# Patient Record
Sex: Female | Born: 1945 | Race: White | Hispanic: No | Marital: Married | Smoking: Never smoker
Health system: Southern US, Community
[De-identification: ages and names within clinical notes are randomized; demographics above are authoritative.]

## PROBLEM LIST (undated history)

## (undated) DIAGNOSIS — T8482XA Fibrosis due to internal orthopedic prosthetic devices, implants and grafts, initial encounter: Secondary | ICD-10-CM

## (undated) DIAGNOSIS — G4733 Obstructive sleep apnea (adult) (pediatric): Secondary | ICD-10-CM

## (undated) DIAGNOSIS — C801 Malignant (primary) neoplasm, unspecified: Secondary | ICD-10-CM

## (undated) DIAGNOSIS — Z7901 Long term (current) use of anticoagulants: Secondary | ICD-10-CM

## (undated) DIAGNOSIS — I259 Chronic ischemic heart disease, unspecified: Secondary | ICD-10-CM

## (undated) DIAGNOSIS — N184 Chronic kidney disease, stage 4 (severe): Secondary | ICD-10-CM

## (undated) DIAGNOSIS — R32 Unspecified urinary incontinence: Secondary | ICD-10-CM

## (undated) DIAGNOSIS — M8580 Other specified disorders of bone density and structure, unspecified site: Secondary | ICD-10-CM

## (undated) DIAGNOSIS — B369 Superficial mycosis, unspecified: Secondary | ICD-10-CM

## (undated) DIAGNOSIS — E785 Hyperlipidemia, unspecified: Secondary | ICD-10-CM

## (undated) DIAGNOSIS — R0600 Dyspnea, unspecified: Secondary | ICD-10-CM

## (undated) DIAGNOSIS — M542 Cervicalgia: Secondary | ICD-10-CM

## (undated) DIAGNOSIS — Z923 Personal history of irradiation: Secondary | ICD-10-CM

## (undated) DIAGNOSIS — C4491 Basal cell carcinoma of skin, unspecified: Secondary | ICD-10-CM

## (undated) DIAGNOSIS — R87610 Atypical squamous cells of undetermined significance on cytologic smear of cervix (ASC-US): Secondary | ICD-10-CM

## (undated) DIAGNOSIS — I447 Left bundle-branch block, unspecified: Secondary | ICD-10-CM

## (undated) DIAGNOSIS — A938 Other specified arthropod-borne viral fevers: Secondary | ICD-10-CM

## (undated) DIAGNOSIS — I700 Atherosclerosis of aorta: Secondary | ICD-10-CM

## (undated) DIAGNOSIS — G4730 Sleep apnea, unspecified: Secondary | ICD-10-CM

## (undated) DIAGNOSIS — E119 Type 2 diabetes mellitus without complications: Secondary | ICD-10-CM

## (undated) DIAGNOSIS — R001 Bradycardia, unspecified: Secondary | ICD-10-CM

## (undated) DIAGNOSIS — M1990 Unspecified osteoarthritis, unspecified site: Secondary | ICD-10-CM

## (undated) DIAGNOSIS — I2510 Atherosclerotic heart disease of native coronary artery without angina pectoris: Secondary | ICD-10-CM

## (undated) DIAGNOSIS — Z79811 Long term (current) use of aromatase inhibitors: Secondary | ICD-10-CM

## (undated) DIAGNOSIS — C50919 Malignant neoplasm of unspecified site of unspecified female breast: Secondary | ICD-10-CM

## (undated) DIAGNOSIS — I480 Paroxysmal atrial fibrillation: Secondary | ICD-10-CM

## (undated) DIAGNOSIS — I10 Essential (primary) hypertension: Secondary | ICD-10-CM

## (undated) HISTORY — PX: BREAST SURGERY: SHX581

## (undated) HISTORY — DX: Essential (primary) hypertension: I10

## (undated) HISTORY — DX: Type 2 diabetes mellitus without complications: E11.9

## (undated) HISTORY — DX: Malignant (primary) neoplasm, unspecified: C80.1

---

## 2010-03-16 HISTORY — PX: OTHER SURGICAL HISTORY: SHX169

## 2010-03-16 HISTORY — PX: CORONARY ANGIOPLASTY WITH STENT PLACEMENT: SHX49

## 2010-03-16 HISTORY — PX: CORONARY ANGIOPLASTY: SHX604

## 2015-04-04 DIAGNOSIS — N72 Inflammatory disease of cervix uteri: Secondary | ICD-10-CM | POA: Diagnosis not present

## 2015-04-04 DIAGNOSIS — B977 Papillomavirus as the cause of diseases classified elsewhere: Secondary | ICD-10-CM | POA: Diagnosis not present

## 2015-04-04 DIAGNOSIS — N882 Stricture and stenosis of cervix uteri: Secondary | ICD-10-CM | POA: Diagnosis not present

## 2015-04-04 DIAGNOSIS — R87810 Cervical high risk human papillomavirus (HPV) DNA test positive: Secondary | ICD-10-CM | POA: Diagnosis not present

## 2015-04-04 DIAGNOSIS — R87610 Atypical squamous cells of undetermined significance on cytologic smear of cervix (ASC-US): Secondary | ICD-10-CM | POA: Diagnosis not present

## 2015-04-15 DIAGNOSIS — G4733 Obstructive sleep apnea (adult) (pediatric): Secondary | ICD-10-CM | POA: Insufficient documentation

## 2015-04-15 DIAGNOSIS — E1122 Type 2 diabetes mellitus with diabetic chronic kidney disease: Secondary | ICD-10-CM

## 2015-04-15 DIAGNOSIS — E119 Type 2 diabetes mellitus without complications: Secondary | ICD-10-CM | POA: Insufficient documentation

## 2015-04-15 DIAGNOSIS — Z0000 Encounter for general adult medical examination without abnormal findings: Secondary | ICD-10-CM | POA: Diagnosis not present

## 2015-04-15 DIAGNOSIS — E7800 Pure hypercholesterolemia, unspecified: Secondary | ICD-10-CM | POA: Insufficient documentation

## 2015-04-15 DIAGNOSIS — I10 Essential (primary) hypertension: Secondary | ICD-10-CM | POA: Diagnosis not present

## 2015-04-15 DIAGNOSIS — I25119 Atherosclerotic heart disease of native coronary artery with unspecified angina pectoris: Secondary | ICD-10-CM | POA: Diagnosis not present

## 2015-04-15 DIAGNOSIS — M25562 Pain in left knee: Secondary | ICD-10-CM | POA: Diagnosis not present

## 2015-04-15 DIAGNOSIS — Z6841 Body Mass Index (BMI) 40.0 and over, adult: Secondary | ICD-10-CM | POA: Diagnosis not present

## 2015-04-18 DIAGNOSIS — E119 Type 2 diabetes mellitus without complications: Secondary | ICD-10-CM | POA: Diagnosis not present

## 2015-04-18 DIAGNOSIS — H5213 Myopia, bilateral: Secondary | ICD-10-CM | POA: Diagnosis not present

## 2015-04-18 DIAGNOSIS — H25013 Cortical age-related cataract, bilateral: Secondary | ICD-10-CM | POA: Diagnosis not present

## 2015-04-19 DIAGNOSIS — I10 Essential (primary) hypertension: Secondary | ICD-10-CM | POA: Diagnosis not present

## 2015-04-19 DIAGNOSIS — E7800 Pure hypercholesterolemia, unspecified: Secondary | ICD-10-CM | POA: Diagnosis not present

## 2015-04-19 DIAGNOSIS — E119 Type 2 diabetes mellitus without complications: Secondary | ICD-10-CM | POA: Diagnosis not present

## 2015-05-06 DIAGNOSIS — M1712 Unilateral primary osteoarthritis, left knee: Secondary | ICD-10-CM | POA: Diagnosis not present

## 2015-05-06 DIAGNOSIS — M25562 Pain in left knee: Secondary | ICD-10-CM | POA: Diagnosis not present

## 2015-06-12 DIAGNOSIS — G4733 Obstructive sleep apnea (adult) (pediatric): Secondary | ICD-10-CM | POA: Diagnosis not present

## 2015-08-13 DIAGNOSIS — E7800 Pure hypercholesterolemia, unspecified: Secondary | ICD-10-CM | POA: Diagnosis not present

## 2015-08-13 DIAGNOSIS — Z9989 Dependence on other enabling machines and devices: Secondary | ICD-10-CM | POA: Diagnosis not present

## 2015-08-13 DIAGNOSIS — G4733 Obstructive sleep apnea (adult) (pediatric): Secondary | ICD-10-CM | POA: Diagnosis not present

## 2015-08-13 DIAGNOSIS — I10 Essential (primary) hypertension: Secondary | ICD-10-CM | POA: Diagnosis not present

## 2015-08-13 DIAGNOSIS — E119 Type 2 diabetes mellitus without complications: Secondary | ICD-10-CM | POA: Diagnosis not present

## 2015-08-13 DIAGNOSIS — I25119 Atherosclerotic heart disease of native coronary artery with unspecified angina pectoris: Secondary | ICD-10-CM | POA: Diagnosis not present

## 2015-08-13 DIAGNOSIS — Z6841 Body Mass Index (BMI) 40.0 and over, adult: Secondary | ICD-10-CM | POA: Diagnosis not present

## 2015-08-20 ENCOUNTER — Other Ambulatory Visit: Payer: Self-pay | Admitting: Obstetrics and Gynecology

## 2015-08-20 DIAGNOSIS — Z1231 Encounter for screening mammogram for malignant neoplasm of breast: Secondary | ICD-10-CM

## 2015-08-20 DIAGNOSIS — N9089 Other specified noninflammatory disorders of vulva and perineum: Secondary | ICD-10-CM | POA: Diagnosis not present

## 2015-09-06 ENCOUNTER — Ambulatory Visit: Payer: Self-pay

## 2015-09-23 ENCOUNTER — Ambulatory Visit
Admission: RE | Admit: 2015-09-23 | Discharge: 2015-09-23 | Disposition: A | Discharge status: Home / Self Care | Payer: PPO | Source: Ambulatory Visit | Attending: Obstetrics and Gynecology | Admitting: Obstetrics and Gynecology

## 2015-09-23 ENCOUNTER — Other Ambulatory Visit: Payer: Self-pay | Admitting: Obstetrics and Gynecology

## 2015-09-23 DIAGNOSIS — Z1231 Encounter for screening mammogram for malignant neoplasm of breast: Secondary | ICD-10-CM

## 2015-09-24 DIAGNOSIS — R87810 Cervical high risk human papillomavirus (HPV) DNA test positive: Secondary | ICD-10-CM | POA: Diagnosis not present

## 2015-09-24 DIAGNOSIS — N9089 Other specified noninflammatory disorders of vulva and perineum: Secondary | ICD-10-CM | POA: Diagnosis not present

## 2015-11-26 DIAGNOSIS — L570 Actinic keratosis: Secondary | ICD-10-CM | POA: Diagnosis not present

## 2015-11-26 DIAGNOSIS — L821 Other seborrheic keratosis: Secondary | ICD-10-CM | POA: Diagnosis not present

## 2015-12-04 DIAGNOSIS — E7800 Pure hypercholesterolemia, unspecified: Secondary | ICD-10-CM | POA: Diagnosis not present

## 2015-12-04 DIAGNOSIS — E119 Type 2 diabetes mellitus without complications: Secondary | ICD-10-CM | POA: Diagnosis not present

## 2015-12-11 DIAGNOSIS — G4733 Obstructive sleep apnea (adult) (pediatric): Secondary | ICD-10-CM | POA: Diagnosis not present

## 2015-12-11 DIAGNOSIS — E7800 Pure hypercholesterolemia, unspecified: Secondary | ICD-10-CM | POA: Diagnosis not present

## 2015-12-11 DIAGNOSIS — I25119 Atherosclerotic heart disease of native coronary artery with unspecified angina pectoris: Secondary | ICD-10-CM | POA: Diagnosis not present

## 2015-12-11 DIAGNOSIS — E119 Type 2 diabetes mellitus without complications: Secondary | ICD-10-CM | POA: Diagnosis not present

## 2015-12-11 DIAGNOSIS — Z6841 Body Mass Index (BMI) 40.0 and over, adult: Secondary | ICD-10-CM | POA: Diagnosis not present

## 2015-12-11 DIAGNOSIS — Z9989 Dependence on other enabling machines and devices: Secondary | ICD-10-CM | POA: Diagnosis not present

## 2015-12-11 DIAGNOSIS — Z23 Encounter for immunization: Secondary | ICD-10-CM | POA: Diagnosis not present

## 2015-12-11 DIAGNOSIS — I10 Essential (primary) hypertension: Secondary | ICD-10-CM | POA: Diagnosis not present

## 2015-12-13 DIAGNOSIS — M1712 Unilateral primary osteoarthritis, left knee: Secondary | ICD-10-CM | POA: Diagnosis not present

## 2015-12-18 DIAGNOSIS — L570 Actinic keratosis: Secondary | ICD-10-CM | POA: Diagnosis not present

## 2015-12-18 DIAGNOSIS — X32XXXA Exposure to sunlight, initial encounter: Secondary | ICD-10-CM | POA: Diagnosis not present

## 2015-12-31 DIAGNOSIS — G4733 Obstructive sleep apnea (adult) (pediatric): Secondary | ICD-10-CM | POA: Diagnosis not present

## 2016-01-15 DIAGNOSIS — L570 Actinic keratosis: Secondary | ICD-10-CM | POA: Diagnosis not present

## 2016-01-15 DIAGNOSIS — X32XXXA Exposure to sunlight, initial encounter: Secondary | ICD-10-CM | POA: Diagnosis not present

## 2016-03-05 DIAGNOSIS — I10 Essential (primary) hypertension: Secondary | ICD-10-CM | POA: Diagnosis not present

## 2016-03-05 DIAGNOSIS — G4733 Obstructive sleep apnea (adult) (pediatric): Secondary | ICD-10-CM | POA: Diagnosis not present

## 2016-03-05 DIAGNOSIS — Z9989 Dependence on other enabling machines and devices: Secondary | ICD-10-CM | POA: Diagnosis not present

## 2016-04-20 DIAGNOSIS — G4733 Obstructive sleep apnea (adult) (pediatric): Secondary | ICD-10-CM | POA: Diagnosis not present

## 2016-04-23 DIAGNOSIS — E119 Type 2 diabetes mellitus without complications: Secondary | ICD-10-CM | POA: Diagnosis not present

## 2016-04-23 DIAGNOSIS — H04123 Dry eye syndrome of bilateral lacrimal glands: Secondary | ICD-10-CM | POA: Diagnosis not present

## 2016-04-23 DIAGNOSIS — E089 Diabetes mellitus due to underlying condition without complications: Secondary | ICD-10-CM | POA: Diagnosis not present

## 2016-04-23 DIAGNOSIS — H2513 Age-related nuclear cataract, bilateral: Secondary | ICD-10-CM | POA: Diagnosis not present

## 2016-04-28 DIAGNOSIS — D0461 Carcinoma in situ of skin of right upper limb, including shoulder: Secondary | ICD-10-CM | POA: Diagnosis not present

## 2016-04-28 DIAGNOSIS — L821 Other seborrheic keratosis: Secondary | ICD-10-CM | POA: Diagnosis not present

## 2016-04-28 DIAGNOSIS — I25119 Atherosclerotic heart disease of native coronary artery with unspecified angina pectoris: Secondary | ICD-10-CM | POA: Diagnosis not present

## 2016-04-28 DIAGNOSIS — D485 Neoplasm of uncertain behavior of skin: Secondary | ICD-10-CM | POA: Diagnosis not present

## 2016-04-28 DIAGNOSIS — E7800 Pure hypercholesterolemia, unspecified: Secondary | ICD-10-CM | POA: Diagnosis not present

## 2016-04-28 DIAGNOSIS — L570 Actinic keratosis: Secondary | ICD-10-CM | POA: Diagnosis not present

## 2016-04-28 DIAGNOSIS — E119 Type 2 diabetes mellitus without complications: Secondary | ICD-10-CM | POA: Diagnosis not present

## 2016-04-28 DIAGNOSIS — X32XXXA Exposure to sunlight, initial encounter: Secondary | ICD-10-CM | POA: Diagnosis not present

## 2016-05-05 DIAGNOSIS — E119 Type 2 diabetes mellitus without complications: Secondary | ICD-10-CM | POA: Diagnosis not present

## 2016-05-05 DIAGNOSIS — I10 Essential (primary) hypertension: Secondary | ICD-10-CM | POA: Diagnosis not present

## 2016-05-05 DIAGNOSIS — E7800 Pure hypercholesterolemia, unspecified: Secondary | ICD-10-CM | POA: Diagnosis not present

## 2016-05-05 DIAGNOSIS — G4733 Obstructive sleep apnea (adult) (pediatric): Secondary | ICD-10-CM | POA: Diagnosis not present

## 2016-05-05 DIAGNOSIS — Z9989 Dependence on other enabling machines and devices: Secondary | ICD-10-CM | POA: Diagnosis not present

## 2016-05-05 DIAGNOSIS — I25119 Atherosclerotic heart disease of native coronary artery with unspecified angina pectoris: Secondary | ICD-10-CM | POA: Diagnosis not present

## 2016-05-05 DIAGNOSIS — Z6841 Body Mass Index (BMI) 40.0 and over, adult: Secondary | ICD-10-CM | POA: Diagnosis not present

## 2016-05-09 DIAGNOSIS — G4733 Obstructive sleep apnea (adult) (pediatric): Secondary | ICD-10-CM | POA: Diagnosis not present

## 2016-05-11 DIAGNOSIS — D0461 Carcinoma in situ of skin of right upper limb, including shoulder: Secondary | ICD-10-CM | POA: Diagnosis not present

## 2016-05-11 DIAGNOSIS — C44622 Squamous cell carcinoma of skin of right upper limb, including shoulder: Secondary | ICD-10-CM | POA: Diagnosis not present

## 2016-06-09 DIAGNOSIS — G4733 Obstructive sleep apnea (adult) (pediatric): Secondary | ICD-10-CM | POA: Diagnosis not present

## 2016-06-09 DIAGNOSIS — I10 Essential (primary) hypertension: Secondary | ICD-10-CM | POA: Diagnosis not present

## 2016-07-09 DIAGNOSIS — G4733 Obstructive sleep apnea (adult) (pediatric): Secondary | ICD-10-CM | POA: Diagnosis not present

## 2016-07-09 DIAGNOSIS — I10 Essential (primary) hypertension: Secondary | ICD-10-CM | POA: Diagnosis not present

## 2016-07-10 DIAGNOSIS — Z9989 Dependence on other enabling machines and devices: Secondary | ICD-10-CM | POA: Diagnosis not present

## 2016-07-10 DIAGNOSIS — I10 Essential (primary) hypertension: Secondary | ICD-10-CM | POA: Diagnosis not present

## 2016-07-10 DIAGNOSIS — G4733 Obstructive sleep apnea (adult) (pediatric): Secondary | ICD-10-CM | POA: Diagnosis not present

## 2016-07-31 DIAGNOSIS — E119 Type 2 diabetes mellitus without complications: Secondary | ICD-10-CM | POA: Diagnosis not present

## 2016-07-31 DIAGNOSIS — N9089 Other specified noninflammatory disorders of vulva and perineum: Secondary | ICD-10-CM | POA: Diagnosis not present

## 2016-07-31 DIAGNOSIS — Z8739 Personal history of other diseases of the musculoskeletal system and connective tissue: Secondary | ICD-10-CM | POA: Diagnosis not present

## 2016-07-31 DIAGNOSIS — E7800 Pure hypercholesterolemia, unspecified: Secondary | ICD-10-CM | POA: Diagnosis not present

## 2016-07-31 DIAGNOSIS — I25119 Atherosclerotic heart disease of native coronary artery with unspecified angina pectoris: Secondary | ICD-10-CM | POA: Diagnosis not present

## 2016-07-31 DIAGNOSIS — L298 Other pruritus: Secondary | ICD-10-CM | POA: Diagnosis not present

## 2016-08-07 DIAGNOSIS — G4733 Obstructive sleep apnea (adult) (pediatric): Secondary | ICD-10-CM | POA: Diagnosis not present

## 2016-08-07 DIAGNOSIS — Z6841 Body Mass Index (BMI) 40.0 and over, adult: Secondary | ICD-10-CM | POA: Diagnosis not present

## 2016-08-07 DIAGNOSIS — E7800 Pure hypercholesterolemia, unspecified: Secondary | ICD-10-CM | POA: Diagnosis not present

## 2016-08-07 DIAGNOSIS — Z0000 Encounter for general adult medical examination without abnormal findings: Secondary | ICD-10-CM | POA: Diagnosis not present

## 2016-08-07 DIAGNOSIS — I25119 Atherosclerotic heart disease of native coronary artery with unspecified angina pectoris: Secondary | ICD-10-CM | POA: Diagnosis not present

## 2016-08-07 DIAGNOSIS — I10 Essential (primary) hypertension: Secondary | ICD-10-CM | POA: Diagnosis not present

## 2016-08-07 DIAGNOSIS — Z9989 Dependence on other enabling machines and devices: Secondary | ICD-10-CM | POA: Diagnosis not present

## 2016-08-07 DIAGNOSIS — E119 Type 2 diabetes mellitus without complications: Secondary | ICD-10-CM | POA: Diagnosis not present

## 2016-08-09 DIAGNOSIS — I10 Essential (primary) hypertension: Secondary | ICD-10-CM | POA: Diagnosis not present

## 2016-08-09 DIAGNOSIS — G4733 Obstructive sleep apnea (adult) (pediatric): Secondary | ICD-10-CM | POA: Diagnosis not present

## 2016-08-11 DIAGNOSIS — I10 Essential (primary) hypertension: Secondary | ICD-10-CM | POA: Diagnosis not present

## 2016-08-11 DIAGNOSIS — G4733 Obstructive sleep apnea (adult) (pediatric): Secondary | ICD-10-CM | POA: Diagnosis not present

## 2016-08-25 DIAGNOSIS — Z8739 Personal history of other diseases of the musculoskeletal system and connective tissue: Secondary | ICD-10-CM | POA: Diagnosis not present

## 2016-08-25 DIAGNOSIS — M8588 Other specified disorders of bone density and structure, other site: Secondary | ICD-10-CM | POA: Diagnosis not present

## 2016-09-09 DIAGNOSIS — I10 Essential (primary) hypertension: Secondary | ICD-10-CM | POA: Diagnosis not present

## 2016-09-09 DIAGNOSIS — G4733 Obstructive sleep apnea (adult) (pediatric): Secondary | ICD-10-CM | POA: Diagnosis not present

## 2016-09-10 DIAGNOSIS — G4733 Obstructive sleep apnea (adult) (pediatric): Secondary | ICD-10-CM | POA: Diagnosis not present

## 2016-09-10 DIAGNOSIS — I10 Essential (primary) hypertension: Secondary | ICD-10-CM | POA: Diagnosis not present

## 2016-09-30 ENCOUNTER — Other Ambulatory Visit: Payer: Self-pay | Admitting: Obstetrics and Gynecology

## 2016-09-30 DIAGNOSIS — Z1231 Encounter for screening mammogram for malignant neoplasm of breast: Secondary | ICD-10-CM

## 2016-10-08 DIAGNOSIS — Z85828 Personal history of other malignant neoplasm of skin: Secondary | ICD-10-CM | POA: Diagnosis not present

## 2016-10-08 DIAGNOSIS — D485 Neoplasm of uncertain behavior of skin: Secondary | ICD-10-CM | POA: Diagnosis not present

## 2016-10-08 DIAGNOSIS — D2261 Melanocytic nevi of right upper limb, including shoulder: Secondary | ICD-10-CM | POA: Diagnosis not present

## 2016-10-08 DIAGNOSIS — L570 Actinic keratosis: Secondary | ICD-10-CM | POA: Diagnosis not present

## 2016-10-08 DIAGNOSIS — L820 Inflamed seborrheic keratosis: Secondary | ICD-10-CM | POA: Diagnosis not present

## 2016-10-08 DIAGNOSIS — D2272 Melanocytic nevi of left lower limb, including hip: Secondary | ICD-10-CM | POA: Diagnosis not present

## 2016-10-08 DIAGNOSIS — X32XXXA Exposure to sunlight, initial encounter: Secondary | ICD-10-CM | POA: Diagnosis not present

## 2016-10-08 DIAGNOSIS — L821 Other seborrheic keratosis: Secondary | ICD-10-CM | POA: Diagnosis not present

## 2016-10-09 DIAGNOSIS — I10 Essential (primary) hypertension: Secondary | ICD-10-CM | POA: Diagnosis not present

## 2016-10-09 DIAGNOSIS — G4733 Obstructive sleep apnea (adult) (pediatric): Secondary | ICD-10-CM | POA: Diagnosis not present

## 2016-10-13 ENCOUNTER — Ambulatory Visit
Admission: RE | Admit: 2016-10-13 | Discharge: 2016-10-13 | Disposition: A | Discharge status: Home / Self Care | Payer: PPO | Source: Ambulatory Visit | Attending: Obstetrics and Gynecology | Admitting: Obstetrics and Gynecology

## 2016-10-13 DIAGNOSIS — Z1231 Encounter for screening mammogram for malignant neoplasm of breast: Secondary | ICD-10-CM | POA: Diagnosis not present

## 2016-10-13 DIAGNOSIS — R928 Other abnormal and inconclusive findings on diagnostic imaging of breast: Secondary | ICD-10-CM | POA: Insufficient documentation

## 2016-10-15 ENCOUNTER — Other Ambulatory Visit: Payer: Self-pay | Admitting: Obstetrics and Gynecology

## 2016-10-15 DIAGNOSIS — N6489 Other specified disorders of breast: Secondary | ICD-10-CM

## 2016-10-15 DIAGNOSIS — R928 Other abnormal and inconclusive findings on diagnostic imaging of breast: Secondary | ICD-10-CM

## 2016-10-29 DIAGNOSIS — M1712 Unilateral primary osteoarthritis, left knee: Secondary | ICD-10-CM | POA: Diagnosis not present

## 2016-10-30 ENCOUNTER — Other Ambulatory Visit: Payer: PPO

## 2016-10-30 ENCOUNTER — Other Ambulatory Visit: Payer: Self-pay | Admitting: Obstetrics and Gynecology

## 2016-10-30 ENCOUNTER — Ambulatory Visit: Payer: PPO | Attending: Obstetrics and Gynecology

## 2016-10-30 DIAGNOSIS — R928 Other abnormal and inconclusive findings on diagnostic imaging of breast: Secondary | ICD-10-CM

## 2016-10-30 DIAGNOSIS — N6489 Other specified disorders of breast: Secondary | ICD-10-CM

## 2016-11-05 DIAGNOSIS — I10 Essential (primary) hypertension: Secondary | ICD-10-CM | POA: Diagnosis not present

## 2016-11-05 DIAGNOSIS — G4733 Obstructive sleep apnea (adult) (pediatric): Secondary | ICD-10-CM | POA: Diagnosis not present

## 2016-11-09 DIAGNOSIS — I10 Essential (primary) hypertension: Secondary | ICD-10-CM | POA: Diagnosis not present

## 2016-11-09 DIAGNOSIS — G4733 Obstructive sleep apnea (adult) (pediatric): Secondary | ICD-10-CM | POA: Diagnosis not present

## 2016-11-10 ENCOUNTER — Ambulatory Visit
Admission: RE | Admit: 2016-11-10 | Discharge: 2016-11-10 | Disposition: A | Discharge status: Home / Self Care | Payer: PPO | Source: Ambulatory Visit | Attending: Obstetrics and Gynecology | Admitting: Obstetrics and Gynecology

## 2016-11-10 DIAGNOSIS — N6489 Other specified disorders of breast: Secondary | ICD-10-CM

## 2016-11-10 DIAGNOSIS — R928 Other abnormal and inconclusive findings on diagnostic imaging of breast: Secondary | ICD-10-CM

## 2016-11-10 DIAGNOSIS — R922 Inconclusive mammogram: Secondary | ICD-10-CM | POA: Diagnosis not present

## 2016-11-10 DIAGNOSIS — N6321 Unspecified lump in the left breast, upper outer quadrant: Secondary | ICD-10-CM | POA: Diagnosis not present

## 2016-11-12 ENCOUNTER — Other Ambulatory Visit: Payer: Self-pay | Admitting: Obstetrics and Gynecology

## 2016-11-12 DIAGNOSIS — N6320 Unspecified lump in the left breast, unspecified quadrant: Secondary | ICD-10-CM

## 2016-11-12 DIAGNOSIS — R928 Other abnormal and inconclusive findings on diagnostic imaging of breast: Secondary | ICD-10-CM

## 2016-11-14 DIAGNOSIS — C50919 Malignant neoplasm of unspecified site of unspecified female breast: Secondary | ICD-10-CM

## 2016-11-14 HISTORY — DX: Malignant neoplasm of unspecified site of unspecified female breast: C50.919

## 2016-11-19 ENCOUNTER — Ambulatory Visit
Admission: RE | Admit: 2016-11-19 | Discharge: 2016-11-19 | Disposition: A | Discharge status: Home / Self Care | Payer: PPO | Source: Ambulatory Visit | Attending: Obstetrics and Gynecology | Admitting: Obstetrics and Gynecology

## 2016-11-19 DIAGNOSIS — C50812 Malignant neoplasm of overlapping sites of left female breast: Secondary | ICD-10-CM | POA: Insufficient documentation

## 2016-11-19 DIAGNOSIS — R928 Other abnormal and inconclusive findings on diagnostic imaging of breast: Secondary | ICD-10-CM

## 2016-11-19 DIAGNOSIS — N6320 Unspecified lump in the left breast, unspecified quadrant: Secondary | ICD-10-CM

## 2016-11-19 DIAGNOSIS — N6323 Unspecified lump in the left breast, lower outer quadrant: Secondary | ICD-10-CM | POA: Diagnosis not present

## 2016-11-19 DIAGNOSIS — N6321 Unspecified lump in the left breast, upper outer quadrant: Secondary | ICD-10-CM | POA: Diagnosis not present

## 2016-11-19 DIAGNOSIS — C50912 Malignant neoplasm of unspecified site of left female breast: Secondary | ICD-10-CM

## 2016-11-19 DIAGNOSIS — C50412 Malignant neoplasm of upper-outer quadrant of left female breast: Secondary | ICD-10-CM | POA: Diagnosis not present

## 2016-11-19 HISTORY — PX: BREAST BIOPSY: SHX20

## 2016-11-19 HISTORY — DX: Malignant neoplasm of unspecified site of left female breast: C50.912

## 2016-11-24 LAB — SURGICAL PATHOLOGY

## 2016-11-26 ENCOUNTER — Other Ambulatory Visit: Payer: Self-pay | Admitting: Obstetrics and Gynecology

## 2016-11-26 DIAGNOSIS — R928 Other abnormal and inconclusive findings on diagnostic imaging of breast: Secondary | ICD-10-CM

## 2016-11-26 DIAGNOSIS — R590 Localized enlarged lymph nodes: Secondary | ICD-10-CM | POA: Diagnosis not present

## 2016-11-26 NOTE — Progress Notes (Signed)
  Oncology Nurse Navigator Documentation  Navigator Location: [facility] (11/26/16 1700)   )Navigator Encounter Type: Introductory phone call (11/26/16 1700)   Abnormal Finding Date: 11/10/16 (11/26/16 1700) Confirmed Diagnosis Date: 11/19/16 (11/26/16 1700)               Patient Visit Type: Initial (11/26/16 1700)   Barriers/Navigation Needs: Coordination of Care;Education (11/26/16 1700) Education: Accessing Care/ Finding Providers;Coping with Diagnosis/ Prognosis (11/26/16 1700) Interventions: Education;Coordination of Care (11/26/16 1700)                      Time Spent with Patient: 30 (11/26/16 1700)   Introduced IT trainer.  Patient to be scheduled with [facility] Surgery, and Med/ Onc consult . Scheduling request placed.

## 2016-11-30 NOTE — Progress Notes (Signed)
  Oncology Nurse Navigator Documentation      )Navigator Encounter Type: Telephone (11/30/16 1300) Telephone: Appt Confirmation/Clarification (11/30/16 1300)                     Treatment Phase: Pre-Tx/Tx Discussion (11/30/16 1300) Barriers/Navigation Needs: Coordination of Care (11/30/16 1300) Education: Accessing Care/ Finding Providers (11/30/16 1300) Interventions: Coordination of Care (11/30/16 1300)   Coordination of Care: Appts (11/30/16 1300)                  Time Spent with Patient: 30 (11/30/16 1300)   Confirmed surgical consult, and Med/Onc appointments with patient.

## 2016-12-02 DIAGNOSIS — E7800 Pure hypercholesterolemia, unspecified: Secondary | ICD-10-CM | POA: Diagnosis not present

## 2016-12-02 DIAGNOSIS — I25119 Atherosclerotic heart disease of native coronary artery with unspecified angina pectoris: Secondary | ICD-10-CM | POA: Diagnosis not present

## 2016-12-02 DIAGNOSIS — I10 Essential (primary) hypertension: Secondary | ICD-10-CM | POA: Diagnosis not present

## 2016-12-02 DIAGNOSIS — E119 Type 2 diabetes mellitus without complications: Secondary | ICD-10-CM | POA: Diagnosis not present

## 2016-12-03 ENCOUNTER — Other Ambulatory Visit: Payer: Self-pay

## 2016-12-03 ENCOUNTER — Ambulatory Visit
Admission: RE | Admit: 2016-12-03 | Discharge: 2016-12-03 | Disposition: A | Discharge status: Home / Self Care | Payer: PPO | Source: Ambulatory Visit | Attending: Obstetrics and Gynecology | Admitting: Obstetrics and Gynecology

## 2016-12-03 DIAGNOSIS — R590 Localized enlarged lymph nodes: Secondary | ICD-10-CM | POA: Insufficient documentation

## 2016-12-03 DIAGNOSIS — R928 Other abnormal and inconclusive findings on diagnostic imaging of breast: Secondary | ICD-10-CM

## 2016-12-03 HISTORY — PX: BREAST BIOPSY: SHX20

## 2016-12-04 LAB — SURGICAL PATHOLOGY

## 2016-12-06 DIAGNOSIS — C50412 Malignant neoplasm of upper-outer quadrant of left female breast: Secondary | ICD-10-CM | POA: Insufficient documentation
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
## 2016-12-07 ENCOUNTER — Inpatient Hospital Stay: Payer: PPO | Attending: Oncology | Admitting: Oncology

## 2016-12-07 ENCOUNTER — Encounter: Payer: Self-pay | Admitting: Oncology

## 2016-12-07 ENCOUNTER — Encounter (INDEPENDENT_AMBULATORY_CARE_PROVIDER_SITE_OTHER): Payer: Self-pay

## 2016-12-07 DIAGNOSIS — G4733 Obstructive sleep apnea (adult) (pediatric): Secondary | ICD-10-CM | POA: Insufficient documentation

## 2016-12-07 DIAGNOSIS — Z79899 Other long term (current) drug therapy: Secondary | ICD-10-CM | POA: Insufficient documentation

## 2016-12-07 DIAGNOSIS — I2510 Atherosclerotic heart disease of native coronary artery without angina pectoris: Secondary | ICD-10-CM | POA: Diagnosis not present

## 2016-12-07 DIAGNOSIS — Z7982 Long term (current) use of aspirin: Secondary | ICD-10-CM | POA: Insufficient documentation

## 2016-12-07 DIAGNOSIS — Z170 Estrogen receptor positive status [ER+]: Secondary | ICD-10-CM | POA: Insufficient documentation

## 2016-12-07 DIAGNOSIS — C50412 Malignant neoplasm of upper-outer quadrant of left female breast: Secondary | ICD-10-CM | POA: Diagnosis not present

## 2016-12-07 DIAGNOSIS — E119 Type 2 diabetes mellitus without complications: Secondary | ICD-10-CM | POA: Insufficient documentation

## 2016-12-07 DIAGNOSIS — I10 Essential (primary) hypertension: Secondary | ICD-10-CM | POA: Diagnosis not present

## 2016-12-07 DIAGNOSIS — Z7984 Long term (current) use of oral hypoglycemic drugs: Secondary | ICD-10-CM | POA: Diagnosis not present

## 2016-12-08 ENCOUNTER — Encounter: Payer: Self-pay | Admitting: General Surgery

## 2016-12-08 ENCOUNTER — Ambulatory Visit: Payer: Self-pay | Admitting: General Surgery

## 2016-12-08 DIAGNOSIS — Z170 Estrogen receptor positive status [ER+]: Secondary | ICD-10-CM | POA: Diagnosis not present

## 2016-12-08 DIAGNOSIS — I2510 Atherosclerotic heart disease of native coronary artery without angina pectoris: Secondary | ICD-10-CM | POA: Diagnosis not present

## 2016-12-08 DIAGNOSIS — C50412 Malignant neoplasm of upper-outer quadrant of left female breast: Secondary | ICD-10-CM | POA: Diagnosis not present

## 2016-12-08 NOTE — H&P (Signed)
PATIENT PROFILE: Bonnie Holt is a 71 y.o. female who presents to the Clinic for consultation at the request of Dr. Dalbert Garnet for evaluation of breast carcinoma.  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 The patient denies previous palpable masses, no nipple discharge, no skin changes. Patient denies previous biopsy. Denies any previous pregnancy. Menarche at 71 years old, Menopause at 71 years old. No history of chest radiation, no hormone therapy and no first or second relative breast cancer history. Patient already seen oncologist and was oriented about diagnosis and therapeutic alternatives.    PROBLEM LIST: Problem List  Date Reviewed: 12/08/2016         Noted   Type 2 diabetes mellitus without complication (CMS-HCC) 04/15/2015   HTN, goal below 140/90 04/15/2015   Overview    White coat component noted.       Pure hypercholesterolemia 04/15/2015   OSA on CPAP 04/15/2015   Coronary artery disease involving native coronary artery 04/15/2015   Overview    Post stent      Health care maintenance 04/15/2015   Overview    Colon 8-14, due 8-19, gyn with abnl paps, mmgs yearly, flu yearly, tdap 9-17, pneumovax distant and prevnar11-16 , zostavax obtained.   MEDICARE WELLNESS VISIT   PROVIDERS RENDERING CARE Dr. Dareen Piano, Dr. Dalbert Garnet in [facility]  FUNCTIONAL ASSESSMENT  (1) Hearing: Demonstrates normal hearing in conversation.  (2) Risk of Falls: No reports of falls or abnormal balance. Gait is observed to be good upon observation.  (3) Home Safety; Home is safe and secure (4) Activities of Daily Living; Household chores and grooming are managed without problems. Personal finances are managed without  problems.   DEPRESSION SCREENING There does not seem to be loss of interest in activities nor excess crying or changes in sleep or appetite.   COGNITIVE SCREENING Orientation is appropriate as are responses to questions and general conversation. No reports of forgetfulness or losing things.    PREVENTION PLAN Cardiovascular: Followed closely on cholesterol meds Diabetes: followed closely with disease Mammogram: Yearly mammogram Bone Density: Vitamin d Colon Cancer: Last colonoscopy 8-14  Glaucoma: Yearly eye exam and for DM also Pneumonia: Pneumovax in the past and prevnar 13 in 2016 Shingles; History of Zostavax Influenza: Flu vaccine each fall Smoking Cessation: NA   OTHER PERSONALIZED HEALTH ADVISE Walking as feasible and diabetic diet.   END OF LIFE CARE WANTS Full Code     Einar Crow MD             ASCUS with positive high risk HPV cervical Unknown   Mild dysplasia of cervix Unknown   Vulvitis Unknown   Osteopenia Unknown   Morbid obesity with body mass index of 40.0-44.9 in adult (CMS-HCC) Unknown      GENERAL REVIEW OF SYSTEMS:   General ROS: negative for - chills, fatigue, fever, weight gain or weight loss Allergy and Immunology ROS: negative for - hives  Hematological and Lymphatic ROS: negative for - bleeding problems or bruising, negative for palpable nodes Endocrine ROS: negative for - heat or cold intolerance, hair changes Respiratory ROS: negative for - cough, shortness of breath or wheezing Cardiovascular ROS: no chest pain or palpitations GI ROS: negative for nausea, vomiting, abdominal pain,  diarrhea, constipation Musculoskeletal ROS: positive for back pain Neurological ROS: negative for - confusion, syncope Dermatological ROS: negative for pruritus and rash  MEDICATIONS: Current Outpatient Prescriptions  Medication Sig Dispense Refill  . alendronate (FOSAMAX) 70 MG tablet Take 70 mg by mouth every 7 (seven) days. Take with a full  glass of water. Do not lie down for the next 30 min.    Marland Kitchen aspirin 81 MG EC tablet Take 81 mg by mouth once daily.    Marland Kitchen CALCIUM CITRATE/VITAMIN D3 (CITRACAL + D MAXIMUM ORAL) Take by mouth.    Marland Kitchen CINNAMON BARK (CINNAMON ORAL) Take by mouth.    . clopidogrel (PLAVIX) 75 mg tablet TAKE 1 TABLET BY MOUTH ONCE DAILY 90 tablet 1  . glipiZIDE (GLUCOTROL XL) 5 MG XL tablet Take 1 tablet (5 mg total) by mouth once daily. 90 tablet 1  . lancing device with lancets kit Use 1 each 3 (three) times daily. Use as instructed.    Marland Kitchen losartan (COZAAR) 50 MG tablet Take 1 tablet (50 mg total) by mouth once daily. 90 tablet 1  . metFORMIN (GLUCOPHAGE) 1000 MG tablet Take 1 tablet (1,000 mg total) by mouth 2 (two) times daily with meals. 180 tablet 1  . metoprolol succinate (TOPROL-XL) 50 MG XL tablet Take 1 tablet (50 mg total) by mouth once daily. 90 tablet 1  . MULTIVITAMIN ORAL Take by mouth.    Letta Pate DELICA LANCETS 33 gauge Misc USE TWICE DAILY AS INSTRUCTED 100 each 2  . ONETOUCH ULTRA TEST test strip USE TWICE DAILY AS DIRECTED 200 each 3  . pravastatin (PRAVACHOL) 40 MG tablet Take 1 tablet (40 mg total) by mouth once daily. 90 tablet 1  . SF 1.1 % gel   99  . terbinafine HCl (LAMISIL) 250 mg tablet Take 1 tablet (250 mg total) by mouth once daily. 90 tablet 1   No current facility-administered medications for this visit.     ALLERGIES: Ace inhibitors  PAST MEDICAL HISTORY: Past Medical History:  Diagnosis Date  . Abnormal screening cardiac CT   . Arthritis of knee   . ASCUS with positive high risk HPV cervical   . Cervicalgia   . Chronic ischemic heart disease   . Chronic osteoarthritis   . Cough   . Dermatomycosis   . Diabetes mellitus type 2, uncomplicated (CMS-HCC)   . Dyspnea and respiratory abnormalities, unspecified   . Hyperlipidemia, unspecified, unspecified   . Hypertension   . Malaise and fatigue   . Mild dysplasia of cervix   . Morbid obesity with body mass index of  40.0-44.9 in adult (CMS-HCC)   . Obstructive sleep apnea   . Osteopenia   . Urinary incontinence, mixed   . Vulvitis     PAST SURGICAL HISTORY: Past Surgical History:  Procedure Laterality Date  . CERVICAL BIOPSY  W/ LOOP ELECTRODE EXCISION  12/24/2010  . COLPOSCOPY  07/31/2014   Mild Dysplasia  . CORONARY ANGIOPLASTY WITH STENT PLACEMENT  June 2012  . Cryosurgery of Cervix  09/03/2014     FAMILY HISTORY: Family History  Problem Relation Age of Onset  . Lung cancer Father   . Brain cancer Father   . Dementia Maternal Grandmother   . High blood pressure (Hypertension) Brother   . Obesity Brother      SOCIAL HISTORY: Social History   Social History  . Marital status: Married    Spouse name: N/A  . Number of children: N/A  . Years of education:  N/A   Social History Main Topics  . Smoking status: Never Smoker  . Smokeless tobacco: Never Used     Comment: never used  . Alcohol use No  . Drug use: No  . Sexual activity: Not Currently    Partners: Male    Birth control/ protection: None   Other Topics Concern  . None   Social History Narrative  . None    PHYSICAL EXAM: Vitals:   12/08/16 0754  BP: 164/74  Pulse: 66  Temp: 36.8 C (98.2 F)   Body mass index is 42.98 kg/m. Weight: (!) 106.6 kg (235 lb)   General Appearance:    Alert, cooperative, no distress, appears stated age  Head:     Atraumatic, normocephalic  Eyes:   Anciteric, no erythema, no secretions  Neck:   Supple, symmetrical, no JVD, no palpable lymph nodes  Mouth:   Lips, mucosa, and tongue normal;   Lungs:     Clear to auscultation bilaterally, respirations unlabored   Heart:    Regular rate and rhythm, S1 and S2 normal, no murmur, rub   or gallop  Abdomen:     Soft, non-tender, bowel sounds active all four quadrants,    no masses, no organomegaly  Extremities:   Extremities normal, atraumatic, no cyanosis or edema  Skin:   Skin color, texture, turgor normal, no rashes or lesions    Neurologic: Breast:   Grossly intact. No palpable masses, no nipple retraction, no skin changes bilaterally. No axillary nodes palpable bilaterally. Breast examination was done sitting and flat.    REVIEW OF DATA: I have reviewed the following data today: Appointment on 12/02/2016  Component Date Value  . Glucose 12/02/2016 157*  . Sodium 12/02/2016 141   . Potassium 12/02/2016 4.5   . Chloride 12/02/2016 104   . Carbon Dioxide (CO2) 12/02/2016 26.6   . Calcium 12/02/2016 10.2   . Urea Nitrogen (BUN) 12/02/2016 23   . Creatinine 12/02/2016 1.0   . Glomerular Filtration Ra* 12/02/2016 55*  . BUN/Crea Ratio 12/02/2016 23.0*  . Anion Gap w/K 12/02/2016 14.9   . Hemoglobin A1C 12/02/2016 6.8*  . Average Blood Glucose (C* 12/02/2016 148   . Protein, Total 12/02/2016 6.8   . Albumin 12/02/2016 4.3   . Bilirubin, Total 12/02/2016 0.5   . Bilirubin, Conjugated 12/02/2016 0.10   . Alk Phos (alkaline Phosp* 12/02/2016 67   . AST  12/02/2016 20   . ALT  12/02/2016 22   . Cholesterol, Total 12/02/2016 173   . Triglyceride 12/02/2016 136   . HDL (High Density Lipopr* 48/18/5909 40.7   . LDL (Low Density Lipopro* 31/02/1623 105   . VLDL Cholesterol 12/02/2016 27   . Cholesterol/HDL Ratio 12/02/2016 4.3   . Urine Albumin, Random 12/02/2016 <7      ASSESSMENT: Ms. Escutia is a 71 y.o. female presenting for consultation for breast carcinoma. The patient was found with 3 subcentimeter lesion of the left breast. 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 A subcentimeter lesion identified on sonogram of the left axilla was negative for metastasis.   Patient has history of coronary artery stent currently on aspirin and plavix. Patient was oriented about surgical alternatives (partial mastectomy vs total mastectomy both with sentinel lymph nodes biopsies). Oriented about the procedure, benefits and risks. Patient  refers that agrees to continue with partial mastectomy with SLNBx.   PLAN: 1. Partial mastectomy with sentinel lymph  node biopsy 2. Needs Internal Medicine clearance 3. Pre op work up includes Labs (CBC, CMP, Coagulation profile), Chest xray, EKG  Patient and/or representative verbalized understanding, all questions were answered, and were agreeable with the plan outlined above.    Herbert Pun, MD  Electronically signed by Herbert Pun, MD

## 2016-12-09 ENCOUNTER — Other Ambulatory Visit: Payer: Self-pay | Admitting: General Surgery

## 2016-12-09 DIAGNOSIS — E7800 Pure hypercholesterolemia, unspecified: Secondary | ICD-10-CM | POA: Diagnosis not present

## 2016-12-09 DIAGNOSIS — Z23 Encounter for immunization: Secondary | ICD-10-CM | POA: Diagnosis not present

## 2016-12-09 DIAGNOSIS — Z9989 Dependence on other enabling machines and devices: Secondary | ICD-10-CM | POA: Diagnosis not present

## 2016-12-09 DIAGNOSIS — E119 Type 2 diabetes mellitus without complications: Secondary | ICD-10-CM | POA: Diagnosis not present

## 2016-12-09 DIAGNOSIS — I25119 Atherosclerotic heart disease of native coronary artery with unspecified angina pectoris: Secondary | ICD-10-CM | POA: Diagnosis not present

## 2016-12-09 DIAGNOSIS — C50412 Malignant neoplasm of upper-outer quadrant of left female breast: Secondary | ICD-10-CM

## 2016-12-09 DIAGNOSIS — Z6841 Body Mass Index (BMI) 40.0 and over, adult: Secondary | ICD-10-CM | POA: Diagnosis not present

## 2016-12-09 DIAGNOSIS — Z170 Estrogen receptor positive status [ER+]: Principal | ICD-10-CM

## 2016-12-09 DIAGNOSIS — C50912 Malignant neoplasm of unspecified site of left female breast: Secondary | ICD-10-CM | POA: Diagnosis not present

## 2016-12-09 DIAGNOSIS — I10 Essential (primary) hypertension: Secondary | ICD-10-CM | POA: Diagnosis not present

## 2016-12-09 DIAGNOSIS — G4733 Obstructive sleep apnea (adult) (pediatric): Secondary | ICD-10-CM | POA: Diagnosis not present

## 2016-12-10 ENCOUNTER — Encounter
Admission: RE | Admit: 2016-12-10 | Discharge: 2016-12-10 | Disposition: A | Discharge status: Home / Self Care | Payer: PPO | Source: Ambulatory Visit | Attending: General Surgery | Admitting: General Surgery

## 2016-12-10 ENCOUNTER — Other Ambulatory Visit: Payer: Self-pay | Admitting: General Surgery

## 2016-12-10 DIAGNOSIS — G4733 Obstructive sleep apnea (adult) (pediatric): Secondary | ICD-10-CM | POA: Diagnosis not present

## 2016-12-10 DIAGNOSIS — Z170 Estrogen receptor positive status [ER+]: Principal | ICD-10-CM

## 2016-12-10 DIAGNOSIS — R9431 Abnormal electrocardiogram [ECG] [EKG]: Secondary | ICD-10-CM | POA: Insufficient documentation

## 2016-12-10 DIAGNOSIS — I2510 Atherosclerotic heart disease of native coronary artery without angina pectoris: Secondary | ICD-10-CM | POA: Insufficient documentation

## 2016-12-10 DIAGNOSIS — I10 Essential (primary) hypertension: Secondary | ICD-10-CM | POA: Diagnosis not present

## 2016-12-10 DIAGNOSIS — I447 Left bundle-branch block, unspecified: Secondary | ICD-10-CM | POA: Diagnosis not present

## 2016-12-10 DIAGNOSIS — C50412 Malignant neoplasm of upper-outer quadrant of left female breast: Secondary | ICD-10-CM

## 2016-12-10 HISTORY — DX: Sleep apnea, unspecified: G47.30

## 2016-12-10 HISTORY — DX: Unspecified osteoarthritis, unspecified site: M19.90

## 2016-12-10 HISTORY — DX: Atherosclerotic heart disease of native coronary artery without angina pectoris: I25.10

## 2016-12-10 NOTE — Pre-Procedure Instructions (Signed)
Cardiac clearance faxed and called to  Dr. Windell Moment office  (spoke to [place] ).

## 2016-12-10 NOTE — Pre-Procedure Instructions (Signed)
After speaking to Dr. Marcello Moores regarding anesthesia consult , pre-op EKG performed and showed LBBB. Dr. Marcello Moores requested a cardiac clearance.

## 2016-12-10 NOTE — Pre-Procedure Instructions (Signed)
Dr. Marcello Moores made aware of anesthesia consult and medical history and stated patient should be okay for surgery.

## 2016-12-10 NOTE — Patient Instructions (Signed)
Your procedure is scheduled on: December 16, 2016 [facility])  Report to [place] ARRIVAL TIME 11:15 am  Incompletely may result in serious medical risk, up to and including death, or upon the discretion of your surgeon and anesthesiologist your surgery may need to be rescheduled.    _x___ 1. Do not eat food after midnight the night before your procedure. You may drink clear liquids up to 2 hours before you are scheduled to arrive at the hospital for your procedure.  Do not drink clear liquids within 2 hours of your scheduled arrival to the hospital.  Clear liquids include  --Water or Apple juice without pulp  --Clear carbohydrate beverage such as ClearFast or Gatorade  --Black Coffee or Clear Tea (No milk, no creamers, do not add anything to                  the coffee or Tea Type 1 and type 2 diabetics should only drink water.  No gum chewing or hard candies.     __x__ 2. No Alcohol for 24 hours before or after surgery.   __x__3. No Smoking for 24 prior to surgery.   ____  4. Bring all medications with you on the day of surgery if instructed.    __x__ 5. Notify your doctor if there is any change in your medical condition     (cold, fever, infections).     Do not wear jewelry, make-up, hairpins, clips or nail polish.  Do not wear lotions, powders, or perfumes.   Do not shave 48 hours prior to surgery. Men may shave face and neck.  Do not bring valuables to the hospital.    [facility] is not responsible for any belongings or valuables.               Contacts, dentures or bridgework may not be worn into surgery.  Leave your suitcase in the car. After surgery it may be brought to your room.  For patients admitted to the hospital, discharge time is determined by your treatment team                    Patients discharged the day of surgery will not be allowed to drive home.  You will need someone to drive you home and stay with you the night of your procedure.    Please  read over the following fact sheets that you were given:   [facility] Preparing for Surgery and or MRSA Information   TAKE THE FOLLOWING MEDICATIONS WITH A SIP OF WATER THE MORNING OF SURGERY :  1. METOPROLOL  2. MAGNESIUM    ____Fleets enema or Magnesium Citrate as directed.   _x___ Use CHG Soap or sage wipes as directed on instruction sheet   ____ Use inhalers on the day of surgery and bring to hospital day of surgery  _X___ Stop Metformin and Janumet 2 days prior to surgery. (STOP METFORMIN ON OCTOBER  1  )   ____ Take 1/2 of usual insulin dose the night before surgery and none on the morning     surgery.   _x___ Follow recommendations from Cardiologist, Pulmonologist or PCP regarding          stopping Aspirin, Coumadin, Plavix ,Eliquis, Effient, or Pradaxa, and Pletal. (PATIENT STOPPED ASPIRIN AND PLAVIX ON SEPTEMBER  26 )  X____Stop Anti-inflammatories such as Advil, Aleve, Ibuprofen, Motrin, Naproxen, Naprosyn, Goodies powders or aspirin products. OK to take Tylenol .  _x___ Stop supplements until after surgery.  But may continue Vitamin D, Vitamin B, and multivitamin       (STOP CHROMIUM-CINNAMON, CO Q 10, AND FLAXSEED NOW )   __X__ Bring C-Pap to the hospital. (BRING BI-PAP [place] )

## 2016-12-11 DIAGNOSIS — E782 Mixed hyperlipidemia: Secondary | ICD-10-CM | POA: Diagnosis not present

## 2016-12-11 DIAGNOSIS — M1990 Unspecified osteoarthritis, unspecified site: Secondary | ICD-10-CM | POA: Diagnosis not present

## 2016-12-11 DIAGNOSIS — I2510 Atherosclerotic heart disease of native coronary artery without angina pectoris: Secondary | ICD-10-CM | POA: Diagnosis not present

## 2016-12-11 DIAGNOSIS — Z01818 Encounter for other preprocedural examination: Secondary | ICD-10-CM | POA: Diagnosis not present

## 2016-12-11 DIAGNOSIS — E119 Type 2 diabetes mellitus without complications: Secondary | ICD-10-CM | POA: Diagnosis not present

## 2016-12-11 DIAGNOSIS — Z955 Presence of coronary angioplasty implant and graft: Secondary | ICD-10-CM | POA: Diagnosis not present

## 2016-12-11 DIAGNOSIS — R9431 Abnormal electrocardiogram [ECG] [EKG]: Secondary | ICD-10-CM | POA: Diagnosis not present

## 2016-12-11 DIAGNOSIS — I10 Essential (primary) hypertension: Secondary | ICD-10-CM | POA: Diagnosis not present

## 2016-12-15 MED ORDER — CEFAZOLIN SODIUM-DEXTROSE 2-4 GM/100ML-% IV SOLN
2.0000 g | INTRAVENOUS | Status: AC
  Administered 2016-12-16: 2 g via INTRAVENOUS

## 2016-12-15 NOTE — Pre-Procedure Instructions (Signed)
CLEARED BY DR Clayborn Bigness 12/15/16

## 2016-12-16 ENCOUNTER — Ambulatory Visit: Payer: PPO | Admitting: Registered Nurse

## 2016-12-16 ENCOUNTER — Encounter
Admission: RE | Disposition: A | Discharge status: Home / Self Care | Payer: Self-pay | Source: Ambulatory Visit | Attending: General Surgery

## 2016-12-16 ENCOUNTER — Ambulatory Visit
Admission: RE | Admit: 2016-12-16 | Discharge: 2016-12-16 | Disposition: A | Discharge status: Home / Self Care | Payer: PPO | Source: Ambulatory Visit | Attending: General Surgery | Admitting: General Surgery

## 2016-12-16 ENCOUNTER — Encounter: Payer: Self-pay | Admitting: *Deleted

## 2016-12-16 ENCOUNTER — Encounter
Admission: RE | Admit: 2016-12-16 | Discharge: 2016-12-16 | Disposition: A | Discharge status: Home / Self Care | Payer: PPO | Source: Ambulatory Visit | Attending: General Surgery | Admitting: General Surgery

## 2016-12-16 DIAGNOSIS — C50912 Malignant neoplasm of unspecified site of left female breast: Secondary | ICD-10-CM | POA: Diagnosis not present

## 2016-12-16 DIAGNOSIS — E119 Type 2 diabetes mellitus without complications: Secondary | ICD-10-CM | POA: Insufficient documentation

## 2016-12-16 DIAGNOSIS — M1710 Unilateral primary osteoarthritis, unspecified knee: Secondary | ICD-10-CM | POA: Insufficient documentation

## 2016-12-16 DIAGNOSIS — C50412 Malignant neoplasm of upper-outer quadrant of left female breast: Secondary | ICD-10-CM | POA: Diagnosis not present

## 2016-12-16 DIAGNOSIS — I2510 Atherosclerotic heart disease of native coronary artery without angina pectoris: Secondary | ICD-10-CM | POA: Diagnosis not present

## 2016-12-16 DIAGNOSIS — Z6841 Body Mass Index (BMI) 40.0 and over, adult: Secondary | ICD-10-CM | POA: Insufficient documentation

## 2016-12-16 DIAGNOSIS — Z170 Estrogen receptor positive status [ER+]: Principal | ICD-10-CM

## 2016-12-16 DIAGNOSIS — Z9989 Dependence on other enabling machines and devices: Secondary | ICD-10-CM | POA: Diagnosis not present

## 2016-12-16 DIAGNOSIS — Z7984 Long term (current) use of oral hypoglycemic drugs: Secondary | ICD-10-CM | POA: Insufficient documentation

## 2016-12-16 DIAGNOSIS — G4733 Obstructive sleep apnea (adult) (pediatric): Secondary | ICD-10-CM | POA: Diagnosis not present

## 2016-12-16 DIAGNOSIS — Z955 Presence of coronary angioplasty implant and graft: Secondary | ICD-10-CM | POA: Diagnosis not present

## 2016-12-16 DIAGNOSIS — I10 Essential (primary) hypertension: Secondary | ICD-10-CM | POA: Diagnosis not present

## 2016-12-16 DIAGNOSIS — Z85828 Personal history of other malignant neoplasm of skin: Secondary | ICD-10-CM | POA: Insufficient documentation

## 2016-12-16 DIAGNOSIS — D242 Benign neoplasm of left breast: Secondary | ICD-10-CM | POA: Diagnosis not present

## 2016-12-16 DIAGNOSIS — Z79899 Other long term (current) drug therapy: Secondary | ICD-10-CM | POA: Diagnosis not present

## 2016-12-16 DIAGNOSIS — E7800 Pure hypercholesterolemia, unspecified: Secondary | ICD-10-CM | POA: Insufficient documentation

## 2016-12-16 DIAGNOSIS — M8580 Other specified disorders of bone density and structure, unspecified site: Secondary | ICD-10-CM | POA: Diagnosis not present

## 2016-12-16 DIAGNOSIS — C50812 Malignant neoplasm of overlapping sites of left female breast: Secondary | ICD-10-CM | POA: Diagnosis not present

## 2016-12-16 HISTORY — PX: SENTINEL NODE BIOPSY: SHX6608

## 2016-12-16 HISTORY — PX: BREAST LUMPECTOMY: SHX2

## 2016-12-16 HISTORY — PX: PARTIAL MASTECTOMY WITH NEEDLE LOCALIZATION: SHX6008

## 2016-12-16 LAB — GLUCOSE, CAPILLARY: Glucose-Capillary: 165 mg/dL — ABNORMAL HIGH (ref 65–99)

## 2016-12-16 SURGERY — PARTIAL MASTECTOMY WITH NEEDLE LOCALIZATION
Anesthesia: General | Site: Breast | Laterality: Left | Wound class: Clean

## 2016-12-16 MED ORDER — FENTANYL CITRATE (PF) 100 MCG/2ML IJ SOLN
INTRAMUSCULAR | Status: DC | PRN
  Administered 2016-12-16 (×2): 50 ug via INTRAVENOUS

## 2016-12-16 MED ORDER — DEXAMETHASONE SODIUM PHOSPHATE 10 MG/ML IJ SOLN
INTRAMUSCULAR | Status: DC | PRN
  Administered 2016-12-16: 5 mg via INTRAVENOUS

## 2016-12-16 MED ORDER — KETOROLAC TROMETHAMINE 30 MG/ML IJ SOLN
INTRAMUSCULAR | Status: AC
  Filled 2016-12-16: qty 1

## 2016-12-16 MED ORDER — ONDANSETRON HCL 4 MG/2ML IJ SOLN
INTRAMUSCULAR | Status: AC
  Filled 2016-12-16: qty 2

## 2016-12-16 MED ORDER — DEXAMETHASONE SODIUM PHOSPHATE 10 MG/ML IJ SOLN
INTRAMUSCULAR | Status: AC
  Filled 2016-12-16: qty 1

## 2016-12-16 MED ORDER — BUPIVACAINE-EPINEPHRINE (PF) 0.5% -1:200000 IJ SOLN
INTRAMUSCULAR | Status: DC | PRN
  Administered 2016-12-16: 5 mL

## 2016-12-16 MED ORDER — PROPOFOL 10 MG/ML IV BOLUS
INTRAVENOUS | Status: AC
  Filled 2016-12-16: qty 20

## 2016-12-16 MED ORDER — LACTATED RINGERS IV SOLN
INTRAVENOUS | Status: DC | PRN
  Administered 2016-12-16: 13:00:00 via INTRAVENOUS

## 2016-12-16 MED ORDER — SUGAMMADEX SODIUM 200 MG/2ML IV SOLN
INTRAVENOUS | Status: DC | PRN
  Administered 2016-12-16: 209.6 mg via INTRAVENOUS

## 2016-12-16 MED ORDER — PROPOFOL 10 MG/ML IV BOLUS
INTRAVENOUS | Status: DC | PRN
  Administered 2016-12-16: 200 mg via INTRAVENOUS

## 2016-12-16 MED ORDER — SUGAMMADEX SODIUM 200 MG/2ML IV SOLN
INTRAVENOUS | Status: AC
  Filled 2016-12-16: qty 2

## 2016-12-16 MED ORDER — SUCCINYLCHOLINE CHLORIDE 20 MG/ML IJ SOLN
INTRAMUSCULAR | Status: DC | PRN
  Administered 2016-12-16: 100 mg via INTRAVENOUS

## 2016-12-16 MED ORDER — MIDAZOLAM HCL 2 MG/2ML IJ SOLN
INTRAMUSCULAR | Status: AC
  Filled 2016-12-16: qty 2

## 2016-12-16 MED ORDER — TRAMADOL HCL 50 MG PO TABS: 50.0000 mg | ORAL_TABLET | Freq: Four times a day (QID) | ORAL | 0 refills | Status: AC | PRN

## 2016-12-16 MED ORDER — ACETAMINOPHEN 10 MG/ML IV SOLN
INTRAVENOUS | Status: DC | PRN
  Administered 2016-12-16: 1000 mg via INTRAVENOUS

## 2016-12-16 MED ORDER — ONDANSETRON HCL 4 MG/2ML IJ SOLN: 4.0000 mg | Freq: Once | INTRAMUSCULAR | Status: DC | PRN

## 2016-12-16 MED ORDER — FAMOTIDINE 20 MG PO TABS
ORAL_TABLET | ORAL | Status: AC
  Administered 2016-12-16: 20 mg via ORAL
  Filled 2016-12-16: qty 1

## 2016-12-16 MED ORDER — CEFAZOLIN SODIUM-DEXTROSE 2-4 GM/100ML-% IV SOLN
INTRAVENOUS | Status: AC
Start: 2016-12-16 — End: 2016-12-16
  Filled 2016-12-16: qty 100

## 2016-12-16 MED ORDER — ROCURONIUM BROMIDE 100 MG/10ML IV SOLN
INTRAVENOUS | Status: DC | PRN
  Administered 2016-12-16: 30 mg via INTRAVENOUS
  Administered 2016-12-16: 20 mg via INTRAVENOUS
  Administered 2016-12-16: 10 mg via INTRAVENOUS

## 2016-12-16 MED ORDER — FENTANYL CITRATE (PF) 100 MCG/2ML IJ SOLN
INTRAMUSCULAR | Status: AC
  Filled 2016-12-16: qty 2

## 2016-12-16 MED ORDER — KETOROLAC TROMETHAMINE 30 MG/ML IJ SOLN
INTRAMUSCULAR | Status: DC | PRN
  Administered 2016-12-16: 30 mg via INTRAVENOUS

## 2016-12-16 MED ORDER — BUPIVACAINE-EPINEPHRINE (PF) 0.5% -1:200000 IJ SOLN
INTRAMUSCULAR | Status: AC
  Filled 2016-12-16: qty 30

## 2016-12-16 MED ORDER — DEXAMETHASONE SODIUM PHOSPHATE 10 MG/ML IJ SOLN: INTRAMUSCULAR | Status: DC | PRN

## 2016-12-16 MED ORDER — FENTANYL CITRATE (PF) 100 MCG/2ML IJ SOLN: 25.0000 ug | INTRAMUSCULAR | Status: DC | PRN

## 2016-12-16 MED ORDER — FAMOTIDINE 20 MG PO TABS
20.0000 mg | ORAL_TABLET | Freq: Once | ORAL | Status: AC
  Administered 2016-12-16: 20 mg via ORAL

## 2016-12-16 MED ORDER — MIDAZOLAM HCL 2 MG/2ML IJ SOLN
INTRAMUSCULAR | Status: DC | PRN
  Administered 2016-12-16: 2 mg via INTRAVENOUS

## 2016-12-16 MED ORDER — ACETAMINOPHEN NICU IV SYRINGE 10 MG/ML
INTRAVENOUS | Status: AC
  Filled 2016-12-16: qty 1

## 2016-12-16 MED ORDER — SODIUM CHLORIDE 0.9 % IV SOLN
INTRAVENOUS | Status: DC
  Administered 2016-12-16: 13:00:00 via INTRAVENOUS

## 2016-12-16 MED ORDER — TECHNETIUM TC 99M SULFUR COLLOID FILTERED
0.9020 | Freq: Once | INTRAVENOUS | Status: AC | PRN
  Administered 2016-12-16: 0.902 via INTRADERMAL

## 2016-12-16 SURGICAL SUPPLY — 39 items
BLADE SURG 15 STRL LF DISP TIS (BLADE) ×1 IMPLANT
BLADE SURG 15 STRL SS (BLADE) ×1
CANISTER SUCT 1200ML W/VALVE (MISCELLANEOUS) ×2 IMPLANT
CHLORAPREP W/TINT 26ML (MISCELLANEOUS) ×2 IMPLANT
CNTNR SPEC 2.5X3XGRAD LEK (MISCELLANEOUS) ×1
CONT SPEC 4OZ STER OR WHT (MISCELLANEOUS) ×1
CONTAINER SPEC 2.5X3XGRAD LEK (MISCELLANEOUS) ×1 IMPLANT
DERMABOND ADVANCED (GAUZE/BANDAGES/DRESSINGS) ×1
DERMABOND ADVANCED .7 DNX12 (GAUZE/BANDAGES/DRESSINGS) ×1 IMPLANT
DEVICE DUBIN SPECIMEN MAMMOGRA (MISCELLANEOUS) ×2 IMPLANT
DRAPE LAPAROTOMY 77X122 PED (DRAPES) ×2 IMPLANT
DRAPE SHEET LG 3/4 BI-LAMINATE (DRAPES) ×2 IMPLANT
ELECT CAUTERY BLADE 6.4 (BLADE) ×2 IMPLANT
ELECT REM PT RETURN 9FT ADLT (ELECTROSURGICAL) ×2
ELECTRODE REM PT RTRN 9FT ADLT (ELECTROSURGICAL) ×1 IMPLANT
GLOVE BIO SURGEON STRL SZ 6.5 (GLOVE) ×2 IMPLANT
GLOVE BIO SURGEON STRL SZ7 (GLOVE) ×4 IMPLANT
GLOVE BIOGEL PI IND STRL 7.5 (GLOVE) ×2 IMPLANT
GLOVE BIOGEL PI INDICATOR 7.5 (GLOVE) ×2
GOWN STRL REUS W/ TWL LRG LVL3 (GOWN DISPOSABLE) ×4 IMPLANT
GOWN STRL REUS W/TWL LRG LVL3 (GOWN DISPOSABLE) ×4
KIT RM TURNOVER STRD PROC AR (KITS) ×2 IMPLANT
LABEL OR SOLS (LABEL) ×2 IMPLANT
MARGIN MAP 10MM (MISCELLANEOUS) ×2 IMPLANT
NEEDLE HYPO 25X1 1.5 SAFETY (NEEDLE) ×2 IMPLANT
PACK BASIN MINOR ARMC (MISCELLANEOUS) ×2 IMPLANT
SLEVE PROBE SENORX GAMMA FIND (MISCELLANEOUS) ×4 IMPLANT
SPONGE XRAY 4X4 16PLY STRL (MISCELLANEOUS) ×2 IMPLANT
SUT CHROMIC 3 0 SH 27 (SUTURE) IMPLANT
SUT ETHILON 3-0 FS-10 30 BLK (SUTURE) ×2
SUT MNCRL 4-0 (SUTURE) ×1
SUT MNCRL 4-0 27XMFL (SUTURE) ×1
SUT SILK 2 0 SH (SUTURE) ×2 IMPLANT
SUT VIC AB 3-0 SH 27 (SUTURE) ×2
SUT VIC AB 3-0 SH 27X BRD (SUTURE) ×2 IMPLANT
SUTURE EHLN 3-0 FS-10 30 BLK (SUTURE) ×1 IMPLANT
SUTURE MNCRL 4-0 27XMF (SUTURE) ×1 IMPLANT
SYRINGE 10CC LL (SYRINGE) ×2 IMPLANT
WATER STERILE IRR 1000ML POUR (IV SOLUTION) ×2 IMPLANT

## 2016-12-16 NOTE — Interval H&P Note (Signed)
History and Physical Interval Note:  12/16/2016 12:49 PM  Bonnie Holt  has presented today for surgery, with the diagnosis of CARCINOMA OF UPPER QUADRANT OF LEFT BREAST  The various methods of treatment have been discussed with the patient and family. After consideration of risks, benefits and other options for treatment, the patient has consented to  Procedure(s): PARTIAL MASTECTOMY WITH NEEDLE LOCALIZATION (Left) SENTINEL NODE BIOPSY (Left) as a surgical intervention .  The patient's history has been reviewed, patient examined, no change in status, stable for surgery.  I have reviewed the patient's chart and labs.  Questions were answered to the patient's satisfaction.     Herbert Pun

## 2016-12-16 NOTE — Transfer of Care (Signed)
Immediate Anesthesia Transfer of Care Note  Patient: Bonnie Holt  Procedure(s) Performed: PARTIAL MASTECTOMY WITH NEEDLE LOCALIZATION (Left Breast) SENTINEL NODE BIOPSY (Left Breast)  Patient Location: PACU  Anesthesia Type:General  Level of Consciousness: sedated  Airway & Oxygen Therapy: Patient Spontanous Breathing and Patient connected to face mask oxygen  Post-op Assessment: Report given to RN and Post -op Vital signs reviewed and stable  Post vital signs: Reviewed and stable  Last Vitals:  Vitals:   12/16/16 1251  BP: (!) 164/58  Pulse: (!) 58  Resp: 18  Temp: 36.8 C  SpO2: 98%    Last Pain: There were no vitals filed for this visit.       Complications: No apparent anesthesia complications

## 2016-12-16 NOTE — Op Note (Signed)
Preoperative diagnosis: Malignant neoplasm of the left upper outer quadrant breast.  Postoperative diagnosis: Malignant neoplasm of the left upper outer quadrant breast.   Procedure: Left needle-localized breast partial mastectomy (quadrantectomy).                       Left Axillary Sentinel Lymph node biopsy  Anesthesia: GETA  Surgeon: Dr. Windell Moment  Wound Classification: Clean  Indications: This 71 year old female with a nonpalpable left breast multifocal three lesions noted on mammography with core biopsy demonstrating invasive mammary carcinoma in two of them and DCIS in one of them that requires needle-localized quadrantectomy for treatment with sentinel lymph node biopsy.   Findings: 1. All three clips identified on specimen mammography 2. Preliminary call by pathology referring margins are free of ink 3. Closest margin was the deep to medial border.  4. One sentinel lymph node identified 5. Adequate hemostasis 6. No palpable lesion at the end of procedure.   Description of procedure: Preoperative needle localization was performed by radiology. In the nuclear medicine suite, the subareolar region was injected with Tc-99 sulfur colloid. Localization studies were reviewed. The patient was taken to the operating room and placed supine on the operating table, and after general anesthesia the left chest and axilla were prepped and draped in the usual sterile fashion. A time-out was completed verifying correct patient, procedure, site, positioning, and implant(s) and/or special equipment prior to beginning this procedure.  By comparing the localization studies with the direction and skin entry site of the needle, the probable trajectory and location of the mass was visualized. A circumareolar skin ellipitical incision was planned in such a way as to minimize the amount of dissection to reach the mass, containing both wires.  The skin incision was made. Flaps were raised  circumferentially. A 2-0 silk figure-of-eight stay suture was placed around the wire and used for retraction. Dissection was then taken down circumferentially, taking care to include the entire localizing needle and a wide margin of grossly normal tissue. The specimen and entire localizing wire were removed. The specimen was oriented and sent to radiology with the localization studies. Confirmation was received that the entire target lesion had been resected. The wound was irrigated. Hemostasis was checked. Since the quadrantectomy of the left upper outer quadrant was done, the axilla was accessed through the same incision.  A hand-held gamma probe was used to identify the location of the hottest spot in the axilla.  Dissection was carried down until subdermal facias was advanced. The probe was placed and again, the point of maximal count was found. Dissection continue until nodule was identified. The probe was placed in contact with the node and 256 counts were recorded. The node was excised in its entirety. Ex vivo, the node measured 266 counts when placed on the probe. The bed of the node measured 8 counts.  No additional hot spots were identified. No clinically abnormal nodes were palpated. The procedure was terminated.  The axillary fascia was closed with a 3-0 vicryl. The wound was closed with interrupted sutures of 3-0 Vicryl and a subcuticular suture of Monocryl 4-0. No attempt was made to close the dead space. Dermabon was applied.  The patient tolerated the procedure well and was taken to the postanesthesia care unit in stable condition.   Specimen: Left Breast Mass (Margin maps: cranial, caudal, medial, lateral, deep)                    Sentinel  Lymph nodes   Complications: None  Estimated Blood Loss: 20mL

## 2016-12-16 NOTE — Anesthesia Procedure Notes (Signed)
Procedure Name: Intubation Date/Time: 12/16/2016 1:21 PM Performed by: DGUYQIH, Ameliana Brashear Pre-anesthesia Checklist: Patient identified, Patient being monitored, Timeout performed, Emergency Drugs available and Suction available Patient Re-evaluated:Patient Re-evaluated prior to induction Oxygen Delivery Method: Circle system utilized Preoxygenation: Pre-oxygenation with 100% oxygen Induction Type: IV induction Ventilation: Mask ventilation without difficulty and Oral airway inserted - appropriate to patient size Laryngoscope Size: Mac and 4 Grade View: Grade III Tube type: Oral Tube size: 7.0 mm Number of attempts: 1 Airway Equipment and Method: Stylet Placement Confirmation: ETT inserted through vocal cords under direct vision,  positive ETCO2 and breath sounds checked- equal and bilateral Secured at: 22 cm Tube secured with: Tape Dental Injury: Teeth and Oropharynx as per pre-operative assessment and Injury to lip  Difficulty Due To: Difficult Airway- due to anterior larynx, Difficult Airway- due to reduced neck mobility and Difficulty was anticipated

## 2016-12-16 NOTE — Discharge Instructions (Signed)
AMBULATORY SURGERY  DISCHARGE INSTRUCTIONS   1) The drugs that you were given will stay in your system until tomorrow so for the next 24 hours you should not:  A) Drive an automobile B) Make any legal decisions C) Drink any alcoholic beverage   2) You may resume regular meals tomorrow.  Today it is better to start with liquids and gradually work up to solid foods.  You may eat anything you prefer, but it is better to start with liquids, then soup and crackers, and gradually work up to solid foods.   3) Please notify your doctor immediately if you have any unusual bleeding, trouble breathing, redness and pain at the surgery site, drainage, fever, or pain not relieved by medication.    4) Additional Instructions:        Please contact your physician with any problems or Same Day Surgery at [phone], Monday through Friday 6 am to 4 pm, or [place] [facility] number at [phone]. Diet: Resume home heart healthy regular diet.   Activity: No heavy lifting >20 pounds (children, pets, laundry, garbage) or strenuous activity with left upper extremity until follow-up, but light activity and walking are encouraged. Do not drive or drink alcohol if taking narcotic pain medications.  Wound care: May shower with soapy water and pat dry (do not rub incisions), but no baths or submerging incision underwater until follow-up. (no swimming)   Medications: Resume all home medications. For mild to moderate pain: acetaminophen (Tylenol) or ibuprofen (if no kidney disease). Combining Tylenol with alcohol can substantially increase your risk of causing liver disease. Narcotic pain medications, if prescribed, can be used for severe pain, though may cause nausea, constipation, and drowsiness. Do not combine Tylenol and Percocet within a 6 hour period as Percocet contains Tylenol. If you do not need the narcotic pain medication, you do not need to fill the prescription.  Call office  [phone]) at any time if any questions, worsening pain, fevers/chills, bleeding, drainage from incision site, or other concerns.

## 2016-12-16 NOTE — Anesthesia Postprocedure Evaluation (Signed)
Anesthesia Post Note  Patient: Lavern Crimi  Procedure(s) Performed: PARTIAL MASTECTOMY WITH NEEDLE LOCALIZATION (Left Breast) SENTINEL NODE BIOPSY (Left Breast)  Patient location during evaluation: PACU Anesthesia Type: General Level of consciousness: awake and alert Pain management: pain level controlled Vital Signs Assessment: post-procedure vital signs reviewed and stable Respiratory status: spontaneous breathing, nonlabored ventilation, respiratory function stable and patient connected to nasal cannula oxygen Cardiovascular status: blood pressure returned to baseline and stable Postop Assessment: no apparent nausea or vomiting Anesthetic complications: no     Last Vitals:  Vitals:   12/16/16 1645 12/16/16 1659  BP:  (!) 162/81  Pulse:  (!) 56  Resp:  16  Temp:  (!) 36.3 C  SpO2: 100% 100%    Last Pain:  Vitals:   12/16/16 1659  TempSrc: Temporal  PainSc:                  Molli Barrows
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
## 2016-12-16 NOTE — Anesthesia Post-op Follow-up Note (Signed)
Anesthesia QCDR form completed.        

## 2016-12-16 NOTE — H&P (View-Only) (Signed)
PATIENT PROFILE: Bonnie Holt is a 71 y.o. female who presents to the Clinic for consultation at the request of Dr. Dalbert Garnet for evaluation of breast carcinoma.  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 The patient denies previous palpable masses, no nipple discharge, no skin changes. Patient denies previous biopsy. Denies any previous pregnancy. Menarche at 71 years old, Menopause at 71 years old. No history of chest radiation, no hormone therapy and no first or second relative breast cancer history. Patient already seen oncologist and was oriented about diagnosis and therapeutic alternatives.    PROBLEM LIST: Problem List  Date Reviewed: 12/08/2016         Noted   Type 2 diabetes mellitus without complication (CMS-HCC) 04/15/2015   HTN, goal below 140/90 04/15/2015   Overview    White coat component noted.       Pure hypercholesterolemia 04/15/2015   OSA on CPAP 04/15/2015   Coronary artery disease involving native coronary artery 04/15/2015   Overview    Post stent      Health care maintenance 04/15/2015   Overview    Colon 8-14, due 8-19, gyn with abnl paps, mmgs yearly, flu yearly, tdap 9-17, pneumovax distant and prevnar11-16 , zostavax obtained.   MEDICARE WELLNESS VISIT   PROVIDERS RENDERING CARE Dr. Dareen Piano, Dr. Dalbert Garnet in [facility], [facility] orthopedics  FUNCTIONAL ASSESSMENT  (1) Hearing: Demonstrates normal hearing in conversation.  (2) Risk of Falls: No reports of falls or abnormal balance. Gait is observed to be good upon observation.  (3) Home Safety; Home is safe and secure (4) Activities of Daily Living; Household chores and grooming are managed without problems. Personal finances are managed without  problems.   DEPRESSION SCREENING There does not seem to be loss of interest in activities nor excess crying or changes in sleep or appetite.   COGNITIVE SCREENING Orientation is appropriate as are responses to questions and general conversation. No reports of forgetfulness or losing things.    PREVENTION PLAN Cardiovascular: Followed closely on cholesterol meds Diabetes: followed closely with disease Mammogram: Yearly mammogram Bone Density: Vitamin d Colon Cancer: Last colonoscopy 8-14  Glaucoma: Yearly eye exam and for DM also Pneumonia: Pneumovax in the past and prevnar 13 in 2016 Shingles; History of Zostavax Influenza: Flu vaccine each fall Smoking Cessation: NA   OTHER PERSONALIZED HEALTH ADVISE Walking as feasible and diabetic diet.   END OF LIFE CARE WANTS Full Code     Einar Crow MD             ASCUS with positive high risk HPV cervical Unknown   Mild dysplasia of cervix Unknown   Vulvitis Unknown   Osteopenia Unknown   Morbid obesity with body mass index of 40.0-44.9 in adult (CMS-HCC) Unknown      GENERAL REVIEW OF SYSTEMS:   General ROS: negative for - chills, fatigue, fever, weight gain or weight loss Allergy and Immunology ROS: negative for - hives  Hematological and Lymphatic ROS: negative for - bleeding problems or bruising, negative for palpable nodes Endocrine ROS: negative for - heat or cold intolerance, hair changes Respiratory ROS: negative for - cough, shortness of breath or wheezing Cardiovascular ROS: no chest pain or palpitations GI ROS: negative for nausea, vomiting, abdominal pain,  diarrhea, constipation Musculoskeletal ROS: positive for back pain Neurological ROS: negative for - confusion, syncope Dermatological ROS: negative for pruritus and rash  MEDICATIONS: Current Outpatient Prescriptions  Medication Sig Dispense Refill  . alendronate (FOSAMAX) 70 MG tablet Take 70 mg by mouth every 7 (seven) days. Take with a full  glass of water. Do not lie down for the next 30 min.    Marland Kitchen aspirin 81 MG EC tablet Take 81 mg by mouth once daily.    Marland Kitchen CALCIUM CITRATE/VITAMIN D3 (CITRACAL + D MAXIMUM ORAL) Take by mouth.    Marland Kitchen CINNAMON BARK (CINNAMON ORAL) Take by mouth.    . clopidogrel (PLAVIX) 75 mg tablet TAKE 1 TABLET BY MOUTH ONCE DAILY 90 tablet 1  . glipiZIDE (GLUCOTROL XL) 5 MG XL tablet Take 1 tablet (5 mg total) by mouth once daily. 90 tablet 1  . lancing device with lancets kit Use 1 each 3 (three) times daily. Use as instructed.    Marland Kitchen losartan (COZAAR) 50 MG tablet Take 1 tablet (50 mg total) by mouth once daily. 90 tablet 1  . metFORMIN (GLUCOPHAGE) 1000 MG tablet Take 1 tablet (1,000 mg total) by mouth 2 (two) times daily with meals. 180 tablet 1  . metoprolol succinate (TOPROL-XL) 50 MG XL tablet Take 1 tablet (50 mg total) by mouth once daily. 90 tablet 1  . MULTIVITAMIN ORAL Take by mouth.    Glory Rosebush DELICA LANCETS 33 gauge Misc USE TWICE DAILY AS INSTRUCTED 100 each 2  . ONETOUCH ULTRA TEST test strip USE TWICE DAILY AS DIRECTED 200 each 3  . pravastatin (PRAVACHOL) 40 MG tablet Take 1 tablet (40 mg total) by mouth once daily. 90 tablet 1  . SF 1.1 % gel   99  . terbinafine HCl (LAMISIL) 250 mg tablet Take 1 tablet (250 mg total) by mouth once daily. 90 tablet 1   No current facility-administered medications for this visit.     ALLERGIES: Ace inhibitors  PAST MEDICAL HISTORY: Past Medical History:  Diagnosis Date  . Abnormal screening cardiac CT   . Arthritis of knee   . ASCUS with positive high risk HPV cervical   . Cervicalgia   . Chronic ischemic heart disease   . Chronic osteoarthritis   . Cough   . Dermatomycosis   . Diabetes mellitus type 2, uncomplicated (CMS-HCC)   . Dyspnea and respiratory abnormalities, unspecified   . Hyperlipidemia, unspecified, unspecified   . Hypertension   . Malaise and fatigue   . Mild dysplasia of cervix   . Morbid obesity with body mass index of  40.0-44.9 in adult (CMS-HCC)   . Obstructive sleep apnea   . Osteopenia   . Urinary incontinence, mixed   . Vulvitis     PAST SURGICAL HISTORY: Past Surgical History:  Procedure Laterality Date  . CERVICAL BIOPSY  W/ LOOP ELECTRODE EXCISION  12/24/2010  . COLPOSCOPY  07/31/2014   Mild Dysplasia  . CORONARY ANGIOPLASTY WITH STENT PLACEMENT  June 2012  . Cryosurgery of Cervix  09/03/2014     FAMILY HISTORY: Family History  Problem Relation Age of Onset  . Lung cancer Father   . Brain cancer Father   . Dementia Maternal Grandmother   . High blood pressure (Hypertension) Brother   . Obesity Brother      SOCIAL HISTORY: Social History   Social History  . Marital status: Married    Spouse name: N/A  . Number of children: N/A  . Years of education:  N/A   Social History Main Topics  . Smoking status: Never Smoker  . Smokeless tobacco: Never Used     Comment: never used  . Alcohol use No  . Drug use: No  . Sexual activity: Not Currently    Partners: Male    Birth control/ protection: None   Other Topics Concern  . None   Social History Narrative  . None    PHYSICAL EXAM: Vitals:   12/08/16 0754  BP: 164/74  Pulse: 66  Temp: 36.8 C (98.2 F)   Body mass index is 42.98 kg/m. Weight: (!) 106.6 kg (235 lb)   General Appearance:    Alert, cooperative, no distress, appears stated age  Head:     Atraumatic, normocephalic  Eyes:   Anciteric, no erythema, no secretions  Neck:   Supple, symmetrical, no JVD, no palpable lymph nodes  Mouth:   Lips, mucosa, and tongue normal;   Lungs:     Clear to auscultation bilaterally, respirations unlabored   Heart:    Regular rate and rhythm, S1 and S2 normal, no murmur, rub   or gallop  Abdomen:     Soft, non-tender, bowel sounds active all four quadrants,    no masses, no organomegaly  Extremities:   Extremities normal, atraumatic, no cyanosis or edema  Skin:   Skin color, texture, turgor normal, no rashes or lesions    Neurologic: Breast:   Grossly intact. No palpable masses, no nipple retraction, no skin changes bilaterally. No axillary nodes palpable bilaterally. Breast examination was done sitting and flat.    REVIEW OF DATA: I have reviewed the following data today: Appointment on 12/02/2016  Component Date Value  . Glucose 12/02/2016 157*  . Sodium 12/02/2016 141   . Potassium 12/02/2016 4.5   . Chloride 12/02/2016 104   . Carbon Dioxide (CO2) 12/02/2016 26.6   . Calcium 12/02/2016 10.2   . Urea Nitrogen (BUN) 12/02/2016 23   . Creatinine 12/02/2016 1.0   . Glomerular Filtration Ra* 12/02/2016 55*  . BUN/Crea Ratio 12/02/2016 23.0*  . Anion Gap w/K 12/02/2016 14.9   . Hemoglobin A1C 12/02/2016 6.8*  . Average Blood Glucose (C* 12/02/2016 148   . Protein, Total 12/02/2016 6.8   . Albumin 12/02/2016 4.3   . Bilirubin, Total 12/02/2016 0.5   . Bilirubin, Conjugated 12/02/2016 0.10   . Alk Phos (alkaline Phosp* 12/02/2016 67   . AST  12/02/2016 20   . ALT  12/02/2016 22   . Cholesterol, Total 12/02/2016 173   . Triglyceride 12/02/2016 136   . HDL (High Density Lipopr* 12/02/2016 40.7   . LDL (Low Density Lipopro* 12/02/2016 105   . VLDL Cholesterol 12/02/2016 27   . Cholesterol/HDL Ratio 12/02/2016 4.3   . Urine Albumin, Random 12/02/2016 <7      ASSESSMENT: Ms. Buresh is a 71 y.o. female presenting for consultation for breast carcinoma. The patient was found with 3 subcentimeter lesion of the left breast. 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 A subcentimeter lesion identified on sonogram of the left axilla was negative for metastasis.   Patient has history of coronary artery stent currently on aspirin and plavix. Patient was oriented about surgical alternatives (partial mastectomy vs total mastectomy both with sentinel lymph nodes biopsies). Oriented about the procedure, benefits and risks. Patient  refers that agrees to continue with partial mastectomy with SLNBx.   PLAN: 1. Partial mastectomy with sentinel lymph  node biopsy 2. Needs Internal Medicine clearance 3. Pre op work up includes Labs (CBC, CMP, Coagulation profile), Chest xray, EKG  Patient and/or representative verbalized understanding, all questions were answered, and were agreeable with the plan outlined above.    Herbert Pun, MD  Electronically signed by Herbert Pun, MD

## 2016-12-17 ENCOUNTER — Encounter: Payer: Self-pay | Admitting: General Surgery

## 2016-12-18 LAB — SURGICAL PATHOLOGY

## 2016-12-29 ENCOUNTER — Ambulatory Visit: Payer: PPO | Admitting: Radiation Oncology

## 2016-12-29 ENCOUNTER — Inpatient Hospital Stay: Payer: PPO | Admitting: Oncology

## 2017-01-01 DIAGNOSIS — C50412 Malignant neoplasm of upper-outer quadrant of left female breast: Secondary | ICD-10-CM | POA: Diagnosis not present

## 2017-01-01 DIAGNOSIS — Z170 Estrogen receptor positive status [ER+]: Secondary | ICD-10-CM | POA: Diagnosis not present
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
## 2017-01-06 ENCOUNTER — Inpatient Hospital Stay: Payer: PPO | Admitting: Oncology

## 2017-01-06 ENCOUNTER — Ambulatory Visit
Admission: RE | Admit: 2017-01-06 | Discharge: 2017-01-06 | Disposition: A | Discharge status: Home / Self Care | Payer: PPO | Source: Ambulatory Visit | Attending: Radiation Oncology | Admitting: Radiation Oncology

## 2017-01-06 ENCOUNTER — Encounter: Payer: Self-pay | Admitting: Oncology

## 2017-01-06 ENCOUNTER — Encounter: Payer: Self-pay | Admitting: Radiation Oncology

## 2017-01-06 VITALS — BP 143/70 | HR 76 | Temp 97.5°F | Resp 18 | Wt 232.9 lb

## 2017-01-06 DIAGNOSIS — Z7982 Long term (current) use of aspirin: Secondary | ICD-10-CM | POA: Insufficient documentation

## 2017-01-06 DIAGNOSIS — M129 Arthropathy, unspecified: Secondary | ICD-10-CM | POA: Insufficient documentation

## 2017-01-06 DIAGNOSIS — Z9012 Acquired absence of left breast and nipple: Secondary | ICD-10-CM | POA: Diagnosis not present

## 2017-01-06 DIAGNOSIS — I2510 Atherosclerotic heart disease of native coronary artery without angina pectoris: Secondary | ICD-10-CM | POA: Diagnosis not present

## 2017-01-06 DIAGNOSIS — G4730 Sleep apnea, unspecified: Secondary | ICD-10-CM | POA: Diagnosis not present

## 2017-01-06 DIAGNOSIS — Z510 Encounter for antineoplastic radiation therapy: Secondary | ICD-10-CM | POA: Insufficient documentation

## 2017-01-06 DIAGNOSIS — Z7984 Long term (current) use of oral hypoglycemic drugs: Secondary | ICD-10-CM | POA: Diagnosis not present

## 2017-01-06 DIAGNOSIS — E119 Type 2 diabetes mellitus without complications: Secondary | ICD-10-CM | POA: Insufficient documentation

## 2017-01-06 DIAGNOSIS — C50412 Malignant neoplasm of upper-outer quadrant of left female breast: Secondary | ICD-10-CM | POA: Insufficient documentation

## 2017-01-06 DIAGNOSIS — Z79899 Other long term (current) drug therapy: Secondary | ICD-10-CM | POA: Insufficient documentation

## 2017-01-06 DIAGNOSIS — I10 Essential (primary) hypertension: Secondary | ICD-10-CM | POA: Insufficient documentation

## 2017-01-06 DIAGNOSIS — Z170 Estrogen receptor positive status [ER+]: Secondary | ICD-10-CM | POA: Diagnosis not present

## 2017-01-06 DIAGNOSIS — Z808 Family history of malignant neoplasm of other organs or systems: Secondary | ICD-10-CM | POA: Diagnosis not present
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
## 2017-01-06 NOTE — Progress Notes (Signed)
  Oncology Nurse Navigator Documentation  Navigator Location: [facility] (01/06/17 1300) Referral date to RadOnc/MedOnc: 01/06/17 (01/06/17 1300) )Navigator Encounter Type: Initial RadOnc (01/06/17 1300)                         Barriers/Navigation Needs: Education;Coordination of Care;Financial (01/06/17 1300) Education: Understanding Cancer/ Treatment Options;Preparing for Upcoming Surgery/ Treatment (01/06/17 1300) Interventions: Coordination of Care;Education (01/06/17 1300)                      Time Spent with Patient: 75 (01/06/17 1300)   Accompanied patient, and husband to initial Rad/Onc visit with Dr. Baruch Gouty.   Plans for simulation on Monday 01/11/17.  Low Oncotype results given to patient.  She will return to see Dr. Grayland Ormond after radiation complete to initiate antihormonal treatment.  Dr Windell Moment notified of radiation plan.  Patient had questions regarding prior authorization.  Per Luann Chrismon, prior authorization will be obtained after simulation.

## 2017-01-09 DIAGNOSIS — G4733 Obstructive sleep apnea (adult) (pediatric): Secondary | ICD-10-CM | POA: Diagnosis not present

## 2017-01-09 DIAGNOSIS — I10 Essential (primary) hypertension: Secondary | ICD-10-CM | POA: Diagnosis not present

## 2017-01-11 ENCOUNTER — Ambulatory Visit
Admission: RE | Admit: 2017-01-11 | Discharge: 2017-01-11 | Disposition: A | Discharge status: Home / Self Care | Payer: PPO | Source: Ambulatory Visit | Attending: Radiation Oncology | Admitting: Radiation Oncology

## 2017-01-11 DIAGNOSIS — Z170 Estrogen receptor positive status [ER+]: Secondary | ICD-10-CM | POA: Diagnosis not present

## 2017-01-11 DIAGNOSIS — C50412 Malignant neoplasm of upper-outer quadrant of left female breast: Secondary | ICD-10-CM | POA: Diagnosis not present

## 2017-01-12 DIAGNOSIS — C50412 Malignant neoplasm of upper-outer quadrant of left female breast: Secondary | ICD-10-CM | POA: Diagnosis not present

## 2017-01-12 DIAGNOSIS — G4733 Obstructive sleep apnea (adult) (pediatric): Secondary | ICD-10-CM | POA: Diagnosis not present

## 2017-01-12 DIAGNOSIS — Z170 Estrogen receptor positive status [ER+]: Secondary | ICD-10-CM | POA: Diagnosis not present

## 2017-01-12 DIAGNOSIS — I10 Essential (primary) hypertension: Secondary | ICD-10-CM | POA: Diagnosis not present

## 2017-01-15 ENCOUNTER — Other Ambulatory Visit: Payer: Self-pay | Admitting: *Deleted

## 2017-01-15 DIAGNOSIS — C50412 Malignant neoplasm of upper-outer quadrant of left female breast: Secondary | ICD-10-CM

## 2017-01-19 ENCOUNTER — Ambulatory Visit
Admission: RE | Admit: 2017-01-19 | Discharge: 2017-01-19 | Disposition: A | Discharge status: Home / Self Care | Payer: PPO | Source: Ambulatory Visit | Attending: Radiation Oncology | Admitting: Radiation Oncology

## 2017-01-19 DIAGNOSIS — Z170 Estrogen receptor positive status [ER+]: Secondary | ICD-10-CM | POA: Diagnosis not present

## 2017-01-19 DIAGNOSIS — C50412 Malignant neoplasm of upper-outer quadrant of left female breast: Secondary | ICD-10-CM | POA: Diagnosis not present

## 2017-01-20 ENCOUNTER — Ambulatory Visit
Admission: RE | Admit: 2017-01-20 | Discharge: 2017-01-20 | Disposition: A | Discharge status: Home / Self Care | Payer: PPO | Source: Ambulatory Visit | Attending: Radiation Oncology | Admitting: Radiation Oncology

## 2017-01-20 DIAGNOSIS — Z170 Estrogen receptor positive status [ER+]: Secondary | ICD-10-CM | POA: Diagnosis not present

## 2017-01-20 DIAGNOSIS — C50412 Malignant neoplasm of upper-outer quadrant of left female breast: Secondary | ICD-10-CM | POA: Diagnosis not present

## 2017-01-21 ENCOUNTER — Ambulatory Visit
Admission: RE | Admit: 2017-01-21 | Discharge: 2017-01-21 | Disposition: A | Discharge status: Home / Self Care | Payer: PPO | Source: Ambulatory Visit | Attending: Radiation Oncology | Admitting: Radiation Oncology

## 2017-01-21 DIAGNOSIS — C50412 Malignant neoplasm of upper-outer quadrant of left female breast: Secondary | ICD-10-CM | POA: Diagnosis not present

## 2017-01-21 DIAGNOSIS — Z170 Estrogen receptor positive status [ER+]: Secondary | ICD-10-CM | POA: Diagnosis not present

## 2017-01-22 ENCOUNTER — Ambulatory Visit
Admission: RE | Admit: 2017-01-22 | Discharge: 2017-01-22 | Disposition: A | Discharge status: Home / Self Care | Payer: PPO | Source: Ambulatory Visit | Attending: Radiation Oncology | Admitting: Radiation Oncology

## 2017-01-22 DIAGNOSIS — C50412 Malignant neoplasm of upper-outer quadrant of left female breast: Secondary | ICD-10-CM | POA: Diagnosis not present

## 2017-01-22 DIAGNOSIS — Z170 Estrogen receptor positive status [ER+]: Secondary | ICD-10-CM | POA: Diagnosis not present

## 2017-01-25 ENCOUNTER — Ambulatory Visit
Admission: RE | Admit: 2017-01-25 | Discharge: 2017-01-25 | Disposition: A | Discharge status: Home / Self Care | Payer: PPO | Source: Ambulatory Visit | Attending: Radiation Oncology | Admitting: Radiation Oncology

## 2017-01-25 DIAGNOSIS — C50412 Malignant neoplasm of upper-outer quadrant of left female breast: Secondary | ICD-10-CM | POA: Diagnosis not present

## 2017-01-25 DIAGNOSIS — Z170 Estrogen receptor positive status [ER+]: Secondary | ICD-10-CM | POA: Diagnosis not present

## 2017-01-26 ENCOUNTER — Ambulatory Visit
Admission: RE | Admit: 2017-01-26 | Discharge: 2017-01-26 | Disposition: A | Discharge status: Home / Self Care | Payer: PPO | Source: Ambulatory Visit | Attending: Radiation Oncology | Admitting: Radiation Oncology

## 2017-01-26 DIAGNOSIS — C50412 Malignant neoplasm of upper-outer quadrant of left female breast: Secondary | ICD-10-CM | POA: Diagnosis not present

## 2017-01-26 DIAGNOSIS — Z170 Estrogen receptor positive status [ER+]: Secondary | ICD-10-CM | POA: Diagnosis not present

## 2017-01-27 ENCOUNTER — Ambulatory Visit
Admission: RE | Admit: 2017-01-27 | Discharge: 2017-01-27 | Disposition: A | Discharge status: Home / Self Care | Payer: PPO | Source: Ambulatory Visit | Attending: Radiation Oncology | Admitting: Radiation Oncology

## 2017-01-27 DIAGNOSIS — C50412 Malignant neoplasm of upper-outer quadrant of left female breast: Secondary | ICD-10-CM | POA: Diagnosis not present

## 2017-01-27 DIAGNOSIS — Z170 Estrogen receptor positive status [ER+]: Secondary | ICD-10-CM | POA: Diagnosis not present

## 2017-01-28 ENCOUNTER — Ambulatory Visit
Admission: RE | Admit: 2017-01-28 | Discharge: 2017-01-28 | Disposition: A | Discharge status: Home / Self Care | Payer: PPO | Source: Ambulatory Visit | Attending: Radiation Oncology | Admitting: Radiation Oncology

## 2017-01-28 DIAGNOSIS — Z170 Estrogen receptor positive status [ER+]: Secondary | ICD-10-CM | POA: Diagnosis not present

## 2017-01-28 DIAGNOSIS — C50412 Malignant neoplasm of upper-outer quadrant of left female breast: Secondary | ICD-10-CM | POA: Diagnosis not present

## 2017-01-29 ENCOUNTER — Ambulatory Visit
Admission: RE | Admit: 2017-01-29 | Discharge: 2017-01-29 | Disposition: A | Discharge status: Home / Self Care | Payer: PPO | Source: Ambulatory Visit | Attending: Radiation Oncology | Admitting: Radiation Oncology

## 2017-01-29 DIAGNOSIS — C50412 Malignant neoplasm of upper-outer quadrant of left female breast: Secondary | ICD-10-CM | POA: Diagnosis not present

## 2017-01-29 DIAGNOSIS — Z170 Estrogen receptor positive status [ER+]: Secondary | ICD-10-CM | POA: Diagnosis not present

## 2017-02-01 ENCOUNTER — Ambulatory Visit
Admission: RE | Admit: 2017-02-01 | Discharge: 2017-02-01 | Disposition: A | Discharge status: Home / Self Care | Payer: PPO | Source: Ambulatory Visit | Attending: Radiation Oncology | Admitting: Radiation Oncology

## 2017-02-01 DIAGNOSIS — Z170 Estrogen receptor positive status [ER+]: Secondary | ICD-10-CM | POA: Diagnosis not present

## 2017-02-01 DIAGNOSIS — C50412 Malignant neoplasm of upper-outer quadrant of left female breast: Secondary | ICD-10-CM | POA: Diagnosis not present

## 2017-02-02 ENCOUNTER — Ambulatory Visit
Admission: RE | Admit: 2017-02-02 | Discharge: 2017-02-02 | Disposition: A | Discharge status: Home / Self Care | Payer: PPO | Source: Ambulatory Visit | Attending: Radiation Oncology | Admitting: Radiation Oncology

## 2017-02-02 DIAGNOSIS — C50412 Malignant neoplasm of upper-outer quadrant of left female breast: Secondary | ICD-10-CM | POA: Diagnosis not present

## 2017-02-02 DIAGNOSIS — Z170 Estrogen receptor positive status [ER+]: Secondary | ICD-10-CM | POA: Diagnosis not present

## 2017-02-03 ENCOUNTER — Inpatient Hospital Stay: Payer: PPO | Attending: Radiation Oncology

## 2017-02-03 ENCOUNTER — Ambulatory Visit
Admission: RE | Admit: 2017-02-03 | Discharge: 2017-02-03 | Disposition: A | Discharge status: Home / Self Care | Payer: PPO | Source: Ambulatory Visit | Attending: Radiation Oncology | Admitting: Radiation Oncology

## 2017-02-03 DIAGNOSIS — Z79899 Other long term (current) drug therapy: Secondary | ICD-10-CM | POA: Insufficient documentation

## 2017-02-03 DIAGNOSIS — I10 Essential (primary) hypertension: Secondary | ICD-10-CM | POA: Diagnosis not present

## 2017-02-03 DIAGNOSIS — I2510 Atherosclerotic heart disease of native coronary artery without angina pectoris: Secondary | ICD-10-CM | POA: Diagnosis not present

## 2017-02-03 DIAGNOSIS — Z7984 Long term (current) use of oral hypoglycemic drugs: Secondary | ICD-10-CM | POA: Diagnosis not present

## 2017-02-03 DIAGNOSIS — Z170 Estrogen receptor positive status [ER+]: Secondary | ICD-10-CM | POA: Diagnosis not present

## 2017-02-03 DIAGNOSIS — Z7982 Long term (current) use of aspirin: Secondary | ICD-10-CM | POA: Diagnosis not present

## 2017-02-03 DIAGNOSIS — G4733 Obstructive sleep apnea (adult) (pediatric): Secondary | ICD-10-CM | POA: Diagnosis not present

## 2017-02-03 DIAGNOSIS — E119 Type 2 diabetes mellitus without complications: Secondary | ICD-10-CM | POA: Diagnosis not present

## 2017-02-03 DIAGNOSIS — C50412 Malignant neoplasm of upper-outer quadrant of left female breast: Secondary | ICD-10-CM | POA: Diagnosis not present

## 2017-02-03 LAB — CBC
HCT: 38.4 % (ref 35.0–47.0)
HEMOGLOBIN: 13 g/dL (ref 12.0–16.0)
MCH: 31.4 pg (ref 26.0–34.0)
MCHC: 33.7 g/dL (ref 32.0–36.0)
MCV: 93.1 fL (ref 80.0–100.0)
Platelets: 243 10*3/uL (ref 150–440)
RBC: 4.13 MIL/uL (ref 3.80–5.20)
RDW: 14 % (ref 11.5–14.5)
WBC: 7.3 10*3/uL (ref 3.6–11.0)

## 2017-02-08 ENCOUNTER — Ambulatory Visit
Admission: RE | Admit: 2017-02-08 | Discharge: 2017-02-08 | Disposition: A | Discharge status: Home / Self Care | Payer: PPO | Source: Ambulatory Visit | Attending: Radiation Oncology | Admitting: Radiation Oncology

## 2017-02-08 DIAGNOSIS — Z170 Estrogen receptor positive status [ER+]: Secondary | ICD-10-CM | POA: Diagnosis not present

## 2017-02-08 DIAGNOSIS — C50412 Malignant neoplasm of upper-outer quadrant of left female breast: Secondary | ICD-10-CM | POA: Diagnosis not present

## 2017-02-09 ENCOUNTER — Other Ambulatory Visit: Payer: Self-pay | Admitting: *Deleted

## 2017-02-09 ENCOUNTER — Ambulatory Visit
Admission: RE | Admit: 2017-02-09 | Discharge: 2017-02-09 | Disposition: A | Discharge status: Home / Self Care | Payer: PPO | Source: Ambulatory Visit | Attending: Radiation Oncology | Admitting: Radiation Oncology

## 2017-02-09 DIAGNOSIS — C50412 Malignant neoplasm of upper-outer quadrant of left female breast: Secondary | ICD-10-CM | POA: Diagnosis not present

## 2017-02-09 DIAGNOSIS — I10 Essential (primary) hypertension: Secondary | ICD-10-CM | POA: Diagnosis not present

## 2017-02-09 DIAGNOSIS — Z170 Estrogen receptor positive status [ER+]: Secondary | ICD-10-CM | POA: Diagnosis not present

## 2017-02-09 DIAGNOSIS — G4733 Obstructive sleep apnea (adult) (pediatric): Secondary | ICD-10-CM | POA: Diagnosis not present

## 2017-02-09 MED ORDER — AZITHROMYCIN 250 MG PO TABS: ORAL_TABLET | ORAL | 0 refills | Status: DC

## 2017-02-10 ENCOUNTER — Ambulatory Visit
Admission: RE | Admit: 2017-02-10 | Discharge: 2017-02-10 | Disposition: A | Discharge status: Home / Self Care | Payer: PPO | Source: Ambulatory Visit | Attending: Radiation Oncology | Admitting: Radiation Oncology

## 2017-02-10 DIAGNOSIS — Z170 Estrogen receptor positive status [ER+]: Secondary | ICD-10-CM | POA: Diagnosis not present

## 2017-02-10 DIAGNOSIS — C50412 Malignant neoplasm of upper-outer quadrant of left female breast: Secondary | ICD-10-CM | POA: Diagnosis not present

## 2017-02-11 ENCOUNTER — Ambulatory Visit
Admission: RE | Admit: 2017-02-11 | Discharge: 2017-02-11 | Disposition: A | Discharge status: Home / Self Care | Payer: PPO | Source: Ambulatory Visit | Attending: Radiation Oncology | Admitting: Radiation Oncology

## 2017-02-11 DIAGNOSIS — Z170 Estrogen receptor positive status [ER+]: Secondary | ICD-10-CM | POA: Diagnosis not present

## 2017-02-11 DIAGNOSIS — C50412 Malignant neoplasm of upper-outer quadrant of left female breast: Secondary | ICD-10-CM | POA: Diagnosis not present

## 2017-02-12 ENCOUNTER — Ambulatory Visit
Admission: RE | Admit: 2017-02-12 | Discharge: 2017-02-12 | Disposition: A | Discharge status: Home / Self Care | Payer: PPO | Source: Ambulatory Visit | Attending: Radiation Oncology | Admitting: Radiation Oncology

## 2017-02-12 DIAGNOSIS — Z170 Estrogen receptor positive status [ER+]: Secondary | ICD-10-CM | POA: Diagnosis not present

## 2017-02-12 DIAGNOSIS — C50412 Malignant neoplasm of upper-outer quadrant of left female breast: Secondary | ICD-10-CM | POA: Diagnosis not present
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
## 2017-02-15 ENCOUNTER — Ambulatory Visit
Admission: RE | Admit: 2017-02-15 | Discharge: 2017-02-15 | Disposition: A | Discharge status: Home / Self Care | Payer: PPO | Source: Ambulatory Visit | Attending: Radiation Oncology | Admitting: Radiation Oncology

## 2017-02-15 DIAGNOSIS — C50412 Malignant neoplasm of upper-outer quadrant of left female breast: Secondary | ICD-10-CM | POA: Diagnosis not present

## 2017-02-15 DIAGNOSIS — Z170 Estrogen receptor positive status [ER+]: Secondary | ICD-10-CM | POA: Diagnosis not present

## 2017-02-16 ENCOUNTER — Ambulatory Visit
Admission: RE | Admit: 2017-02-16 | Discharge: 2017-02-16 | Disposition: A | Discharge status: Home / Self Care | Payer: PPO | Source: Ambulatory Visit | Attending: Radiation Oncology | Admitting: Radiation Oncology

## 2017-02-16 DIAGNOSIS — C50412 Malignant neoplasm of upper-outer quadrant of left female breast: Secondary | ICD-10-CM | POA: Diagnosis not present

## 2017-02-16 DIAGNOSIS — Z170 Estrogen receptor positive status [ER+]: Secondary | ICD-10-CM | POA: Diagnosis not present

## 2017-02-17 ENCOUNTER — Inpatient Hospital Stay: Payer: PPO | Attending: Oncology | Admitting: Oncology

## 2017-02-17 ENCOUNTER — Inpatient Hospital Stay: Payer: PPO

## 2017-02-17 ENCOUNTER — Ambulatory Visit
Admission: RE | Admit: 2017-02-17 | Discharge: 2017-02-17 | Disposition: A | Discharge status: Home / Self Care | Payer: PPO | Source: Ambulatory Visit | Attending: Radiation Oncology | Admitting: Radiation Oncology

## 2017-02-17 VITALS — BP 154/80 | HR 76 | Temp 97.7°F | Resp 18 | Wt 227.5 lb

## 2017-02-17 DIAGNOSIS — C50412 Malignant neoplasm of upper-outer quadrant of left female breast: Secondary | ICD-10-CM

## 2017-02-17 DIAGNOSIS — Z170 Estrogen receptor positive status [ER+]: Secondary | ICD-10-CM

## 2017-02-17 DIAGNOSIS — M8580 Other specified disorders of bone density and structure, unspecified site: Secondary | ICD-10-CM | POA: Diagnosis not present

## 2017-02-17 DIAGNOSIS — Z7984 Long term (current) use of oral hypoglycemic drugs: Secondary | ICD-10-CM | POA: Diagnosis not present

## 2017-02-17 DIAGNOSIS — Z7982 Long term (current) use of aspirin: Secondary | ICD-10-CM | POA: Insufficient documentation

## 2017-02-17 DIAGNOSIS — E119 Type 2 diabetes mellitus without complications: Secondary | ICD-10-CM | POA: Diagnosis not present

## 2017-02-17 DIAGNOSIS — I10 Essential (primary) hypertension: Secondary | ICD-10-CM | POA: Insufficient documentation

## 2017-02-17 DIAGNOSIS — Z79899 Other long term (current) drug therapy: Secondary | ICD-10-CM | POA: Insufficient documentation

## 2017-02-17 DIAGNOSIS — I2510 Atherosclerotic heart disease of native coronary artery without angina pectoris: Secondary | ICD-10-CM | POA: Insufficient documentation

## 2017-02-17 DIAGNOSIS — G4733 Obstructive sleep apnea (adult) (pediatric): Secondary | ICD-10-CM | POA: Diagnosis not present

## 2017-02-17 LAB — CBC
HEMATOCRIT: 39.4 % (ref 35.0–47.0)
Hemoglobin: 13.3 g/dL (ref 12.0–16.0)
MCH: 31.5 pg (ref 26.0–34.0)
MCHC: 33.8 g/dL (ref 32.0–36.0)
MCV: 93.1 fL (ref 80.0–100.0)
Platelets: 269 10*3/uL (ref 150–440)
RBC: 4.23 MIL/uL (ref 3.80–5.20)
RDW: 14.2 % (ref 11.5–14.5)
WBC: 7.3 10*3/uL (ref 3.6–11.0)

## 2017-02-17 MED ORDER — LETROZOLE 2.5 MG PO TABS: 2.5000 mg | ORAL_TABLET | Freq: Every day | ORAL | 11 refills | Status: DC

## 2017-02-18 ENCOUNTER — Ambulatory Visit
Admission: RE | Admit: 2017-02-18 | Discharge: 2017-02-18 | Disposition: A | Discharge status: Home / Self Care | Payer: PPO | Source: Ambulatory Visit | Attending: Radiation Oncology | Admitting: Radiation Oncology

## 2017-02-18 DIAGNOSIS — C50412 Malignant neoplasm of upper-outer quadrant of left female breast: Secondary | ICD-10-CM | POA: Diagnosis not present

## 2017-02-18 DIAGNOSIS — Z170 Estrogen receptor positive status [ER+]: Secondary | ICD-10-CM | POA: Diagnosis not present

## 2017-02-19 ENCOUNTER — Ambulatory Visit
Admission: RE | Admit: 2017-02-19 | Discharge: 2017-02-19 | Disposition: A | Discharge status: Home / Self Care | Payer: PPO | Source: Ambulatory Visit | Attending: Radiation Oncology | Admitting: Radiation Oncology

## 2017-02-22 ENCOUNTER — Ambulatory Visit: Payer: PPO

## 2017-02-22 DIAGNOSIS — I10 Essential (primary) hypertension: Secondary | ICD-10-CM | POA: Diagnosis not present

## 2017-02-22 DIAGNOSIS — G4733 Obstructive sleep apnea (adult) (pediatric): Secondary | ICD-10-CM | POA: Diagnosis not present

## 2017-02-23 ENCOUNTER — Ambulatory Visit
Admission: RE | Admit: 2017-02-23 | Discharge: 2017-02-23 | Disposition: A | Discharge status: Home / Self Care | Payer: PPO | Source: Ambulatory Visit | Attending: Radiation Oncology | Admitting: Radiation Oncology

## 2017-02-23 ENCOUNTER — Other Ambulatory Visit: Payer: Self-pay | Admitting: *Deleted

## 2017-02-23 DIAGNOSIS — C50412 Malignant neoplasm of upper-outer quadrant of left female breast: Secondary | ICD-10-CM | POA: Diagnosis not present

## 2017-02-23 DIAGNOSIS — Z170 Estrogen receptor positive status [ER+]: Secondary | ICD-10-CM | POA: Diagnosis not present

## 2017-02-23 MED ORDER — SILVER SULFADIAZINE 1 % EX CREA: 1.0000 "application " | TOPICAL_CREAM | Freq: Two times a day (BID) | CUTANEOUS | 2 refills | Status: DC

## 2017-02-24 ENCOUNTER — Ambulatory Visit: Payer: PPO

## 2017-02-25 ENCOUNTER — Ambulatory Visit
Admission: RE | Admit: 2017-02-25 | Discharge: 2017-02-25 | Disposition: A | Discharge status: Home / Self Care | Payer: PPO | Source: Ambulatory Visit | Attending: Radiation Oncology | Admitting: Radiation Oncology

## 2017-02-25 DIAGNOSIS — Z170 Estrogen receptor positive status [ER+]: Secondary | ICD-10-CM | POA: Diagnosis not present

## 2017-02-25 DIAGNOSIS — C50412 Malignant neoplasm of upper-outer quadrant of left female breast: Secondary | ICD-10-CM | POA: Diagnosis not present

## 2017-02-26 ENCOUNTER — Ambulatory Visit
Admission: RE | Admit: 2017-02-26 | Discharge: 2017-02-26 | Disposition: A | Discharge status: Home / Self Care | Payer: PPO | Source: Ambulatory Visit | Attending: Radiation Oncology | Admitting: Radiation Oncology

## 2017-02-26 DIAGNOSIS — C50412 Malignant neoplasm of upper-outer quadrant of left female breast: Secondary | ICD-10-CM | POA: Diagnosis not present

## 2017-02-26 DIAGNOSIS — Z170 Estrogen receptor positive status [ER+]: Secondary | ICD-10-CM | POA: Diagnosis not present

## 2017-03-01 ENCOUNTER — Ambulatory Visit
Admission: RE | Admit: 2017-03-01 | Discharge: 2017-03-01 | Disposition: A | Discharge status: Home / Self Care | Payer: PPO | Source: Ambulatory Visit | Attending: Radiation Oncology | Admitting: Radiation Oncology

## 2017-03-01 DIAGNOSIS — Z170 Estrogen receptor positive status [ER+]: Secondary | ICD-10-CM | POA: Diagnosis not present

## 2017-03-01 DIAGNOSIS — C50412 Malignant neoplasm of upper-outer quadrant of left female breast: Secondary | ICD-10-CM | POA: Diagnosis not present

## 2017-03-02 ENCOUNTER — Ambulatory Visit
Admission: RE | Admit: 2017-03-02 | Discharge: 2017-03-02 | Disposition: A | Discharge status: Home / Self Care | Payer: PPO | Source: Ambulatory Visit | Attending: Radiation Oncology | Admitting: Radiation Oncology

## 2017-03-02 DIAGNOSIS — Z170 Estrogen receptor positive status [ER+]: Secondary | ICD-10-CM | POA: Diagnosis not present

## 2017-03-02 DIAGNOSIS — C50412 Malignant neoplasm of upper-outer quadrant of left female breast: Secondary | ICD-10-CM | POA: Diagnosis not present

## 2017-03-03 ENCOUNTER — Inpatient Hospital Stay: Payer: PPO

## 2017-03-03 ENCOUNTER — Ambulatory Visit
Admission: RE | Admit: 2017-03-03 | Discharge: 2017-03-03 | Disposition: A | Discharge status: Home / Self Care | Payer: PPO | Source: Ambulatory Visit | Attending: Radiation Oncology | Admitting: Radiation Oncology

## 2017-03-03 DIAGNOSIS — Z170 Estrogen receptor positive status [ER+]: Secondary | ICD-10-CM | POA: Diagnosis not present

## 2017-03-03 DIAGNOSIS — C50412 Malignant neoplasm of upper-outer quadrant of left female breast: Secondary | ICD-10-CM

## 2017-03-03 LAB — CBC
HCT: 36.9 % (ref 35.0–47.0)
Hemoglobin: 12.5 g/dL (ref 12.0–16.0)
MCH: 31.4 pg (ref 26.0–34.0)
MCHC: 33.8 g/dL (ref 32.0–36.0)
MCV: 92.9 fL (ref 80.0–100.0)
PLATELETS: 228 10*3/uL (ref 150–440)
RBC: 3.97 MIL/uL (ref 3.80–5.20)
RDW: 14.2 % (ref 11.5–14.5)
WBC: 6.3 10*3/uL (ref 3.6–11.0)

## 2017-03-04 ENCOUNTER — Ambulatory Visit
Admission: RE | Admit: 2017-03-04 | Discharge: 2017-03-04 | Disposition: A | Discharge status: Home / Self Care | Payer: PPO | Source: Ambulatory Visit | Attending: Radiation Oncology | Admitting: Radiation Oncology

## 2017-03-04 DIAGNOSIS — Z170 Estrogen receptor positive status [ER+]: Secondary | ICD-10-CM | POA: Diagnosis not present

## 2017-03-04 DIAGNOSIS — C50412 Malignant neoplasm of upper-outer quadrant of left female breast: Secondary | ICD-10-CM | POA: Diagnosis not present

## 2017-03-05 ENCOUNTER — Ambulatory Visit
Admission: RE | Admit: 2017-03-05 | Discharge: 2017-03-05 | Disposition: A | Discharge status: Home / Self Care | Payer: PPO | Source: Ambulatory Visit | Attending: Radiation Oncology | Admitting: Radiation Oncology

## 2017-03-05 DIAGNOSIS — Z170 Estrogen receptor positive status [ER+]: Secondary | ICD-10-CM | POA: Diagnosis not present

## 2017-03-05 DIAGNOSIS — C50412 Malignant neoplasm of upper-outer quadrant of left female breast: Secondary | ICD-10-CM | POA: Diagnosis not present

## 2017-03-08 ENCOUNTER — Ambulatory Visit
Admission: RE | Admit: 2017-03-08 | Discharge: 2017-03-08 | Disposition: A | Discharge status: Home / Self Care | Payer: PPO | Source: Ambulatory Visit | Attending: Radiation Oncology | Admitting: Radiation Oncology

## 2017-03-08 DIAGNOSIS — Z170 Estrogen receptor positive status [ER+]: Secondary | ICD-10-CM | POA: Diagnosis not present

## 2017-03-08 DIAGNOSIS — C50412 Malignant neoplasm of upper-outer quadrant of left female breast: Secondary | ICD-10-CM | POA: Diagnosis not present

## 2017-03-10 ENCOUNTER — Ambulatory Visit
Admission: RE | Admit: 2017-03-10 | Discharge: 2017-03-10 | Disposition: A | Discharge status: Home / Self Care | Payer: PPO | Source: Ambulatory Visit | Attending: Radiation Oncology | Admitting: Radiation Oncology

## 2017-03-10 DIAGNOSIS — Z170 Estrogen receptor positive status [ER+]: Secondary | ICD-10-CM | POA: Diagnosis not present

## 2017-03-10 DIAGNOSIS — C50412 Malignant neoplasm of upper-outer quadrant of left female breast: Secondary | ICD-10-CM | POA: Diagnosis not present

## 2017-03-11 ENCOUNTER — Ambulatory Visit
Admission: RE | Admit: 2017-03-11 | Discharge: 2017-03-11 | Disposition: A | Discharge status: Home / Self Care | Payer: PPO | Source: Ambulatory Visit | Attending: Radiation Oncology | Admitting: Radiation Oncology

## 2017-03-11 DIAGNOSIS — I10 Essential (primary) hypertension: Secondary | ICD-10-CM | POA: Diagnosis not present

## 2017-03-11 DIAGNOSIS — Z170 Estrogen receptor positive status [ER+]: Secondary | ICD-10-CM | POA: Diagnosis not present

## 2017-03-11 DIAGNOSIS — C50412 Malignant neoplasm of upper-outer quadrant of left female breast: Secondary | ICD-10-CM | POA: Diagnosis not present

## 2017-03-11 DIAGNOSIS — G4733 Obstructive sleep apnea (adult) (pediatric): Secondary | ICD-10-CM | POA: Diagnosis not present

## 2017-03-12 ENCOUNTER — Ambulatory Visit
Admission: RE | Admit: 2017-03-12 | Discharge: 2017-03-12 | Disposition: A | Discharge status: Home / Self Care | Payer: PPO | Source: Ambulatory Visit | Attending: Radiation Oncology | Admitting: Radiation Oncology

## 2017-03-12 DIAGNOSIS — Z170 Estrogen receptor positive status [ER+]: Secondary | ICD-10-CM | POA: Diagnosis not present

## 2017-03-12 DIAGNOSIS — C50412 Malignant neoplasm of upper-outer quadrant of left female breast: Secondary | ICD-10-CM | POA: Diagnosis not present

## 2017-03-15 ENCOUNTER — Ambulatory Visit
Admission: RE | Admit: 2017-03-15 | Discharge: 2017-03-15 | Disposition: A | Discharge status: Home / Self Care | Payer: PPO | Source: Ambulatory Visit | Attending: Radiation Oncology | Admitting: Radiation Oncology

## 2017-03-17 ENCOUNTER — Ambulatory Visit
Admission: RE | Admit: 2017-03-17 | Discharge: 2017-03-17 | Disposition: A | Discharge status: Home / Self Care | Payer: PPO | Source: Ambulatory Visit | Attending: Radiation Oncology | Admitting: Radiation Oncology

## 2017-03-17 DIAGNOSIS — C50412 Malignant neoplasm of upper-outer quadrant of left female breast: Secondary | ICD-10-CM | POA: Diagnosis not present

## 2017-03-17 DIAGNOSIS — Z170 Estrogen receptor positive status [ER+]: Secondary | ICD-10-CM | POA: Diagnosis not present

## 2017-03-18 ENCOUNTER — Ambulatory Visit: Payer: PPO

## 2017-03-18 ENCOUNTER — Ambulatory Visit
Admission: RE | Admit: 2017-03-18 | Discharge: 2017-03-18 | Disposition: A | Discharge status: Home / Self Care | Payer: PPO | Source: Ambulatory Visit | Attending: Radiation Oncology | Admitting: Radiation Oncology

## 2017-03-18 DIAGNOSIS — Z170 Estrogen receptor positive status [ER+]: Secondary | ICD-10-CM | POA: Diagnosis not present

## 2017-03-18 DIAGNOSIS — C50412 Malignant neoplasm of upper-outer quadrant of left female breast: Secondary | ICD-10-CM | POA: Diagnosis not present

## 2017-03-19 ENCOUNTER — Ambulatory Visit: Payer: PPO

## 2017-03-19 ENCOUNTER — Ambulatory Visit
Admission: RE | Admit: 2017-03-19 | Discharge: 2017-03-19 | Disposition: A | Discharge status: Home / Self Care | Payer: PPO | Source: Ambulatory Visit | Attending: Radiation Oncology | Admitting: Radiation Oncology

## 2017-03-19 DIAGNOSIS — C50412 Malignant neoplasm of upper-outer quadrant of left female breast: Secondary | ICD-10-CM | POA: Diagnosis not present

## 2017-03-19 DIAGNOSIS — Z170 Estrogen receptor positive status [ER+]: Secondary | ICD-10-CM | POA: Diagnosis not present

## 2017-03-22 ENCOUNTER — Ambulatory Visit
Admission: RE | Admit: 2017-03-22 | Discharge: 2017-03-22 | Disposition: A | Discharge status: Home / Self Care | Payer: PPO | Source: Ambulatory Visit | Attending: Radiation Oncology | Admitting: Radiation Oncology

## 2017-03-22 DIAGNOSIS — Z170 Estrogen receptor positive status [ER+]: Secondary | ICD-10-CM | POA: Diagnosis not present

## 2017-03-22 DIAGNOSIS — C50412 Malignant neoplasm of upper-outer quadrant of left female breast: Secondary | ICD-10-CM | POA: Diagnosis not present

## 2017-04-11 DIAGNOSIS — G4733 Obstructive sleep apnea (adult) (pediatric): Secondary | ICD-10-CM | POA: Diagnosis not present

## 2017-04-11 DIAGNOSIS — I10 Essential (primary) hypertension: Secondary | ICD-10-CM | POA: Diagnosis not present

## 2017-04-22 ENCOUNTER — Other Ambulatory Visit: Payer: Self-pay

## 2017-04-22 ENCOUNTER — Encounter: Payer: Self-pay | Admitting: Radiation Oncology

## 2017-04-22 ENCOUNTER — Ambulatory Visit
Admission: RE | Admit: 2017-04-22 | Discharge: 2017-04-22 | Disposition: A | Discharge status: Home / Self Care | Payer: PPO | Source: Ambulatory Visit | Attending: Radiation Oncology | Admitting: Radiation Oncology

## 2017-04-22 VITALS — BP 151/67 | HR 85 | Temp 97.5°F | Resp 20 | Wt 228.7 lb

## 2017-04-22 DIAGNOSIS — C50412 Malignant neoplasm of upper-outer quadrant of left female breast: Secondary | ICD-10-CM

## 2017-04-22 DIAGNOSIS — Z170 Estrogen receptor positive status [ER+]: Secondary | ICD-10-CM | POA: Insufficient documentation

## 2017-04-22 DIAGNOSIS — Z923 Personal history of irradiation: Secondary | ICD-10-CM | POA: Insufficient documentation

## 2017-04-22 NOTE — Progress Notes (Signed)
Radiation Oncology Follow up Note  Name: Bonnie Holt   Date:   04/22/2017 MRN:  [id number] DOB: December 11, 1945    This 72 y.o. female presents to the clinic today for one-month follow-up status post whole breast radiation for stage I ER/PR positive invasive mammary carcinoma the left breast.  REFERRING PROVIDER: Kirk Ruths, MD  HPI: Patient is a 72 year old female now out 1 month having treated her whole breast radiation to her left breast for a stage I ER/PR positive HER-2/neu negative invasive mammary carcinoma. She is seen today in routine follow-up is doing well. She specifically denies breast tenderness cough or bone pain.. She is currently on Femara tolerating that well without side effect.  COMPLICATIONS OF TREATMENT: none  FOLLOW UP COMPLIANCE: keeps appointments   PHYSICAL EXAM:  BP (!) 151/67   Pulse 85   Temp (!) 97.5 F (36.4 C)   Resp 20   Wt 228 lb 11.6 oz (103.8 kg)   BMI 41.83 kg/m  Lungs are clear to A&P cardiac examination essentially unremarkable with regular rate and rhythm. No dominant mass or nodularity is noted in either breast in 2 positions examined. Incision is well-healed. No axillary or supraclavicular adenopathy is appreciated. Cosmetic result is excellent. Well-developed well-nourished patient in NAD. HEENT reveals PERLA, EOMI, discs not visualized.  Oral cavity is clear. No oral mucosal lesions are identified. Neck is clear without evidence of cervical or supraclavicular adenopathy. Lungs are clear to A&P. Cardiac examination is essentially unremarkable with regular rate and rhythm without murmur rub or thrill. Abdomen is benign with no organomegaly or masses noted. Motor sensory and DTR levels are equal and symmetric in the upper and lower extremities. Cranial nerves II through XII are grossly intact. Proprioception is intact. No peripheral adenopathy or edema is identified. No motor or sensory levels are noted. Crude visual fields are within normal  range.  RADIOLOGY RESULTS: No current films for review  PLAN: Present time patient is doing well with no evidence of disease recovering nicely 1 month out. I'm please were overall progress. She continues on Femara without side effect. I've asked to see her back in 4-5 months for follow-up. She knows to call sooner with any concerns.  I would like to take this opportunity to thank you for allowing me to participate in the care of your patient.Noreene Filbert, MD

## 2017-05-12 DIAGNOSIS — G4733 Obstructive sleep apnea (adult) (pediatric): Secondary | ICD-10-CM | POA: Diagnosis not present

## 2017-05-12 DIAGNOSIS — I10 Essential (primary) hypertension: Secondary | ICD-10-CM | POA: Diagnosis not present
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
## 2017-05-18 ENCOUNTER — Encounter: Payer: Self-pay | Admitting: Oncology

## 2017-05-18 ENCOUNTER — Inpatient Hospital Stay: Payer: PPO | Attending: Oncology | Admitting: Oncology

## 2017-05-18 VITALS — BP 128/77 | HR 77 | Temp 97.8°F | Resp 18 | Ht 62.0 in | Wt 227.8 lb

## 2017-05-18 DIAGNOSIS — C50412 Malignant neoplasm of upper-outer quadrant of left female breast: Secondary | ICD-10-CM | POA: Diagnosis not present

## 2017-05-18 DIAGNOSIS — M8580 Other specified disorders of bone density and structure, unspecified site: Secondary | ICD-10-CM | POA: Insufficient documentation

## 2017-05-18 DIAGNOSIS — Z170 Estrogen receptor positive status [ER+]: Secondary | ICD-10-CM | POA: Diagnosis not present

## 2017-05-18 DIAGNOSIS — Z79811 Long term (current) use of aromatase inhibitors: Secondary | ICD-10-CM | POA: Insufficient documentation

## 2017-05-18 NOTE — Progress Notes (Signed)
No new changes noted today 

## 2017-05-19 ENCOUNTER — Other Ambulatory Visit: Payer: Self-pay

## 2017-05-24 DIAGNOSIS — I10 Essential (primary) hypertension: Secondary | ICD-10-CM | POA: Diagnosis not present

## 2017-05-24 DIAGNOSIS — G4733 Obstructive sleep apnea (adult) (pediatric): Secondary | ICD-10-CM | POA: Diagnosis not present

## 2017-05-27 DIAGNOSIS — D2272 Melanocytic nevi of left lower limb, including hip: Secondary | ICD-10-CM | POA: Diagnosis not present

## 2017-05-27 DIAGNOSIS — X32XXXA Exposure to sunlight, initial encounter: Secondary | ICD-10-CM | POA: Diagnosis not present

## 2017-05-27 DIAGNOSIS — L820 Inflamed seborrheic keratosis: Secondary | ICD-10-CM | POA: Diagnosis not present

## 2017-05-27 DIAGNOSIS — L918 Other hypertrophic disorders of the skin: Secondary | ICD-10-CM | POA: Diagnosis not present

## 2017-05-27 DIAGNOSIS — L570 Actinic keratosis: Secondary | ICD-10-CM | POA: Diagnosis not present

## 2017-05-27 DIAGNOSIS — L821 Other seborrheic keratosis: Secondary | ICD-10-CM | POA: Diagnosis not present

## 2017-05-27 DIAGNOSIS — D2261 Melanocytic nevi of right upper limb, including shoulder: Secondary | ICD-10-CM | POA: Diagnosis not present

## 2017-06-01 DIAGNOSIS — E119 Type 2 diabetes mellitus without complications: Secondary | ICD-10-CM | POA: Diagnosis not present

## 2017-06-01 DIAGNOSIS — I10 Essential (primary) hypertension: Secondary | ICD-10-CM | POA: Diagnosis not present

## 2017-06-01 DIAGNOSIS — E7800 Pure hypercholesterolemia, unspecified: Secondary | ICD-10-CM | POA: Diagnosis not present

## 2017-06-01 DIAGNOSIS — I25119 Atherosclerotic heart disease of native coronary artery with unspecified angina pectoris: Secondary | ICD-10-CM | POA: Diagnosis not present

## 2017-06-03 DIAGNOSIS — M1712 Unilateral primary osteoarthritis, left knee: Secondary | ICD-10-CM | POA: Diagnosis not present

## 2017-06-08 DIAGNOSIS — E119 Type 2 diabetes mellitus without complications: Secondary | ICD-10-CM | POA: Diagnosis not present

## 2017-06-08 DIAGNOSIS — I10 Essential (primary) hypertension: Secondary | ICD-10-CM | POA: Diagnosis not present

## 2017-06-08 DIAGNOSIS — G4733 Obstructive sleep apnea (adult) (pediatric): Secondary | ICD-10-CM | POA: Diagnosis not present

## 2017-06-08 DIAGNOSIS — I25119 Atherosclerotic heart disease of native coronary artery with unspecified angina pectoris: Secondary | ICD-10-CM | POA: Diagnosis not present

## 2017-06-08 DIAGNOSIS — E7800 Pure hypercholesterolemia, unspecified: Secondary | ICD-10-CM | POA: Diagnosis not present

## 2017-06-08 DIAGNOSIS — Z9989 Dependence on other enabling machines and devices: Secondary | ICD-10-CM | POA: Diagnosis not present

## 2017-06-09 DIAGNOSIS — G4733 Obstructive sleep apnea (adult) (pediatric): Secondary | ICD-10-CM | POA: Diagnosis not present

## 2017-06-09 DIAGNOSIS — I10 Essential (primary) hypertension: Secondary | ICD-10-CM | POA: Diagnosis not present

## 2017-06-17 DIAGNOSIS — Z955 Presence of coronary angioplasty implant and graft: Secondary | ICD-10-CM | POA: Diagnosis not present

## 2017-06-17 DIAGNOSIS — E119 Type 2 diabetes mellitus without complications: Secondary | ICD-10-CM | POA: Diagnosis not present

## 2017-06-17 DIAGNOSIS — M1990 Unspecified osteoarthritis, unspecified site: Secondary | ICD-10-CM | POA: Diagnosis not present

## 2017-06-17 DIAGNOSIS — E782 Mixed hyperlipidemia: Secondary | ICD-10-CM | POA: Diagnosis not present

## 2017-06-17 DIAGNOSIS — R0602 Shortness of breath: Secondary | ICD-10-CM | POA: Diagnosis not present

## 2017-06-17 DIAGNOSIS — I2510 Atherosclerotic heart disease of native coronary artery without angina pectoris: Secondary | ICD-10-CM | POA: Diagnosis not present

## 2017-06-17 DIAGNOSIS — I208 Other forms of angina pectoris: Secondary | ICD-10-CM | POA: Diagnosis not present

## 2017-06-17 DIAGNOSIS — R9431 Abnormal electrocardiogram [ECG] [EKG]: Secondary | ICD-10-CM | POA: Diagnosis not present

## 2017-06-17 DIAGNOSIS — I10 Essential (primary) hypertension: Secondary | ICD-10-CM | POA: Diagnosis not present

## 2017-06-23 DIAGNOSIS — G4733 Obstructive sleep apnea (adult) (pediatric): Secondary | ICD-10-CM | POA: Diagnosis not present

## 2017-06-23 DIAGNOSIS — I10 Essential (primary) hypertension: Secondary | ICD-10-CM | POA: Diagnosis not present

## 2017-06-25 DIAGNOSIS — R0602 Shortness of breath: Secondary | ICD-10-CM | POA: Diagnosis not present

## 2017-06-25 DIAGNOSIS — I208 Other forms of angina pectoris: Secondary | ICD-10-CM | POA: Diagnosis not present

## 2017-06-28 DIAGNOSIS — I5189 Other ill-defined heart diseases: Secondary | ICD-10-CM

## 2017-06-28 DIAGNOSIS — I5030 Unspecified diastolic (congestive) heart failure: Secondary | ICD-10-CM

## 2017-06-28 HISTORY — DX: Other ill-defined heart diseases: I51.89

## 2017-06-28 HISTORY — DX: Unspecified diastolic (congestive) heart failure: I50.30

## 2017-07-12 ENCOUNTER — Encounter: Payer: Self-pay | Admitting: Oncology

## 2017-07-23 DIAGNOSIS — G4733 Obstructive sleep apnea (adult) (pediatric): Secondary | ICD-10-CM | POA: Diagnosis not present

## 2017-07-23 DIAGNOSIS — I10 Essential (primary) hypertension: Secondary | ICD-10-CM | POA: Diagnosis not present

## 2017-08-22 ENCOUNTER — Emergency Department: Payer: PPO

## 2017-08-22 ENCOUNTER — Encounter: Payer: Self-pay | Admitting: Emergency Medicine

## 2017-08-22 ENCOUNTER — Other Ambulatory Visit: Payer: Self-pay

## 2017-08-22 ENCOUNTER — Inpatient Hospital Stay: Payer: PPO

## 2017-08-22 ENCOUNTER — Inpatient Hospital Stay
Admission: EM | Admit: 2017-08-22 | Discharge: 2017-09-15 | DRG: 871 | Disposition: A | Discharge status: Skilled Nursing Facility | Payer: PPO | Attending: Internal Medicine | Admitting: Internal Medicine

## 2017-08-22 DIAGNOSIS — R197 Diarrhea, unspecified: Secondary | ICD-10-CM | POA: Diagnosis not present

## 2017-08-22 DIAGNOSIS — E876 Hypokalemia: Secondary | ICD-10-CM | POA: Diagnosis present

## 2017-08-22 DIAGNOSIS — J9690 Respiratory failure, unspecified, unspecified whether with hypoxia or hypercapnia: Secondary | ICD-10-CM

## 2017-08-22 DIAGNOSIS — N10 Acute tubulo-interstitial nephritis: Secondary | ICD-10-CM | POA: Diagnosis not present

## 2017-08-22 DIAGNOSIS — Z515 Encounter for palliative care: Secondary | ICD-10-CM

## 2017-08-22 DIAGNOSIS — K567 Ileus, unspecified: Secondary | ICD-10-CM

## 2017-08-22 DIAGNOSIS — R509 Fever, unspecified: Secondary | ICD-10-CM | POA: Diagnosis not present

## 2017-08-22 DIAGNOSIS — K922 Gastrointestinal hemorrhage, unspecified: Secondary | ICD-10-CM | POA: Diagnosis not present

## 2017-08-22 DIAGNOSIS — N186 End stage renal disease: Secondary | ICD-10-CM | POA: Diagnosis not present

## 2017-08-22 DIAGNOSIS — I132 Hypertensive heart and chronic kidney disease with heart failure and with stage 5 chronic kidney disease, or end stage renal disease: Secondary | ICD-10-CM | POA: Diagnosis not present

## 2017-08-22 DIAGNOSIS — R34 Anuria and oliguria: Secondary | ICD-10-CM | POA: Diagnosis present

## 2017-08-22 DIAGNOSIS — N189 Chronic kidney disease, unspecified: Secondary | ICD-10-CM | POA: Diagnosis not present

## 2017-08-22 DIAGNOSIS — Z833 Family history of diabetes mellitus: Secondary | ICD-10-CM

## 2017-08-22 DIAGNOSIS — D6959 Other secondary thrombocytopenia: Secondary | ICD-10-CM | POA: Diagnosis not present

## 2017-08-22 DIAGNOSIS — K760 Fatty (change of) liver, not elsewhere classified: Secondary | ICD-10-CM | POA: Diagnosis present

## 2017-08-22 DIAGNOSIS — J9601 Acute respiratory failure with hypoxia: Secondary | ICD-10-CM | POA: Diagnosis not present

## 2017-08-22 DIAGNOSIS — I248 Other forms of acute ischemic heart disease: Secondary | ICD-10-CM | POA: Diagnosis present

## 2017-08-22 DIAGNOSIS — Z7984 Long term (current) use of oral hypoglycemic drugs: Secondary | ICD-10-CM

## 2017-08-22 DIAGNOSIS — D691 Qualitative platelet defects: Secondary | ICD-10-CM | POA: Diagnosis present

## 2017-08-22 DIAGNOSIS — J811 Chronic pulmonary edema: Secondary | ICD-10-CM | POA: Diagnosis present

## 2017-08-22 DIAGNOSIS — I25118 Atherosclerotic heart disease of native coronary artery with other forms of angina pectoris: Secondary | ICD-10-CM | POA: Diagnosis not present

## 2017-08-22 DIAGNOSIS — R41841 Cognitive communication deficit: Secondary | ICD-10-CM | POA: Diagnosis not present

## 2017-08-22 DIAGNOSIS — K219 Gastro-esophageal reflux disease without esophagitis: Secondary | ICD-10-CM | POA: Diagnosis not present

## 2017-08-22 DIAGNOSIS — N179 Acute kidney failure, unspecified: Secondary | ICD-10-CM

## 2017-08-22 DIAGNOSIS — R600 Localized edema: Secondary | ICD-10-CM | POA: Diagnosis not present

## 2017-08-22 DIAGNOSIS — K250 Acute gastric ulcer with hemorrhage: Secondary | ICD-10-CM | POA: Diagnosis not present

## 2017-08-22 DIAGNOSIS — R0602 Shortness of breath: Secondary | ICD-10-CM | POA: Diagnosis not present

## 2017-08-22 DIAGNOSIS — R7989 Other specified abnormal findings of blood chemistry: Secondary | ICD-10-CM

## 2017-08-22 DIAGNOSIS — I10 Essential (primary) hypertension: Secondary | ICD-10-CM | POA: Diagnosis not present

## 2017-08-22 DIAGNOSIS — R0600 Dyspnea, unspecified: Secondary | ICD-10-CM

## 2017-08-22 DIAGNOSIS — W57XXXD Bitten or stung by nonvenomous insect and other nonvenomous arthropods, subsequent encounter: Secondary | ICD-10-CM | POA: Diagnosis not present

## 2017-08-22 DIAGNOSIS — R1312 Dysphagia, oropharyngeal phase: Secondary | ICD-10-CM | POA: Diagnosis not present

## 2017-08-22 DIAGNOSIS — G4709 Other insomnia: Secondary | ICD-10-CM | POA: Diagnosis not present

## 2017-08-22 DIAGNOSIS — I4891 Unspecified atrial fibrillation: Secondary | ICD-10-CM | POA: Diagnosis not present

## 2017-08-22 DIAGNOSIS — N170 Acute kidney failure with tubular necrosis: Secondary | ICD-10-CM | POA: Diagnosis not present

## 2017-08-22 DIAGNOSIS — R531 Weakness: Secondary | ICD-10-CM | POA: Diagnosis not present

## 2017-08-22 DIAGNOSIS — A7740 Ehrlichiosis, unspecified: Secondary | ICD-10-CM | POA: Diagnosis not present

## 2017-08-22 DIAGNOSIS — N390 Urinary tract infection, site not specified: Secondary | ICD-10-CM | POA: Diagnosis not present

## 2017-08-22 DIAGNOSIS — Z8249 Family history of ischemic heart disease and other diseases of the circulatory system: Secondary | ICD-10-CM

## 2017-08-22 DIAGNOSIS — Z4682 Encounter for fitting and adjustment of non-vascular catheter: Secondary | ICD-10-CM | POA: Diagnosis not present

## 2017-08-22 DIAGNOSIS — D696 Thrombocytopenia, unspecified: Secondary | ICD-10-CM | POA: Diagnosis not present

## 2017-08-22 DIAGNOSIS — Z853 Personal history of malignant neoplasm of breast: Secondary | ICD-10-CM

## 2017-08-22 DIAGNOSIS — E872 Acidosis: Secondary | ICD-10-CM | POA: Diagnosis present

## 2017-08-22 DIAGNOSIS — I959 Hypotension, unspecified: Secondary | ICD-10-CM | POA: Diagnosis not present

## 2017-08-22 DIAGNOSIS — R601 Generalized edema: Secondary | ICD-10-CM | POA: Diagnosis not present

## 2017-08-22 DIAGNOSIS — Z808 Family history of malignant neoplasm of other organs or systems: Secondary | ICD-10-CM

## 2017-08-22 DIAGNOSIS — G9340 Encephalopathy, unspecified: Secondary | ICD-10-CM | POA: Diagnosis present

## 2017-08-22 DIAGNOSIS — Z6841 Body Mass Index (BMI) 40.0 and over, adult: Secondary | ICD-10-CM | POA: Diagnosis not present

## 2017-08-22 DIAGNOSIS — D62 Acute posthemorrhagic anemia: Secondary | ICD-10-CM | POA: Diagnosis not present

## 2017-08-22 DIAGNOSIS — K254 Chronic or unspecified gastric ulcer with hemorrhage: Secondary | ICD-10-CM | POA: Diagnosis not present

## 2017-08-22 DIAGNOSIS — D6489 Other specified anemias: Secondary | ICD-10-CM | POA: Diagnosis not present

## 2017-08-22 DIAGNOSIS — I447 Left bundle-branch block, unspecified: Secondary | ICD-10-CM | POA: Diagnosis present

## 2017-08-22 DIAGNOSIS — E119 Type 2 diabetes mellitus without complications: Secondary | ICD-10-CM

## 2017-08-22 DIAGNOSIS — E8770 Fluid overload, unspecified: Secondary | ICD-10-CM | POA: Diagnosis not present

## 2017-08-22 DIAGNOSIS — B961 Klebsiella pneumoniae [K. pneumoniae] as the cause of diseases classified elsewhere: Secondary | ICD-10-CM | POA: Diagnosis not present

## 2017-08-22 DIAGNOSIS — Z79899 Other long term (current) drug therapy: Secondary | ICD-10-CM

## 2017-08-22 DIAGNOSIS — K2900 Acute gastritis without bleeding: Secondary | ICD-10-CM | POA: Diagnosis not present

## 2017-08-22 DIAGNOSIS — Z7982 Long term (current) use of aspirin: Secondary | ICD-10-CM

## 2017-08-22 DIAGNOSIS — R109 Unspecified abdominal pain: Secondary | ICD-10-CM | POA: Diagnosis not present

## 2017-08-22 DIAGNOSIS — R945 Abnormal results of liver function studies: Secondary | ICD-10-CM

## 2017-08-22 DIAGNOSIS — E785 Hyperlipidemia, unspecified: Secondary | ICD-10-CM | POA: Diagnosis not present

## 2017-08-22 DIAGNOSIS — Z923 Personal history of irradiation: Secondary | ICD-10-CM

## 2017-08-22 DIAGNOSIS — R262 Difficulty in walking, not elsewhere classified: Secondary | ICD-10-CM | POA: Diagnosis not present

## 2017-08-22 DIAGNOSIS — J9600 Acute respiratory failure, unspecified whether with hypoxia or hypercapnia: Secondary | ICD-10-CM | POA: Diagnosis not present

## 2017-08-22 DIAGNOSIS — R0603 Acute respiratory distress: Secondary | ICD-10-CM | POA: Diagnosis not present

## 2017-08-22 DIAGNOSIS — I5031 Acute diastolic (congestive) heart failure: Secondary | ICD-10-CM | POA: Diagnosis not present

## 2017-08-22 DIAGNOSIS — Z7189 Other specified counseling: Secondary | ICD-10-CM | POA: Diagnosis not present

## 2017-08-22 DIAGNOSIS — E1122 Type 2 diabetes mellitus with diabetic chronic kidney disease: Secondary | ICD-10-CM

## 2017-08-22 DIAGNOSIS — I2510 Atherosclerotic heart disease of native coronary artery without angina pectoris: Secondary | ICD-10-CM | POA: Diagnosis not present

## 2017-08-22 DIAGNOSIS — E440 Moderate protein-calorie malnutrition: Secondary | ICD-10-CM | POA: Diagnosis not present

## 2017-08-22 DIAGNOSIS — Z955 Presence of coronary angioplasty implant and graft: Secondary | ICD-10-CM

## 2017-08-22 DIAGNOSIS — Z9012 Acquired absence of left breast and nipple: Secondary | ICD-10-CM

## 2017-08-22 DIAGNOSIS — J810 Acute pulmonary edema: Secondary | ICD-10-CM | POA: Diagnosis not present

## 2017-08-22 DIAGNOSIS — E1165 Type 2 diabetes mellitus with hyperglycemia: Secondary | ICD-10-CM | POA: Diagnosis not present

## 2017-08-22 DIAGNOSIS — E11649 Type 2 diabetes mellitus with hypoglycemia without coma: Secondary | ICD-10-CM | POA: Diagnosis not present

## 2017-08-22 DIAGNOSIS — R748 Abnormal levels of other serum enzymes: Secondary | ICD-10-CM | POA: Diagnosis not present

## 2017-08-22 DIAGNOSIS — R4020 Unspecified coma: Secondary | ICD-10-CM | POA: Diagnosis not present

## 2017-08-22 DIAGNOSIS — J90 Pleural effusion, not elsewhere classified: Secondary | ICD-10-CM | POA: Diagnosis not present

## 2017-08-22 DIAGNOSIS — G4733 Obstructive sleep apnea (adult) (pediatric): Secondary | ICD-10-CM | POA: Diagnosis present

## 2017-08-22 DIAGNOSIS — Z7401 Bed confinement status: Secondary | ICD-10-CM | POA: Diagnosis not present

## 2017-08-22 DIAGNOSIS — R4182 Altered mental status, unspecified: Secondary | ICD-10-CM | POA: Diagnosis not present

## 2017-08-22 DIAGNOSIS — Z452 Encounter for adjustment and management of vascular access device: Secondary | ICD-10-CM | POA: Diagnosis not present

## 2017-08-22 DIAGNOSIS — K7290 Hepatic failure, unspecified without coma: Secondary | ICD-10-CM | POA: Diagnosis present

## 2017-08-22 DIAGNOSIS — M6281 Muscle weakness (generalized): Secondary | ICD-10-CM | POA: Diagnosis not present

## 2017-08-22 DIAGNOSIS — D631 Anemia in chronic kidney disease: Secondary | ICD-10-CM | POA: Diagnosis not present

## 2017-08-22 DIAGNOSIS — Z85828 Personal history of other malignant neoplasm of skin: Secondary | ICD-10-CM

## 2017-08-22 DIAGNOSIS — Z0189 Encounter for other specified special examinations: Secondary | ICD-10-CM

## 2017-08-22 DIAGNOSIS — Z888 Allergy status to other drugs, medicaments and biological substances status: Secondary | ICD-10-CM

## 2017-08-22 DIAGNOSIS — E0821 Diabetes mellitus due to underlying condition with diabetic nephropathy: Secondary | ICD-10-CM

## 2017-08-22 DIAGNOSIS — C7981 Secondary malignant neoplasm of breast: Secondary | ICD-10-CM | POA: Diagnosis not present

## 2017-08-22 DIAGNOSIS — Z7902 Long term (current) use of antithrombotics/antiplatelets: Secondary | ICD-10-CM

## 2017-08-22 DIAGNOSIS — N183 Chronic kidney disease, stage 3 (moderate): Secondary | ICD-10-CM | POA: Diagnosis not present

## 2017-08-22 DIAGNOSIS — R21 Rash and other nonspecific skin eruption: Secondary | ICD-10-CM | POA: Diagnosis not present

## 2017-08-22 DIAGNOSIS — R6520 Severe sepsis without septic shock: Secondary | ICD-10-CM | POA: Diagnosis present

## 2017-08-22 DIAGNOSIS — A419 Sepsis, unspecified organism: Principal | ICD-10-CM

## 2017-08-22 DIAGNOSIS — I081 Rheumatic disorders of both mitral and tricuspid valves: Secondary | ICD-10-CM | POA: Diagnosis present

## 2017-08-22 DIAGNOSIS — Z01818 Encounter for other preprocedural examination: Secondary | ICD-10-CM

## 2017-08-22 DIAGNOSIS — Z992 Dependence on renal dialysis: Secondary | ICD-10-CM

## 2017-08-22 DIAGNOSIS — E871 Hypo-osmolality and hyponatremia: Secondary | ICD-10-CM | POA: Diagnosis not present

## 2017-08-22 DIAGNOSIS — R000 Tachycardia, unspecified: Secondary | ICD-10-CM | POA: Diagnosis not present

## 2017-08-22 HISTORY — DX: Malignant neoplasm of unspecified site of unspecified female breast: C50.919

## 2017-08-22 LAB — BLOOD GAS, ARTERIAL
ACID-BASE DEFICIT: 5.1 mmol/L — AB (ref 0.0–2.0)
Bicarbonate: 17.4 mmol/L — ABNORMAL LOW (ref 20.0–28.0)
Delivery systems: POSITIVE
Expiratory PAP: 5
FIO2: 0.4
Inspiratory PAP: 14
O2 SAT: 98.4 %
PATIENT TEMPERATURE: 37
PH ART: 7.45 (ref 7.350–7.450)
pCO2 arterial: 25 mmHg — ABNORMAL LOW (ref 32.0–48.0)
pO2, Arterial: 107 mmHg (ref 83.0–108.0)

## 2017-08-22 LAB — URINALYSIS, COMPLETE (UACMP) WITH MICROSCOPIC
BILIRUBIN URINE: NEGATIVE
Glucose, UA: NEGATIVE mg/dL
KETONES UR: NEGATIVE mg/dL
LEUKOCYTES UA: NEGATIVE
NITRITE: NEGATIVE
PROTEIN: NEGATIVE mg/dL
Specific Gravity, Urine: 1.006 (ref 1.005–1.030)
pH: 5 (ref 5.0–8.0)

## 2017-08-22 LAB — COMPREHENSIVE METABOLIC PANEL
ALBUMIN: 3.6 g/dL (ref 3.5–5.0)
ALT: 77 U/L — ABNORMAL HIGH (ref 14–54)
ANION GAP: 13 (ref 5–15)
AST: 108 U/L — ABNORMAL HIGH (ref 15–41)
Alkaline Phosphatase: 78 U/L (ref 38–126)
BUN: 24 mg/dL — ABNORMAL HIGH (ref 6–20)
CO2: 20 mmol/L — ABNORMAL LOW (ref 22–32)
Calcium: 8.5 mg/dL — ABNORMAL LOW (ref 8.9–10.3)
Chloride: 98 mmol/L — ABNORMAL LOW (ref 101–111)
Creatinine, Ser: 1.64 mg/dL — ABNORMAL HIGH (ref 0.44–1.00)
GFR calc non Af Amer: 30 mL/min — ABNORMAL LOW (ref >60)
GFR, EST AFRICAN AMERICAN: 35 mL/min — AB (ref >60)
GLUCOSE: 298 mg/dL — AB (ref 65–99)
POTASSIUM: 3.6 mmol/L (ref 3.5–5.1)
SODIUM: 131 mmol/L — AB (ref 135–145)
TOTAL PROTEIN: 7 g/dL (ref 6.5–8.1)
Total Bilirubin: 1.2 mg/dL (ref 0.3–1.2)

## 2017-08-22 LAB — CBC WITH DIFFERENTIAL/PLATELET
BASOS ABS: 0 10*3/uL (ref 0–0.1)
Basophils Relative: 0 %
Eosinophils Absolute: 0 10*3/uL (ref 0–0.7)
Eosinophils Relative: 0 %
HEMATOCRIT: 36.8 % (ref 35.0–47.0)
HEMOGLOBIN: 12.7 g/dL (ref 12.0–16.0)
LYMPHS PCT: 5 %
Lymphs Abs: 0.2 10*3/uL — ABNORMAL LOW (ref 1.0–3.6)
MCH: 31.7 pg (ref 26.0–34.0)
MCHC: 34.6 g/dL (ref 32.0–36.0)
MCV: 91.7 fL (ref 80.0–100.0)
MONO ABS: 0.1 10*3/uL — AB (ref 0.2–0.9)
Monocytes Relative: 3 %
NEUTROS ABS: 3.7 10*3/uL (ref 1.4–6.5)
NEUTROS PCT: 92 %
Platelets: 92 10*3/uL — ABNORMAL LOW (ref 150–440)
RBC: 4.01 MIL/uL (ref 3.80–5.20)
RDW: 14.8 % — AB (ref 11.5–14.5)
WBC: 4.1 10*3/uL (ref 3.6–11.0)

## 2017-08-22 LAB — BASIC METABOLIC PANEL
Anion gap: 13 (ref 5–15)
BUN: 23 mg/dL — ABNORMAL HIGH (ref 6–20)
CHLORIDE: 104 mmol/L (ref 101–111)
CO2: 14 mmol/L — ABNORMAL LOW (ref 22–32)
CREATININE: 1.47 mg/dL — AB (ref 0.44–1.00)
Calcium: 7.7 mg/dL — ABNORMAL LOW (ref 8.9–10.3)
GFR calc Af Amer: 40 mL/min — ABNORMAL LOW (ref >60)
GFR calc non Af Amer: 34 mL/min — ABNORMAL LOW (ref >60)
Glucose, Bld: 231 mg/dL — ABNORMAL HIGH (ref 65–99)
POTASSIUM: 3.5 mmol/L (ref 3.5–5.1)
SODIUM: 131 mmol/L — AB (ref 135–145)

## 2017-08-22 LAB — TROPONIN I: Troponin I: 0.25 ng/mL (ref <0.03)

## 2017-08-22 LAB — GLUCOSE, CAPILLARY
GLUCOSE-CAPILLARY: 226 mg/dL — AB (ref 65–99)
Glucose-Capillary: 311 mg/dL — ABNORMAL HIGH (ref 65–99)
Glucose-Capillary: 150 mg/dL — ABNORMAL HIGH (ref 65–99)
Glucose-Capillary: 225 mg/dL — ABNORMAL HIGH (ref 65–99)

## 2017-08-22 LAB — LACTIC ACID, PLASMA
LACTIC ACID, VENOUS: 4.9 mmol/L — AB (ref 0.5–1.9)
Lactic Acid, Venous: 3.3 mmol/L (ref 0.5–1.9)

## 2017-08-22 LAB — MAGNESIUM: Magnesium: 1.3 mg/dL — ABNORMAL LOW (ref 1.7–2.4)

## 2017-08-22 LAB — MRSA PCR SCREENING: MRSA by PCR: NEGATIVE

## 2017-08-22 LAB — PHOSPHORUS: Phosphorus: 2.7 mg/dL (ref 2.5–4.6)

## 2017-08-22 MED ORDER — ATORVASTATIN CALCIUM 20 MG PO TABS: 80.0000 mg | ORAL_TABLET | Freq: Every day | ORAL | Status: DC

## 2017-08-22 MED ORDER — ONDANSETRON HCL 4 MG PO TABS: 4.0000 mg | ORAL_TABLET | Freq: Four times a day (QID) | ORAL | Status: DC | PRN

## 2017-08-22 MED ORDER — PIPERACILLIN-TAZOBACTAM 3.375 G IVPB 30 MIN
3.3750 g | Freq: Once | INTRAVENOUS | Status: AC
  Administered 2017-08-22: 3.375 g via INTRAVENOUS
  Filled 2017-08-22: qty 50

## 2017-08-22 MED ORDER — FUROSEMIDE 10 MG/ML IJ SOLN
20.0000 mg | Freq: Once | INTRAMUSCULAR | Status: AC
  Administered 2017-08-22: 20 mg via INTRAVENOUS
  Filled 2017-08-22: qty 2

## 2017-08-22 MED ORDER — LETROZOLE 2.5 MG PO TABS
2.5000 mg | ORAL_TABLET | Freq: Every day | ORAL | Status: DC
  Administered 2017-08-23 – 2017-09-01 (×7): 2.5 mg via ORAL
  Filled 2017-08-22 (×11): qty 1

## 2017-08-22 MED ORDER — PRAVASTATIN SODIUM 20 MG PO TABS
80.0000 mg | ORAL_TABLET | Freq: Every day | ORAL | Status: DC
  Administered 2017-08-23: 80 mg via ORAL
  Filled 2017-08-22: qty 4

## 2017-08-22 MED ORDER — SODIUM CHLORIDE 0.9 % IV SOLN
INTRAVENOUS | Status: DC
  Administered 2017-08-22: 19:00:00 via INTRAVENOUS

## 2017-08-22 MED ORDER — SODIUM CHLORIDE 0.9 % IV BOLUS (SEPSIS)
500.0000 mL | Freq: Once | INTRAVENOUS | Status: AC
  Administered 2017-08-22: 500 mL via INTRAVENOUS

## 2017-08-22 MED ORDER — ASPIRIN EC 81 MG PO TBEC
81.0000 mg | DELAYED_RELEASE_TABLET | Freq: Every day | ORAL | Status: DC
  Administered 2017-08-23 – 2017-08-24 (×2): 81 mg via ORAL
  Filled 2017-08-22 (×2): qty 1

## 2017-08-22 MED ORDER — ACETAMINOPHEN 325 MG PO TABS
650.0000 mg | ORAL_TABLET | Freq: Four times a day (QID) | ORAL | Status: DC | PRN
  Administered 2017-08-23 – 2017-08-31 (×6): 650 mg via ORAL
  Filled 2017-08-22 (×6): qty 2

## 2017-08-22 MED ORDER — PIPERACILLIN-TAZOBACTAM 3.375 G IVPB
3.3750 g | Freq: Three times a day (TID) | INTRAVENOUS | Status: DC
  Administered 2017-08-22 – 2017-08-24 (×5): 3.375 g via INTRAVENOUS
  Filled 2017-08-22 (×5): qty 50

## 2017-08-22 MED ORDER — SODIUM CHLORIDE 0.9 % IV BOLUS (SEPSIS)
1000.0000 mL | Freq: Once | INTRAVENOUS | Status: AC
  Administered 2017-08-22: 1000 mL via INTRAVENOUS

## 2017-08-22 MED ORDER — INSULIN ASPART 100 UNIT/ML ~~LOC~~ SOLN
0.0000 [IU] | Freq: Three times a day (TID) | SUBCUTANEOUS | Status: DC
  Administered 2017-08-22: 18:00:00 1 [IU] via SUBCUTANEOUS
  Administered 2017-08-23: 2 [IU] via SUBCUTANEOUS
  Administered 2017-08-23 (×2): 1 [IU] via SUBCUTANEOUS
  Administered 2017-08-24: 3 [IU] via SUBCUTANEOUS
  Filled 2017-08-22 (×5): qty 1

## 2017-08-22 MED ORDER — HEPARIN SODIUM (PORCINE) 5000 UNIT/ML IJ SOLN
5000.0000 [IU] | Freq: Three times a day (TID) | INTRAMUSCULAR | Status: DC
  Administered 2017-08-22: 17:00:00 5000 [IU] via SUBCUTANEOUS
  Filled 2017-08-22 (×2): qty 1

## 2017-08-22 MED ORDER — CLOPIDOGREL BISULFATE 75 MG PO TABS
75.0000 mg | ORAL_TABLET | Freq: Every day | ORAL | Status: DC
  Administered 2017-08-23 – 2017-08-24 (×2): 75 mg via ORAL
  Filled 2017-08-22 (×2): qty 1

## 2017-08-22 MED ORDER — ONDANSETRON HCL 4 MG/2ML IJ SOLN
4.0000 mg | Freq: Four times a day (QID) | INTRAMUSCULAR | Status: DC | PRN
  Administered 2017-09-05 – 2017-09-07 (×2): 4 mg via INTRAVENOUS
  Filled 2017-08-22: qty 2

## 2017-08-22 MED ORDER — GLIPIZIDE ER 5 MG PO TB24
5.0000 mg | ORAL_TABLET | Freq: Every day | ORAL | Status: DC
  Administered 2017-08-23 – 2017-08-24 (×2): 5 mg via ORAL
  Filled 2017-08-22 (×4): qty 1

## 2017-08-22 MED ORDER — METFORMIN HCL 500 MG PO TABS
1000.0000 mg | ORAL_TABLET | Freq: Two times a day (BID) | ORAL | Status: DC
  Administered 2017-08-22 – 2017-08-23 (×2): 1000 mg via ORAL
  Filled 2017-08-22 (×4): qty 2

## 2017-08-22 MED ORDER — HYDRALAZINE HCL 20 MG/ML IJ SOLN: 10.0000 mg | INTRAMUSCULAR | Status: DC | PRN

## 2017-08-22 MED ORDER — BISACODYL 10 MG RE SUPP
10.0000 mg | Freq: Every day | RECTAL | Status: DC | PRN
  Filled 2017-08-22: qty 1

## 2017-08-22 MED ORDER — MORPHINE SULFATE (PF) 2 MG/ML IV SOLN
2.0000 mg | INTRAVENOUS | Status: DC | PRN
  Administered 2017-08-22 – 2017-08-23 (×5): 2 mg via INTRAVENOUS
  Filled 2017-08-22 (×5): qty 1

## 2017-08-22 MED ORDER — METOPROLOL SUCCINATE ER 50 MG PO TB24
50.0000 mg | ORAL_TABLET | Freq: Every day | ORAL | Status: DC
  Administered 2017-08-24 – 2017-08-28 (×4): 50 mg via ORAL
  Filled 2017-08-22 (×4): qty 1

## 2017-08-22 MED ORDER — IPRATROPIUM-ALBUTEROL 0.5-2.5 (3) MG/3ML IN SOLN
3.0000 mL | Freq: Four times a day (QID) | RESPIRATORY_TRACT | Status: DC
  Administered 2017-08-22 – 2017-08-24 (×5): 3 mL via RESPIRATORY_TRACT
  Filled 2017-08-22: qty 3
  Filled 2017-08-22: qty 42
  Filled 2017-08-22 (×5): qty 3

## 2017-08-22 MED ORDER — ACETAMINOPHEN 325 MG PO TABS
650.0000 mg | ORAL_TABLET | Freq: Once | ORAL | Status: AC
  Administered 2017-08-22: 650 mg via ORAL
  Filled 2017-08-22: qty 2

## 2017-08-22 MED ORDER — DOCUSATE SODIUM 100 MG PO CAPS
100.0000 mg | ORAL_CAPSULE | Freq: Two times a day (BID) | ORAL | Status: DC
  Administered 2017-08-23 – 2017-08-24 (×2): 100 mg via ORAL
  Filled 2017-08-22 (×2): qty 1

## 2017-08-22 MED ORDER — VANCOMYCIN HCL IN DEXTROSE 1-5 GM/200ML-% IV SOLN
1000.0000 mg | INTRAVENOUS | Status: DC
  Administered 2017-08-22: 1000 mg via INTRAVENOUS
  Filled 2017-08-22 (×2): qty 200

## 2017-08-22 MED ORDER — FAMOTIDINE IN NACL 20-0.9 MG/50ML-% IV SOLN
20.0000 mg | Freq: Two times a day (BID) | INTRAVENOUS | Status: DC
Start: 2017-08-22 — End: 2017-08-25
  Administered 2017-08-22 – 2017-08-24 (×5): 20 mg via INTRAVENOUS
  Filled 2017-08-22 (×5): qty 50

## 2017-08-22 MED ORDER — VANCOMYCIN HCL IN DEXTROSE 1-5 GM/200ML-% IV SOLN
1000.0000 mg | Freq: Once | INTRAVENOUS | Status: AC
  Administered 2017-08-22: 1000 mg via INTRAVENOUS
  Filled 2017-08-22: qty 200

## 2017-08-22 MED ORDER — FUROSEMIDE 10 MG/ML IJ SOLN
40.0000 mg | Freq: Once | INTRAMUSCULAR | Status: AC
  Administered 2017-08-22: 40 mg via INTRAVENOUS
  Filled 2017-08-22: qty 4

## 2017-08-22 MED ORDER — VITAMIN D3 25 MCG (1000 UNIT) PO TABS
5000.0000 [IU] | ORAL_TABLET | Freq: Every day | ORAL | Status: DC
  Administered 2017-08-23 – 2017-08-24 (×2): 5000 [IU] via ORAL
  Filled 2017-08-22 (×4): qty 5

## 2017-08-22 MED ORDER — SODIUM CHLORIDE 0.9 % IV SOLN
1000.0000 mL | Freq: Once | INTRAVENOUS | Status: AC
  Administered 2017-08-22: 1000 mL via INTRAVENOUS

## 2017-08-22 MED ORDER — ACETAMINOPHEN 650 MG RE SUPP
650.0000 mg | Freq: Four times a day (QID) | RECTAL | Status: DC | PRN
  Administered 2017-08-25: 650 mg via RECTAL
  Filled 2017-08-22: qty 1

## 2017-08-22 MED ORDER — LOSARTAN POTASSIUM 50 MG PO TABS
50.0000 mg | ORAL_TABLET | Freq: Every day | ORAL | Status: DC
  Administered 2017-08-24: 50 mg via ORAL
  Filled 2017-08-22: qty 1

## 2017-08-22 NOTE — Progress Notes (Signed)
Patient ID: Bonnie Holt, female   DOB: 1945/03/19, 72 y.o.   MRN: [id number]  EKG LBBB which was seen on previous ekg CXR looks more like fluid overload Bipap on and patient urinating for second time.  Case discussed with critical care team.  Case discussed with family in the hallway.  Dr Leslye Peer
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
## 2017-08-22 NOTE — Progress Notes (Signed)
Patient ID: Bonnie Holt, female   DOB: [birth year], 72 y.o.   MRN: [id number]  [employer] PROGRESS NOTE  MEIKO IVES WUJ:[id number] DOB: Sep 27, 1945 DOA: 08/22/2017 PCP: Kirk Ruths, MD  HPI/Subjective: Called for shortness of breath.  Patient received fluid boluses for clinical sepsis admission.  Nursing staff had to put on oxygen.  Patient could not even talk secondary to shortness of breath.  Objective: Vitals:   08/22/17 1920 08/22/17 1954  BP:  (!) 151/105  Pulse:  (!) 129  Resp:  (!) 36  Temp:    SpO2: (!) 86% (!) 81%    Intake/Output Summary (Last 24 hours) at 08/22/2017 2005 Last data filed at 08/22/2017 1923 Gross per 24 hour  Intake 1770 ml  Output -  Net 1770 ml   Filed Weights   08/22/17 1229 08/22/17 1703  Weight: 100.2 kg (221 lb) 105.5 kg (232 lb 9.6 oz)    ROS: Review of Systems  Unable to perform ROS: Acuity of condition   Exam: Physical Exam  Constitutional: She is oriented to person, place, and time.  HENT:  Nose: No mucosal edema.  Mouth/Throat: Oropharyngeal exudate present.  Eyes: Pupils are equal, round, and reactive to light. Lids are normal.  Neck: Carotid bruit is present.  Cardiovascular: S1 normal and S2 normal. Tachycardia present.  Respiratory: Accessory muscle usage present. She is in respiratory distress. She has decreased breath sounds in the right middle field, the right lower field, the left middle field and the left lower field. She has wheezes in the right middle field and the left middle field. She has rales in the left lower field.  Musculoskeletal:       Right ankle: She exhibits swelling.       Left ankle: She exhibits swelling.  Neurological: She is alert and oriented to person, place, and time.  Skin: Skin is dry. No rash noted.  Psychiatric: She has a normal mood and affect.      Data Reviewed: Basic Metabolic Panel: Recent Labs  Lab 08/22/17 1252  NA 131*  K 3.6  CL 98*  CO2 20*  GLUCOSE 298*  BUN  24*  CREATININE 1.64*  CALCIUM 8.5*   Liver Function Tests: Recent Labs  Lab 08/22/17 1252  AST 108*  ALT 77*  ALKPHOS 78  BILITOT 1.2  PROT 7.0  ALBUMIN 3.6   CBC: Recent Labs  Lab 08/22/17 1252  WBC 4.1  NEUTROABS 3.7  HGB 12.7  HCT 36.8  MCV 91.7  PLT 92*    CBG: Recent Labs  Lab 08/22/17 1237 08/22/17 1734  GLUCAP 311* 150*     Studies: Dg Chest Port 1 View  Result Date: 08/22/2017 CLINICAL DATA:  Chills with unsteady gait.  Recent tick bite EXAM: PORTABLE CHEST 1 VIEW COMPARISON:  None. FINDINGS: There is no edema or consolidation. Lungs are mildly hyperexpanded. There is cardiomegaly. Pulmonary vascularity is normal. No adenopathy evident. There is an old healed fracture of the left clavicle. IMPRESSION: Lungs mildly hyperexpanded without edema or consolidation. There is cardiomegaly. No adenopathy evident. Electronically Signed   By: Lowella Grip III M.D.   On: 08/22/2017 13:21    Scheduled Meds: . aspirin EC  81 mg Oral Daily  . cholecalciferol  5,000 Units Oral Daily  . [START ON 08/23/2017] clopidogrel  75 mg Oral Daily  . docusate sodium  100 mg Oral BID  . [START ON 08/23/2017] glipiZIDE  5 mg Oral Q breakfast  . heparin  5,000  Units Subcutaneous Q8H  . insulin aspart  0-9 Units Subcutaneous TID WC  . ipratropium-albuterol  3 mL Nebulization QID  . letrozole  2.5 mg Oral Daily  . [START ON 08/23/2017] losartan  50 mg Oral Daily  . metFORMIN  1,000 mg Oral BID WC  . metoprolol succinate  50 mg Oral Daily  . [START ON 08/23/2017] pravastatin  80 mg Oral Daily   Continuous Infusions: . famotidine (PEPCID) IV    . piperacillin-tazobactam (ZOSYN)  IV    . vancomycin      Assessment/Plan:  1. Acute hypoxic respiratory failure.  Start Bipap and transfer to stepdown unit for further monitoring.   2. Fluid overload.  Stat CXR. Lasix x 1.  Stopped ivf 3. EKG and cardiac enzymes and tele monitoring.  Patient already on metoprolol and aspirin,  plavix. 4. Admitted with sepsis, lactic acidosis. First cxr negative and urine also looked negative.  Repeat cxr  Code Status:     Code Status Orders  (From admission, onward)        Start     Ordered   08/22/17 1612  Full code  Continuous     08/22/17 1611    Code Status History    This patient has a current code status but no historical code status.     Family Communication: husband at the bedside Disposition Plan: transfer to step down unit  Time spent: 32 minutes  [place]

## 2017-08-22 NOTE — Progress Notes (Signed)
Assumed care of patient at 13. Pt was in the bathroom. Off going RN and this Probation officer assisted pt back to bed. Pt got extremely SOB. Her oxygen saturations on RA dropped into the mid 80's. Pt became very dyspneic at rest and exertion. Placed pt on oxygen 2 liters.  Pt was very restless and said she felt like she was smothering.  Notified Dr Leslye Peer. He ordered to stop the IVF, one time dose of 40 IV Lasix , stat EKG. IV Lasix administered.  Shortly thereafter, Dr Leslye Peer came into patient room. Spouse and other family members at bedside. Dr Leslye Peer spoke to the family and remained with the family during the transition to CCU. Respiratory therapist came in to give patient a nebulizer treatment. EKG was performed. This Probation officer assessed pt neuro status and respiratory status. Lungs sounds were diminished. Pt was AAOx4 throughout the transition. Dr Leslye Peer ordered stat portable CXR and transfer pt to stepdown as well as continuous bipap. Pt got up to [facility] x 2. The last time patient was placed on bedpan because she was not able to tolerate getting OOB due to her SOB. Pt given reassurance, family consoled. Report given to CCU RN Melissa Cobb. This Probation officer, respiratory therapist and medical orderly transported pt to CCU 20.

## 2017-08-22 NOTE — ED Notes (Signed)
Patient was assisted to room commode by tech and had a large BM. Urine specimen was not obtained due to contamination per tech's report.

## 2017-08-22 NOTE — Progress Notes (Signed)
Pt OOB to BR on the way back from bathroom pt with labored breathing, o2 sat at 86%-o2 at 2L placed, pt received boluses in the ED, no wheezing or crackles noted, Dr Leslye Peer made aware of the above new order for lasix once and continuous IV fluids discontinued

## 2017-08-22 NOTE — ED Notes (Signed)
Lab called and were unable to run urine specimen due to insufficient amount. Dr. Corky Downs aware.

## 2017-08-22 NOTE — ED Triage Notes (Signed)
C/O feeling ill.  Chills, unsteady gait.  X 3 days.  States last week was bitten by a tick.

## 2017-08-22 NOTE — ED Notes (Signed)
Dr. Corky Downs aware of Lactic acid of 4.9.

## 2017-08-22 NOTE — Progress Notes (Signed)
Pharmacy Antibiotic Note  Bonnie Holt is a 72 y.o. female admitted on 08/22/2017 with sepsis.  Pharmacy has been consulted for Zosyn and vanco dosing.  Plan: Zosyn 3.375 g IV q8h   Vancomycin 1g IV x once followed by vancomycin 1g IV q24h with a 7 hour stack dose.   Estimated vanc trough Cmin=17.7 with a goal vanc trough of 15-17. Will check first vanc trough before 4th dose.   ke 0.033, t1/2 21, Vd 49.1, AdjBW 70.14kg   Height: 5\' 2"  (157.5 cm) Weight: 221 lb (100.2 kg) IBW/kg (Calculated) : 50.1  Temp (24hrs), Avg:103.1 F (39.5 C), Min:103.1 F (39.5 C), Max:103.1 F (39.5 C)  Recent Labs  Lab 08/22/17 1252  WBC 4.1  CREATININE 1.64*  LATICACIDVEN 4.9*    Estimated Creatinine Clearance: 34.3 mL/min (A) (by C-G formula based on SCr of 1.64 mg/dL (H)).    Allergies  Allergen Reactions  . Ace Inhibitors Cough    Antimicrobials this admission: 6/9 Vanco >>  6/9 Zosyn >>   Dose adjustments this admission:   Microbiology results: 6/9 BCx: sent 6/9 UCx: sent   Thank you for allowing pharmacy to be a part of this patient's care.  Candelaria Stagers, PharmD Pharmacy Resident  08/22/2017 3:04 PM

## 2017-08-22 NOTE — Progress Notes (Signed)
Pt noted to be in resp distress. Administered SVN with no improvement. Placed pt on Bipap and transported to CCU without incident.
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
## 2017-08-22 NOTE — Progress Notes (Signed)
Pt unable to perform IS.

## 2017-08-22 NOTE — Progress Notes (Signed)
CODE SEPSIS - PHARMACY COMMUNICATION  **Broad Spectrum Antibiotics should be administered within 1 hour of Sepsis diagnosis**  Time Code Sepsis Called/Page Received: 1241  Antibiotics Ordered: Zosyn and Vanco   Time of 1st antibiotic administration: 1401  Additional action taken by pharmacy: No abx ordered and called nurse ~1330, unsure of what the plan was for antibiotics. Attempted to call MD twice with no answer.    Candelaria Stagers ,PharmD Pharmacy Resident  08/22/2017  12:44 PM

## 2017-08-23 ENCOUNTER — Inpatient Hospital Stay
Admit: 2017-08-23 | Discharge: 2017-08-23 | Disposition: A | Discharge status: Home / Self Care | Payer: PPO | Attending: Adult Health | Admitting: Adult Health

## 2017-08-23 DIAGNOSIS — R7989 Other specified abnormal findings of blood chemistry: Secondary | ICD-10-CM

## 2017-08-23 LAB — GLUCOSE, CAPILLARY
GLUCOSE-CAPILLARY: 163 mg/dL — AB (ref 65–99)
GLUCOSE-CAPILLARY: 132 mg/dL — AB (ref 65–99)
Glucose-Capillary: 122 mg/dL — ABNORMAL HIGH (ref 65–99)
Glucose-Capillary: 89 mg/dL (ref 65–99)

## 2017-08-23 LAB — PROTIME-INR
INR: 1.08
Prothrombin Time: 13.9 seconds (ref 11.4–15.2)

## 2017-08-23 LAB — COMPREHENSIVE METABOLIC PANEL
ALBUMIN: 3 g/dL — AB (ref 3.5–5.0)
ALK PHOS: 63 U/L (ref 38–126)
ALT: 87 U/L — ABNORMAL HIGH (ref 14–54)
ANION GAP: 10 (ref 5–15)
AST: 142 U/L — ABNORMAL HIGH (ref 15–41)
BILIRUBIN TOTAL: 1 mg/dL (ref 0.3–1.2)
BUN: 25 mg/dL — AB (ref 6–20)
CALCIUM: 7.5 mg/dL — AB (ref 8.9–10.3)
CO2: 19 mmol/L — ABNORMAL LOW (ref 22–32)
Chloride: 103 mmol/L (ref 101–111)
Creatinine, Ser: 1.49 mg/dL — ABNORMAL HIGH (ref 0.44–1.00)
GFR calc Af Amer: 39 mL/min — ABNORMAL LOW (ref >60)
GFR, EST NON AFRICAN AMERICAN: 34 mL/min — AB (ref >60)
GLUCOSE: 220 mg/dL — AB (ref 65–99)
Potassium: 3.2 mmol/L — ABNORMAL LOW (ref 3.5–5.1)
Sodium: 132 mmol/L — ABNORMAL LOW (ref 135–145)
TOTAL PROTEIN: 5.8 g/dL — AB (ref 6.5–8.1)

## 2017-08-23 LAB — CBC
HCT: 32.1 % — ABNORMAL LOW (ref 35.0–47.0)
Hemoglobin: 11.4 g/dL — ABNORMAL LOW (ref 12.0–16.0)
MCH: 32.2 pg (ref 26.0–34.0)
MCHC: 35.6 g/dL (ref 32.0–36.0)
MCV: 90.4 fL (ref 80.0–100.0)
Platelets: 55 10*3/uL — ABNORMAL LOW (ref 150–440)
RBC: 3.55 MIL/uL — ABNORMAL LOW (ref 3.80–5.20)
RDW: 14.7 % — AB (ref 11.5–14.5)
WBC: 3.5 10*3/uL — AB (ref 3.6–11.0)

## 2017-08-23 LAB — TROPONIN I
TROPONIN I: 1.73 ng/mL — AB (ref <0.03)
TROPONIN I: 2.33 ng/mL — AB (ref <0.03)
TROPONIN I: 1.14 ng/mL — AB (ref <0.03)
Troponin I: 2.02 ng/mL (ref <0.03)

## 2017-08-23 LAB — LACTIC ACID, PLASMA: LACTIC ACID, VENOUS: 1.8 mmol/L (ref 0.5–1.9)

## 2017-08-23 LAB — HEPARIN LEVEL (UNFRACTIONATED): Heparin Unfractionated: 0.82 IU/mL — ABNORMAL HIGH (ref 0.30–0.70)

## 2017-08-23 LAB — APTT: aPTT: 35 seconds (ref 24–36)

## 2017-08-23 MED ORDER — DEXTROSE 5 % IV SOLN
0.0000 ug/min | INTRAVENOUS | Status: DC
  Filled 2017-08-23: qty 1

## 2017-08-23 MED ORDER — HEPARIN BOLUS VIA INFUSION
2000.0000 [IU] | Freq: Once | INTRAVENOUS | Status: AC
  Administered 2017-08-23: 2000 [IU] via INTRAVENOUS
  Filled 2017-08-23: qty 2000

## 2017-08-23 MED ORDER — POTASSIUM CHLORIDE CRYS ER 20 MEQ PO TBCR
40.0000 meq | EXTENDED_RELEASE_TABLET | Freq: Once | ORAL | Status: AC
  Administered 2017-08-23: 40 meq via ORAL
  Filled 2017-08-23: qty 2

## 2017-08-23 MED ORDER — HALOPERIDOL LACTATE 5 MG/ML IJ SOLN
1.0000 mg | INTRAMUSCULAR | Status: DC | PRN
  Administered 2017-08-23: 1 mg via INTRAVENOUS
  Filled 2017-08-23: qty 1

## 2017-08-23 MED ORDER — MAGNESIUM SULFATE 2 GM/50ML IV SOLN
2.0000 g | Freq: Once | INTRAVENOUS | Status: AC
  Administered 2017-08-23: 2 g via INTRAVENOUS
  Filled 2017-08-23: qty 50

## 2017-08-23 MED ORDER — PHENYLEPHRINE HCL-NACL 10-0.9 MG/250ML-% IV SOLN
0.0000 ug/min | INTRAVENOUS | Status: DC
  Administered 2017-08-23: 10 ug/min via INTRAVENOUS
  Filled 2017-08-23: qty 250

## 2017-08-23 MED ORDER — MAGNESIUM OXIDE 400 (241.3 MG) MG PO TABS
400.0000 mg | ORAL_TABLET | Freq: Every day | ORAL | Status: DC
  Administered 2017-08-23 – 2017-08-24 (×2): 400 mg via ORAL
  Filled 2017-08-23 (×2): qty 1

## 2017-08-23 MED ORDER — HEPARIN (PORCINE) IN NACL 100-0.45 UNIT/ML-% IJ SOLN
900.0000 [IU]/h | INTRAMUSCULAR | Status: DC
  Administered 2017-08-23: 1050 [IU]/h via INTRAVENOUS
  Filled 2017-08-23: qty 250

## 2017-08-23 MED ORDER — ATORVASTATIN CALCIUM 20 MG PO TABS
80.0000 mg | ORAL_TABLET | Freq: Every day | ORAL | Status: DC
  Administered 2017-08-23 – 2017-08-24 (×2): 80 mg via ORAL
  Filled 2017-08-23 (×2): qty 4

## 2017-08-23 NOTE — Progress Notes (Signed)
Patient received from 1C. A&O x'3. Shortness of breath, tachypnea, elevated bp Pt currently on bipap. RT at bedside.
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
## 2017-08-23 NOTE — Progress Notes (Signed)
Heparin gtt and Neo infusing per order. Heparin bolus given per order from bag. Continue to monitor.
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
## 2017-08-23 NOTE — Progress Notes (Signed)
Maggie, NP notified of elevated troponin 1.74. Acknowledged, no new orders at present.

## 2017-08-23 NOTE — Progress Notes (Signed)
[facility] Progress Note Patient Name: Bonnie Holt DOB: 10/06/1945 MRN: [id number]   Date of Service  08/23/2017  HPI/Events of Note  Delirium - Request for anxiety medication. Patient is on BiPAP. QTc interval = 0.46 seconds.   eICU Interventions  Will order: 1. Haldol 1 mg IV Q 4 hours PRN agitation.  2. Monitor QTc interval Q 6 hours. Notify MD if QTc interval > 500 milliseconds.      Intervention Category Major Interventions: Delirium, psychosis, severe agitation - evaluation and management  Sommer,Steven Eugene 08/23/2017, 9:12 PM

## 2017-08-23 NOTE — Progress Notes (Signed)
Maggie, NP notified of platelets down to 55. Also magnesium at 1.3 and potassium at 3.2, new orders received for magnesium IV 2gm once and 29meq PO potassium once. Place patient on oxygen at 2LPM to see if she can tolerate being off bipap and taking po medications, if so leave her on [place]. Order obtained for repeat ECG this AM. Also, verified heparin gtt to continue per order with platelets at 55. Cardiology consult order obtained.

## 2017-08-23 NOTE — Progress Notes (Signed)
Maggie, NP notified of elevated troponin 1.14, acknowledged.

## 2017-08-23 NOTE — Progress Notes (Signed)
Maggie, NP notified of patients arrival to bICU bed 20 and patient elevated troponin at 0.25. Acknowledged. Maggie, NP to enter new orders.

## 2017-08-23 NOTE — Progress Notes (Signed)
[facility] [place] NAME: Clarita Mcelvain    MR#:  [id number]  DATE OF BIRTH:  10-17-1945  SUBJECTIVE:  patient feels a lot better today more awake. Family in the room. Breathing improved she is off BiPAP.  REVIEW OF SYSTEMS:   Review of Systems  Constitutional: Negative for chills, fever and weight loss.  HENT: Negative for ear discharge, ear pain and nosebleeds.   Eyes: Negative for blurred vision, pain and discharge.  Respiratory: Positive for shortness of breath. Negative for sputum production, wheezing and stridor.   Cardiovascular: Positive for leg swelling and PND. Negative for chest pain, palpitations and orthopnea.  Gastrointestinal: Negative for abdominal pain, diarrhea, nausea and vomiting.  Genitourinary: Negative for frequency and urgency.  Musculoskeletal: Negative for back pain and joint pain.  Neurological: Negative for sensory change, speech change, focal weakness and weakness.  Psychiatric/Behavioral: Negative for depression and hallucinations. The patient is not nervous/anxious.    Tolerating Diet:yesTolerating PT: pending  DRUG ALLERGIES:   Allergies  Allergen Reactions  . Ace Inhibitors Cough    VITALS:  Blood pressure (!) 139/114, pulse (!) 118, temperature 99.5 F (37.5 C), temperature source Oral, resp. rate 19, height 5\' 2"  (1.575 m), weight 103.3 kg (227 lb 11.8 oz), SpO2 98 %.  PHYSICAL EXAMINATION:   Physical Exam  GENERAL:  72 y.o.-year-old patient lying in the bed with no acute distress. Obese EYES: Pupils equal, round, reactive to light and accommodation. No scleral icterus. Extraocular muscles intact.  HEENT: Head atraumatic, normocephalic. Oropharynx and nasopharynx clear.  NECK:  Supple, no jugular venous distention. No thyroid enlargement, no tenderness.  LUNGS: Normal breath sounds bilaterally, no wheezing, rales, rhonchi. No use of accessory muscles of respiration.  CARDIOVASCULAR: S1, S2  normal. No murmurs, rubs, or gallops.  ABDOMEN: Soft, nontender, nondistended. Bowel sounds present. No organomegaly or mass.  EXTREMITIES: No cyanosis, clubbing or edema b/l.    NEUROLOGIC: Cranial nerves II through XII are intact. No focal Motor or sensory deficits b/l.   PSYCHIATRIC:  patient is alert and oriented x 3.  SKIN: No obvious rash, lesion, or ulcer.   LABORATORY PANEL:  CBC Recent Labs  Lab 08/23/17 0304  WBC 3.5*  HGB 11.4*  HCT 32.1*  PLT 55*    Chemistries  Recent Labs  Lab 08/22/17 2050 08/23/17 0304  NA 131* 132*  K 3.5 3.2*  CL 104 103  CO2 14* 19*  GLUCOSE 231* 220*  BUN 23* 25*  CREATININE 1.47* 1.49*  CALCIUM 7.7* 7.5*  MG 1.3*  --   AST  --  142*  ALT  --  87*  ALKPHOS  --  63  BILITOT  --  1.0   Cardiac Enzymes Recent Labs  Lab 08/23/17 1315  TROPONINI 2.02*   RADIOLOGY:  Dg Chest Port 1 View  Result Date: 08/22/2017 CLINICAL DATA:  Acute onset of shortness of breath. EXAM: PORTABLE CHEST 1 VIEW COMPARISON:  Radiograph earlier this day. FINDINGS: Unchanged cardiomegaly and mediastinal contours. Development of pulmonary edema and small pleural effusions from prior exam. No confluent airspace disease. No pneumothorax. No acute osseous abnormalities. IMPRESSION: Development of pulmonary edema and small pleural effusions from exam earlier this day consistent with CHF. Electronically Signed   By: Jeb Levering M.D.   On: 08/22/2017 21:00   Dg Chest Port 1 View  Result Date: 08/22/2017 CLINICAL DATA:  Chills with unsteady gait.  Recent tick bite EXAM: PORTABLE CHEST 1 VIEW COMPARISON:  None. FINDINGS: There is no edema or consolidation. Lungs are mildly hyperexpanded. There is cardiomegaly. Pulmonary vascularity is normal. No adenopathy evident. There is an old healed fracture of the left clavicle. IMPRESSION: Lungs mildly hyperexpanded without edema or consolidation. There is cardiomegaly. No adenopathy evident. Electronically Signed   By:  Lowella Grip III M.D.   On: 08/22/2017 13:21   ASSESSMENT AND PLAN:   SHANAE LUO is a 72 y.o. female has a past medical history significant for DM, HTN, and CAD now with 3 day hx of fever, chills, and back apin. Now confused and lethargic. In ER, pt febrile and tachycardic. Lactic acid elevated c/w with sepsis. UA abnormal and CXR negative  1. clinical sepsis secondary to likely UTI -patient currently on IV Zosyn -received vancomycin-- now discontinued -IV fluids -urine culture positive for Klebsiella pneumonia and Proteus -blood culture is negative  2. acute hypoxic respiratory failure secondary to pulmonary edema in the setting of IV fluid volume overload -patient was transferred to ICU step down for BiPAP. Feels a lot better. Diabetes did well. -She was placed on IV for now weaning it to off -wean oxygen as needed  3. Hypokalemia replete  4. Elevated troponin could be demand ischemia with acute pulmonary edema in the setting of sepsis -patient denies any chest pain -cardiology consultation placed -continue aspirin, losartan, metoprolol  5. Type II diabetes continue home meds along with sliding scale  6. DVT prophylaxis heparin  Case discussed with Care Management/Social Worker. Management plans discussed with the patient, family and they are in agreement.  CODE STATUS: full  DVT Prophylaxis: hepairn  TOTAL TIME TAKING CARE OF THIS PATIENT: 30 mins  >50% time spent on counselling and coordination of care  POSSIBLE D/C IN *1-2* DAYS, DEPENDING ON CLINICAL CONDITION.  Note: This dictation was prepared with Dragon dictation along with smaller phrase technology. Any transcriptional errors that result from this process are unintentional.  Fritzi Mandes M.D on 08/23/2017 at 8:05 PM  Between 7am to 6pm - Pager - [phone]  After 6pm go to [email or web address] - password [email or web address] [facility]  Office  [phone]  CC: Primary care physician; Kirk Ruths, MDPatient ID: [name], female   DOB: [birth year], 72 y.o.   MRN: [id number]

## 2017-08-24 ENCOUNTER — Inpatient Hospital Stay: Payer: PPO

## 2017-08-24 DIAGNOSIS — A419 Sepsis, unspecified organism: Principal | ICD-10-CM

## 2017-08-24 DIAGNOSIS — B961 Klebsiella pneumoniae [K. pneumoniae] as the cause of diseases classified elsewhere: Secondary | ICD-10-CM

## 2017-08-24 DIAGNOSIS — N390 Urinary tract infection, site not specified: Secondary | ICD-10-CM

## 2017-08-24 DIAGNOSIS — J810 Acute pulmonary edema: Secondary | ICD-10-CM

## 2017-08-24 LAB — CBC
HCT: 30.4 % — ABNORMAL LOW (ref 35.0–47.0)
Hemoglobin: 10.6 g/dL — ABNORMAL LOW (ref 12.0–16.0)
MCH: 31.8 pg (ref 26.0–34.0)
MCHC: 35 g/dL (ref 32.0–36.0)
MCV: 90.9 fL (ref 80.0–100.0)
Platelets: 43 K/uL — ABNORMAL LOW (ref 150–440)
RBC: 3.34 MIL/uL — ABNORMAL LOW (ref 3.80–5.20)
RDW: 14.7 % — ABNORMAL HIGH (ref 11.5–14.5)
WBC: 2.7 K/uL — ABNORMAL LOW (ref 3.6–11.0)

## 2017-08-24 LAB — GLUCOSE, CAPILLARY
GLUCOSE-CAPILLARY: 94 mg/dL (ref 65–99)
GLUCOSE-CAPILLARY: 207 mg/dL — AB (ref 65–99)
GLUCOSE-CAPILLARY: 87 mg/dL (ref 65–99)
Glucose-Capillary: 163 mg/dL — ABNORMAL HIGH (ref 65–99)

## 2017-08-24 LAB — URINE CULTURE: Culture: >=100000 — AB

## 2017-08-24 MED ORDER — MORPHINE SULFATE (PF) 2 MG/ML IV SOLN
2.0000 mg | Freq: Four times a day (QID) | INTRAVENOUS | Status: DC | PRN
Start: 2017-08-24 — End: 2017-08-25
  Administered 2017-08-24 (×2): 2 mg via INTRAVENOUS
  Filled 2017-08-24 (×2): qty 1

## 2017-08-24 MED ORDER — TRAMADOL HCL 50 MG PO TABS: 50.0000 mg | ORAL_TABLET | Freq: Four times a day (QID) | ORAL | Status: DC | PRN

## 2017-08-24 MED ORDER — VITAMIN D 1000 UNITS PO TABS: 5000.0000 [IU] | ORAL_TABLET | Freq: Every day | ORAL | Status: DC

## 2017-08-24 MED ORDER — HALOPERIDOL LACTATE 5 MG/ML IJ SOLN
2.5000 mg | Freq: Once | INTRAMUSCULAR | Status: AC
  Administered 2017-08-24: 2.5 mg via INTRAVENOUS
  Filled 2017-08-24: qty 1

## 2017-08-24 MED ORDER — IPRATROPIUM-ALBUTEROL 0.5-2.5 (3) MG/3ML IN SOLN
3.0000 mL | Freq: Four times a day (QID) | RESPIRATORY_TRACT | Status: DC | PRN
  Filled 2017-08-24: qty 3

## 2017-08-24 MED ORDER — CEFAZOLIN SODIUM-DEXTROSE 1-4 GM/50ML-% IV SOLN
1.0000 g | Freq: Two times a day (BID) | INTRAVENOUS | Status: DC
  Administered 2017-08-24 (×2): 1 g via INTRAVENOUS
  Filled 2017-08-24 (×5): qty 50

## 2017-08-24 MED ORDER — INSULIN ASPART 100 UNIT/ML ~~LOC~~ SOLN
0.0000 [IU] | Freq: Three times a day (TID) | SUBCUTANEOUS | Status: DC
  Administered 2017-08-24: 3 [IU] via SUBCUTANEOUS

## 2017-08-24 MED ORDER — INSULIN ASPART 100 UNIT/ML ~~LOC~~ SOLN: 0.0000 [IU] | Freq: Every day | SUBCUTANEOUS | Status: DC

## 2017-08-24 NOTE — Progress Notes (Signed)
Pharmacy Antibiotic Note  Bonnie Holt is a 72 y.o. female admitted on 08/22/2017 with sepsis.  Pharmacy has been consulted for Zosyn and vanco dosing.  6/11 Zosyn discontinued. Pharmacy consulted for Cefazolin dosing for UTI.   Plan: Start Cefazolin 1g IV every 12 hours.   Height: 5\' 2"  (157.5 cm) Weight: 225 lb 12 oz (102.4 kg) IBW/kg (Calculated) : 50.1  Temp (24hrs), Avg:100.8 F (38.2 C), Min:99.5 F (37.5 C), Max:103.1 F (39.5 C)  Recent Labs  Lab 08/22/17 1252 08/22/17 1555 08/22/17 2050 08/23/17 0304 08/24/17 0318  WBC 4.1  --   --  3.5* 2.7*  CREATININE 1.64*  --  1.47* 1.49*  --   LATICACIDVEN 4.9* 3.3*  --  1.8  --     Estimated Creatinine Clearance: 38.3 mL/min (A) (by C-G formula based on SCr of 1.49 mg/dL (H)).    Allergies  Allergen Reactions  . Ace Inhibitors Cough    Antimicrobials this admission: 6/9 Vanco >> 6/10 6/9 Zosyn >> 6/11 6/11 Cefazolin >>  Dose adjustments this admission:  Microbiology results: 6/9 BCx: sent 6/9 UCx: >100,000 Klebsiella pneumoniae                   60,000 Proteus mirabilis    Thank you for allowing pharmacy to be a part of this patient's care.  Pernell Dupre, PharmD, BCPS Clinical Pharmacist 08/24/2017 12:28 PM

## 2017-08-24 NOTE — Progress Notes (Signed)
Pt had an unwitnessed fall no obvious signs of head trauma and pt denied hitting her head, however ordered CT Head.  She is currently oriented to self only, follows commands, PERRLA, moderate bilateral upper extremity strength, lungs diminished throughout, and vss. Telesitter order placed will continue to monitor and assess pt.  Marda Stalker, [place] Pager [phone] (please enter 7 digits) PCCM Consult Pager [phone] (please enter 7 digits)

## 2017-08-24 NOTE — Progress Notes (Signed)
[facility] NAME: Bonnie Holt    MR#:  [id number]  DATE OF BIRTH:  1945-09-17  SUBJECTIVE:  patient feels a lot better today more awake. Family in the room. Complains of back pain REVIEW OF SYSTEMS:   Review of Systems  Constitutional: Negative for chills, fever and weight loss.  HENT: Negative for ear discharge, ear pain and nosebleeds.   Eyes: Negative for blurred vision, pain and discharge.  Respiratory: Positive for shortness of breath. Negative for sputum production, wheezing and stridor.   Cardiovascular: Positive for leg swelling and PND. Negative for chest pain, palpitations and orthopnea.  Gastrointestinal: Negative for abdominal pain, diarrhea, nausea and vomiting.  Genitourinary: Negative for frequency and urgency.  Musculoskeletal: Negative for back pain and joint pain.  Neurological: Negative for sensory change, speech change, focal weakness and weakness.  Psychiatric/Behavioral: Negative for depression and hallucinations. The patient is not nervous/anxious.    Tolerating Diet:yesTolerating PT: pending  DRUG ALLERGIES:   Allergies  Allergen Reactions  . Ace Inhibitors Cough    VITALS:  Blood pressure 118/74, pulse (!) 128, temperature 99.6 F (37.6 C), temperature source Oral, resp. rate (!) 26, height 5\' 2"  (1.575 m), weight 102.4 kg (225 lb 12 oz), SpO2 97 %.  PHYSICAL EXAMINATION:   Physical Exam  GENERAL:  72 y.o.-year-old patient lying in the bed with no acute distress. Obese EYES: Pupils equal, round, reactive to light and accommodation. No scleral icterus. Extraocular muscles intact.  HEENT: Head atraumatic, normocephalic. Oropharynx and nasopharynx clear.  NECK:  Supple, no jugular venous distention. No thyroid enlargement, no tenderness.  LUNGS: Normal breath sounds bilaterally, no wheezing, rales, rhonchi. No use of accessory muscles of respiration.  CARDIOVASCULAR: S1, S2 normal. No  murmurs, rubs, or gallops.  ABDOMEN: Soft, nontender, nondistended. Bowel sounds present. No organomegaly or mass.  EXTREMITIES: No cyanosis, clubbing or edema b/l.    NEUROLOGIC: Cranial nerves II through XII are intact. No focal Motor or sensory deficits b/l.   PSYCHIATRIC:  patient is alert and oriented x 3.  SKIN: No obvious rash, lesion, or ulcer.   LABORATORY PANEL:  CBC Recent Labs  Lab 08/24/17 0318  WBC 2.7*  HGB 10.6*  HCT 30.4*  PLT 43*    Chemistries  Recent Labs  Lab 08/22/17 2050 08/23/17 0304  NA 131* 132*  K 3.5 3.2*  CL 104 103  CO2 14* 19*  GLUCOSE 231* 220*  BUN 23* 25*  CREATININE 1.47* 1.49*  CALCIUM 7.7* 7.5*  MG 1.3*  --   AST  --  142*  ALT  --  87*  ALKPHOS  --  63  BILITOT  --  1.0   Cardiac Enzymes Recent Labs  Lab 08/23/17 1315  TROPONINI 2.02*   RADIOLOGY:  Dg Chest Port 1 View  Result Date: 08/22/2017 CLINICAL DATA:  Acute onset of shortness of breath. EXAM: PORTABLE CHEST 1 VIEW COMPARISON:  Radiograph earlier this day. FINDINGS: Unchanged cardiomegaly and mediastinal contours. Development of pulmonary edema and small pleural effusions from prior exam. No confluent airspace disease. No pneumothorax. No acute osseous abnormalities. IMPRESSION: Development of pulmonary edema and small pleural effusions from exam earlier this day consistent with CHF. Electronically Signed   By: Jeb Levering M.D.   On: 08/22/2017 21:00   ASSESSMENT AND PLAN:   Bonnie Holt is a 72 y.o. female has a past medical history significant for DM, HTN, and CAD now with 3 day hx  of fever, chills, and back apin. Now confused and lethargic. In ER, pt febrile and tachycardic. Lactic acid elevated c/w with sepsis. UA abnormal and CXR negative  1. clinical sepsis secondary to likely UTI -patient currently on IV Zosyn-- change to IV cefazolin -IV fluids -urine culture positive for Klebsiella pneumonia and Proteus -blood culture is negative  2. acute hypoxic  respiratory failure secondary to pulmonary edema in the setting of IV fluid volume overload -patient was transferred to ICU step down for BiPAP. Feels a lot better -wean oxygen as needed  3. Hypokalemia repleted  4. Elevated troponin could be demand ischemia with acute pulmonary edema in the setting of sepsis -patient denies any chest pain -cardiology consultation placed -continue aspirin, losartan, metoprolol  5. Type II diabetes continue home meds along with sliding scale  6. DVT prophylaxis heparin  Case discussed with Care Management/Social Worker. Management plans discussed with the patient, family and they are in agreement.  CODE STATUS: full  DVT Prophylaxis: hepairn  TOTAL TIME TAKING CARE OF THIS PATIENT: 30 mins  >50% time spent on counselling and coordination of care  POSSIBLE D/C IN *1-2* DAYS, DEPENDING ON CLINICAL CONDITION.  Note: This dictation was prepared with Dragon dictation along with smaller phrase technology. Any transcriptional errors that result from this process are unintentional.  Fritzi Mandes M.D on 08/24/2017 at 2:32 PM  Between 7am to 6pm - Pager - [phone]  After 6pm go to [email or web address] - password [email or web address] [facility]  Office  [phone]  CC: Primary care physician; Kirk Ruths, MDPatient ID: Bonnie Holt, female   DOB: [birth year], 72 y.o.   MRN: [id number]
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
## 2017-08-24 NOTE — Progress Notes (Signed)
Cognition intact Wants to get out of bed Off vasopressors No new complaints  Vitals:   08/24/17 0400 08/24/17 0500 08/24/17 0600 08/24/17 0921  BP: (!) 110/46  (!) 128/46 118/74  Pulse: (!) 122 (!) 133 (!) 128 (!) 128  Resp: (!) 23 16 (!) 26   Temp: 99.6 F (37.6 C)     TempSrc: Oral     SpO2: 95% 96% 97%   Weight:  225 lb 12 oz (102.4 kg)    Height:       Gen: Appears fatigued, NAD HEENT: NCAT, sclera white Neck: No JVD noted Lungs: breath sounds diminished without wheezes or other adventitious sounds noted Cardiovascular: RRR, no murmurs Abdomen: Obese, soft, nontender, normal BS Ext: without clubbing, cyanosis.  Symmetric LE edema Neuro: No focal deficits Skin: Limited exam, no lesions noted  BMP Latest Ref Rng & Units 08/23/2017 08/22/2017 08/22/2017  Glucose 65 - 99 mg/dL 220(H) 231(H) 298(H)  BUN 6 - 20 mg/dL 25(H) 23(H) 24(H)  Creatinine 0.44 - 1.00 mg/dL 1.49(H) 1.47(H) 1.64(H)  Sodium 135 - 145 mmol/L 132(L) 131(L) 131(L)  Potassium 3.5 - 5.1 mmol/L 3.2(L) 3.5 3.6  Chloride 101 - 111 mmol/L 103 104 98(L)  CO2 22 - 32 mmol/L 19(L) 14(L) 20(L)  Calcium 8.9 - 10.3 mg/dL 7.5(L) 7.7(L) 8.5(L)   CBC Latest Ref Rng & Units 08/24/2017 08/23/2017 08/22/2017  WBC 3.6 - 11.0 K/uL 2.7(L) 3.5(L) 4.1  Hemoglobin 12.0 - 16.0 g/dL 10.6(L) 11.4(L) 12.7  Hematocrit 35.0 - 47.0 % 30.4(L) 32.1(L) 36.8  Platelets 150 - 440 K/uL 43(L) 55(L) 92(L)   CXR: No new film  IMP: Acute hypoxemic respiratory failure Resolving pulmonary edema OSA History of CAD Mildly elevated troponin I Chronic LBBB AKI, nonoliguric Resolving severe sepsis Klebsiella UTI Type 2 diabetes Thrombocytopenia History of breast cancer Acute encephalopathy, resolved  PLAN/REC: Continue supplemental oxygen Change bronchodilators to as needed only Continue nocturnal CPAP Continue hemodynamic monitoring Cardiology consultation requested Discontinue losartan Antibiotic coverage narrowed to cefazolin  6/11 Continue glipizide and SSI DVT px: SCDs Mobilize out of bed Monitor CBC intermittently Transfuse per usual guidelines  Monitor in SDU through today  Merton Border, MD PCCM service Mobile [phone] Pager [phone] 08/24/2017 4:24 PM

## 2017-08-24 NOTE — Progress Notes (Signed)
Patient found on floor by staff at [facility].  Placed back in bed and assessed for injury.  Denies hitting head.   Hinton Dyer, NP on scene with other staff.  CT of head ordered and completed.  Administrative Coordinator informed.  Attempted to contact spouse about fall but there was no answer.  Currently resting comfortably in bed.  Telesitter will be set up directly.  Will continue to monitor.

## 2017-08-25 ENCOUNTER — Inpatient Hospital Stay: Payer: Self-pay

## 2017-08-25 ENCOUNTER — Inpatient Hospital Stay
Admit: 2017-08-25 | Discharge: 2017-08-25 | Disposition: A | Discharge status: Home / Self Care | Payer: PPO | Attending: Cardiology | Admitting: Cardiology

## 2017-08-25 ENCOUNTER — Inpatient Hospital Stay: Payer: PPO

## 2017-08-25 DIAGNOSIS — R6520 Severe sepsis without septic shock: Secondary | ICD-10-CM

## 2017-08-25 DIAGNOSIS — J9601 Acute respiratory failure with hypoxia: Secondary | ICD-10-CM

## 2017-08-25 LAB — CBC
HEMATOCRIT: 29 % — AB (ref 35.0–47.0)
Hemoglobin: 10.2 g/dL — ABNORMAL LOW (ref 12.0–16.0)
MCH: 32.4 pg (ref 26.0–34.0)
MCHC: 35.3 g/dL (ref 32.0–36.0)
MCV: 91.8 fL (ref 80.0–100.0)
Platelets: 40 10*3/uL — ABNORMAL LOW (ref 150–440)
RBC: 3.16 MIL/uL — ABNORMAL LOW (ref 3.80–5.20)
RDW: 15.4 % — AB (ref 11.5–14.5)
WBC: 3.5 10*3/uL — AB (ref 3.6–11.0)

## 2017-08-25 LAB — COMPREHENSIVE METABOLIC PANEL
ALBUMIN: 2.9 g/dL — AB (ref 3.5–5.0)
ALT: 104 U/L — ABNORMAL HIGH (ref 14–54)
ANION GAP: 13 (ref 5–15)
AST: 240 U/L — AB (ref 15–41)
Alkaline Phosphatase: 62 U/L (ref 38–126)
BILIRUBIN TOTAL: 0.8 mg/dL (ref 0.3–1.2)
BUN: 38 mg/dL — AB (ref 6–20)
CHLORIDE: 99 mmol/L — AB (ref 101–111)
CO2: 17 mmol/L — ABNORMAL LOW (ref 22–32)
Calcium: 7.5 mg/dL — ABNORMAL LOW (ref 8.9–10.3)
Creatinine, Ser: 3.15 mg/dL — ABNORMAL HIGH (ref 0.44–1.00)
GFR calc Af Amer: 16 mL/min — ABNORMAL LOW (ref >60)
GFR calc non Af Amer: 14 mL/min — ABNORMAL LOW (ref >60)
GLUCOSE: 116 mg/dL — AB (ref 65–99)
Potassium: 3.8 mmol/L (ref 3.5–5.1)
Sodium: 129 mmol/L — ABNORMAL LOW (ref 135–145)
TOTAL PROTEIN: 5.9 g/dL — AB (ref 6.5–8.1)

## 2017-08-25 LAB — GLUCOSE, CAPILLARY
GLUCOSE-CAPILLARY: 61 mg/dL — AB (ref 65–99)
GLUCOSE-CAPILLARY: 64 mg/dL — AB (ref 65–99)
GLUCOSE-CAPILLARY: 65 mg/dL (ref 65–99)
GLUCOSE-CAPILLARY: 68 mg/dL (ref 65–99)
GLUCOSE-CAPILLARY: 98 mg/dL (ref 65–99)
Glucose-Capillary: 97 mg/dL (ref 65–99)
Glucose-Capillary: 83 mg/dL (ref 65–99)
Glucose-Capillary: 136 mg/dL — ABNORMAL HIGH (ref 65–99)

## 2017-08-25 LAB — BASIC METABOLIC PANEL
Anion gap: 10 (ref 5–15)
BUN: 47 mg/dL — AB (ref 6–20)
CALCIUM: 7.1 mg/dL — AB (ref 8.9–10.3)
CO2: 16 mmol/L — ABNORMAL LOW (ref 22–32)
CREATININE: 4.03 mg/dL — AB (ref 0.44–1.00)
Chloride: 102 mmol/L (ref 101–111)
GFR calc Af Amer: 12 mL/min — ABNORMAL LOW (ref >60)
GFR, EST NON AFRICAN AMERICAN: 10 mL/min — AB (ref >60)
GLUCOSE: 71 mg/dL (ref 65–99)
POTASSIUM: 3.9 mmol/L (ref 3.5–5.1)
SODIUM: 128 mmol/L — AB (ref 135–145)

## 2017-08-25 LAB — ECHOCARDIOGRAM COMPLETE
HEIGHTINCHES: 62 in
WEIGHTICAEL: 3612.02 [oz_av]

## 2017-08-25 MED ORDER — SODIUM CHLORIDE 0.9 % IV SOLN
100.0000 mg | Freq: Two times a day (BID) | INTRAVENOUS | Status: AC
  Administered 2017-08-25 – 2017-09-04 (×20): 100 mg via INTRAVENOUS
  Filled 2017-08-25 (×20): qty 100

## 2017-08-25 MED ORDER — DEXTROSE 50 % IV SOLN
1.0000 | Freq: Once | INTRAVENOUS | Status: AC
  Administered 2017-08-25: 50 mL via INTRAVENOUS

## 2017-08-25 MED ORDER — INSULIN ASPART 100 UNIT/ML ~~LOC~~ SOLN: 0.0000 [IU] | SUBCUTANEOUS | Status: DC

## 2017-08-25 MED ORDER — NOREPINEPHRINE 16 MG/250ML-% IV SOLN
0.0000 ug/min | INTRAVENOUS | Status: DC
  Administered 2017-08-26: 2 ug/min via INTRAVENOUS
  Filled 2017-08-25: qty 250

## 2017-08-25 MED ORDER — SODIUM CHLORIDE 0.9% FLUSH
10.0000 mL | Freq: Two times a day (BID) | INTRAVENOUS | Status: DC
  Administered 2017-08-25 – 2017-08-27 (×5): 10 mL
  Administered 2017-08-27: 40 mL
  Administered 2017-08-28: 30 mL
  Administered 2017-08-28 – 2017-08-29 (×2): 40 mL
  Administered 2017-08-29 – 2017-08-30 (×2): 10 mL
  Administered 2017-08-30: 30 mL
  Administered 2017-08-31: 20 mL
  Administered 2017-08-31: 10 mL
  Administered 2017-09-01: 30 mL
  Administered 2017-09-01 – 2017-09-04 (×7): 10 mL
  Administered 2017-09-05: 30 mL
  Administered 2017-09-05: 10 mL
  Administered 2017-09-06: 30 mL
  Administered 2017-09-06: 10 mL
  Administered 2017-09-07: 30 mL
  Administered 2017-09-07 – 2017-09-08 (×2): 10 mL
  Administered 2017-09-08: 30 mL
  Administered 2017-09-09 – 2017-09-10 (×3): 10 mL
  Administered 2017-09-10: 20 mL
  Administered 2017-09-11: 10 mL
  Administered 2017-09-11: 30 mL
  Administered 2017-09-12 – 2017-09-13 (×3): 10 mL
  Administered 2017-09-13: 30 mL
  Administered 2017-09-14: 10:00:00 10 mL
  Administered 2017-09-14: 22:00:00 30 mL
  Administered 2017-09-15: 10:00:00 10 mL

## 2017-08-25 MED ORDER — HALOPERIDOL LACTATE 5 MG/ML IJ SOLN: 2.0000 mg | Freq: Four times a day (QID) | INTRAMUSCULAR | Status: DC | PRN

## 2017-08-25 MED ORDER — FAMOTIDINE IN NACL 20-0.9 MG/50ML-% IV SOLN: 20.0000 mg | INTRAVENOUS | Status: DC

## 2017-08-25 MED ORDER — DEXTROSE 50 % IV SOLN
25.0000 mL | Freq: Once | INTRAVENOUS | Status: AC
  Administered 2017-08-25: 25 mL via INTRAVENOUS

## 2017-08-25 MED ORDER — SODIUM CHLORIDE 0.9 % IV SOLN
1.0000 g | INTRAVENOUS | Status: DC
  Administered 2017-08-25 – 2017-08-31 (×7): 1 g via INTRAVENOUS
  Filled 2017-08-25: qty 1
  Filled 2017-08-25: qty 10
  Filled 2017-08-25: qty 1
  Filled 2017-08-25: qty 10
  Filled 2017-08-25 (×4): qty 1

## 2017-08-25 MED ORDER — SODIUM CHLORIDE 0.9% FLUSH: 10.0000 mL | INTRAVENOUS | Status: DC | PRN

## 2017-08-25 MED ORDER — DEXTROSE 50 % IV SOLN
INTRAVENOUS | Status: AC
  Administered 2017-08-25: 50 mL via INTRAVENOUS
  Filled 2017-08-25: qty 50

## 2017-08-25 MED ORDER — LACTATED RINGERS IV SOLN
INTRAVENOUS | Status: DC
  Administered 2017-08-25: 17:00:00 via INTRAVENOUS

## 2017-08-25 MED ORDER — DEXTROSE 50 % IV SOLN
INTRAVENOUS | Status: AC
  Administered 2017-08-25: 10:00:00
  Filled 2017-08-25: qty 50

## 2017-08-25 NOTE — Progress Notes (Signed)
Blood glucose 97

## 2017-08-25 NOTE — Progress Notes (Signed)
Blood glucose 64. Pt lethargic and unable to safely take po at this time. 30ml D50 given IV per protocol.

## 2017-08-25 NOTE — Progress Notes (Signed)
[facility] [place] NAME: Bonnie Holt    MR#:  [id number]  DATE OF BIRTH:  Jul 07, 1945  SUBJECTIVE:   More sleepy and lethargic today. Received some Haldol last REVIEW OF SYSTEMS:   Review of Systems  Constitutional: Negative for chills, fever and weight loss.  HENT: Negative for ear discharge, ear pain and nosebleeds.   Eyes: Negative for blurred vision, pain and discharge.  Respiratory: Positive for shortness of breath. Negative for sputum production, wheezing and stridor.   Cardiovascular: Positive for leg swelling and PND. Negative for chest pain, palpitations and orthopnea.  Gastrointestinal: Negative for abdominal pain, diarrhea, nausea and vomiting.  Genitourinary: Negative for frequency and urgency.  Musculoskeletal: Negative for back pain and joint pain.  Neurological: Negative for sensory change, speech change, focal weakness and weakness.  Psychiatric/Behavioral: Negative for depression and hallucinations. The patient is not nervous/anxious.    Tolerating Diet:yesTolerating PT: pending  DRUG ALLERGIES:   Allergies  Allergen Reactions  . Ace Inhibitors Cough    VITALS:  Blood pressure (!) 109/58, pulse (!) 110, temperature (!) 102 F (38.9 C), temperature source Axillary, resp. rate (!) 24, height 5\' 2"  (1.575 m), weight 102.4 kg (225 lb 12 oz), SpO2 97 %.  PHYSICAL EXAMINATION:   Physical Exam  GENERAL:  72 y.o.-year-old patient lying in the bed with no acute distress. Obese EYES: Pupils equal, round, reactive to light and accommodation. No scleral icterus. Extraocular muscles intact.  HEENT: Head atraumatic, normocephalic. Oropharynx and nasopharynx clear.  NECK:  Supple, no jugular venous distention. No thyroid enlargement, no tenderness.  LUNGS: Normal breath sounds bilaterally, no wheezing, rales, rhonchi. No use of accessory muscles of respiration.  CARDIOVASCULAR: S1, S2 normal. No murmurs, rubs, or  gallops.  ABDOMEN: Soft, nontender, nondistended. Bowel sounds present. No organomegaly or mass.  EXTREMITIES: No cyanosis, clubbing or edema b/l.    NEUROLOGIC: grossly nonfocal moves all extremities spontaneously. She is more lethargic today PSYCHIATRIC: more sleepy and lethargic today.Marland Kitchen  SKIN: No obvious rash, lesion, or ulcer.   LABORATORY PANEL:  CBC Recent Labs  Lab 08/25/17 0648  WBC 3.5*  HGB 10.2*  HCT 29.0*  PLT 40*    Chemistries  Recent Labs  Lab 08/22/17 2050  08/24/17 2032 08/25/17 0648  NA 131*   < > 129* 128*  K 3.5   < > 3.8 3.9  CL 104   < > 99* 102  CO2 14*   < > 17* 16*  GLUCOSE 231*   < > 116* 71  BUN 23*   < > 38* 47*  CREATININE 1.47*   < > 3.15* 4.03*  CALCIUM 7.7*   < > 7.5* 7.1*  MG 1.3*  --   --   --   AST  --    < > 240*  --   ALT  --    < > 104*  --   ALKPHOS  --    < > 62  --   BILITOT  --    < > 0.8  --    < > = values in this interval not displayed.   Cardiac Enzymes Recent Labs  Lab 08/23/17 1315  TROPONINI 2.02*   RADIOLOGY:  Ct Head Wo Contrast  Result Date: 08/24/2017 CLINICAL DATA:  Altered level of consciousness, type II diabetes mellitus, hypertension, coronary artery disease, LEFT breast cancer EXAM: CT HEAD WITHOUT CONTRAST TECHNIQUE: Contiguous axial images were obtained from the base of the  skull through the vertex without intravenous contrast. Sagittal and coronal MPR images reconstructed from axial data set. COMPARISON:  None FINDINGS: Brain: Normal ventricular morphology. No midline shift or mass effect. Normal appearance of brain parenchyma for age. No intracranial hemorrhage, mass lesion, or evidence of acute infarction. No extra-axial fluid collections. Vascular: Atherosclerotic calcification of internal carotid arteries at skull base. No hyperdense vessels. Skull: Intact Sinuses/Orbits: Clear Other: N/A IMPRESSION: No acute intracranial abnormalities. Electronically Signed   By: Lavonia Dana M.D.   On: 08/24/2017 20:35    Dg Chest Port 1 View  Result Date: 08/25/2017 CLINICAL DATA:  Patient admitted for sepsis of unknown cause 08/22/2017. Respiratory failure. EXAM: PORTABLE CHEST 1 VIEW COMPARISON:  Single view of the chest 08/22/2017. FINDINGS: Small pleural effusions, left greater than right, persist. There is left basilar airspace disease. Mild interstitial edema is seen. No pneumothorax. Heart size is upper normal. Aortic atherosclerosis. No acute bony abnormality. IMPRESSION: No notable change in small bilateral effusions, larger on the left. Left basilar airspace disease could be due to compressive atelectasis or pneumonia. Mild interstitial edema. Electronically Signed   By: Inge Rise M.D.   On: 08/25/2017 08:52   [place] Ekg Site Rite  Result Date: 08/25/2017 If Site Rite image not attached, placement could not be confirmed due to current cardiac rhythm.  ASSESSMENT AND PLAN:   Bonnie Holt is a 72 y.o. female has a past medical history significant for DM, HTN, and CAD now with 3 day hx of fever, chills, and back apin. Now confused and lethargic. In ER, pt febrile and tachycardic. Lactic acid elevated c/w with sepsis. UA abnormal and CXR negative  1. clinical sepsis secondary to likely UTI -patient currently on IV Zosyn-- change to IV cefazolin -IV fluids -urine culture positive for Klebsiella pneumonia and Proteus -blood culture is negative  2. acute hypoxic respiratory failure secondary to pulmonary edema in the setting of IV fluid volume overload -patient was transferred to ICU step down for BiPAP. Feels a lot better -wean oxygen as needed  3. Hypokalemia repleted  4. Elevated troponin could be demand ischemia with acute pulmonary edema in the setting of sepsis -patient denies any chest pain -cardiology consultation placed -continue aspirin, losartan, metoprolol  5. Type II diabetes continue home meds along with sliding scale  6. DVT prophylaxis heparin  Case discussed with Care  Management/Social Worker. Management plans discussed with the patient, family and they are in agreement.  CODE STATUS: full  DVT Prophylaxis: hepairn  TOTAL TIME TAKING CARE OF THIS PATIENT: 30 mins  >50% time spent on counselling and coordination of care  POSSIBLE D/C IN *1-2* DAYS, DEPENDING ON CLINICAL CONDITION.  Note: This dictation was prepared with Dragon dictation along with smaller phrase technology. Any transcriptional errors that result from this process are unintentional.  Fritzi Mandes M.D on 08/25/2017 at 3:23 PM  Between 7am to 6pm - Pager - [phone]  After 6pm go to [email or web address] - password [email or web address] [facility]  Office  [phone]  CC: Primary care physician; Kirk Ruths, MDPatient ID: Bonnie Holt, female   DOB: 07/15/1945, 72 y.o.   MRN: [id number]

## 2017-08-25 NOTE — Progress Notes (Signed)
Rough night last night as documented Earlier this morning, somnolent with facial flushing and mild respiratory distress Family informed me of recent tick bite This afternoon, patient appears less flushed and is more awake Cognition appears intact His pain and dyspnea  Vitals:   08/25/17 1200 08/25/17 1300 08/25/17 1400 08/25/17 1500  BP: (!) 115/44  (!) 109/58   Pulse: (!) 103 (!) 111 (!) 110 (!) 108  Resp: (!) 23 (!) 22 (!) 24 (!) 23  Temp: 99.4 F (37.4 C)  (!) 102 F (38.9 C) 99.4 F (37.4 C)  TempSrc: Oral  Axillary Axillary  SpO2: 96% 97% 97% 100%  Weight:      Height:       Gen: Minimally dyspneic at rest HEENT: NCAT, sclera white Neck: BP not visualized Lungs: Minimally labored, clear anteriorly Cardiovascular: Regular, no M Abdomen: Obese, soft, NT, NABS Ext: Trace symmetric pretibial edema Neuro: Cranial nerves intact, moves all extremities Skin: No lesions noted  BMP Latest Ref Rng & Units 08/25/2017 08/24/2017 08/23/2017  Glucose 65 - 99 mg/dL 71 116(H) 220(H)  BUN 6 - 20 mg/dL 47(H) 38(H) 25(H)  Creatinine 0.44 - 1.00 mg/dL 4.03(H) 3.15(H) 1.49(H)  Sodium 135 - 145 mmol/L 128(L) 129(L) 132(L)  Potassium 3.5 - 5.1 mmol/L 3.9 3.8 3.2(L)  Chloride 101 - 111 mmol/L 102 99(L) 103  CO2 22 - 32 mmol/L 16(L) 17(L) 19(L)  Calcium 8.9 - 10.3 mg/dL 7.1(L) 7.5(L) 7.5(L)   CBC Latest Ref Rng & Units 08/25/2017 08/24/2017 08/23/2017  WBC 3.6 - 11.0 K/uL 3.5(L) 2.7(L) 3.5(L)  Hemoglobin 12.0 - 16.0 g/dL 10.2(L) 10.6(L) 11.4(L)  Hematocrit 35.0 - 47.0 % 29.0(L) 30.4(L) 32.1(L)  Platelets 150 - 440 K/uL 40(L) 43(L) 55(L)   CXR: LLL airspace disease  IMP: Acute hypoxemic respiratory failure Resolving pulmonary edema Possible LLL pneumonia History of OSA History of CAD Mildly elevated troponin I -cardiology considers likely demand ischemia Chronic LBBB AKI, nonoliguric Klebsiella UTI Recent tick bite.  Concern for tickborne infections Type 2 diabetes Transient  hypoglycemia Thrombocytopenia History of breast cancer Acute encephalopathy  PLAN/REC: Continue supplemental oxygen Change bronchodilators to as needed only Continue nocturnal CPAP Continue hemodynamic monitoring Cardiology input appreciated Change cefazolin to ceftriaxone 6/12 Add doxycycline 6/12 RMSF and Lyme studies ordered Discontinue glipizide SSI scale adjusted DVT px: SCDs Mobilize out of bed as able Monitor CBC intermittently Transfuse per usual guidelines  Monitor in SDU through today  Merton Border, MD PCCM service Mobile [phone] Pager [phone] 08/25/2017 4:30 PM

## 2017-08-25 NOTE — Progress Notes (Signed)
Pharmacy Antibiotic Note  Bonnie Holt is a 72 y.o. female admitted on 08/22/2017 with sepsis.  Pharmacy has been consulted for Zosyn and vanco dosing.  6/11 Zosyn discontinued. Pharmacy consulted for Cefazolin dosing for UTI.   Plan: Continue Cefazolin 1g IV every 12 hours.   Height: 5\' 2"  (157.5 cm) Weight: 225 lb 12 oz (102.4 kg) IBW/kg (Calculated) : 50.1  Temp (24hrs), Avg:99.1 F (37.3 C), Min:97.8 F (36.6 C), Max:101 F (38.3 C)  Recent Labs  Lab 08/22/17 1252 08/22/17 1555 08/22/17 2050 08/23/17 0304 08/24/17 0318 08/24/17 2032 08/25/17 0648  WBC 4.1  --   --  3.5* 2.7*  --  3.5*  CREATININE 1.64*  --  1.47* 1.49*  --  3.15* 4.03*  LATICACIDVEN 4.9* 3.3*  --  1.8  --   --   --     Estimated Creatinine Clearance: 14.1 mL/min (A) (by C-G formula based on SCr of 4.03 mg/dL (H)).    Allergies  Allergen Reactions  . Ace Inhibitors Cough    Antimicrobials this admission: 6/9 Vanco >> 6/10 6/9 Zosyn >> 6/11 6/11 Cefazolin >>  Dose adjustments this admission:  Microbiology results: 6/9 BCx: sent 6/9 UCx: >100,000 Klebsiella pneumoniae                   60,000 Proteus mirabilis    Thank you for allowing pharmacy to be a part of this patient's care.  Pernell Dupre, PharmD, BCPS Clinical Pharmacist 08/25/2017 10:43 AM

## 2017-08-25 NOTE — Progress Notes (Signed)
*  PRELIMINARY RESULTS* Echocardiogram 2D Echocardiogram has been performed.  Sherrie Sport 08/25/2017, 1:55 PM

## 2017-08-25 NOTE — Progress Notes (Signed)
PHARMACY NOTE -  RENAL DOSE ADJUSTMENT   Pharmacy to assist with renal dose adjustment.   Patient has been initiated on famotidine 20mg  twice daily.  SCr 4.03, estimated CrCl 14.1 ml/min  Based on current CrCl <41ml/min current dose is not correct.  Will adjust dose to Famotidine 20mg  IV every 24 hours per protocol  Pernell Dupre, PharmD, BCPS Clinical Pharmacist 08/25/2017 10:46 AM

## 2017-08-26 ENCOUNTER — Inpatient Hospital Stay: Payer: PPO

## 2017-08-26 DIAGNOSIS — D696 Thrombocytopenia, unspecified: Secondary | ICD-10-CM

## 2017-08-26 DIAGNOSIS — W57XXXD Bitten or stung by nonvenomous insect and other nonvenomous arthropods, subsequent encounter: Secondary | ICD-10-CM

## 2017-08-26 DIAGNOSIS — G9340 Encephalopathy, unspecified: Secondary | ICD-10-CM

## 2017-08-26 LAB — COMPREHENSIVE METABOLIC PANEL
ALBUMIN: 2.3 g/dL — AB (ref 3.5–5.0)
ALK PHOS: 81 U/L (ref 38–126)
ALT: 64 U/L — AB (ref 14–54)
AST: 398 U/L — ABNORMAL HIGH (ref 15–41)
Anion gap: 10 (ref 5–15)
BILIRUBIN TOTAL: 0.9 mg/dL (ref 0.3–1.2)
BUN: 63 mg/dL — ABNORMAL HIGH (ref 6–20)
CALCIUM: 6.9 mg/dL — AB (ref 8.9–10.3)
CO2: 18 mmol/L — AB (ref 22–32)
CREATININE: 6.59 mg/dL — AB (ref 0.44–1.00)
Chloride: 101 mmol/L (ref 101–111)
GFR calc Af Amer: 7 mL/min — ABNORMAL LOW (ref >60)
GFR calc non Af Amer: 6 mL/min — ABNORMAL LOW (ref >60)
GLUCOSE: 140 mg/dL — AB (ref 65–99)
Potassium: 3.7 mmol/L (ref 3.5–5.1)
SODIUM: 129 mmol/L — AB (ref 135–145)
TOTAL PROTEIN: 5.3 g/dL — AB (ref 6.5–8.1)

## 2017-08-26 LAB — GLUCOSE, CAPILLARY
GLUCOSE-CAPILLARY: 149 mg/dL — AB (ref 65–99)
GLUCOSE-CAPILLARY: 152 mg/dL — AB (ref 65–99)
GLUCOSE-CAPILLARY: 170 mg/dL — AB (ref 65–99)
Glucose-Capillary: 141 mg/dL — ABNORMAL HIGH (ref 65–99)
Glucose-Capillary: 66 mg/dL (ref 65–99)
Glucose-Capillary: 122 mg/dL — ABNORMAL HIGH (ref 65–99)
Glucose-Capillary: 119 mg/dL — ABNORMAL HIGH (ref 65–99)

## 2017-08-26 LAB — CBC
HEMATOCRIT: 27.1 % — AB (ref 35.0–47.0)
HEMOGLOBIN: 9.4 g/dL — AB (ref 12.0–16.0)
MCH: 31.8 pg (ref 26.0–34.0)
MCHC: 34.7 g/dL (ref 32.0–36.0)
MCV: 91.6 fL (ref 80.0–100.0)
Platelets: 44 10*3/uL — ABNORMAL LOW (ref 150–440)
RBC: 2.96 MIL/uL — AB (ref 3.80–5.20)
RDW: 15.3 % — ABNORMAL HIGH (ref 11.5–14.5)
WBC: 3.9 10*3/uL (ref 3.6–11.0)

## 2017-08-26 LAB — BASIC METABOLIC PANEL
ANION GAP: 10 (ref 5–15)
ANION GAP: 16 — AB (ref 5–15)
BUN: 62 mg/dL — ABNORMAL HIGH (ref 6–20)
BUN: 67 mg/dL — ABNORMAL HIGH (ref 6–20)
CHLORIDE: 101 mmol/L (ref 101–111)
CHLORIDE: 98 mmol/L — AB (ref 101–111)
CO2: 16 mmol/L — ABNORMAL LOW (ref 22–32)
CO2: 15 mmol/L — ABNORMAL LOW (ref 22–32)
CREATININE: 7.59 mg/dL — AB (ref 0.44–1.00)
Calcium: 6.6 mg/dL — ABNORMAL LOW (ref 8.9–10.3)
Calcium: 6.9 mg/dL — ABNORMAL LOW (ref 8.9–10.3)
Creatinine, Ser: 7.26 mg/dL — ABNORMAL HIGH (ref 0.44–1.00)
GFR calc non Af Amer: 5 mL/min — ABNORMAL LOW (ref >60)
GFR calc non Af Amer: 5 mL/min — ABNORMAL LOW (ref >60)
GFR, EST AFRICAN AMERICAN: 6 mL/min — AB (ref >60)
GFR, EST AFRICAN AMERICAN: 5 mL/min — AB (ref >60)
Glucose, Bld: 125 mg/dL — ABNORMAL HIGH (ref 65–99)
Glucose, Bld: 153 mg/dL — ABNORMAL HIGH (ref 65–99)
POTASSIUM: 3.6 mmol/L (ref 3.5–5.1)
POTASSIUM: 3.7 mmol/L (ref 3.5–5.1)
SODIUM: 127 mmol/L — AB (ref 135–145)
Sodium: 129 mmol/L — ABNORMAL LOW (ref 135–145)

## 2017-08-26 LAB — B. BURGDORFI ANTIBODIES: B burgdorferi Ab IgG+IgM: <0.91 {ISR} (ref 0.00–0.90)

## 2017-08-26 MED ORDER — DEXTROSE 50 % IV SOLN
1.0000 | Freq: Once | INTRAVENOUS | Status: AC
  Administered 2017-08-26: 50 mL via INTRAVENOUS

## 2017-08-26 MED ORDER — HYDROCODONE-ACETAMINOPHEN 5-325 MG PO TABS
1.0000 | ORAL_TABLET | Freq: Four times a day (QID) | ORAL | Status: DC | PRN
  Administered 2017-08-26 – 2017-08-27 (×2): 1 via ORAL
  Filled 2017-08-26 (×2): qty 1

## 2017-08-26 MED ORDER — DEXTROSE IN LACTATED RINGERS 5 % IV SOLN
INTRAVENOUS | Status: DC
  Administered 2017-08-26 – 2017-08-27 (×3): via INTRAVENOUS

## 2017-08-26 MED ORDER — DEXTROSE 50 % IV SOLN
INTRAVENOUS | Status: AC
  Administered 2017-08-26: 04:00:00
  Filled 2017-08-26: qty 50

## 2017-08-26 MED ORDER — ALBUMIN HUMAN 25 % IV SOLN
12.5000 g | Freq: Three times a day (TID) | INTRAVENOUS | Status: AC
  Administered 2017-08-26 – 2017-08-28 (×6): 12.5 g via INTRAVENOUS
  Filled 2017-08-26 (×10): qty 50

## 2017-08-26 NOTE — Progress Notes (Signed)
FSBS - 66. 1 amp D50 administered per protocol.  2nd amp D50 during this shift.  Hinton Dyer, NP informed.  IVF changed per order.  Will continue to monitor.

## 2017-08-26 NOTE — Progress Notes (Signed)
[facility] [place] NAME: Bonnie Holt    MR#:  [id number]  DATE OF BIRTH:  1945-09-29  SUBJECTIVE:   More awake  Husband in the room REVIEW OF SYSTEMS:   Review of Systems  Constitutional: Negative for chills, fever and weight loss.  HENT: Negative for ear discharge, ear pain and nosebleeds.   Eyes: Negative for blurred vision, pain and discharge.  Respiratory: Positive for shortness of breath. Negative for sputum production, wheezing and stridor.   Cardiovascular: Positive for leg swelling and PND. Negative for chest pain, palpitations and orthopnea.  Gastrointestinal: Negative for abdominal pain, diarrhea, nausea and vomiting.  Genitourinary: Negative for frequency and urgency.  Musculoskeletal: Negative for back pain and joint pain.  Neurological: Negative for sensory change, speech change, focal weakness and weakness.  Psychiatric/Behavioral: Negative for depression and hallucinations. The patient is not nervous/anxious.    Tolerating Diet:yesTolerating PT: pending  DRUG ALLERGIES:   Allergies  Allergen Reactions  . Ace Inhibitors Cough    VITALS:  Blood pressure (!) 111/97, pulse (!) 241, temperature 98.7 F (37.1 C), temperature source Axillary, resp. rate 20, height 5\' 2"  (1.575 m), weight 107.2 kg (236 lb 5.3 oz), SpO2 100 %.  PHYSICAL EXAMINATION:   Physical Exam  GENERAL:  72 y.o.-year-old patient lying in the bed with no acute distress. Obese EYES: Pupils equal, round, reactive to light and accommodation. No scleral icterus. Extraocular muscles intact.  HEENT: Head atraumatic, normocephalic. Oropharynx and nasopharynx clear.  NECK:  Supple, no jugular venous distention. No thyroid enlargement, no tenderness.  LUNGS: Normal breath sounds bilaterally, no wheezing, rales, rhonchi. No use of accessory muscles of respiration.  CARDIOVASCULAR: S1, S2 normal. No murmurs, rubs, or gallops.  ABDOMEN: Soft, nontender,  nondistended. Bowel sounds present. No organomegaly or mass.  EXTREMITIES: No cyanosis, clubbing or edema b/l.    NEUROLOGIC: grossly nonfocal moves all extremities spontaneously. She is more lethargic today PSYCHIATRIC: more sleepy and lethargic today.Marland Kitchen  SKIN: No obvious rash, lesion, or ulcer.   LABORATORY PANEL:  CBC Recent Labs  Lab 08/26/17 0501  WBC 3.9  HGB 9.4*  HCT 27.1*  PLT 44*    Chemistries  Recent Labs  Lab 08/22/17 2050  08/26/17 0501 08/26/17 1145  NA 131*   < > 129* 127*  K 3.5   < > 3.7 3.6  CL 104   < > 101 101  CO2 14*   < > 18* 16*  GLUCOSE 231*   < > 140* 125*  BUN 23*   < > 63* 62*  CREATININE 1.47*   < > 6.59* 7.26*  CALCIUM 7.7*   < > 6.9* 6.6*  MG 1.3*  --   --   --   AST  --    < > 398*  --   ALT  --    < > 64*  --   ALKPHOS  --    < > 81  --   BILITOT  --    < > 0.9  --    < > = values in this interval not displayed.   Cardiac Enzymes Recent Labs  Lab 08/23/17 1315  TROPONINI 2.02*   RADIOLOGY:  Ct Head Wo Contrast  Result Date: 08/24/2017 CLINICAL DATA:  Altered level of consciousness, type II diabetes mellitus, hypertension, coronary artery disease, LEFT breast cancer EXAM: CT HEAD WITHOUT CONTRAST TECHNIQUE: Contiguous axial images were obtained from the base of the skull through the vertex  without intravenous contrast. Sagittal and coronal MPR images reconstructed from axial data set. COMPARISON:  None FINDINGS: Brain: Normal ventricular morphology. No midline shift or mass effect. Normal appearance of brain parenchyma for age. No intracranial hemorrhage, mass lesion, or evidence of acute infarction. No extra-axial fluid collections. Vascular: Atherosclerotic calcification of internal carotid arteries at skull base. No hyperdense vessels. Skull: Intact Sinuses/Orbits: Clear Other: N/A IMPRESSION: No acute intracranial abnormalities. Electronically Signed   By: Lavonia Dana M.D.   On: 08/24/2017 20:35   Dg Chest Port 1 View  Result  Date: 08/26/2017 CLINICAL DATA:  Respiratory failure. EXAM: PORTABLE CHEST 1 VIEW COMPARISON:  08/25/2017.  08/22/2017. FINDINGS: Right PICC line noted with tip noted over the superior vena cava. Stable cardiomegaly. Persistent left base infiltrate and left-sided pleural effusion noted. Questionable nodular density noted over the left apex. A PA and lateral chest x-ray suggested for further evaluation when the patient is clinically capable. No pneumothorax. Degenerative changes thoracic spine. IMPRESSION: 1.  Right PICC line noted with tip over superior vena cava. 2. Persistent left base infiltrate and left-sided pleural effusion. No interim change. 3. Questionable nodular density over the left pulmonary apex. A PA and lateral chest x-ray is suggested for further evaluation when the patient is clinically capable. 4.  Stable cardiomegaly. Electronically Signed   By: Marcello Moores  Register   On: 08/26/2017 05:57   Dg Chest Port 1 View  Result Date: 08/25/2017 CLINICAL DATA:  Patient admitted for sepsis of unknown cause 08/22/2017. Respiratory failure. EXAM: PORTABLE CHEST 1 VIEW COMPARISON:  Single view of the chest 08/22/2017. FINDINGS: Small pleural effusions, left greater than right, persist. There is left basilar airspace disease. Mild interstitial edema is seen. No pneumothorax. Heart size is upper normal. Aortic atherosclerosis. No acute bony abnormality. IMPRESSION: No notable change in small bilateral effusions, larger on the left. Left basilar airspace disease could be due to compressive atelectasis or pneumonia. Mild interstitial edema. Electronically Signed   By: Inge Rise M.D.   On: 08/25/2017 08:52   [place] Ekg Site Rite  Result Date: 08/25/2017 If Site Rite image not attached, placement could not be confirmed due to current cardiac rhythm.  ASSESSMENT AND PLAN:   Bonnie Holt is a 72 y.o. female has a past medical history significant for DM, HTN, and CAD now with 3 day hx of fever, chills, and  back apin. Now confused and lethargic. In ER, pt febrile and tachycardic. Lactic acid elevated c/w with sepsis. UA abnormal and CXR negative  1. clinical sepsis secondary to likely UTI -patient currently on IV Zosyn-- change to IV cefazolin -IV fluids -urine culture positive for Klebsiella pneumonia and Proteus -blood culture is negative  2. acute hypoxic respiratory failure secondary to pulmonary edema in the setting of IV fluid volume overload -patient was transferred to ICU step down for BiPAP. Feels a lot better -wean oxygen as needed  3 Acute nonoliguric renal failure in the setting of ATN with sepsis -creat 1.49--3.15--4.03--6.5--72.6 -uop decreasing -Nephrology consultation placed.  4. Elevated troponin could be demand ischemia with acute pulmonary edema in the setting of sepsis -patient denies any chest pain -cardiology consultation placed with dr [name] -continue aspirin, losartan, metoprolol -echo shows EF 50-55%  5. Type II diabetes continue home meds along with sliding scale  6. DVT prophylaxis heparin  Case discussed with Care Management/Social Worker. Management plans discussed with the patient, family and they are in agreement.  CODE STATUS: full  DVT Prophylaxis: hepairn  TOTAL TIME TAKING  CARE OF THIS PATIENT: 30 mins  >50% time spent on counselling and coordination of care  POSSIBLE D/C IN *1-2* DAYS, DEPENDING ON CLINICAL CONDITION.  Note: This dictation was prepared with Dragon dictation along with smaller phrase technology. Any transcriptional errors that result from this process are unintentional.  Fritzi Mandes M.D on 08/26/2017 at 3:42 PM  Between 7am to 6pm - Pager - [phone]  After 6pm go to [email or web address] - password [email or web address] [facility]  Office  [phone]  CC: Primary care physician; Kirk Ruths, MDPatient ID: Bonnie Holt, female   DOB: 1945/12/16, 72 y.o.   MRN: [id number]
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
## 2017-08-26 NOTE — Progress Notes (Signed)
No problems overnight.  Cognition much improved.  Appears more comfortable.  More awake and alert.  Conversant and well oriented.  No distress  Vitals:   08/26/17 1100 08/26/17 1130 08/26/17 1131 08/26/17 1200  BP: (!) 100/54 (!) 85/50 (!) 82/55 (!) 102/56  Pulse: (!) 102 (!) 102 (!) 106 (!) 103  Resp: (!) 22 20  19   Temp:      TempSrc:      SpO2: 99% 100%  100%  Weight:      Height:       Gen: NAD HEENT: NCAT, sclerae white Neck: JVP not visualized Lungs: Effort normal, no wheezes, left basilar crackles and bronchial breath sounds Cardiovascular: Regular, no M Abdomen: Soft, NT, NABS Ext: Symmetric pretibial and ankle edema Neuro: No focal deficits Skin: Facial flushing has resolved  BMP Latest Ref Rng & Units 08/26/2017 08/26/2017 08/25/2017  Glucose 65 - 99 mg/dL 125(H) 140(H) 71  BUN 6 - 20 mg/dL 62(H) 63(H) 47(H)  Creatinine 0.44 - 1.00 mg/dL 7.26(H) 6.59(H) 4.03(H)  Sodium 135 - 145 mmol/L 127(L) 129(L) 128(L)  Potassium 3.5 - 5.1 mmol/L 3.6 3.7 3.9  Chloride 101 - 111 mmol/L 101 101 102  CO2 22 - 32 mmol/L 16(L) 18(L) 16(L)  Calcium 8.9 - 10.3 mg/dL 6.6(L) 6.9(L) 7.1(L)   CBC Latest Ref Rng & Units 08/26/2017 08/25/2017 08/24/2017  WBC 3.6 - 11.0 K/uL 3.9 3.5(L) 2.7(L)  Hemoglobin 12.0 - 16.0 g/dL 9.4(L) 10.2(L) 10.6(L)  Hematocrit 35.0 - 47.0 % 27.1(L) 29.0(L) 30.4(L)  Platelets 150 - 440 K/uL 44(L) 40(L) 43(L)   CXR: Increased LLL airspace disease with probable small left effusion  IMP: Acute hypoxemic respiratory failure Resolving pulmonary edema Probable LLL pneumonia History of OSA History of CAD Mildly elevated troponin I - likely demand ischemia Chronic LBBB AKI, nonoliguric with worsening creatinine Klebsiella UTI, likely fully treated Recent tick bite. Concern for tickborne infections Type 2 diabetes Transient hypoglycemia Thrombocytopenia History of breast cancer Acute encephalopathy, improving to resolved  PLAN/REC: Continue supplemental oxygen  to maintain SPO2 >90% Continue nebulized bronchodilators as needed Continue nocturnal CPAP for OSA Cardiology following.  Further evaluation for CAD on hold until current illness resolved Monitor BMET intermittently Monitor I/Os Correct electrolytes as indicated  Nephrology consultation 6/13 Continue to monitor and SDU Continue ceftriaxone and doxycycline (initiated 6/12)  RMSF and Lyme serology results pending SSI discontinued 6/12 Continue monitoring CBGs Advance diet as able DVT px: SCDs Monitor CBC intermittently Transfuse per usual guidelines  Advance activity as able    Merton Border, MD PCCM service Mobile [phone] Pager [phone] 08/26/2017 12:23 PM

## 2017-08-26 NOTE — Progress Notes (Signed)
FSBS 61.  1 amp D50 administered per protocol.  Hinton Dyer, NP informed.

## 2017-08-27 LAB — BASIC METABOLIC PANEL
Anion gap: 13 (ref 5–15)
Anion gap: 14 (ref 5–15)
BUN: 74 mg/dL — AB (ref 6–20)
BUN: 77 mg/dL — AB (ref 6–20)
CHLORIDE: 99 mmol/L — AB (ref 101–111)
CO2: 17 mmol/L — ABNORMAL LOW (ref 22–32)
CO2: 15 mmol/L — ABNORMAL LOW (ref 22–32)
CREATININE: 8.93 mg/dL — AB (ref 0.44–1.00)
Calcium: 7.1 mg/dL — ABNORMAL LOW (ref 8.9–10.3)
Calcium: 7 mg/dL — ABNORMAL LOW (ref 8.9–10.3)
Chloride: 99 mmol/L — ABNORMAL LOW (ref 101–111)
Creatinine, Ser: 8.42 mg/dL — ABNORMAL HIGH (ref 0.44–1.00)
GFR calc Af Amer: 5 mL/min — ABNORMAL LOW (ref >60)
GFR calc non Af Amer: 4 mL/min — ABNORMAL LOW (ref >60)
GFR, EST AFRICAN AMERICAN: 5 mL/min — AB (ref >60)
GFR, EST NON AFRICAN AMERICAN: 4 mL/min — AB (ref >60)
GLUCOSE: 173 mg/dL — AB (ref 65–99)
Glucose, Bld: 139 mg/dL — ABNORMAL HIGH (ref 65–99)
POTASSIUM: 3.4 mmol/L — AB (ref 3.5–5.1)
POTASSIUM: 3.7 mmol/L (ref 3.5–5.1)
SODIUM: 129 mmol/L — AB (ref 135–145)
SODIUM: 128 mmol/L — AB (ref 135–145)

## 2017-08-27 LAB — CBC
HEMATOCRIT: 28.7 % — AB (ref 35.0–47.0)
Hemoglobin: 9.9 g/dL — ABNORMAL LOW (ref 12.0–16.0)
MCH: 31.7 pg (ref 26.0–34.0)
MCHC: 34.5 g/dL (ref 32.0–36.0)
MCV: 91.9 fL (ref 80.0–100.0)
Platelets: 51 10*3/uL — ABNORMAL LOW (ref 150–440)
RBC: 3.12 MIL/uL — AB (ref 3.80–5.20)
RDW: 15.8 % — ABNORMAL HIGH (ref 11.5–14.5)
WBC: 4.5 10*3/uL (ref 3.6–11.0)

## 2017-08-27 LAB — CULTURE, BLOOD (ROUTINE X 2)
CULTURE: NO GROWTH
Culture: NO GROWTH
SPECIAL REQUESTS: ADEQUATE

## 2017-08-27 LAB — GLUCOSE, CAPILLARY
GLUCOSE-CAPILLARY: 126 mg/dL — AB (ref 65–99)
GLUCOSE-CAPILLARY: 145 mg/dL — AB (ref 65–99)
GLUCOSE-CAPILLARY: 224 mg/dL — AB (ref 65–99)
Glucose-Capillary: 137 mg/dL — ABNORMAL HIGH (ref 65–99)
Glucose-Capillary: 127 mg/dL — ABNORMAL HIGH (ref 65–99)
Glucose-Capillary: 131 mg/dL — ABNORMAL HIGH (ref 65–99)

## 2017-08-27 LAB — ROCKY MTN SPOTTED FVR ABS PNL(IGG+IGM)
RMSF IGG: NEGATIVE
RMSF IgM: 0.28 index (ref 0.00–0.89)

## 2017-08-27 MED ORDER — NEPRO/CARBSTEADY PO LIQD
237.0000 mL | Freq: Two times a day (BID) | ORAL | Status: DC
  Administered 2017-08-27 – 2017-08-28 (×2): 237 mL via ORAL
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
## 2017-08-27 NOTE — Progress Notes (Addendum)
No problems overnight.  Cognition much improved.  Appears more comfortable.  More awake and alert.  Conversant and well oriented.  No distress  Vitals:   08/26/17 2328 08/27/17 0000 08/27/17 0100 08/27/17 0200  BP:  (!) 98/55 (!) 87/72 (!) 106/53  Pulse: 84 78 81 80  Resp: (!) 21 14 18 16   Temp:      TempSrc:      SpO2: 100% 100% 100% 94%  Weight:      Height:       Gen: NAD HEENT: NCAT, sclerae white Neck: JVP not visualized Lungs: Effort normal, no wheezes, left basilar crackles and bronchial breath sounds Cardiovascular: Regular, no M Abdomen: Soft, NT, NABS Ext: Symmetric pretibial and ankle edema Neuro: No focal deficits Skin: Facial flushing has resolved  BMP Latest Ref Rng & Units 08/27/2017 08/26/2017 08/26/2017  Glucose 65 - 99 mg/dL 139(H) 153(H) 125(H)  BUN 6 - 20 mg/dL 74(H) 67(H) 62(H)  Creatinine 0.44 - 1.00 mg/dL 8.42(H) 7.59(H) 7.26(H)  Sodium 135 - 145 mmol/L 129(L) 129(L) 127(L)  Potassium 3.5 - 5.1 mmol/L 3.4(L) 3.7 3.6  Chloride 101 - 111 mmol/L 99(L) 98(L) 101  CO2 22 - 32 mmol/L 17(L) 15(L) 16(L)  Calcium 8.9 - 10.3 mg/dL 7.1(L) 6.9(L) 6.6(L)   CBC Latest Ref Rng & Units 08/27/2017 08/26/2017 08/25/2017  WBC 3.6 - 11.0 K/uL 4.5 3.9 3.5(L)  Hemoglobin 12.0 - 16.0 g/dL 9.9(L) 9.4(L) 10.2(L)  Hematocrit 35.0 - 47.0 % 28.7(L) 27.1(L) 29.0(L)  Platelets 150 - 440 K/uL 51(L) 44(L) 40(L)   CXR: Increased LLL airspace disease with probable small left effusion  IMP: Acute hypoxemic respiratory failure. Resolving pulmonary edema with possible LLL pneumonia  Mildly elevated troponin I - likely demand ischemia  AKI, nonoliguric with worsening creatinine. K is 3.4, CO2 is 17. Cr 8.4. Pending nephrology.   Recent tick bite. Concern for tickborne infections. Started empirically on doxycycline and Rocephin, will consult infectious disease  Type 2 diabetes. On scale coverage  Thrombocytopenia  History of breast cancer  Acute encephalopathy, improving    Critical Care Time [place], DO  08/27/2017 8:27 AM  Patient ID: Bonnie Holt, female   DOB: [birth year], 72 y.o.   MRN: [id number]

## 2017-08-27 NOTE — Progress Notes (Signed)
[facility] [place] NAME: Bonnie Holt    MR#:  [id number]  DATE OF BIRTH:  [birth year]  SUBJECTIVE:   Patient's mental status has been waxing and waning as per the husband at bedside.  Patient received some Vicodin yesterday.  Remains anuric, creatinine has not improved.  Off vasopressors since yesterday.  REVIEW OF SYSTEMS:   Review of Systems  Constitutional: Negative for chills, fever and weight loss.  HENT: Negative for ear discharge, ear pain and nosebleeds.   Eyes: Negative for blurred vision, pain and discharge.  Respiratory: Positive for shortness of breath. Negative for sputum production, wheezing and stridor.   Cardiovascular: Positive for leg swelling and PND. Negative for chest pain, palpitations and orthopnea.  Gastrointestinal: Negative for abdominal pain, diarrhea, nausea and vomiting.  Genitourinary: Negative for frequency and urgency.  Musculoskeletal: Negative for back pain and joint pain.  Neurological: Negative for sensory change, speech change, focal weakness and weakness ().  Psychiatric/Behavioral: Negative for depression and hallucinations. The patient is not nervous/anxious.    Tolerating Diet:yes Tolerating PT: pending  DRUG ALLERGIES:   Allergies  Allergen Reactions  . Ace Inhibitors Cough    VITALS:  Blood pressure (!) 130/55, pulse 79, temperature 98.1 F (36.7 C), temperature source Axillary, resp. rate 13, height 5\' 2"  (1.575 m), weight 107.2 kg (236 lb 5.3 oz), SpO2 93 %.  PHYSICAL EXAMINATION:   Physical Exam  GENERAL:  72 y.o.-year-old obese patient lying in the bed in no acute distress.  EYES: Pupils equal, round, reactive to light and accommodation. No scleral icterus. Extraocular muscles intact.  HEENT: Head atraumatic, normocephalic. Oropharynx and nasopharynx clear.  NECK:  Supple, no jugular venous distention. No thyroid enlargement, no tenderness.  LUNGS: Poor Resp. effort, no  wheezing, rales, rhonchi. No use of accessory muscles of respiration.  CARDIOVASCULAR: S1, S2 normal. No murmurs, rubs, or gallops.  ABDOMEN: Soft, nontender, nondistended. Bowel sounds present. No organomegaly or mass.  EXTREMITIES: No cyanosis, clubbing or edema b/l.    NEUROLOGIC: grossly nonfocal moves all extremities spontaneously. Globally weak.  PSYCHIATRIC: AAO X 2.   SKIN: No obvious rash, lesion, or ulcer.   LABORATORY PANEL:  CBC Recent Labs  Lab 08/27/17 0441  WBC 4.5  HGB 9.9*  HCT 28.7*  PLT 51*    Chemistries  Recent Labs  Lab 08/22/17 2050  08/26/17 0501  08/27/17 0441  NA 131*   < > 129*   < > 129*  K 3.5   < > 3.7   < > 3.4*  CL 104   < > 101   < > 99*  CO2 14*   < > 18*   < > 17*  GLUCOSE 231*   < > 140*   < > 139*  BUN 23*   < > 63*   < > 74*  CREATININE 1.47*   < > 6.59*   < > 8.42*  CALCIUM 7.7*   < > 6.9*   < > 7.1*  MG 1.3*  --   --   --   --   AST  --    < > 398*  --   --   ALT  --    < > 64*  --   --   ALKPHOS  --    < > 81  --   --   BILITOT  --    < > 0.9  --   --    < > =  values in this interval not displayed.   Cardiac Enzymes Recent Labs  Lab 08/23/17 1315  TROPONINI 2.02*   RADIOLOGY:  Dg Chest Port 1 View  Result Date: 08/26/2017 CLINICAL DATA:  Respiratory failure. EXAM: PORTABLE CHEST 1 VIEW COMPARISON:  08/25/2017.  08/22/2017. FINDINGS: Right PICC line noted with tip noted over the superior vena cava. Stable cardiomegaly. Persistent left base infiltrate and left-sided pleural effusion noted. Questionable nodular density noted over the left apex. A PA and lateral chest x-ray suggested for further evaluation when the patient is clinically capable. No pneumothorax. Degenerative changes thoracic spine. IMPRESSION: 1.  Right PICC line noted with tip over superior vena cava. 2. Persistent left base infiltrate and left-sided pleural effusion. No interim change. 3. Questionable nodular density over the left pulmonary apex. A PA and lateral  chest x-ray is suggested for further evaluation when the patient is clinically capable. 4.  Stable cardiomegaly. Electronically Signed   By: Marcello Moores  Register   On: 08/26/2017 05:57   ASSESSMENT AND PLAN:   Bonnie Holt is a 72 y.o. female has a past medical history significant for DM, HTN, and CAD now with 3 day hx of fever, chills, and back apin. Now confused and lethargic. In ER, pt febrile and tachycardic. Lactic acid elevated c/w with sepsis. UA abnormal and CXR negative  1. clinical sepsis secondary to likely UTI - Urine cultures are + for  Klebsiella pneumonia and Proteus -blood culture is negative Continue IV ceftriaxone, doxycycline.  Patient is empirically being covered for [facility] spotted fever as a tic was noted on her.  Patient has no rash or LFT abnormalities. -Patient is off IV vasopressors now.  2. acute hypoxic respiratory failure secondary to pulmonary edema in the setting of IV fluid volume overload -patient was transferred to ICU step down for BiPAP. Feels a lot better -Off BiPAP and currently on nasal cannula doing well.  3 Acute Renal failure - likely due to ATN from sepsis.  - pt. Is anuric.  Cr. Up to 8.5 today.  -By nephrology and they would like to wait another 24 hours to see if the patient's urine output improves.  If not they may consider starting the patient on hemodialysis.  Her electrolytes are stable.  4. Elevated troponin - demand ischemia in the setting of sepsis -patient denies any chest pain -cardiology consultation placed with dr [name] and no plan for any acute intervention.  - Meds on hold due to sepsis. Hypotension.  -echo shows EF 50-55% with no wall motional abnormalities.   5. Hx of Breast Cancer - cont. Femara   Case discussed with Care Management/Social Worker. Management plans discussed with the patient, family and they are in agreement.  CODE STATUS: full  DVT Prophylaxis: hepairn  TOTAL TIME TAKING CARE OF THIS PATIENT: 30 mins    POSSIBLE D/C IN 1-2 DAYS, DEPENDING ON CLINICAL CONDITION.  Note: This dictation was prepared with Dragon dictation along with smaller phrase technology. Any transcriptional errors that result from this process are unintentional.  [name] M.D on 08/27/2017 at 3:36 PM  Between 7am to 6pm - Pager - [phone]  After 6pm go to [email or web address] - password [email or web address] [facility]  Office  [phone]  CC: Primary care physician; Kirk Ruths, MDPatient ID: [name], female   DOB: Mar 22, 1945, 72 y.o.   MRN: [id number]

## 2017-08-27 NOTE — Progress Notes (Signed)
[facility], [place] 08/27/17  Subjective:   Mental status US waxing and waning. She received one dose of vicodin last evening. Has been sleepy since. Uses CPAP when asleep. Appetite is poor. + edema Potassium is low  Objective:  Vital signs in last 24 hours:  Temp:  [97.7 F (36.5 C)-98.7 F (37.1 C)] 97.7 F (36.5 C) (06/14 0800) Pulse Rate:  [34-241] 85 (06/14 0930) Resp:  [13-23] 20 (06/14 0930) BP: (82-128)/(42-97) 123/62 (06/14 0930) SpO2:  [94 %-100 %] 95 % (06/14 0930)  Weight change:  Filed Weights   08/23/17 0516 08/24/17 0500 08/26/17 0443  Weight: 103.3 kg (227 lb 11.8 oz) 102.4 kg (225 lb 12 oz) 107.2 kg (236 lb 5.3 oz)    Intake/Output:    Intake/Output Summary (Last 24 hours) at 08/27/2017 0958 Last data filed at 08/27/2017 0900 Gross per 24 hour  Intake 2733.86 ml  Output 25 ml  Net 2708.86 ml   Physical Exam: General:  Critically ill-appearing, laying in the bed  HEENT  anicteric, dry mouth  Neck:  Supple  Lungs:  Normal breathing effort, clear anteriorly and laterally,   Heart::  Tachycardic, irregular A Fib  Abdomen:  Soft, nontender  Extremities:  ++ edema  Neurologic:  Sleepy but arousable, answers few questions  Skin:  No acute rashes    Basic Metabolic Panel:  Recent Labs  Lab 08/22/17 2050  08/25/17 0648 08/26/17 0501 08/26/17 1145 08/26/17 1709 08/27/17 0441  NA 131*   < > 128* 129* 127* 129* 129*  K 3.5   < > 3.9 3.7 3.6 3.7 3.4*  CL 104   < > 102 101 101 98* 99*  CO2 14*   < > 16* 18* 16* 15* 17*  GLUCOSE 231*   < > 71 140* 125* 153* 139*  BUN 23*   < > 47* 63* 62* 67* 74*  CREATININE 1.47*   < > 4.03* 6.59* 7.26* 7.59* 8.42*  CALCIUM 7.7*   < > 7.1* 6.9* 6.6* 6.9* 7.1*  MG 1.3*  --   --   --   --   --   --   PHOS 2.7  --   --   --   --   --   --    < > = values in this interval not displayed.     CBC: Recent Labs  Lab 08/22/17 1252 08/23/17 0304 08/24/17 0318 08/25/17 0648 08/26/17 0501  08/27/17 0441  WBC 4.1 3.5* 2.7* 3.5* 3.9 4.5  NEUTROABS 3.7  --   --   --   --   --   HGB 12.7 11.4* 10.6* 10.2* 9.4* 9.9*  HCT 36.8 32.1* 30.4* 29.0* 27.1* 28.7*  MCV 91.7 90.4 90.9 91.8 91.6 91.9  PLT 92* 55* 43* 40* 44* 51*     No results found for: HEPBSAG, HEPBSAB, HEPBIGM    Microbiology:  Recent Results (from the past 240 hour(s))  Blood Culture (routine x 2)     Status: None   Collection Time: 08/22/17 12:52 PM  Result Value Ref Range Status   Specimen Description BLOOD RIGHT HAND  Final   Special Requests   Final    BOTTLES DRAWN AEROBIC AND ANAEROBIC Blood Culture adequate volume   Culture   Final    NO GROWTH 5 DAYS Performed at [facility], [address] [place], [place] [address]    Report Status 08/27/2017 FINAL  Final  Urine culture     Status:  Abnormal   Collection Time: 08/22/17 12:52 PM  Result Value Ref Range Status   Specimen Description   Final    URINE, RANDOM Performed at [facility], [address] [place], [place] [address]    Special Requests   Final    NONE Performed at [facility], [address] [place], [place] [address]    Culture (A)  Final    >=100,000 COLONIES/mL KLEBSIELLA PNEUMONIAE 60,000 COLONIES/mL PROTEUS MIRABILIS    Report Status 08/24/2017 FINAL  Final   Organism ID, Bacteria KLEBSIELLA PNEUMONIAE (A)  Final   Organism ID, Bacteria PROTEUS MIRABILIS (A)  Final      Susceptibility   Klebsiella pneumoniae - MIC*    AMPICILLIN RESISTANT Resistant     CEFAZOLIN <=4 SENSITIVE Sensitive     CEFTRIAXONE <=1 SENSITIVE Sensitive     CIPROFLOXACIN <=0.25 SENSITIVE Sensitive     GENTAMICIN <=1 SENSITIVE Sensitive     IMIPENEM <=0.25 SENSITIVE Sensitive     NITROFURANTOIN 64 INTERMEDIATE Intermediate     TRIMETH/SULFA <=20 SENSITIVE Sensitive     AMPICILLIN/SULBACTAM 4 SENSITIVE Sensitive     PIP/TAZO <=4 SENSITIVE Sensitive     Extended ESBL NEGATIVE Sensitive     * >=100,000 COLONIES/mL  KLEBSIELLA PNEUMONIAE   Proteus mirabilis - MIC*    AMPICILLIN <=2 SENSITIVE Sensitive     CEFAZOLIN <=4 SENSITIVE Sensitive     CEFTRIAXONE <=1 SENSITIVE Sensitive     CIPROFLOXACIN <=0.25 SENSITIVE Sensitive     GENTAMICIN <=1 SENSITIVE Sensitive     IMIPENEM 1 SENSITIVE Sensitive     NITROFURANTOIN 128 RESISTANT Resistant     TRIMETH/SULFA <=20 SENSITIVE Sensitive     AMPICILLIN/SULBACTAM <=2 SENSITIVE Sensitive     PIP/TAZO <=4 SENSITIVE Sensitive     * 60,000 COLONIES/mL PROTEUS MIRABILIS  Blood Culture (routine x 2)     Status: None   Collection Time: 08/22/17 12:53 PM  Result Value Ref Range Status   Specimen Description BLOOD RIGHT ARM  Final   Special Requests   Final    BOTTLES DRAWN AEROBIC AND ANAEROBIC Blood Culture results may not be optimal due to an excessive volume of blood received in culture bottles   Culture   Final    NO GROWTH 5 DAYS Performed at [facility], [address] [place], [place] [address]    Report Status 08/27/2017 FINAL  Final  MRSA PCR Screening     Status: None   Collection Time: 08/22/17  9:18 PM  Result Value Ref Range Status   MRSA by PCR NEGATIVE NEGATIVE Final    Comment:        The GeneXpert MRSA Assay (FDA approved for NASAL specimens only), is one component of a comprehensive MRSA colonization surveillance program. It is not intended to diagnose MRSA infection nor to guide or monitor treatment for MRSA infections. Performed at [facility], [address] [place], [place] [address]     Coagulation Studies: No results for input(s): LABPROT, INR in the last 72 hours.  Urinalysis: No results for input(s): COLORURINE, LABSPEC, PHURINE, GLUCOSEU, HGBUR, BILIRUBINUR, KETONESUR, PROTEINUR, UROBILINOGEN, NITRITE, LEUKOCYTESUR in the last 72 hours.  Invalid input(s): APPERANCEUR    Imaging: Dg Chest Port 1 View  Result Date: 08/26/2017 CLINICAL DATA:  Respiratory failure. EXAM: PORTABLE CHEST 1 VIEW  COMPARISON:  08/25/2017.  08/22/2017. FINDINGS: Right PICC line noted with tip noted over the superior vena cava. Stable cardiomegaly. Persistent left base infiltrate and left-sided pleural effusion noted.  Questionable nodular density noted over the left apex. A PA and lateral chest x-ray suggested for further evaluation when the patient is clinically capable. No pneumothorax. Degenerative changes thoracic spine. IMPRESSION: 1.  Right PICC line noted with tip over superior vena cava. 2. Persistent left base infiltrate and left-sided pleural effusion. No interim change. 3. Questionable nodular density over the left pulmonary apex. A PA and lateral chest x-ray is suggested for further evaluation when the patient is clinically capable. 4.  Stable cardiomegaly. Electronically Signed   By: Marcello Moores  Register   On: 08/26/2017 05:57   [place] Ekg Site Rite  Result Date: 08/25/2017 If Site Rite image not attached, placement could not be confirmed due to current cardiac rhythm.    Medications:   . albumin human 12.5 g (08/27/17 0523)  . cefTRIAXone (ROCEPHIN)  IV Stopped (08/26/17 1207)  . dextrose 5% lactated ringers 50 mL/hr at 08/27/17 0900  . doxycycline (VIBRAMYCIN) IV Stopped (08/27/17 0110)  . norepinephrine (LEVOPHED) Adult infusion Stopped (08/27/17 0135)   . letrozole  2.5 mg Oral Daily  . metoprolol succinate  50 mg Oral Daily  . sodium chloride flush  10-40 mL Intracatheter Q12H   acetaminophen **OR** acetaminophen, bisacodyl, HYDROcodone-acetaminophen, ipratropium-albuterol, [DISCONTINUED] ondansetron **OR** ondansetron (ZOFRAN) IV, sodium chloride flush  Assessment/ Plan:  72 y.o. female with diabetes, coronary disease, sleep apnea, history of breast cancer, was admitted on 08/22/2017 with fever, chills, tick bite.   1.  Acute renal failure 2.  Sepsis 3.  Urinary tract infection-Klebsiella, Proteus 4.  Elevated troponin/demand ischemia 5.  Hypotension 6.  Thrombocytopenia 7.   Hyponatremia, Hypokalemia 8.  Edema  Patient likely has acute renal failure from severe ATN.  She has developed anuria with worsening serum creatinine and BUN. She is appropriately being treated for sepsis and urinary tract infection. Pressors are off at present.  Electrolytes and volume status are acceptable.  No acute indication for dialysis at present. She is at high risk of bleeding from procedures due to low platelets We will continue to monitor closely. Discussed with family.  nepro supplements     LOS: 5 Alleya Demeter Candiss Norse 6/14/20199:58 AM  [place], [place]  Note: This note was prepared with Dragon dictation. Any transcription errors are unintentional

## 2017-08-28 ENCOUNTER — Inpatient Hospital Stay: Payer: PPO

## 2017-08-28 LAB — CBC
HEMATOCRIT: 26.3 % — AB (ref 35.0–47.0)
HEMOGLOBIN: 8.9 g/dL — AB (ref 12.0–16.0)
MCH: 31.5 pg (ref 26.0–34.0)
MCHC: 33.8 g/dL (ref 32.0–36.0)
MCV: 93.2 fL (ref 80.0–100.0)
Platelets: 54 10*3/uL — ABNORMAL LOW (ref 150–440)
RBC: 2.82 MIL/uL — AB (ref 3.80–5.20)
RDW: 16.5 % — ABNORMAL HIGH (ref 11.5–14.5)
WBC: 5.5 10*3/uL (ref 3.6–11.0)

## 2017-08-28 LAB — GLUCOSE, CAPILLARY
GLUCOSE-CAPILLARY: 207 mg/dL — AB (ref 65–99)
Glucose-Capillary: 269 mg/dL — ABNORMAL HIGH (ref 65–99)
Glucose-Capillary: 219 mg/dL — ABNORMAL HIGH (ref 65–99)
Glucose-Capillary: 224 mg/dL — ABNORMAL HIGH (ref 65–99)

## 2017-08-28 LAB — COMPREHENSIVE METABOLIC PANEL
ALK PHOS: 127 U/L — AB (ref 38–126)
ALT: 15 U/L (ref 14–54)
ANION GAP: 14 (ref 5–15)
AST: 134 U/L — ABNORMAL HIGH (ref 15–41)
Albumin: 2.8 g/dL — ABNORMAL LOW (ref 3.5–5.0)
BILIRUBIN TOTAL: 0.7 mg/dL (ref 0.3–1.2)
BUN: 81 mg/dL — ABNORMAL HIGH (ref 6–20)
CALCIUM: 7.1 mg/dL — AB (ref 8.9–10.3)
CO2: 14 mmol/L — AB (ref 22–32)
CREATININE: 9.31 mg/dL — AB (ref 0.44–1.00)
Chloride: 100 mmol/L — ABNORMAL LOW (ref 101–111)
GFR calc non Af Amer: 4 mL/min — ABNORMAL LOW (ref >60)
GFR, EST AFRICAN AMERICAN: 4 mL/min — AB (ref >60)
GLUCOSE: 253 mg/dL — AB (ref 65–99)
Potassium: 3.7 mmol/L (ref 3.5–5.1)
SODIUM: 128 mmol/L — AB (ref 135–145)
Total Protein: 5.5 g/dL — ABNORMAL LOW (ref 6.5–8.1)

## 2017-08-28 LAB — PHOSPHORUS: PHOSPHORUS: 6.2 mg/dL — AB (ref 2.5–4.6)

## 2017-08-28 LAB — MAGNESIUM: Magnesium: 2.5 mg/dL — ABNORMAL HIGH (ref 1.7–2.4)

## 2017-08-28 LAB — PROCALCITONIN: PROCALCITONIN: 13.17 ng/mL

## 2017-08-28 MED ORDER — HYDRALAZINE HCL 20 MG/ML IJ SOLN
10.0000 mg | INTRAMUSCULAR | Status: DC | PRN
  Administered 2017-08-29 – 2017-09-07 (×24): 10 mg via INTRAVENOUS
  Filled 2017-08-28 (×24): qty 1

## 2017-08-28 MED ORDER — FUROSEMIDE 10 MG/ML IJ SOLN
80.0000 mg | Freq: Once | INTRAMUSCULAR | Status: AC
  Administered 2017-08-28: 80 mg via INTRAVENOUS
  Filled 2017-08-28: qty 8

## 2017-08-28 NOTE — Progress Notes (Signed)
[place] [facility] [place] NAME: Bonnie Holt    MR#:  [id number]  DATE OF BIRTH:  [birth year]  SUBJECTIVE:   Mental status continues to wax and wane.  Patient's family is at bedside. Renal function not much improved since yesterday and patient remains anuric.  REVIEW OF SYSTEMS:   Review of Systems  Constitutional: Negative for chills, fever and weight loss.  HENT: Negative for ear discharge, ear pain and nosebleeds.   Eyes: Negative for blurred vision, pain and discharge.  Respiratory: Positive for shortness of breath. Negative for sputum production, wheezing and stridor.   Cardiovascular: Positive for leg swelling and PND. Negative for chest pain, palpitations and orthopnea.  Gastrointestinal: Negative for abdominal pain, diarrhea, nausea and vomiting.  Genitourinary: Negative for frequency and urgency.  Musculoskeletal: Negative for back pain and joint pain.  Neurological: Negative for sensory change, speech change, focal weakness and weakness ().  Psychiatric/Behavioral: Negative for depression and hallucinations. The patient is not nervous/anxious.    Tolerating Diet: yes some. Tolerating PT: pending  DRUG ALLERGIES:   Allergies  Allergen Reactions  . Ace Inhibitors Cough    VITALS:  Blood pressure (!) 175/74, pulse 77, temperature 97.7 F (36.5 C), temperature source Axillary, resp. rate (!) 21, height 5\' 2"  (1.575 m), weight 107.1 kg (236 lb 1.8 oz), SpO2 97 %.  PHYSICAL EXAMINATION:   Physical Exam  GENERAL:  72 y.o.-year-old obese patient lying in the bed in no acute distress.  EYES: Pupils equal, round, reactive to light and accommodation. No scleral icterus. Extraocular muscles intact.  HEENT: Head atraumatic, normocephalic. Oropharynx and nasopharynx clear.  NECK:  Supple, no jugular venous distention. No thyroid enlargement, no tenderness.  LUNGS: Poor Resp. effort, no wheezing, rales, rhonchi. No use of accessory  muscles of respiration.  CARDIOVASCULAR: S1, S2 normal. No murmurs, rubs, or gallops.  ABDOMEN: Soft, nontender, nondistended. Bowel sounds present. No organomegaly or mass.  EXTREMITIES: No cyanosis, clubbing, +2 edema from ankles to knees b/l.    NEUROLOGIC: grossly nonfocal moves all extremities spontaneously. Globally weak.  PSYCHIATRIC: AAO X 2.   SKIN: No obvious rash, lesion, or ulcer.   LABORATORY PANEL:  CBC Recent Labs  Lab 08/28/17 0734  WBC 5.5  HGB 8.9*  HCT 26.3*  PLT 54*    Chemistries  Recent Labs  Lab 08/28/17 0831  NA 128*  K 3.7  CL 100*  CO2 14*  GLUCOSE 253*  BUN 81*  CREATININE 9.31*  CALCIUM 7.1*  MG 2.5*  AST 134*  ALT 15  ALKPHOS 127*  BILITOT 0.7   Cardiac Enzymes Recent Labs  Lab 08/23/17 1315  TROPONINI 2.02*   RADIOLOGY:  US Abdomen Complete  Result Date: 08/28/2017 CLINICAL DATA:  Elevated liver function tests.  Acute renal failure. EXAM: ABDOMEN ULTRASOUND COMPLETE COMPARISON:  None. FINDINGS: Gallbladder: 1.6 cm gallstone within the neck. Nonmobile. No wall thickening or pericholecystic fluid. Sonographic Murphy's sign was not elicited. Common bile duct: Diameter: Normal, 4 mm. Liver: Moderately increased hepatic echogenicity. Presumed sparing adjacent to the gallbladder and possibly within the caudate lobe. Portal vein is patent on color Doppler imaging with normal direction of blood flow towards the liver. IVC: No abnormality visualized. Pancreas: Pancreatic tail partially obscured by bowel gas. Spleen: Size and appearance within normal limits. Right Kidney: Length: 12.9 cm. Echogenicity within normal limits. No mass or hydronephrosis visualized. Left Kidney: Length: 12.3 cm. Echogenicity within normal limits. No mass or hydronephrosis visualized. Abdominal aorta:  Atherosclerotic irregularity.  No aneurysm. Other findings: No ascites. Possible gastric distension, including on image 104. Suboptimally evaluated. IMPRESSION: 1. Increased  echogenicity throughout the liver, suggesting steatosis. Areas of presumed pericholecystic fat sparing and possible caudate lobe fat sparing. Consider ultrasound follow-up at 6 months or further characterization with nonemergent outpatient pre and post contrast abdominal MRI. 2. Cholelithiasis with a stone in the gallbladder neck, immobile. No evidence of acute cholecystitis or biliary duct dilatation. 3.  Aortic Atherosclerosis (ICD10-I70.0). 4. Questionable gastric distension, suboptimally evaluated. Electronically Signed   By: Abigail Miyamoto M.D.   On: 08/28/2017 10:57   ASSESSMENT AND PLAN:   Bonnie Holt is a 72 y.o. female has a past medical history significant for DM, HTN, and CAD now with 3 day hx of fever, chills, and back apin. Now confused and lethargic. In ER, pt febrile and tachycardic. Lactic acid elevated c/w with sepsis. UA abnormal and CXR negative  1. clinical sepsis secondary to likely UTI/suspected RMSF or ehrlichiosis - Urine cultures are + for  Klebsiella pneumonia and Proteus -blood culture is negative - Continue IV ceftriaxone, doxycycline.   -Appreciate infectious disease input.  Patient's [facility] spotted fever serologies negative.  Ehrlichiosis serologies have been sent but currently pending.  2. acute hypoxic respiratory failure secondary to pulmonary edema in the setting of IV fluid volume overload -patient was transferred to ICU step down for BiPAP. Feels a lot better and off Bipap now. - currently on nasal cannula doing well.  3 Acute Renal failure - likely due to ATN from sepsis.  - pt. Is anuric.  Cr. Up to 9 today.  - seen by nephrology and they would like to wait another 24 hours to see if the patient's urine output improves.  If not they may consider starting the patient on hemodialysis temporarily if needed.  Her electrolytes are currently stable.  4. Elevated troponin - demand ischemia in the setting of sepsis -patient denies any chest pain -cardiology  consultation placed with dr [name] and no plan for any acute intervention.  - Meds on hold due to sepsis. Hypotension.  -echo shows EF 50-55% with no wall motional abnormalities.   5. Hx of Breast Cancer - cont. Femara  Discussed plan of care with pt's family at bedside.   Case discussed with Care Management/Social Worker. Management plans discussed with the patient, family and they are in agreement.  CODE STATUS: full  DVT Prophylaxis: hepairn  TOTAL TIME TAKING CARE OF THIS PATIENT: 30 mins   POSSIBLE D/C unclear, DEPENDING ON CLINICAL CONDITION and course.  Note: This dictation was prepared with Dragon dictation along with smaller phrase technology. Any transcriptional errors that result from this process are unintentional.  [name] M.D on 08/28/2017 at 1:11 PM  Between 7am to 6pm - Pager - [phone]  After 6pm go to [email or web address] - password [email or web address] [facility]  Office  [phone]  CC: Primary care physician; Kirk Ruths, MDPatient ID: Bonnie Holt, female   DOB: 13-Mar-1946, 72 y.o.   MRN: [id number]

## 2017-08-28 NOTE — Plan of Care (Addendum)
Patient was very confused this shift. Telesitter at bedside.  Asking for husband and sister.  Redirection needed.  PRN pain med given x 1.  Patient voiced no complaints of pain.  Tolerated medications.  Will continue to monitor.   --Patient has been NPO since midnight as ordered in preparation for abdominal ultrasound this am.

## 2017-08-28 NOTE — Progress Notes (Signed)
[facility], [place] 08/28/17  Subjective:   Mental status is waxing and waning.  Appetite is poor.  However, she did tolerate some Nepro Continues to have large amount of edema Breathing status is stable compared to yesterday.  Still requiring oxygen by nasal cannula Potassium is  now normal  Objective:  Vital signs in last 24 hours:  Temp:  [97.6 F (36.4 C)-98.7 F (37.1 C)] 97.7 F (36.5 C) (06/15 1200) Pulse Rate:  [77-92] 77 (06/15 1036) Resp:  [13-22] 21 (06/15 1000) BP: (105-175)/(52-74) 175/74 (06/15 1036) SpO2:  [91 %-99 %] 97 % (06/15 1000) Weight:  [107.1 kg (236 lb 1.8 oz)] 107.1 kg (236 lb 1.8 oz) (06/15 0500)  Weight change:  Filed Weights   08/24/17 0500 08/26/17 0443 08/28/17 0500  Weight: 102.4 kg (225 lb 12 oz) 107.2 kg (236 lb 5.3 oz) 107.1 kg (236 lb 1.8 oz)    Intake/Output:    Intake/Output Summary (Last 24 hours) at 08/28/2017 1321 Last data filed at 08/28/2017 1059 Gross per 24 hour  Intake 1142.92 ml  Output 172 ml  Net 970.92 ml   Physical Exam: General:  Critically ill-appearing, laying in the bed  HEENT  anicteric, dry mouth  Neck:  Supple  Lungs:  Normal breathing effort, clear anteriorly and laterally,   Heart::  Tachycardic, irregular A Fib  Abdomen:  Soft, nontender  Extremities:  ++ edema  Neurologic:   Alert and is able to follow commands, answers few questions  Skin:  No acute rashes    Basic Metabolic Panel:  Recent Labs  Lab 08/22/17 2050  08/26/17 1145 08/26/17 1709 08/27/17 0441 08/27/17 1700 08/28/17 0831  NA 131*   < > 127* 129* 129* 128* 128*  K 3.5   < > 3.6 3.7 3.4* 3.7 3.7  CL 104   < > 101 98* 99* 99* 100*  CO2 14*   < > 16* 15* 17* 15* 14*  GLUCOSE 231*   < > 125* 153* 139* 173* 253*  BUN 23*   < > 62* 67* 74* 77* 81*  CREATININE 1.47*   < > 7.26* 7.59* 8.42* 8.93* 9.31*  CALCIUM 7.7*   < > 6.6* 6.9* 7.1* 7.0* 7.1*  MG 1.3*  --   --   --   --   --  2.5*  PHOS 2.7  --   --    --   --   --  6.2*   < > = values in this interval not displayed.     CBC: Recent Labs  Lab 08/22/17 1252  08/24/17 0318 08/25/17 0648 08/26/17 0501 08/27/17 0441 08/28/17 0734  WBC 4.1   < > 2.7* 3.5* 3.9 4.5 5.5  NEUTROABS 3.7  --   --   --   --   --   --   HGB 12.7   < > 10.6* 10.2* 9.4* 9.9* 8.9*  HCT 36.8   < > 30.4* 29.0* 27.1* 28.7* 26.3*  MCV 91.7   < > 90.9 91.8 91.6 91.9 93.2  PLT 92*   < > 43* 40* 44* 51* 54*   < > = values in this interval not displayed.     No results found for: HEPBSAG, HEPBSAB, HEPBIGM    Microbiology:  Recent Results (from the past 240 hour(s))  Blood Culture (routine x 2)     Status: None   Collection Time: 08/22/17 12:52 PM  Result Value Ref Range Status   Specimen Description BLOOD  RIGHT HAND  Final   Special Requests   Final    BOTTLES DRAWN AEROBIC AND ANAEROBIC Blood Culture adequate volume   Culture   Final    NO GROWTH 5 DAYS Performed at [facility], [address] [place], [place] [address]    Report Status 08/27/2017 FINAL  Final  Urine culture     Status: Abnormal   Collection Time: 08/22/17 12:52 PM  Result Value Ref Range Status   Specimen Description   Final    URINE, RANDOM Performed at [facility], [address], [place], [place] [address]    Special Requests   Final    NONE Performed at [facility], [address] [place], [place] [address]    Culture (A)  Final    >=100,000 COLONIES/mL KLEBSIELLA PNEUMONIAE 60,000 COLONIES/mL PROTEUS MIRABILIS    Report Status 08/24/2017 FINAL  Final   Organism ID, Bacteria KLEBSIELLA PNEUMONIAE (A)  Final   Organism ID, Bacteria PROTEUS MIRABILIS (A)  Final      Susceptibility   Klebsiella pneumoniae - MIC*    AMPICILLIN RESISTANT Resistant     CEFAZOLIN <=4 SENSITIVE Sensitive     CEFTRIAXONE <=1 SENSITIVE Sensitive     CIPROFLOXACIN <=0.25 SENSITIVE Sensitive     GENTAMICIN <=1 SENSITIVE Sensitive     IMIPENEM <=0.25 SENSITIVE  Sensitive     NITROFURANTOIN 64 INTERMEDIATE Intermediate     TRIMETH/SULFA <=20 SENSITIVE Sensitive     AMPICILLIN/SULBACTAM 4 SENSITIVE Sensitive     PIP/TAZO <=4 SENSITIVE Sensitive     Extended ESBL NEGATIVE Sensitive     * >=100,000 COLONIES/mL KLEBSIELLA PNEUMONIAE   Proteus mirabilis - MIC*    AMPICILLIN <=2 SENSITIVE Sensitive     CEFAZOLIN <=4 SENSITIVE Sensitive     CEFTRIAXONE <=1 SENSITIVE Sensitive     CIPROFLOXACIN <=0.25 SENSITIVE Sensitive     GENTAMICIN <=1 SENSITIVE Sensitive     IMIPENEM 1 SENSITIVE Sensitive     NITROFURANTOIN 128 RESISTANT Resistant     TRIMETH/SULFA <=20 SENSITIVE Sensitive     AMPICILLIN/SULBACTAM <=2 SENSITIVE Sensitive     PIP/TAZO <=4 SENSITIVE Sensitive     * 60,000 COLONIES/mL PROTEUS MIRABILIS  Blood Culture (routine x 2)     Status: None   Collection Time: 08/22/17 12:53 PM  Result Value Ref Range Status   Specimen Description BLOOD RIGHT ARM  Final   Special Requests   Final    BOTTLES DRAWN AEROBIC AND ANAEROBIC Blood Culture results may not be optimal due to an excessive volume of blood received in culture bottles   Culture   Final    NO GROWTH 5 DAYS Performed at [facility] [place], [address] [place], [place] [address]    Report Status 08/27/2017 FINAL  Final  MRSA PCR Screening     Status: None   Collection Time: 08/22/17  9:18 PM  Result Value Ref Range Status   MRSA by PCR NEGATIVE NEGATIVE Final    Comment:        The GeneXpert MRSA Assay (FDA approved for NASAL specimens only), is one component of a comprehensive MRSA colonization surveillance program. It is not intended to diagnose MRSA infection nor to guide or monitor treatment for MRSA infections. Performed at [facility], [address] [place], [place] [address]     Coagulation Studies: No results for input(s): LABPROT, INR in the last 72 hours.  Urinalysis: No results for input(s): COLORURINE, LABSPEC, [place], [place], [place],  BILIRUBINUR, KETONESUR,  PROTEINUR, UROBILINOGEN, NITRITE, LEUKOCYTESUR in the last 72 hours.  Invalid input(s): APPERANCEUR    Imaging: US Abdomen Complete  Result Date: 08/28/2017 CLINICAL DATA:  Elevated liver function tests.  Acute renal failure. EXAM: ABDOMEN ULTRASOUND COMPLETE COMPARISON:  None. FINDINGS: Gallbladder: 1.6 cm gallstone within the neck. Nonmobile. No wall thickening or pericholecystic fluid. Sonographic Murphy's sign was not elicited. Common bile duct: Diameter: Normal, 4 mm. Liver: Moderately increased hepatic echogenicity. Presumed sparing adjacent to the gallbladder and possibly within the caudate lobe. Portal vein is patent on color Doppler imaging with normal direction of blood flow towards the liver. IVC: No abnormality visualized. Pancreas: Pancreatic tail partially obscured by bowel gas. Spleen: Size and appearance within normal limits. Right Kidney: Length: 12.9 cm. Echogenicity within normal limits. No mass or hydronephrosis visualized. Left Kidney: Length: 12.3 cm. Echogenicity within normal limits. No mass or hydronephrosis visualized. Abdominal aorta: Atherosclerotic irregularity.  No aneurysm. Other findings: No ascites. Possible gastric distension, including on image 104. Suboptimally evaluated. IMPRESSION: 1. Increased echogenicity throughout the liver, suggesting steatosis. Areas of presumed pericholecystic fat sparing and possible caudate lobe fat sparing. Consider ultrasound follow-up at 6 months or further characterization with nonemergent outpatient pre and post contrast abdominal MRI. 2. Cholelithiasis with a stone in the gallbladder neck, immobile. No evidence of acute cholecystitis or biliary duct dilatation. 3.  Aortic Atherosclerosis (ICD10-I70.0). 4. Questionable gastric distension, suboptimally evaluated. Electronically Signed   By: Abigail Miyamoto M.D.   On: 08/28/2017 10:57     Medications:   . cefTRIAXone (ROCEPHIN)  IV Stopped (08/28/17 1113)  .  dextrose 5% lactated ringers Stopped (08/28/17 0748)  . doxycycline (VIBRAMYCIN) IV 100 mg (08/28/17 1043)  . norepinephrine (LEVOPHED) Adult infusion Stopped (08/27/17 0135)   . feeding supplement (NEPRO CARB STEADY)  237 mL Oral BID BM  . letrozole  2.5 mg Oral Daily  . metoprolol succinate  50 mg Oral Daily  . sodium chloride flush  10-40 mL Intracatheter Q12H   acetaminophen **OR** acetaminophen, bisacodyl, hydrALAZINE, HYDROcodone-acetaminophen, ipratropium-albuterol, [DISCONTINUED] ondansetron **OR** ondansetron (ZOFRAN) IV, sodium chloride flush  Assessment/ Plan:  72 y.o. female with diabetes, coronary disease, sleep apnea, history of breast cancer, was admitted on 08/22/2017 with fever, chills, tick bite.   1.  Acute renal failure 2.  Sepsis 3.  Urinary tract infection-Klebsiella, Proteus 4.  Elevated troponin/demand ischemia 5.  Hypotension 6.  Thrombocytopenia 7.  Hyponatremia, Hypokalemia 8.  Edema  Patient likely has acute renal failure from severe ATN.  She has developed anuria with worsening serum creatinine and BUN. She is appropriately being treated for sepsis and urinary tract infection. Off pressors. UOP may be increasing slightly Electrolytes and volume status are acceptable.  No acute indication for dialysis at present. She is at high risk of bleeding from procedures due to low platelets We will continue to monitor closely. Discussed with family. Continue  nepro supplements Trial of one dose of iv Lasix     LOS: 6 Rajat Staver 6/15/20191:21 PM  [place], [place]  Note: This note was prepared with Dragon dictation. Any transcription errors are unintentional

## 2017-08-28 NOTE — Progress Notes (Addendum)
No problems overnight.  Cognition improved.  Appears more comfortable.  More awake and alert.  Conversant and well oriented.  No distress  Vitals:   08/28/17 0500 08/28/17 0600 08/28/17 0700 08/28/17 0747  BP: (!) 147/60 (!) 141/58 (!) 141/62   Pulse: 82 81 81   Resp: 19  16   Temp:    98.6 F (37 C)  TempSrc:    Oral  SpO2: 94% 95% 95%   Weight: 236 lb 1.8 oz (107.1 kg)     Height:       Gen: NAD HEENT: NCAT, sclerae white Neck: JVP not visualized Lungs: Effort normal, no wheezes, left basilar crackles and bronchial breath sounds Cardiovascular: Regular, no M Abdomen: Soft, NT, NABS Ext: Symmetric pretibial and ankle edema Neuro: No focal deficits Skin: Facial flushing has resolved  BMP Latest Ref Rng & Units 08/27/2017 08/27/2017 08/26/2017  Glucose 65 - 99 mg/dL 173(H) 139(H) 153(H)  BUN 6 - 20 mg/dL 77(H) 74(H) 67(H)  Creatinine 0.44 - 1.00 mg/dL 8.93(H) 8.42(H) 7.59(H)  Sodium 135 - 145 mmol/L 128(L) 129(L) 129(L)  Potassium 3.5 - 5.1 mmol/L 3.7 3.4(L) 3.7  Chloride 101 - 111 mmol/L 99(L) 99(L) 98(L)  CO2 22 - 32 mmol/L 15(L) 17(L) 15(L)  Calcium 8.9 - 10.3 mg/dL 7.0(L) 7.1(L) 6.9(L)   CBC Latest Ref Rng & Units 08/28/2017 08/27/2017 08/26/2017  WBC 3.6 - 11.0 K/uL 5.5 4.5 3.9  Hemoglobin 12.0 - 16.0 g/dL 8.9(L) 9.9(L) 9.4(L)  Hematocrit 35.0 - 47.0 % 26.3(L) 28.7(L) 27.1(L)  Platelets 150 - 440 K/uL 54(L) 51(L) 44(L)   CXR: Increased LLL airspace disease with probable small left effusion  IMP: Sepsis syndrome. Greatly appreciate Dr. Ola Spurr consult, clinical scenario consistent with Erlichia versus [facility] spotted fever. Presently on doxycycline and Rocephin.  Acute hypoxemic respiratory failure. Resolving pulmonary edema with possible LLL pneumonia  Mildly elevated troponin I - likely demand ischemia  AKI, nonoliguric Pending BMP from this morning hopefully can avoid hemodialysis.  Type 2 diabetes. On scale coverage  Thrombocytopenia. Slowly  improving  Acute encephalopathy, improving   Critical Care Time [place], DO  08/28/2017 9:11 AM  Patient ID: Bonnie Holt, female   DOB: 07/04/1945, 72 y.o.   MRN: [id number] Patient ID: Bonnie Holt, female   DOB: 1946-03-14, 72 y.o.   MRN: [id number]

## 2017-08-29 ENCOUNTER — Inpatient Hospital Stay: Payer: PPO

## 2017-08-29 LAB — COMPREHENSIVE METABOLIC PANEL
ALT: 12 U/L — ABNORMAL LOW (ref 14–54)
ANION GAP: 17 — AB (ref 5–15)
AST: 69 U/L — ABNORMAL HIGH (ref 15–41)
Albumin: 2.7 g/dL — ABNORMAL LOW (ref 3.5–5.0)
Alkaline Phosphatase: 134 U/L — ABNORMAL HIGH (ref 38–126)
BUN: 91 mg/dL — ABNORMAL HIGH (ref 6–20)
CALCIUM: 7.7 mg/dL — AB (ref 8.9–10.3)
CO2: 15 mmol/L — AB (ref 22–32)
Chloride: 101 mmol/L (ref 101–111)
Creatinine, Ser: 10.07 mg/dL — ABNORMAL HIGH (ref 0.44–1.00)
GFR, EST AFRICAN AMERICAN: 4 mL/min — AB (ref >60)
GFR, EST NON AFRICAN AMERICAN: 3 mL/min — AB (ref >60)
Glucose, Bld: 213 mg/dL — ABNORMAL HIGH (ref 65–99)
Potassium: 4.3 mmol/L (ref 3.5–5.1)
SODIUM: 133 mmol/L — AB (ref 135–145)
TOTAL PROTEIN: 6 g/dL — AB (ref 6.5–8.1)
Total Bilirubin: 0.6 mg/dL (ref 0.3–1.2)

## 2017-08-29 LAB — BASIC METABOLIC PANEL
Anion gap: 14 (ref 5–15)
BUN: 96 mg/dL — AB (ref 6–20)
CHLORIDE: 100 mmol/L — AB (ref 101–111)
CO2: 15 mmol/L — ABNORMAL LOW (ref 22–32)
Calcium: 7.6 mg/dL — ABNORMAL LOW (ref 8.9–10.3)
Creatinine, Ser: 10.86 mg/dL — ABNORMAL HIGH (ref 0.44–1.00)
GFR calc Af Amer: 4 mL/min — ABNORMAL LOW (ref >60)
GFR calc non Af Amer: 3 mL/min — ABNORMAL LOW (ref >60)
GLUCOSE: 192 mg/dL — AB (ref 65–99)
POTASSIUM: 4.2 mmol/L (ref 3.5–5.1)
Sodium: 129 mmol/L — ABNORMAL LOW (ref 135–145)

## 2017-08-29 LAB — PROCALCITONIN: PROCALCITONIN: 6.81 ng/mL

## 2017-08-29 LAB — CBC
HCT: 30.3 % — ABNORMAL LOW (ref 35.0–47.0)
HEMATOCRIT: 31.5 % — AB (ref 35.0–47.0)
HEMOGLOBIN: 10.4 g/dL — AB (ref 12.0–16.0)
Hemoglobin: 10.7 g/dL — ABNORMAL LOW (ref 12.0–16.0)
MCH: 31.6 pg (ref 26.0–34.0)
MCH: 31.8 pg (ref 26.0–34.0)
MCHC: 33.9 g/dL (ref 32.0–36.0)
MCHC: 34.4 g/dL (ref 32.0–36.0)
MCV: 93 fL (ref 80.0–100.0)
MCV: 92.2 fL (ref 80.0–100.0)
Platelets: 63 10*3/uL — ABNORMAL LOW (ref 150–440)
Platelets: 78 10*3/uL — ABNORMAL LOW (ref 150–440)
RBC: 3.38 MIL/uL — ABNORMAL LOW (ref 3.80–5.20)
RBC: 3.29 MIL/uL — ABNORMAL LOW (ref 3.80–5.20)
RDW: 16 % — AB (ref 11.5–14.5)
RDW: 16.1 % — ABNORMAL HIGH (ref 11.5–14.5)
WBC: 8.3 10*3/uL (ref 3.6–11.0)
WBC: 8.2 10*3/uL (ref 3.6–11.0)

## 2017-08-29 LAB — BLOOD GAS, ARTERIAL
Acid-base deficit: 11.2 mmol/L — ABNORMAL HIGH (ref 0.0–2.0)
BICARBONATE: 16.4 mmol/L — AB (ref 20.0–28.0)
FIO2: 28
O2 SAT: 86.4 %
PATIENT TEMPERATURE: 34.4
PCO2 ART: 43 mmHg (ref 32.0–48.0)
PO2 ART: 54 mmHg — AB (ref 83.0–108.0)
pH, Arterial: 7.22 — ABNORMAL LOW (ref 7.350–7.450)

## 2017-08-29 LAB — FIBRIN DERIVATIVES D-DIMER (ARMC ONLY): FIBRIN DERIVATIVES D-DIMER (ARMC): 5998.42 ng{FEU}/mL — AB (ref 0.00–499.00)

## 2017-08-29 LAB — GLUCOSE, CAPILLARY
GLUCOSE-CAPILLARY: 212 mg/dL — AB (ref 65–99)
GLUCOSE-CAPILLARY: 215 mg/dL — AB (ref 65–99)
Glucose-Capillary: 185 mg/dL — ABNORMAL HIGH (ref 65–99)
Glucose-Capillary: 216 mg/dL — ABNORMAL HIGH (ref 65–99)
Glucose-Capillary: 190 mg/dL — ABNORMAL HIGH (ref 65–99)
Glucose-Capillary: 206 mg/dL — ABNORMAL HIGH (ref 65–99)

## 2017-08-29 LAB — TROPONIN I: TROPONIN I: 0.11 ng/mL — AB (ref <0.03)

## 2017-08-29 LAB — PHOSPHORUS: PHOSPHORUS: 7.7 mg/dL — AB (ref 2.5–4.6)

## 2017-08-29 LAB — MAGNESIUM: MAGNESIUM: 2.7 mg/dL — AB (ref 1.7–2.4)

## 2017-08-29 LAB — CK: Total CK: 98 U/L (ref 38–234)

## 2017-08-29 LAB — FIBRINOGEN: Fibrinogen: 421 mg/dL (ref 210–475)

## 2017-08-29 LAB — LACTIC ACID, PLASMA: Lactic Acid, Venous: 0.7 mmol/L (ref 0.5–1.9)

## 2017-08-29 LAB — PROTIME-INR
INR: 1.08
Prothrombin Time: 13.9 seconds (ref 11.4–15.2)

## 2017-08-29 LAB — ABO/RH: ABO/RH(D): O POS

## 2017-08-29 MED ORDER — ALTEPLASE 2 MG IJ SOLR
INTRAMUSCULAR | Status: AC
  Administered 2017-08-29: 2 mg
  Filled 2017-08-29: qty 2

## 2017-08-29 MED ORDER — SODIUM BICARBONATE 8.4 % IV SOLN
150.0000 meq | Freq: Once | INTRAVENOUS | Status: AC
  Administered 2017-08-29: 150 meq via INTRAVENOUS
  Filled 2017-08-29: qty 150

## 2017-08-29 MED ORDER — FUROSEMIDE 10 MG/ML IJ SOLN
80.0000 mg | Freq: Once | INTRAMUSCULAR | Status: AC
  Administered 2017-08-29: 80 mg via INTRAVENOUS
  Filled 2017-08-29: qty 8

## 2017-08-29 MED ORDER — FENTANYL CITRATE (PF) 100 MCG/2ML IJ SOLN: 100.0000 ug | Freq: Once | INTRAMUSCULAR | Status: DC

## 2017-08-29 MED ORDER — ORAL CARE MOUTH RINSE
15.0000 mL | Freq: Two times a day (BID) | OROMUCOSAL | Status: DC
  Administered 2017-08-29 – 2017-08-31 (×6): 15 mL via OROMUCOSAL

## 2017-08-29 MED ORDER — SODIUM CHLORIDE 0.9 % IV SOLN
Freq: Once | INTRAVENOUS | Status: AC
  Administered 2017-08-29: 13:00:00 via INTRAVENOUS

## 2017-08-29 MED ORDER — INSULIN ASPART 100 UNIT/ML ~~LOC~~ SOLN
0.0000 [IU] | Freq: Three times a day (TID) | SUBCUTANEOUS | Status: DC
  Administered 2017-08-29: 2 [IU] via SUBCUTANEOUS
  Filled 2017-08-29: qty 1

## 2017-08-29 MED ORDER — STERILE WATER FOR INJECTION IJ SOLN
INTRAMUSCULAR | Status: AC
  Administered 2017-08-29: 10 mL
  Filled 2017-08-29: qty 10

## 2017-08-29 MED ORDER — FENTANYL CITRATE (PF) 100 MCG/2ML IJ SOLN
INTRAMUSCULAR | Status: AC
  Filled 2017-08-29: qty 2

## 2017-08-29 MED ORDER — LORAZEPAM 2 MG/ML IJ SOLN
INTRAMUSCULAR | Status: AC
  Administered 2017-08-29: 0.25 mg via INTRAVENOUS
  Filled 2017-08-29: qty 1

## 2017-08-29 MED ORDER — INSULIN ASPART 100 UNIT/ML ~~LOC~~ SOLN
0.0000 [IU] | SUBCUTANEOUS | Status: DC
  Administered 2017-08-29 – 2017-08-30 (×5): 5 [IU] via SUBCUTANEOUS
  Administered 2017-08-30 (×2): 3 [IU] via SUBCUTANEOUS
  Administered 2017-08-31: 5 [IU] via SUBCUTANEOUS
  Administered 2017-08-31 (×2): 3 [IU] via SUBCUTANEOUS
  Administered 2017-08-31: 5 [IU] via SUBCUTANEOUS
  Administered 2017-08-31: 3 [IU] via SUBCUTANEOUS
  Administered 2017-09-01 (×2): 2 [IU] via SUBCUTANEOUS
  Administered 2017-09-01 (×4): 3 [IU] via SUBCUTANEOUS
  Administered 2017-09-02 (×3): 2 [IU] via SUBCUTANEOUS
  Administered 2017-09-03: 3 [IU] via SUBCUTANEOUS
  Administered 2017-09-03 – 2017-09-04 (×3): 2 [IU] via SUBCUTANEOUS
  Administered 2017-09-04 (×2): 3 [IU] via SUBCUTANEOUS
  Administered 2017-09-04 – 2017-09-08 (×8): 2 [IU] via SUBCUTANEOUS
  Administered 2017-09-08: 3 [IU] via SUBCUTANEOUS
  Administered 2017-09-09 (×2): 2 [IU] via SUBCUTANEOUS
  Filled 2017-08-29 (×36): qty 1

## 2017-08-29 MED ORDER — ALTEPLASE 2 MG IJ SOLR
2.0000 mg | Freq: Once | INTRAMUSCULAR | Status: AC
  Administered 2017-08-29: 2 mg

## 2017-08-29 MED ORDER — CHLORHEXIDINE GLUCONATE 0.12 % MT SOLN
15.0000 mL | Freq: Two times a day (BID) | OROMUCOSAL | Status: DC
  Administered 2017-08-29 – 2017-09-15 (×31): 15 mL via OROMUCOSAL
  Filled 2017-08-29 (×21): qty 15

## 2017-08-29 MED ORDER — DEXTROSE 5 % IV SOLN
INTRAVENOUS | Status: DC
  Administered 2017-08-29: 23:00:00 via INTRAVENOUS
  Filled 2017-08-29 (×2): qty 150

## 2017-08-29 MED ORDER — LORAZEPAM 2 MG/ML IJ SOLN
2.0000 mg | Freq: Once | INTRAMUSCULAR | Status: AC
  Administered 2017-08-29 (×2): 0.25 mg via INTRAVENOUS

## 2017-08-29 MED ORDER — CHLORHEXIDINE GLUCONATE CLOTH 2 % EX PADS
6.0000 | MEDICATED_PAD | Freq: Every day | CUTANEOUS | Status: DC
  Administered 2017-08-30 – 2017-09-12 (×13): 6 via TOPICAL

## 2017-08-29 MED ORDER — HEPARIN SOD (PORK) LOCK FLUSH 100 UNIT/ML IV SOLN
500.0000 [IU] | INTRAVENOUS | Status: DC | PRN
  Filled 2017-08-29: qty 5

## 2017-08-29 NOTE — Progress Notes (Signed)
This note also relates to the following rows which could not be included: Pulse Rate - Cannot attach notes to unvalidated device data Resp - Cannot attach notes to unvalidated device data BP - Cannot attach notes to unvalidated device data  Hd completed  

## 2017-08-29 NOTE — Plan of Care (Signed)
Patient on CPAP at bedtime.  Tolerated well.  Slept most of shift. No acute distress noted.  50 ccs urine output this shift.  Will continue to monitor.

## 2017-08-29 NOTE — Progress Notes (Signed)
[facility], [place] 08/29/17  Subjective:   Mental status is waxing and waning.  Appetite is poor. Lethargic today Continues to have large amount of edema Breathing status is stable .  Still requiring oxygen by nasal cannula Potassium is normal  Objective:  Vital signs in last 24 hours:  Temp:  [96.7 F (35.9 C)-97.8 F (36.6 C)] 96.7 F (35.9 C) (06/16 0800) Pulse Rate:  [64-83] 77 (06/16 1100) Resp:  [14-20] 18 (06/16 1100) BP: (94-182)/(53-78) 168/59 (06/16 1100) SpO2:  [96 %-100 %] 96 % (06/16 1100) Weight:  [108.3 kg (238 lb 12.1 oz)] 108.3 kg (238 lb 12.1 oz) (06/16 0223)  Weight change: 1.2 kg (2 lb 10.3 oz) Filed Weights   08/26/17 0443 08/28/17 0500 08/29/17 0223  Weight: 107.2 kg (236 lb 5.3 oz) 107.1 kg (236 lb 1.8 oz) 108.3 kg (238 lb 12.1 oz)    Intake/Output:    Intake/Output Summary (Last 24 hours) at 08/29/2017 1213 Last data filed at 08/29/2017 1140 Gross per 24 hour  Intake 1038.33 ml  Output 110 ml  Net 928.33 ml   Physical Exam: General:  Critically ill-appearing, laying in the bed  HEENT  anicteric, dry mouth  Neck:  Supple  Lungs:  Normal breathing effort, clear anteriorly and laterally,   Heart::  Tachycardic, irregular A Fib  Abdomen:  Soft, nontender  Extremities:  ++ edema  Neurologic:   Able to follow few simple commands but lethargic  Skin:  No acute rashes    Basic Metabolic Panel:  Recent Labs  Lab 08/22/17 2050  08/26/17 1709 08/27/17 0441 08/27/17 1700 08/28/17 0831 08/29/17 0455  NA 131*   < > 129* 129* 128* 128* 129*  K 3.5   < > 3.7 3.4* 3.7 3.7 4.2  CL 104   < > 98* 99* 99* 100* 100*  CO2 14*   < > 15* 17* 15* 14* 15*  GLUCOSE 231*   < > 153* 139* 173* 253* 192*  BUN 23*   < > 67* 74* 77* 81* 96*  CREATININE 1.47*   < > 7.59* 8.42* 8.93* 9.31* 10.86*  CALCIUM 7.7*   < > 6.9* 7.1* 7.0* 7.1* 7.6*  MG 1.3*  --   --   --   --  2.5*  --   PHOS 2.7  --   --   --   --  6.2*  --    < > =  values in this interval not displayed.     CBC: Recent Labs  Lab 08/22/17 1252  08/25/17 0648 08/26/17 0501 08/27/17 0441 08/28/17 0734 08/29/17 0455  WBC 4.1   < > 3.5* 3.9 4.5 5.5 8.3  NEUTROABS 3.7  --   --   --   --   --   --   HGB 12.7   < > 10.2* 9.4* 9.9* 8.9* 10.7*  HCT 36.8   < > 29.0* 27.1* 28.7* 26.3* 31.5*  MCV 91.7   < > 91.8 91.6 91.9 93.2 93.0  PLT 92*   < > 40* 44* 51* 54* 63*   < > = values in this interval not displayed.     No results found for: HEPBSAG, HEPBSAB, HEPBIGM    Microbiology:  Recent Results (from the past 240 hour(s))  Blood Culture (routine x 2)     Status: None   Collection Time: 08/22/17 12:52 PM  Result Value Ref Range Status   Specimen Description BLOOD RIGHT HAND  Final  Special Requests   Final    BOTTLES DRAWN AEROBIC AND ANAEROBIC Blood Culture adequate volume   Culture   Final    NO GROWTH 5 DAYS Performed at [facility], [address] [place], [place] [address]    Report Status 08/27/2017 FINAL  Final  Urine culture     Status: Abnormal   Collection Time: 08/22/17 12:52 PM  Result Value Ref Range Status   Specimen Description   Final    URINE, RANDOM Performed at [facility], [address], [place], [place] [address]    Special Requests   Final    NONE Performed at [facility], [address] [place], [place] [address]    Culture (A)  Final    >=100,000 COLONIES/mL KLEBSIELLA PNEUMONIAE 60,000 COLONIES/mL PROTEUS MIRABILIS    Report Status 08/24/2017 FINAL  Final   Organism ID, Bacteria KLEBSIELLA PNEUMONIAE (A)  Final   Organism ID, Bacteria PROTEUS MIRABILIS (A)  Final      Susceptibility   Klebsiella pneumoniae - MIC*    AMPICILLIN RESISTANT Resistant     CEFAZOLIN <=4 SENSITIVE Sensitive     CEFTRIAXONE <=1 SENSITIVE Sensitive     CIPROFLOXACIN <=0.25 SENSITIVE Sensitive     GENTAMICIN <=1 SENSITIVE Sensitive     IMIPENEM <=0.25 SENSITIVE Sensitive     NITROFURANTOIN  64 INTERMEDIATE Intermediate     TRIMETH/SULFA <=20 SENSITIVE Sensitive     AMPICILLIN/SULBACTAM 4 SENSITIVE Sensitive     PIP/TAZO <=4 SENSITIVE Sensitive     Extended ESBL NEGATIVE Sensitive     * >=100,000 COLONIES/mL KLEBSIELLA PNEUMONIAE   Proteus mirabilis - MIC*    AMPICILLIN <=2 SENSITIVE Sensitive     CEFAZOLIN <=4 SENSITIVE Sensitive     CEFTRIAXONE <=1 SENSITIVE Sensitive     CIPROFLOXACIN <=0.25 SENSITIVE Sensitive     GENTAMICIN <=1 SENSITIVE Sensitive     IMIPENEM 1 SENSITIVE Sensitive     NITROFURANTOIN 128 RESISTANT Resistant     TRIMETH/SULFA <=20 SENSITIVE Sensitive     AMPICILLIN/SULBACTAM <=2 SENSITIVE Sensitive     PIP/TAZO <=4 SENSITIVE Sensitive     * 60,000 COLONIES/mL PROTEUS MIRABILIS  Blood Culture (routine x 2)     Status: None   Collection Time: 08/22/17 12:53 PM  Result Value Ref Range Status   Specimen Description BLOOD RIGHT ARM  Final   Special Requests   Final    BOTTLES DRAWN AEROBIC AND ANAEROBIC Blood Culture results may not be optimal due to an excessive volume of blood received in culture bottles   Culture   Final    NO GROWTH 5 DAYS Performed at [facility], [address] [place], [place] [address]    Report Status 08/27/2017 FINAL  Final  MRSA PCR Screening     Status: None   Collection Time: 08/22/17  9:18 PM  Result Value Ref Range Status   MRSA by PCR NEGATIVE NEGATIVE Final    Comment:        The GeneXpert MRSA Assay (FDA approved for NASAL specimens only), is one component of a comprehensive MRSA colonization surveillance program. It is not intended to diagnose MRSA infection nor to guide or monitor treatment for MRSA infections. Performed at [facility], [address] [place],  [address]     Coagulation Studies: No results for input(s): LABPROT, INR in the last 72 hours.  Urinalysis: No results for input(s): COLORURINE, LABSPEC, PHURINE, GLUCOSEU, HGBUR, BILIRUBINUR, KETONESUR,  PROTEINUR, UROBILINOGEN, NITRITE, LEUKOCYTESUR in the  last 72 hours.  Invalid input(s): APPERANCEUR    Imaging: US Abdomen Complete  Result Date: 08/28/2017 CLINICAL DATA:  Elevated liver function tests.  Acute renal failure. EXAM: ABDOMEN ULTRASOUND COMPLETE COMPARISON:  None. FINDINGS: Gallbladder: 1.6 cm gallstone within the neck. Nonmobile. No wall thickening or pericholecystic fluid. Sonographic Murphy's sign was not elicited. Common bile duct: Diameter: Normal, 4 mm. Liver: Moderately increased hepatic echogenicity. Presumed sparing adjacent to the gallbladder and possibly within the caudate lobe. Portal vein is patent on color Doppler imaging with normal direction of blood flow towards the liver. IVC: No abnormality visualized. Pancreas: Pancreatic tail partially obscured by bowel gas. Spleen: Size and appearance within normal limits. Right Kidney: Length: 12.9 cm. Echogenicity within normal limits. No mass or hydronephrosis visualized. Left Kidney: Length: 12.3 cm. Echogenicity within normal limits. No mass or hydronephrosis visualized. Abdominal aorta: Atherosclerotic irregularity.  No aneurysm. Other findings: No ascites. Possible gastric distension, including on image 104. Suboptimally evaluated. IMPRESSION: 1. Increased echogenicity throughout the liver, suggesting steatosis. Areas of presumed pericholecystic fat sparing and possible caudate lobe fat sparing. Consider ultrasound follow-up at 6 months or further characterization with nonemergent outpatient pre and post contrast abdominal MRI. 2. Cholelithiasis with a stone in the gallbladder neck, immobile. No evidence of acute cholecystitis or biliary duct dilatation. 3.  Aortic Atherosclerosis (ICD10-I70.0). 4. Questionable gastric distension, suboptimally evaluated. Electronically Signed   By: Abigail Miyamoto M.D.   On: 08/28/2017 10:57     Medications:   . sodium chloride    . cefTRIAXone (ROCEPHIN)  IV 1 g (08/29/17 1129)  . dextrose 5%  lactated ringers Stopped (08/28/17 0748)  . doxycycline (VIBRAMYCIN) IV 100 mg (08/29/17 1132)  . norepinephrine (LEVOPHED) Adult infusion Stopped (08/27/17 0135)   . chlorhexidine  15 mL Mouth Rinse BID  . feeding supplement (NEPRO CARB STEADY)  237 mL Oral BID BM  . letrozole  2.5 mg Oral Daily  . mouth rinse  15 mL Mouth Rinse q12n4p  . metoprolol succinate  50 mg Oral Daily  . sodium chloride flush  10-40 mL Intracatheter Q12H   acetaminophen **OR** acetaminophen, bisacodyl, hydrALAZINE, HYDROcodone-acetaminophen, ipratropium-albuterol, [DISCONTINUED] ondansetron **OR** ondansetron (ZOFRAN) IV, sodium chloride flush  Assessment/ Plan:  72 y.o. female with diabetes, coronary disease, sleep apnea, history of breast cancer, was admitted on 08/22/2017 with fever, chills, tick bite.   1.  Acute renal failure 2.  Sepsis 3.  Urinary tract infection-Klebsiella, Proteus 4.  Elevated troponin/demand ischemia 5.  Hypotension 6.  Thrombocytopenia 7.  Hyponatremia, Hypokalemia 8.  Edema  Patient likely has acute renal failure from severe ATN.  She has developed anuria with worsening serum creatinine and BUN. She is appropriately being treated for sepsis and urinary tract infection. Off pressors  Electrolytes and volume status are acceptable but mental status may be affected by uremis. Discussed with Dr Jefferson Fuel and patient's husband. Will plan to start dialysis today. Short treatment, minimal UF as tolerated.  Trial of lasix again today Further need for HD will be assessed daily       LOS: [place] 6/16/201912:13 PM  [place], [place]  Note: This note was prepared with Dragon dictation. Any transcription errors are unintentional

## 2017-08-29 NOTE — Progress Notes (Signed)
This note also relates to the following rows which could not be included: Pulse Rate - Cannot attach notes to unvalidated device data Resp - Cannot attach notes to unvalidated device data BP - Cannot attach notes to unvalidated device data  Hd started  

## 2017-08-29 NOTE — Progress Notes (Signed)
Pt mental status has deteriorated over the coarse of the day-this am she was alert and oriented to self, +FC progressing to alert to voice only, does not FC.  Pt has remained in NSR-SB with bedside hemo in the lower 50s-BP labile sys 90s. BP/HR bounced back after hemo.  Pt has remained somnolent on CPAP majority of day-[facility]-off to [facility]. Lung sounds clear to auscultation, SpO2 > 90%.

## 2017-08-29 NOTE — Progress Notes (Signed)
Patient ID: Bonnie Holt, female   DOB: January 03, 1946, 72 y.o.   MRN: [id number] Pulmonary/critical care attending  Procedure note Dialysis catheter placement Consent is obtained Left internal jugular approach Tri Cath Used Complete contact barrior precautions utilized 1 mg of Ativan given total to assist with comfort during procedure Under ultrasound guidance the left internal jugular vein was identified Subdermal 1 percent lidocaine injected First pass resulted in successful cannulation of left internal jugular vein Using the Seldinger technique a guidewire was placed and the needle was removed Sequentially dilated to successfully pass the dialysis catheter. Guidewire removed intact, all 3 ports flushed easily dark nonpulsatile venous return stat portable chest x-ray ordered  While waiting for x-ray patient had episode of desaturation sit up place on nonrebreather now saturations 100%  Hermelinda Dellen, D.O.

## 2017-08-29 NOTE — Progress Notes (Signed)
Bonnie Holt NAME: Bonnie Holt    MR#:  [id number]  DATE OF BIRTH:  [birth year]  SUBJECTIVE:   Patient somewhat lethargic and encephalopathic today.  Seen by nephrology and remains anuric with creatinine trending up and therefore plan for starting hemodialysis today.  His husband is at bedside.  REVIEW OF SYSTEMS:   Review of Systems  Unable to perform ROS: Mental acuity   Tolerating Diet: No Tolerating PT: pending  DRUG ALLERGIES:   Allergies  Allergen Reactions  . Ace Inhibitors Cough    VITALS:  Blood pressure (!) 168/59, pulse 77, temperature (!) 96.7 F (35.9 C), temperature source Axillary, resp. rate 18, height 5\' 2"  (1.575 m), weight 108.3 kg (238 lb 12.1 oz), SpO2 96 %.  PHYSICAL EXAMINATION:   Physical Exam  GENERAL:  72 y.o.-year-old obese patient lying in the bed in no acute distress.  EYES: Pupils equal, round, reactive to light and accommodation. No scleral icterus. Extraocular muscles intact.  HEENT: Head atraumatic, normocephalic. Oropharynx and nasopharynx clear.  NECK:  Supple, no jugular venous distention. No thyroid enlargement, no tenderness.  LUNGS: Poor Resp. effort, no wheezing, rales, rhonchi. No use of accessory muscles of respiration.  CARDIOVASCULAR: S1, S2 normal. No murmurs, rubs, or gallops.  ABDOMEN: Soft, nontender, nondistended. Bowel sounds present. No organomegaly or mass.  EXTREMITIES: No cyanosis, clubbing, +2 edema from ankles to knees b/l.    NEUROLOGIC: grossly nonfocal moves all extremities spontaneously. Globally weak.  PSYCHIATRIC: AAO X 1.   SKIN: No obvious rash, lesion, or ulcer.   LABORATORY PANEL:  CBC Recent Labs  Lab 08/29/17 0455  WBC 8.3  HGB 10.7*  HCT 31.5*  PLT 63*    Chemistries  Recent Labs  Lab 08/28/17 0831 08/29/17 0455  NA 128* 129*  K 3.7 4.2  CL 100* 100*  CO2 14* 15*  GLUCOSE 253* 192*  BUN 81* 96*  CREATININE 9.31* 10.86*   CALCIUM 7.1* 7.6*  MG 2.5*  --   AST 134*  --   ALT 15  --   ALKPHOS 127*  --   BILITOT 0.7  --    Cardiac Enzymes Recent Labs  Lab 08/23/17 1315  TROPONINI 2.02*   RADIOLOGY:  US Abdomen Complete  Result Date: 08/28/2017 CLINICAL DATA:  Elevated liver function tests.  Acute renal failure. EXAM: ABDOMEN ULTRASOUND COMPLETE COMPARISON:  None. FINDINGS: Gallbladder: 1.6 cm gallstone within the neck. Nonmobile. No wall thickening or pericholecystic fluid. Sonographic Murphy's sign was not elicited. Common bile duct: Diameter: Normal, 4 mm. Liver: Moderately increased hepatic echogenicity. Presumed sparing adjacent to the gallbladder and possibly within the caudate lobe. Portal vein is patent on color Doppler imaging with normal direction of blood flow towards the liver. IVC: No abnormality visualized. Pancreas: Pancreatic tail partially obscured by bowel gas. Spleen: Size and appearance within normal limits. Right Kidney: Length: 12.9 cm. Echogenicity within normal limits. No mass or hydronephrosis visualized. Left Kidney: Length: 12.3 cm. Echogenicity within normal limits. No mass or hydronephrosis visualized. Abdominal aorta: Atherosclerotic irregularity.  No aneurysm. Other findings: No ascites. Possible gastric distension, including on image 104. Suboptimally evaluated. IMPRESSION: 1. Increased echogenicity throughout the liver, suggesting steatosis. Areas of presumed pericholecystic fat sparing and possible caudate lobe fat sparing. Consider ultrasound follow-up at 6 months or further characterization with nonemergent outpatient pre and post contrast abdominal MRI. 2. Cholelithiasis with a stone in the gallbladder neck, immobile. No evidence of acute cholecystitis or biliary  duct dilatation. 3.  Aortic Atherosclerosis (ICD10-I70.0). 4. Questionable gastric distension, suboptimally evaluated. Electronically Signed   By: Bonnie Holt M.D.   On: 08/28/2017 10:57   ASSESSMENT AND PLAN:   MARENDA Holt is a 72 y.o. female has a past medical history significant for DM, HTN, and CAD now with 3 day hx of fever, chills, and back apin. Now confused and lethargic. In ER, pt febrile and tachycardic. Lactic acid elevated c/w with sepsis. UA abnormal and CXR negative  1. clinical sepsis secondary to likely UTI/suspected RMSF or ehrlichiosis - Urine cultures are + for  Klebsiella pneumonia and Proteus -blood culture remain negative - Continue IV ceftriaxone, doxycycline.   -Appreciate infectious disease input.  Patient's [facility] spotted fever serologies negative.  Ehrlichiosis serologies have been sent but currently pending.  2. acute hypoxic respiratory failure secondary to pulmonary edema in the setting of IV fluid volume overload -patient was transferred to ICU step down for BiPAP. Feels a lot better and off Bipap now. - currently on nasal cannula doing well.  3 Acute Renal failure - likely due to ATN from sepsis.  - pt. Is anuric.  Cr. Up to 10 today. Pt. Showing signs of Uremia.  -Seen by nephrology in patient to initiate hemodialysis today as not improving.  4. Elevated troponin - demand ischemia in the setting of sepsis -patient denies any chest pain -cardiology consultation placed with dr [name] and no plan for any acute intervention.  - Meds on hold due to sepsis. Hypotension.  -echo shows EF 50-55% with no wall motional abnormalities.   5. Hx of Breast Cancer - cont. Femara  6. Thrombocytopenia - due to underlying sepsis.  - no acute bleeding and will transfuse platelets before getting dialysis cath today.   Discussed plan of care with pt's husband at bedside.   Case discussed with Care Management/Social Worker. Management plans discussed with the patient, family and they are in agreement.  CODE STATUS: full  DVT Prophylaxis: hepairn  TOTAL TIME TAKING CARE OF THIS PATIENT: 30 mins   POSSIBLE D/C unclear, DEPENDING ON CLINICAL CONDITION and course.  Note: This  dictation was prepared with Dragon dictation along with smaller phrase technology. Any transcriptional errors that result from this process are unintentional.  [name] M.D on 08/29/2017 at 11:58 AM  Between 7am to 6pm - Pager - [phone]  After 6pm go to [email or web address] - password [email or web address] [facility]  Office  [phone]  CC: Primary care physician; Kirk Ruths, MDPatient ID: [name], female   DOB: [birth year], 72 y.o.   MRN: [id number]

## 2017-08-29 NOTE — Progress Notes (Signed)
Abg results notified to [place] pho

## 2017-08-29 NOTE — Progress Notes (Addendum)
No problems overnight. Patient was on BiPAP this morning when I arrived, awake, alert, following commands, moving all extremities, taken off of BiPAP without any respiratory difficulties.  Vitals:   08/29/17 0223 08/29/17 0300 08/29/17 0400 08/29/17 0500  BP:  (!) 122/55 (!) 146/54 (!) 182/69  Pulse:  66 67 83  Resp:  15 14 20   Temp:      TempSrc:      SpO2:  98% 97% 100%  Weight: 238 lb 12.1 oz (108.3 kg)     Height:       Gen: NAD HEENT: NCAT, sclerae white Neck: JVP not visualized Lungs: Effort normal, no wheezes, left basilar crackles and bronchial breath sounds Cardiovascular: Regular, no M Abdomen: Soft, NT, NABS Ext: Symmetric pretibial and ankle edema Neuro: No focal deficits Skin: Facial flushing has resolved  BMP Latest Ref Rng & Units 08/29/2017 08/28/2017 08/27/2017  Glucose 65 - 99 mg/dL 192(H) 253(H) 173(H)  BUN 6 - 20 mg/dL 96(H) 81(H) 77(H)  Creatinine 0.44 - 1.00 mg/dL 10.86(H) 9.31(H) 8.93(H)  Sodium 135 - 145 mmol/L 129(L) 128(L) 128(L)  Potassium 3.5 - 5.1 mmol/L 4.2 3.7 3.7  Chloride 101 - 111 mmol/L 100(L) 100(L) 99(L)  CO2 22 - 32 mmol/L 15(L) 14(L) 15(L)  Calcium 8.9 - 10.3 mg/dL 7.6(L) 7.1(L) 7.0(L)   CBC Latest Ref Rng & Units 08/29/2017 08/28/2017 08/27/2017  WBC 3.6 - 11.0 K/uL 8.3 5.5 4.5  Hemoglobin 12.0 - 16.0 g/dL 10.7(L) 8.9(L) 9.9(L)  Hematocrit 35.0 - 47.0 % 31.5(L) 26.3(L) 28.7(L)  Platelets 150 - 440 K/uL 63(L) 54(L) 51(L)   CXR: Increased LLL airspace disease with probable small left effusion  IMP: Sepsis syndrome. Greatly appreciate Dr. Ola Spurr consult, clinical scenario consistent with Ehrlichosis versus [facility] spotted fever. Presently on doxycycline and Rocephin.  Acute renal failure.Decreased urinary output,BUN is 96, creatinine 10.86,discuss with nephrology, will institute hemodialysis. Secondary to thrombocytopenia and platelet dysfunction will transfuse platelets prior to placing dialysis catheter  Acute hypoxemic  respiratory failure. Resolving pulmonary edema with possible LLL pneumonia  Mildly elevated troponin I - likely demand ischemia  Type 2 diabetes. On scale coverage  Thrombocytopenia. Slowly improving  Acute encephalopathy, improving   Critical Care Time Kirkland, DO  08/29/2017 8:46 AM  Patient ID: Bonnie Holt, female   DOB: [birth year], 72 y.o.   MRN: [id number] Patient ID: [name], female   DOB: 1946/01/12, 72 y.o.   MRN: [id number] Patient ID: [name], female   DOB: March 05, 1946, 72 y.o.   MRN: [id number]

## 2017-08-30 DIAGNOSIS — Z515 Encounter for palliative care: Secondary | ICD-10-CM

## 2017-08-30 DIAGNOSIS — E1165 Type 2 diabetes mellitus with hyperglycemia: Secondary | ICD-10-CM

## 2017-08-30 DIAGNOSIS — N10 Acute tubulo-interstitial nephritis: Secondary | ICD-10-CM

## 2017-08-30 DIAGNOSIS — N179 Acute kidney failure, unspecified: Secondary | ICD-10-CM

## 2017-08-30 DIAGNOSIS — Z01818 Encounter for other preprocedural examination: Secondary | ICD-10-CM

## 2017-08-30 DIAGNOSIS — R7989 Other specified abnormal findings of blood chemistry: Secondary | ICD-10-CM

## 2017-08-30 DIAGNOSIS — R945 Abnormal results of liver function studies: Secondary | ICD-10-CM

## 2017-08-30 LAB — BASIC METABOLIC PANEL
ANION GAP: 16 — AB (ref 5–15)
BUN: 93 mg/dL — ABNORMAL HIGH (ref 6–20)
CO2: 20 mmol/L — AB (ref 22–32)
Calcium: 7.4 mg/dL — ABNORMAL LOW (ref 8.9–10.3)
Chloride: 103 mmol/L (ref 101–111)
Creatinine, Ser: 10.2 mg/dL — ABNORMAL HIGH (ref 0.44–1.00)
GFR calc non Af Amer: 3 mL/min — ABNORMAL LOW (ref >60)
GFR, EST AFRICAN AMERICAN: 4 mL/min — AB (ref >60)
Glucose, Bld: 215 mg/dL — ABNORMAL HIGH (ref 65–99)
Potassium: 3.8 mmol/L (ref 3.5–5.1)
Sodium: 139 mmol/L (ref 135–145)

## 2017-08-30 LAB — PREPARE PLATELET PHERESIS: Unit division: 0

## 2017-08-30 LAB — BPAM PLATELET PHERESIS
Blood Product Expiration Date: 201906172359
ISSUE DATE / TIME: 201906161217
Unit Type and Rh: 8400

## 2017-08-30 LAB — GLUCOSE, CAPILLARY
GLUCOSE-CAPILLARY: 220 mg/dL — AB (ref 65–99)
GLUCOSE-CAPILLARY: 217 mg/dL — AB (ref 65–99)
GLUCOSE-CAPILLARY: 197 mg/dL — AB (ref 65–99)
Glucose-Capillary: 202 mg/dL — ABNORMAL HIGH (ref 65–99)
Glucose-Capillary: 161 mg/dL — ABNORMAL HIGH (ref 65–99)
Glucose-Capillary: 216 mg/dL — ABNORMAL HIGH (ref 65–99)

## 2017-08-30 LAB — TROPONIN I: Troponin I: 0.12 ng/mL (ref <0.03)

## 2017-08-30 LAB — MISC LABCORP TEST (SEND OUT): LabCorp test name: 164772

## 2017-08-30 LAB — PROCALCITONIN: Procalcitonin: 3.68 ng/mL

## 2017-08-30 LAB — PHOSPHORUS: PHOSPHORUS: 8.2 mg/dL — AB (ref 2.5–4.6)

## 2017-08-30 MED ORDER — METOPROLOL TARTRATE 5 MG/5ML IV SOLN: 2.5000 mg | INTRAVENOUS | Status: DC | PRN

## 2017-08-30 MED ORDER — FUROSEMIDE 10 MG/ML IJ SOLN
100.0000 mg | Freq: Once | INTRAVENOUS | Status: AC
  Administered 2017-08-30: 100 mg via INTRAVENOUS
  Filled 2017-08-30: qty 10
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
## 2017-08-30 NOTE — Progress Notes (Signed)
[facility] [facility] [place] NAME: Bonnie Holt    MR#:  [id number]  DATE OF BIRTH:  1945/10/14  SUBJECTIVE:   Status significantly worse since yesterday and patient is quite encephalopathic and currently on BiPAP.  Currently on mixed metabolic/respiratory acidosis.  Hemodialysis has been initiated and patient currently is getting dialysis.  REVIEW OF SYSTEMS:   Review of Systems  Unable to perform ROS: Mental acuity   Tolerating Diet: No Tolerating PT: pending  DRUG ALLERGIES:   Allergies  Allergen Reactions  . Ace Inhibitors Cough    VITALS:  Blood pressure (!) 166/64, pulse 95, temperature (!) 96.4 F (35.8 C), resp. rate (!) 22, height 5\' 2"  (1.575 m), weight 110 kg (242 lb 8.1 oz), SpO2 93 %.  PHYSICAL EXAMINATION:   Physical Exam  GENERAL:  72 y.o.-year-old obese patient lying in the bed encephalopathic and currently on BiPAP EYES: Pupils equal, round, reactive to light. No scleral icterus. Extraocular muscles intact.  HEENT: Head atraumatic, normocephalic. Oropharynx and nasopharynx clear.  NECK:  Supple, no jugular venous distention. No thyroid enlargement, no tenderness.  LUNGS: Poor Resp. effort, no wheezing, rales, rhonchi. No use of accessory muscles of respiration.  CARDIOVASCULAR: S1, S2 normal. No murmurs, rubs, or gallops.  ABDOMEN: Soft, nontender, nondistended. Bowel sounds present. No organomegaly or mass.  EXTREMITIES: No cyanosis, clubbing, +2 edema from ankles to knees b/l.    NEUROLOGIC: Lethargic/encephalopathic but moves all extremities spontaneously. PSYCHIATRIC: Lethargic/Enceophalpathic.    SKIN: No obvious rash, lesion, or ulcer.   LABORATORY PANEL:  CBC Recent Labs  Lab 08/29/17 2104  WBC 8.2  HGB 10.4*  HCT 30.3*  PLT 78*    Chemistries  Recent Labs  Lab 08/29/17 2104 08/30/17 0652  NA 133* 139  K 4.3 3.8  CL 101 103  CO2 15* 20*  GLUCOSE 213* 215*  BUN 91* 93*  CREATININE  10.07* 10.20*  CALCIUM 7.7* 7.4*  MG 2.7*  --   AST 69*  --   ALT 12*  --   ALKPHOS 134*  --   BILITOT 0.6  --    Cardiac Enzymes Recent Labs  Lab 08/30/17 0652  TROPONINI 0.12*   RADIOLOGY:  Dg Chest Port 1 View  Result Date: 08/29/2017 CLINICAL DATA:  Central line placement EXAM: PORTABLE CHEST 1 VIEW COMPARISON:  08/26/2017 FINDINGS: Right-sided PICC line is difficult to visualize centrally but likely similar. A left-sided internal jugular catheter terminates at the mid to low SVC. Midline trachea. Mild cardiomegaly. Atherosclerosis in the transverse aorta. Small bilateral pleural effusions, similar on the left and new or increased on the right. No pneumothorax. Moderate interstitial edema, increased. Left greater than right base airspace disease, similar. IMPRESSION: Left-sided central line with tip at mid to low SVC; no pneumothorax. Worsened aeration, with progressive interstitial edema and new or enlarging right pleural effusion. A small left pleural effusion is similar. Bibasilar airspace disease which is most likely atelectasis. Concurrent infection or aspiration cannot be excluded. Electronically Signed   By: Abigail Miyamoto M.D.   On: 08/29/2017 14:28   ASSESSMENT AND PLAN:   Bonnie Holt is a 72 y.o. female has a past medical history significant for DM, HTN, and CAD now with 3 day hx of fever, chills, and back apin. Now confused and lethargic. In ER, pt febrile and tachycardic. Lactic acid elevated c/w with sepsis. UA abnormal and CXR negative  1.  Sepsis secondary to likely UTI/suspected RMSF/ehrlichiosis - -Appreciate infectious  disease input.  Patient's [facility] spotted fever serologies negative.  Ehrlichiosis serologies have been sent but currently pending. - Urine cultures are + for  Klebsiella pneumonia and Proteus - blood culture is negative - Continue IV ceftriaxone, doxycycline.    2. acute hypoxic respiratory failure secondary to pulmonary edema in the setting of  IV fluid volume overload And what volume overloaded not encephalopathic and therefore placed back on BiPAP. -Continue weaning as per intensivist/pulmonary.  3 Acute Renal failure - likely due to ATN from sepsis.  - pt. Is anuric.  Cr. Up to 10 -Status post try dialysis catheter placement yesterday and patient has been initiated on hemodialysis.  She could not tolerate dialysis yesterday due to bradycardia and some relative hypotension. -Continue further care as per nephrology.  4. Elevated troponin - demand ischemia in the setting of sepsis -patient denies any chest pain -cardiology consultation placed with dr [name] and no plan for any acute intervention.  - Meds on hold due to sepsis. Hypotension.  -echo shows EF 50-55% with no wall motional abnormalities.   5. Hx of Breast Cancer - cont. Femara  6.  Thrombocytopenia-secondary to underlying sepsis.  Patient was transfused yesterday prior to getting the dialysis catheter.  Lately count is improved posttransfusion.  7.  Mixed metabolic/respiratory acidosis-secondary to underlying renal failure combined with pulmonary edema. - Continue supportive care with BiPAP, patient getting dialysis and also started on a bicarbonate drip.  She is prognosis is poor given her multiorgan issues.  Consider palliative care consult to discuss goals of care.  Case discussed with Care Management/Social Worker. Management plans discussed with the patient, family and they are in agreement.  CODE STATUS: full  DVT Prophylaxis: hepairn  TOTAL TIME TAKING CARE OF THIS PATIENT: 30 mins   POSSIBLE D/C unclear, DEPENDING ON CLINICAL CONDITION and course.  Note: This dictation was prepared with Dragon dictation along with smaller phrase technology. Any transcriptional errors that result from this process are unintentional.  Bonnie Holt M.D on 08/30/2017 at 3:13 PM  Between 7am to 6pm - Pager - [phone]  After 6pm go to [email or web address] - password [email or web address]  [facility]  Office  [phone]  CC: Primary care physician; Kirk Ruths, MDPatient ID: Bonnie Holt, female   DOB: 1946-02-16, 73 y.o.   MRN: [id number]

## 2017-08-30 NOTE — Progress Notes (Signed)
HD tx end    08/30/17 1240  Vital Signs  Temp (!) 95.5 F (35.3 C)  Temp Source Core  Pulse Rate 92  Pulse Rate Source Monitor  Resp (!) 21  BP (!) 158/62  BP Location Right Arm  BP Method Automatic  Patient Position (if appropriate) Lying  Oxygen Therapy  SpO2 97 %  O2 Device Bi-PAP  FiO2 (%) 45 %  During Hemodialysis Assessment  Dialysis Fluid Bolus Normal Saline  Bolus Amount (mL) 250 mL  Intra-Hemodialysis Comments Tx completed

## 2017-08-30 NOTE — Progress Notes (Signed)
Post HD assessment    08/30/17 1244  Neurological  Level of Consciousness Responds to Voice  Orientation Level Disoriented X4  Respiratory  Respiratory Pattern Regular;Unlabored  Chest Assessment Chest expansion symmetrical  Cardiac  ECG Monitor Yes  Vascular  R Radial Pulse +2  L Radial Pulse +2  Edema Generalized;Right upper extremity;Left upper extremity;Right lower extremity;Left lower extremity  Integumentary  Integumentary (WDL) X  Skin Color Appropriate for ethnicity;Red  Musculoskeletal  Musculoskeletal (WDL) X  Generalized Weakness Yes  Assistive Device BSC  GU Assessment  Genitourinary (WDL) X  Genitourinary Symptoms  (HD)  Psychosocial  Psychosocial (WDL) X  Patient Behaviors Not interactive

## 2017-08-30 NOTE — Progress Notes (Signed)
SLP Cancellation Note  Patient Details Name: CHELSIE BUREL MRN: [id number] DOB: May 18, 1945   Cancelled treatment:       Reason Eval/Treat Not Completed: Medical issues which prohibited therapy;Fatigue/lethargy limiting ability to participate(chart reviewed; consulted NSG re: pt's status today). NSG reported pt was more lethargic requiring increased O2 support (CPAP) and cues since Sunday. MD's note indicated pt was encephalopathic w/ mental status waxing and waning. NSG did not recommend any po trials at this time d/t status and risk for aspiration - she was holding po meds. HD tx to begin soon. Pt is NPO. ST services will f/u tomorrow w/ BSE if pt's medical status is appropriate for po trials. Recommend frequent oral care as tolerates; aspiration precautions. NSG agreed.     Orinda Kenner, MS, CCC-SLP Bijal Siglin 08/30/2017, 11:06 AM

## 2017-08-30 NOTE — Progress Notes (Signed)
[facility] INFECTIOUS DISEASE PROGRESS NOTE Date of Admission:  08/22/2017     ID: DESSIRE GRIMES is a 72 y.o. female with Fevers, AMS  Principal Problem:   Sepsis ([place]) Active Problems:   UTI (urinary tract infection)   CAD (coronary artery disease)   Diabetes ([place])   ARF (acute renal failure) ([facility])   Elevated liver function tests   Palliative care encounter   Subjective: Progressive decline in MS over weekend, Started HD yesterday. No fevers, now hypothermic. Was on bipap, not on pressors- BP high - getting hydralazine for this.  Husband and sister in law at bedside  ROS  Unable to obtain  Medications:  Antibiotics Given (last 72 hours)    Date/Time Action Medication Dose Rate   08/27/17 2340 New Bag/Given   doxycycline (VIBRAMYCIN) 100 mg in sodium chloride 0.9 % 250 mL IVPB 100 mg 125 mL/hr   08/28/17 1043 New Bag/Given   cefTRIAXone (ROCEPHIN) 1 g in sodium chloride 0.9 % 100 mL IVPB 1 g 200 mL/hr   08/28/17 1043 New Bag/Given   doxycycline (VIBRAMYCIN) 100 mg in sodium chloride 0.9 % 250 mL IVPB 100 mg 125 mL/hr   08/28/17 2241 New Bag/Given   doxycycline (VIBRAMYCIN) 100 mg in sodium chloride 0.9 % 250 mL IVPB 100 mg 125 mL/hr   08/29/17 1129 New Bag/Given   cefTRIAXone (ROCEPHIN) 1 g in sodium chloride 0.9 % 100 mL IVPB 1 g 200 mL/hr   08/29/17 1132 New Bag/Given   doxycycline (VIBRAMYCIN) 100 mg in sodium chloride 0.9 % 250 mL IVPB 100 mg 125 mL/hr   08/29/17 2232 New Bag/Given   doxycycline (VIBRAMYCIN) 100 mg in sodium chloride 0.9 % 250 mL IVPB 100 mg 125 mL/hr   08/30/17 1151 New Bag/Given   cefTRIAXone (ROCEPHIN) 1 g in sodium chloride 0.9 % 100 mL IVPB 1 g 200 mL/hr   08/30/17 1239 New Bag/Given   doxycycline (VIBRAMYCIN) 100 mg in sodium chloride 0.9 % 250 mL IVPB 100 mg 125 mL/hr     . chlorhexidine  15 mL Mouth Rinse BID  . Chlorhexidine Gluconate Cloth  6 each Topical Q0600  . feeding supplement (NEPRO CARB STEADY)  237 mL Oral BID BM  . insulin  aspart  0-15 Units Subcutaneous Q4H  . letrozole  2.5 mg Oral Daily  . mouth rinse  15 mL Mouth Rinse q12n4p  . sodium chloride flush  10-40 mL Intracatheter Q12H    Objective: Vital signs in last 24 hours: Temp:  [94 F (34.4 C)-97 F (36.1 C)] 96.8 F (36 C) (06/17 1500) Pulse Rate:  [51-102] 102 (06/17 1500) Resp:  [14-27] 18 (06/17 1500) BP: (88-194)/(46-69) 176/61 (06/17 1528) SpO2:  [93 %-100 %] 93 % (06/17 1500) FiO2 (%):  [45 %] 45 % (06/17 1247) Weight:  [106.8 kg (235 lb 7.2 oz)-110.4 kg (243 lb 6.2 oz)] 110 kg (242 lb 8.1 oz) (06/17 1247) Constitutional: confused, able to arouse HENT: [place]/AT, PERRLA, no scleral icterus Mouth/Throat: Oropharynx is clear and dry. No oropharyngeal exudate.  Cardiovascular: Normal rate, regular rhythm and normal heart sounds.  Pulmonary/Chest: Effort normal and breath sounds normal. No respiratory distress.  has no wheezes.  Neck = supple, no nuchal rigidity Abdominal: Soft. Bowel sounds are normal.  exhibits no distension. There is no tenderness.  Lymphadenopathy: no cervical adenopathy. No axillary adenopathy Neurological: confused, asteresix Skin: Skin is warm and dry. No rash noted. No erythema.  Psychiatric: confused  Lab Results Recent Labs    08/29/17  4098 08/29/17 2104 08/30/17 0652  WBC 8.3 8.2  --   HGB 10.7* 10.4*  --   HCT 31.5* 30.3*  --   NA 129* 133* 139  K 4.2 4.3 3.8  CL 100* 101 103  CO2 15* 15* 20*  BUN 96* 91* 93*  CREATININE 10.86* 10.07* 10.20*    Microbiology: Results for orders placed or performed during the hospital encounter of 08/22/17  Blood Culture (routine x 2)     Status: None   Collection Time: 08/22/17 12:52 PM  Result Value Ref Range Status   Specimen Description BLOOD RIGHT HAND  Final   Special Requests   Final    BOTTLES DRAWN AEROBIC AND ANAEROBIC Blood Culture adequate volume   Culture   Final    NO GROWTH 5 DAYS Performed at [facility], [address] [place],  [place] [address]    Report Status 08/27/2017 FINAL  Final  Urine culture     Status: Abnormal   Collection Time: 08/22/17 12:52 PM  Result Value Ref Range Status   Specimen Description   Final    URINE, RANDOM Performed at [facility], [address], [place], [place] [address]    Special Requests   Final    NONE Performed at [facility], [address], [place], [place] [address]    Culture (A)  Final    >=100,000 COLONIES/mL KLEBSIELLA PNEUMONIAE 60,000 COLONIES/mL PROTEUS MIRABILIS    Report Status 08/24/2017 FINAL  Final   Organism ID, Bacteria KLEBSIELLA PNEUMONIAE (A)  Final   Organism ID, Bacteria PROTEUS MIRABILIS (A)  Final      Susceptibility   Klebsiella pneumoniae - MIC*    AMPICILLIN RESISTANT Resistant     CEFAZOLIN <=4 SENSITIVE Sensitive     CEFTRIAXONE <=1 SENSITIVE Sensitive     CIPROFLOXACIN <=0.25 SENSITIVE Sensitive     GENTAMICIN <=1 SENSITIVE Sensitive     IMIPENEM <=0.25 SENSITIVE Sensitive     NITROFURANTOIN 64 INTERMEDIATE Intermediate     TRIMETH/SULFA <=20 SENSITIVE Sensitive     AMPICILLIN/SULBACTAM 4 SENSITIVE Sensitive     PIP/TAZO <=4 SENSITIVE Sensitive     Extended ESBL NEGATIVE Sensitive     * >=100,000 COLONIES/mL KLEBSIELLA PNEUMONIAE   Proteus mirabilis - MIC*    AMPICILLIN <=2 SENSITIVE Sensitive     CEFAZOLIN <=4 SENSITIVE Sensitive     CEFTRIAXONE <=1 SENSITIVE Sensitive     CIPROFLOXACIN <=0.25 SENSITIVE Sensitive     GENTAMICIN <=1 SENSITIVE Sensitive     IMIPENEM 1 SENSITIVE Sensitive     NITROFURANTOIN 128 RESISTANT Resistant     TRIMETH/SULFA <=20 SENSITIVE Sensitive     AMPICILLIN/SULBACTAM <=2 SENSITIVE Sensitive     PIP/TAZO <=4 SENSITIVE Sensitive     * 60,000 COLONIES/mL PROTEUS MIRABILIS  Blood Culture (routine x 2)     Status: None   Collection Time: 08/22/17 12:53 PM  Result Value Ref Range Status   Specimen Description BLOOD RIGHT ARM  Final   Special Requests   Final    BOTTLES DRAWN AEROBIC AND  ANAEROBIC Blood Culture results may not be optimal due to an excessive volume of blood received in culture bottles   Culture   Final    NO GROWTH 5 DAYS Performed at [facility], [address], [place], [place] [address]    Report Status 08/27/2017 FINAL  Final  MRSA PCR Screening     Status: None   Collection Time: 08/22/17  9:18 PM  Result  Value Ref Range Status   MRSA by PCR NEGATIVE NEGATIVE Final    Comment:        The GeneXpert MRSA Assay (FDA approved for NASAL specimens only), is one component of a comprehensive MRSA colonization surveillance program. It is not intended to diagnose MRSA infection nor to guide or monitor treatment for MRSA infections. Performed at [facility], [address] [place], [place] [address]   Culture, blood (Routine X 2) w Reflex to ID Panel     Status: None (Preliminary result)   Collection Time: 08/29/17  9:31 PM  Result Value Ref Range Status   Specimen Description BLOOD LEFT FOOT  Final   Special Requests   Final    BOTTLES DRAWN AEROBIC AND ANAEROBIC Blood Culture adequate volume   Culture   Final    NO GROWTH < 12 HOURS Performed at [facility], [address], [place], [place] [address]    Report Status PENDING  Incomplete  Culture, blood (Routine X 2) w Reflex to ID Panel     Status: None (Preliminary result)   Collection Time: 08/29/17  9:50 PM  Result Value Ref Range Status   Specimen Description BLOOD RIGHT ANKLE  Final   Special Requests   Final    BOTTLES DRAWN AEROBIC AND ANAEROBIC Blood Culture adequate volume   Culture   Final    NO GROWTH < 12 HOURS Performed at [facility], [address], [place], [place] [address]    Report Status PENDING  Incomplete     Studies/Results: Dg Chest Port 1 View  Result Date: 08/29/2017 CLINICAL DATA:  Central line placement EXAM: PORTABLE CHEST 1 VIEW COMPARISON:  08/26/2017 FINDINGS: Right-sided PICC line is difficult to visualize  centrally but likely similar. A left-sided internal jugular catheter terminates at the mid to low SVC. Midline trachea. Mild cardiomegaly. Atherosclerosis in the transverse aorta. Small bilateral pleural effusions, similar on the left and new or increased on the right. No pneumothorax. Moderate interstitial edema, increased. Left greater than right base airspace disease, similar. IMPRESSION: Left-sided central line with tip at mid to low SVC; no pneumothorax. Worsened aeration, with progressive interstitial edema and new or enlarging right pleural effusion. A small left pleural effusion is similar. Bibasilar airspace disease which is most likely atelectasis. Concurrent infection or aspiration cannot be excluded. Electronically Signed   By: Abigail Miyamoto M.D.   On: 08/29/2017 14:28    Assessment/Plan: ZAIRE VANBUSKIRK is a 72 y.o. female admitted with  fevers, chills, unsteady gait x 3 days.  Had recent tick bite. Admitted with sepsis and started IV abx. Developed progressive SOB and transferred to ICU.  She has had recurrent fevers 6/9-6/12 but now deferevesicing. On admit wbc was 3.5, plts 55, UA 0-5 wbc. cr elevated and had continued to increase.  CXR was negative on admit but then developed pulm edema, CT head neg. Echo with ef 50-55 but poor study.  I suspect she has Ehrlichia or RMSF.   Renal failure is out of proportion but likely multifactorial.  6/17- Much more altered. RUQ US showed fatty liver and stone in gb neck but no cholecystitis.  AST ALT improving. Alk phos went up a little. Started HD.  Doxy day 6, day 6 of ctx as well. I think her confusion is likely from uremia.   Recommendation Cont doxy and ceftriaxone as other cxs negative.  I have sent serology for ehrlichia but likely will be negative this early in infection. Cont  supportive care Consider neurology consult.  Thank you very much for the consult. Will follow with you.  Leonel Ramsay   08/30/2017, 3:56 PM

## 2017-08-30 NOTE — Progress Notes (Signed)
   08/30/17 1015  Clinical Encounter Type  Visited With Family  Visit Type Initial  Referral From Nurse;Palliative care team  Consult/Referral To Chaplain  Spiritual Encounters  Spiritual Needs Emotional;Grief support;Prayer  Stress Factors  Family Stress Factors Health changes   [place] checked in with nursing staff, patient was receiving dialysis.  [place] found pt's husband, [name], and other family outside the ICU waiting. [name] asked to speak to me privately. [name] shared receiving hard news about pt's heart damage. Expressed concern of not knowing there was heart damage until now. He expressed taking the news hard and not sure what to say. [name] was teary. Mentioned being married for 53 years and having no children, only having one another. We talked about how turning to God and prayer are the only things we can do in these situations. [name] thanked me for coming to talk with him. I told him to ask the nurse to page the on-call chaplain if he needed anything else.  I will try to follow up later today.

## 2017-08-30 NOTE — Progress Notes (Signed)
Pre HD assessment    08/30/17 1001  Neurological  Level of Consciousness Responds to Pain  Orientation Level Disoriented X4  Respiratory  Respiratory Pattern Regular;Unlabored  Chest Assessment Chest expansion symmetrical  Cardiac  ECG Monitor Yes  Vascular  R Radial Pulse +2  L Radial Pulse +2  Edema Generalized;Right upper extremity;Left upper extremity;Right lower extremity;Left lower extremity  Integumentary  Integumentary (WDL) X  Skin Color Appropriate for ethnicity;Red  Musculoskeletal  Musculoskeletal (WDL) X  Generalized Weakness Yes  Assistive Device BSC  GU Assessment  Genitourinary (WDL) X  Genitourinary Symptoms  (HD)  Psychosocial  Psychosocial (WDL) X  Patient Behaviors Not interactive

## 2017-08-30 NOTE — Progress Notes (Signed)
Post HD assessment. Pt tolerated tx well without complication. Net UF 1009, goal met.    08/30/17 1247  Vital Signs  Temp (!) 95.5 F (35.3 C)  Temp Source Core  Pulse Rate 82  Pulse Rate Source Monitor  Resp (!) 24  BP (!) 166/46  BP Location Right Leg  BP Method Automatic  Patient Position (if appropriate) Lying  Oxygen Therapy  SpO2 97 %  O2 Device Bi-PAP  FiO2 (%) 45 %  Dialysis Weight  Weight 110 kg (242 lb 8.1 oz)  Type of Weight Post-Dialysis  Post-Hemodialysis Assessment  Rinseback Volume (mL) 250 mL  KECN 36.6 V  Dialyzer Clearance Lightly streaked  Duration of HD Treatment -hour(s) 2.5 hour(s)  Hemodialysis Intake (mL) 500 mL  UF Total -Machine (mL) 1509 mL  Net UF (mL) 1009 mL  Tolerated HD Treatment Yes  Education / Care Plan  Dialysis Education Provided No (Comment) (pt non-interactive )  Hemodialysis Catheter Left Internal jugular Triple-lumen  Placement Date/Time: 08/29/17 1400   Time Out: Correct patient;Correct site;Correct procedure  Maximum sterile barrier precautions: Hand hygiene;Sterile gloves;Cap;Large sterile sheet;Mask;Sterile gown  Site Prep: Chlorhexidine  Local Anesthetic: Inje...  Site Condition No complications  Blue Lumen Status Heparin locked  Red Lumen Status Heparin locked  Catheter fill solution Heparin 1000 units/ml  Catheter fill volume (Arterial) 1.4 cc  Catheter fill volume (Venous) 1.4  Dressing Type Biopatch  Dressing Status Clean;Dry;Intact  Drainage Description None  Post treatment catheter status Capped and Clamped

## 2017-08-30 NOTE — Progress Notes (Signed)
Pre HD assessment   08/30/17 0945  Vital Signs  Temp (!) 95.9 F (35.5 C)  Pulse Rate 92  Pulse Rate Source Monitor  Resp 20  BP (!) 156/58  BP Location Right Leg  BP Method Automatic  Patient Position (if appropriate) Lying  Oxygen Therapy  SpO2 97 %  O2 Device Bi-PAP  FiO2 (%) 45 %  Critical Care Pain Observation Tool (CPOT)  Facial Expression 0  Body Movements 0  Muscle Tension 0  Compliance with ventilator (intubated pts.) N/A  Vocalization (extubated pts.) 0  CPOT Total 0  Dialysis Weight  Weight 110.4 kg (243 lb 6.2 oz)  Type of Weight Pre-Dialysis  Time-Out for Hemodialysis  What Procedure? HD  Pt Identifiers(min of two) First/Last Name;MRN/Account#  Correct Site? Yes  Correct Side? Yes  Correct Procedure? Yes  Consents Verified? Yes  Rad Studies Available? N/A  Safety Precautions Reviewed? Yes  Engineer, civil (consulting) Number  (7A)  Station Number  (bedside,  ICU 20)  UF/Alarm Test Passed  Conductivity: Meter 13.8  Conductivity: Machine  14.1  pH 7.6  Reverse Osmosis 5705  Normal Saline Lot Number [id number]  Dialyzer Lot Number [id number]  Disposable Set Lot Number [id number]  Machine Temperature 98.6 F (37 C)  Musician and Audible Yes  Blood Lines Intact and Secured Yes  Pre Treatment Patient Checks  Vascular access used during treatment Catheter  Hepatitis B Surface Antigen Results  (unk, labs drawn 08/29/2017)  Hepatitis B Surface Antibody  (unk)  Date Hepatitis B Surface Antibody Drawn 08/29/17  Hemodialysis Consent Verified Yes  Hemodialysis Standing Orders Initiated Yes  ECG (Telemetry) Monitor On Yes  Prime Ordered Normal Saline  Length of  DialysisTreatment -hour(s) 2.5 Hour(s)  Dialyzer Elisio 17H NR  Dialysate 2K, 2.5 Ca  Dialysis Anticoagulant None  Dialysate Flow Ordered 500  Blood Flow Rate Ordered 250 mL/min  Ultrafiltration Goal 0.5 Liters  Pre Treatment Labs Phosphorus  Dialysis Blood Pressure Support Ordered Normal Saline   Education / Care Plan  Dialysis Education Provided Yes (family )  Documented Education in Care Plan Yes  Hemodialysis Catheter Left Internal jugular Triple-lumen  Placement Date/Time: 08/29/17 1400   Time Out: Correct patient;Correct site;Correct procedure  Maximum sterile barrier precautions: Hand hygiene;Sterile gloves;Cap;Large sterile sheet;Mask;Sterile gown  Site Prep: Chlorhexidine  Local Anesthetic: Inje...  Site Condition No complications  Blue Lumen Status Heparin locked  Red Lumen Status Heparin locked  Dressing Type Biopatch  Dressing Status Clean;Dry;Intact  Drainage Description None

## 2017-08-30 NOTE — Progress Notes (Signed)
[facility], [place] 08/30/17  Subjective:  Patient remains critically ill at this point in time. Urine output remains in the oliguric range and was only 263 cc over the preceding 24 hours. Urine and creatinine remain critically high at 93 and 10.2 respectively.   Objective:  Vital signs in last 24 hours:  Temp:  [94 F (34.4 C)-98.1 F (36.7 C)] 95.9 F (35.5 C) (06/17 1045) Pulse Rate:  [51-96] 85 (06/17 1045) Resp:  [14-25] 23 (06/17 1045) BP: (88-194)/(46-84) 191/55 (06/17 1045) SpO2:  [93 %-100 %] 97 % (06/17 1045) FiO2 (%):  [45 %] 45 % (06/17 1045) Weight:  [106.8 kg (235 lb 7.2 oz)-110.4 kg (243 lb 6.2 oz)] 110.4 kg (243 lb 6.2 oz) (06/17 0945)  Weight change: -1.5 kg (-3 lb 4.9 oz) Filed Weights   08/29/17 0223 08/30/17 0500 08/30/17 0945  Weight: 108.3 kg (238 lb 12.1 oz) 106.8 kg (235 lb 7.2 oz) 110.4 kg (243 lb 6.2 oz)    Intake/Output:    Intake/Output Summary (Last 24 hours) at 08/30/2017 1055 Last data filed at 08/30/2017 1048 Gross per 24 hour  Intake 1642.67 ml  Output 138 ml  Net 1504.67 ml   Physical Exam: General:  Critically ill-appearing, laying in the bed  HEENT  anicteric  Neck:  Supple  Lungs:  Scattered rhonchi, on BiPAP  Heart::  irregular  Abdomen:  Soft, nontender  Extremities:  2+ lower extremity edema  Neurologic:  Lethargic not following commands   Skin:  No acute rashes    Basic Metabolic Panel:  Recent Labs  Lab 08/27/17 1700 08/28/17 0831 08/29/17 0455 08/29/17 2104 08/30/17 0652 08/30/17 1019  NA 128* 128* 129* 133* 139  --   K 3.7 3.7 4.2 4.3 3.8  --   CL 99* 100* 100* 101 103  --   CO2 15* 14* 15* 15* 20*  --   GLUCOSE 173* 253* 192* 213* 215*  --   BUN 77* 81* 96* 91* 93*  --   CREATININE 8.93* 9.31* 10.86* 10.07* 10.20*  --   CALCIUM 7.0* 7.1* 7.6* 7.7* 7.4*  --   MG  --  2.5*  --  2.7*  --   --   PHOS  --  6.2*  --  7.7*  --  8.2*     CBC: Recent Labs  Lab 08/26/17 0501  08/27/17 0441 08/28/17 0734 08/29/17 0455 08/29/17 2104  WBC 3.9 4.5 5.5 8.3 8.2  HGB 9.4* 9.9* 8.9* 10.7* 10.4*  HCT 27.1* 28.7* 26.3* 31.5* 30.3*  MCV 91.6 91.9 93.2 93.0 92.2  PLT 44* 51* 54* 63* 78*     No results found for: HEPBSAG, HEPBSAB, HEPBIGM    Microbiology:  Recent Results (from the past 240 hour(s))  Blood Culture (routine x 2)     Status: None   Collection Time: 08/22/17 12:52 PM  Result Value Ref Range Status   Specimen Description BLOOD RIGHT HAND  Final   Special Requests   Final    BOTTLES DRAWN AEROBIC AND ANAEROBIC Blood Culture adequate volume   Culture   Final    NO GROWTH 5 DAYS Performed at [facility], [address] [place],  [address]    Report Status 08/27/2017 FINAL  Final  Urine culture     Status: Abnormal   Collection Time: 08/22/17 12:52 PM  Result Value Ref Range Status   Specimen Description   Final    URINE, RANDOM Performed at [facility],  [place], [place] [address]    Special Requests   Final    NONE Performed at [facility], [address] [place], [place] [address]    Culture (A)  Final    >=100,000 COLONIES/mL KLEBSIELLA PNEUMONIAE 60,000 COLONIES/mL PROTEUS MIRABILIS    Report Status 08/24/2017 FINAL  Final   Organism ID, Bacteria KLEBSIELLA PNEUMONIAE (A)  Final   Organism ID, Bacteria PROTEUS MIRABILIS (A)  Final      Susceptibility   Klebsiella pneumoniae - MIC*    AMPICILLIN RESISTANT Resistant     CEFAZOLIN <=4 SENSITIVE Sensitive     CEFTRIAXONE <=1 SENSITIVE Sensitive     CIPROFLOXACIN <=0.25 SENSITIVE Sensitive     GENTAMICIN <=1 SENSITIVE Sensitive     IMIPENEM <=0.25 SENSITIVE Sensitive     NITROFURANTOIN 64 INTERMEDIATE Intermediate     TRIMETH/SULFA <=20 SENSITIVE Sensitive     AMPICILLIN/SULBACTAM 4 SENSITIVE Sensitive     PIP/TAZO <=4 SENSITIVE Sensitive     Extended ESBL NEGATIVE Sensitive     * >=100,000 COLONIES/mL KLEBSIELLA PNEUMONIAE    Proteus mirabilis - MIC*    AMPICILLIN <=2 SENSITIVE Sensitive     CEFAZOLIN <=4 SENSITIVE Sensitive     CEFTRIAXONE <=1 SENSITIVE Sensitive     CIPROFLOXACIN <=0.25 SENSITIVE Sensitive     GENTAMICIN <=1 SENSITIVE Sensitive     IMIPENEM 1 SENSITIVE Sensitive     NITROFURANTOIN 128 RESISTANT Resistant     TRIMETH/SULFA <=20 SENSITIVE Sensitive     AMPICILLIN/SULBACTAM <=2 SENSITIVE Sensitive     PIP/TAZO <=4 SENSITIVE Sensitive     * 60,000 COLONIES/mL PROTEUS MIRABILIS  Blood Culture (routine x 2)     Status: None   Collection Time: 08/22/17 12:53 PM  Result Value Ref Range Status   Specimen Description BLOOD RIGHT ARM  Final   Special Requests   Final    BOTTLES DRAWN AEROBIC AND ANAEROBIC Blood Culture results may not be optimal due to an excessive volume of blood received in culture bottles   Culture   Final    NO GROWTH 5 DAYS Performed at [facility], [address] [place], [place] [address]    Report Status 08/27/2017 FINAL  Final  MRSA PCR Screening     Status: None   Collection Time: 08/22/17  9:18 PM  Result Value Ref Range Status   MRSA by PCR NEGATIVE NEGATIVE Final    Comment:        The GeneXpert MRSA Assay (FDA approved for NASAL specimens only), is one component of a comprehensive MRSA colonization surveillance program. It is not intended to diagnose MRSA infection nor to guide or monitor treatment for MRSA infections. Performed at [facility], [address] [place], [place] [address]   Culture, blood (Routine X 2) w Reflex to ID Panel     Status: None (Preliminary result)   Collection Time: 08/29/17  9:31 PM  Result Value Ref Range Status   Specimen Description BLOOD LEFT FOOT  Final   Special Requests   Final    BOTTLES DRAWN AEROBIC AND ANAEROBIC Blood Culture adequate volume   Culture   Final    NO GROWTH < 12 HOURS Performed at [facility], [address] [place],  [address]    Report Status PENDING   Incomplete  Culture, blood (Routine X 2) w Reflex to ID Panel     Status: None (Preliminary result)   Collection Time: 08/29/17  9:50 PM  Result Value  Ref Range Status   Specimen Description BLOOD RIGHT ANKLE  Final   Special Requests   Final    BOTTLES DRAWN AEROBIC AND ANAEROBIC Blood Culture adequate volume   Culture   Final    NO GROWTH < 12 HOURS Performed at [facility], [address] [place], [place] [address]    Report Status PENDING  Incomplete    Coagulation Studies: Recent Labs    08/29/17 Jun 28, 2102  LABPROT 13.9  INR 1.08    Urinalysis: No results for input(s): COLORURINE, LABSPEC, PHURINE, GLUCOSEU, HGBUR, BILIRUBINUR, KETONESUR, PROTEINUR, UROBILINOGEN, NITRITE, LEUKOCYTESUR in the last 72 hours.  Invalid input(s): APPERANCEUR    Imaging: Dg Chest Port 1 View  Result Date: 08/29/2017 CLINICAL DATA:  Central line placement EXAM: PORTABLE CHEST 1 VIEW COMPARISON:  08/26/2017 FINDINGS: Right-sided PICC line is difficult to visualize centrally but likely similar. A left-sided internal jugular catheter terminates at the mid to low SVC. Midline trachea. Mild cardiomegaly. Atherosclerosis in the transverse aorta. Small bilateral pleural effusions, similar on the left and new or increased on the right. No pneumothorax. Moderate interstitial edema, increased. Left greater than right base airspace disease, similar. IMPRESSION: Left-sided central line with tip at mid to low SVC; no pneumothorax. Worsened aeration, with progressive interstitial edema and new or enlarging right pleural effusion. A small left pleural effusion is similar. Bibasilar airspace disease which is most likely atelectasis. Concurrent infection or aspiration cannot be excluded. Electronically Signed   By: Abigail Miyamoto M.D.   On: 08/29/2017 14:28     Medications:   . cefTRIAXone (ROCEPHIN)  IV Stopped (08/29/17 1201)  . doxycycline (VIBRAMYCIN) IV Stopped (08/30/17 0032)  . norepinephrine  (LEVOPHED) Adult infusion Stopped (08/27/17 0135)  .  sodium bicarbonate  infusion 1000 mL 50 mL/hr at 08/30/17 0800   . chlorhexidine  15 mL Mouth Rinse BID  . Chlorhexidine Gluconate Cloth  6 each Topical Q0600  . feeding supplement (NEPRO CARB STEADY)  237 mL Oral BID BM  . insulin aspart  0-15 Units Subcutaneous Q4H  . letrozole  2.5 mg Oral Daily  . mouth rinse  15 mL Mouth Rinse q12n4p  . sodium chloride flush  10-40 mL Intracatheter Q12H   acetaminophen **OR** acetaminophen, bisacodyl, heparin lock flush, hydrALAZINE, ipratropium-albuterol, metoprolol tartrate, [DISCONTINUED] ondansetron **OR** ondansetron (ZOFRAN) IV, sodium chloride flush  Assessment/ Plan:  72 y.o. female with diabetes, coronary disease, sleep apnea, history of breast cancer, was admitted on 08/22/2017 with fever, chills, tick bite.   1.  Acute renal failure due to hypotension, sepsis. 2.  Sepsis 3.  Urinary tract infection-Klebsiella, Proteus 4.  Elevated troponin/demand ischemia 5.  Hypotension 6.  Thrombocytopenia 7.  Hyponatremia, Hypokalemia 8.  Lower extremity Edema 9.  Acute respiratory failure, on bipap.  -Patient seen and evaluated during hemodialysis.  Ultrafiltration to be increased to 1 kg today.  She remains critically ill at this point in time.  She remains full code currently.  Patient being continued on BiPAP for the moment.  We will likely plan for dialysis tomorrow as well.  Overall continues to have guarded prognosis.  Further plan as patient progresses.     LOS: 8 Antwann Preziosi 6/17/201910:55 AM  [place], [place]  Note: This note was prepared with Dragon dictation. Any transcription errors are unintentional

## 2017-08-30 NOTE — Progress Notes (Signed)
HD tx start    08/30/17 1007  Vital Signs  Temp (!) 95.9 F (35.5 C)  Temp Source Core  Pulse Rate 93  Pulse Rate Source Monitor  Resp (!) 24  BP (!) 166/55  BP Location Right Leg  BP Method Automatic  Patient Position (if appropriate) Lying  Oxygen Therapy  SpO2 97 %  O2 Device Bi-PAP  FiO2 (%) 45 %  During Hemodialysis Assessment  Blood Flow Rate (mL/min) 250 mL/min  Arterial Pressure (mmHg) -70 mmHg  Venous Pressure (mmHg) 70 mmHg  Transmembrane Pressure (mmHg) 70 mmHg  Ultrafiltration Rate (mL/min) 400 mL/min  Dialysate Flow Rate (mL/min) 500 ml/min  Conductivity: Machine  14  HD Safety Checks Performed Yes  Dialysis Fluid Bolus Normal Saline  Bolus Amount (mL) 250 mL  Intra-Hemodialysis Comments Tx initiated  Hemodialysis Catheter Left Internal jugular Triple-lumen  Placement Date/Time: 08/29/17 1400   Time Out: Correct patient;Correct site;Correct procedure  Maximum sterile barrier precautions: Hand hygiene;Sterile gloves;Cap;Large sterile sheet;Mask;Sterile gown  Site Prep: Chlorhexidine  Local Anesthetic: Inje...  Blue Lumen Status Infusing  Red Lumen Status Infusing

## 2017-08-30 NOTE — Progress Notes (Signed)
Remains somnolent. Initially on BiPAP. Presently comfortable on [place] 4 LPM. Not F/C. Opens eyes to tactile stimulation. No overt distress  Vitals:   08/30/17 1230 08/30/17 1240 08/30/17 1247 08/30/17 1251  BP: 136/64 (!) 158/62 (!) 166/46   Pulse: 82 92 82 85  Resp: 20 (!) 21 (!) 24 20  Temp: (!) 95.5 F (35.3 C) (!) 95.5 F (35.3 C) (!) 95.5 F (35.3 C) (!) 95.5 F (35.3 C)  TempSrc: Core Core Core   SpO2: 97% 97% 97% 97%  Weight:   242 lb 8.1 oz (110 kg)   Height:       Gen: Somnolent, NAD HEENT: NCAT, sclera white, facial flushing Neck: JVP not visualized Lungs: breath sounds full anteriorly without wheezes or other adventitious sounds Cardiovascular: RRR, no murmurs Abdomen: Obese, soft, nontender, normal BS Ext: without clubbing, cyanosis, edema Neuro: CNs intact, MAEs Skin: Limited exam, no lesions noted    BMP Latest Ref Rng & Units 08/30/2017 08/29/2017 08/29/2017  Glucose 65 - 99 mg/dL 215(H) 213(H) 192(H)  BUN 6 - 20 mg/dL 93(H) 91(H) 96(H)  Creatinine 0.44 - 1.00 mg/dL 10.20(H) 10.07(H) 10.86(H)  Sodium 135 - 145 mmol/L 139 133(L) 129(L)  Potassium 3.5 - 5.1 mmol/L 3.8 4.3 4.2  Chloride 101 - 111 mmol/L 103 101 100(L)  CO2 22 - 32 mmol/L 20(L) 15(L) 15(L)  Calcium 8.9 - 10.3 mg/dL 7.4(L) 7.7(L) 7.6(L)    CBC Latest Ref Rng & Units 08/29/2017 08/29/2017 08/28/2017  WBC 3.6 - 11.0 K/uL 8.2 8.3 5.5  Hemoglobin 12.0 - 16.0 g/dL 10.4(L) 10.7(L) 8.9(L)  Hematocrit 35.0 - 47.0 % 30.3(L) 31.5(L) 26.3(L)  Platelets 150 - 440 K/uL 78(L) 63(L) 54(L)    Hepatic Function Latest Ref Rng & Units 08/29/2017 08/28/2017 08/26/2017  Total Protein 6.5 - 8.1 g/dL 6.0(L) 5.5(L) 5.3(L)  Albumin 3.5 - 5.0 g/dL 2.7(L) 2.8(L) 2.3(L)  AST 15 - 41 U/L 69(H) 134(H) 398(H)  ALT 14 - 54 U/L 12(L) 15 64(H)  Alk Phosphatase 38 - 126 U/L 134(H) 127(H) 81  Total Bilirubin 0.3 - 1.2 mg/dL 0.6 0.7 0.9   Results for orders placed or performed during the hospital encounter of 08/22/17  Blood Culture  (routine x 2)     Status: None   Collection Time: 08/22/17 12:52 PM  Result Value Ref Range Status   Specimen Description BLOOD RIGHT HAND  Final   Special Requests   Final    BOTTLES DRAWN AEROBIC AND ANAEROBIC Blood Culture adequate volume   Culture   Final    NO GROWTH 5 DAYS Performed at [facility], [address], [place], [place] [address]    Report Status 08/27/2017 FINAL  Final  Urine culture     Status: Abnormal   Collection Time: 08/22/17 12:52 PM  Result Value Ref Range Status   Specimen Description   Final    URINE, RANDOM Performed at [facility], [address], [place], [place] [address]    Special Requests   Final    NONE Performed at [facility], [address] [place], [place] [address]    Culture (A)  Final    >=100,000 COLONIES/mL KLEBSIELLA PNEUMONIAE 60,000 COLONIES/mL PROTEUS MIRABILIS    Report Status 08/24/2017 FINAL  Final   Organism ID, Bacteria KLEBSIELLA PNEUMONIAE (A)  Final   Organism ID, Bacteria PROTEUS MIRABILIS (A)  Final      Susceptibility   Klebsiella pneumoniae - MIC*    AMPICILLIN RESISTANT Resistant  CEFAZOLIN <=4 SENSITIVE Sensitive     CEFTRIAXONE <=1 SENSITIVE Sensitive     CIPROFLOXACIN <=0.25 SENSITIVE Sensitive     GENTAMICIN <=1 SENSITIVE Sensitive     IMIPENEM <=0.25 SENSITIVE Sensitive     NITROFURANTOIN 64 INTERMEDIATE Intermediate     TRIMETH/SULFA <=20 SENSITIVE Sensitive     AMPICILLIN/SULBACTAM 4 SENSITIVE Sensitive     PIP/TAZO <=4 SENSITIVE Sensitive     Extended ESBL NEGATIVE Sensitive     * >=100,000 COLONIES/mL KLEBSIELLA PNEUMONIAE   Proteus mirabilis - MIC*    AMPICILLIN <=2 SENSITIVE Sensitive     CEFAZOLIN <=4 SENSITIVE Sensitive     CEFTRIAXONE <=1 SENSITIVE Sensitive     CIPROFLOXACIN <=0.25 SENSITIVE Sensitive     GENTAMICIN <=1 SENSITIVE Sensitive     IMIPENEM 1 SENSITIVE Sensitive     NITROFURANTOIN 128 RESISTANT Resistant     TRIMETH/SULFA <=20 SENSITIVE  Sensitive     AMPICILLIN/SULBACTAM <=2 SENSITIVE Sensitive     PIP/TAZO <=4 SENSITIVE Sensitive     * 60,000 COLONIES/mL PROTEUS MIRABILIS  Blood Culture (routine x 2)     Status: None   Collection Time: 08/22/17 12:53 PM  Result Value Ref Range Status   Specimen Description BLOOD RIGHT ARM  Final   Special Requests   Final    BOTTLES DRAWN AEROBIC AND ANAEROBIC Blood Culture results may not be optimal due to an excessive volume of blood received in culture bottles   Culture   Final    NO GROWTH 5 DAYS Performed at [facility], [address] [place], [place] [address]    Report Status 08/27/2017 FINAL  Final  MRSA PCR Screening     Status: None   Collection Time: 08/22/17  9:18 PM  Result Value Ref Range Status   MRSA by PCR NEGATIVE NEGATIVE Final    Comment:        The GeneXpert MRSA Assay (FDA approved for NASAL specimens only), is one component of a comprehensive MRSA colonization surveillance program. It is not intended to diagnose MRSA infection nor to guide or monitor treatment for MRSA infections. Performed at [facility], [address] [place], [place] [address]   Culture, blood (Routine X 2) w Reflex to ID Panel     Status: None (Preliminary result)   Collection Time: 08/29/17  9:31 PM  Result Value Ref Range Status   Specimen Description BLOOD LEFT FOOT  Final   Special Requests   Final    BOTTLES DRAWN AEROBIC AND ANAEROBIC Blood Culture adequate volume   Culture   Final    NO GROWTH < 12 HOURS Performed at [facility], [address] [place], [place] [address]    Report Status PENDING  Incomplete  Culture, blood (Routine X 2) w Reflex to ID Panel     Status: None (Preliminary result)   Collection Time: 08/29/17  9:50 PM  Result Value Ref Range Status   Specimen Description BLOOD RIGHT ANKLE  Final   Special Requests   Final    BOTTLES DRAWN AEROBIC AND ANAEROBIC Blood Culture adequate volume   Culture   Final    NO  GROWTH < 12 HOURS Performed at [facility], [address] [place], [place] [address]    Report Status PENDING  Incomplete    Anti-infectives (From admission, onward)   Start     Dose/Rate Route Frequency Ordered Stop   08/25/17 1100  cefTRIAXone (ROCEPHIN) 1 g in sodium chloride 0.9 % 100 mL  IVPB     1 g 200 mL/hr over 30 Minutes Intravenous Every 24 hours 08/25/17 1048     08/25/17 1100  doxycycline (VIBRAMYCIN) 100 mg in sodium chloride 0.9 % 250 mL IVPB     100 mg 125 mL/hr over 120 Minutes Intravenous Every 12 hours 08/25/17 1057     08/24/17 1230  ceFAZolin (ANCEF) IVPB 1 g/50 mL premix  Status:  Discontinued     1 g 100 mL/hr over 30 Minutes Intravenous Every 12 hours 08/24/17 1224 08/25/17 1048   08/22/17 2200  piperacillin-tazobactam (ZOSYN) IVPB 3.375 g  Status:  Discontinued     3.375 g 12.5 mL/hr over 240 Minutes Intravenous Every 8 hours 08/22/17 1509 08/24/17 1221   08/22/17 2200  vancomycin (VANCOCIN) IVPB 1000 mg/200 mL premix  Status:  Discontinued     1,000 mg 200 mL/hr over 60 Minutes Intravenous Every 24 hours 08/22/17 1509 08/23/17 2008   08/22/17 1400  piperacillin-tazobactam (ZOSYN) IVPB 3.375 g     3.375 g 100 mL/hr over 30 Minutes Intravenous  Once 08/22/17 1355 08/22/17 1431   08/22/17 1400  vancomycin (VANCOCIN) IVPB 1000 mg/200 mL premix     1,000 mg 200 mL/hr over 60 Minutes Intravenous  Once 08/22/17 1355 08/22/17 1510      CXR: NNF  IMPRESSION: Severe sepsis of unclear etiology Recent tick bite - concern for ehrlichiosis (serologies pending) Probable LLL PNA Acute encephalopathy, relapsing Elevated trop I, likely demand ischemia - resolving Pulmonary edema, resolving Chronic LBBB AKI, now on HD DM 2, controlled Thrombocytopenia  Kelbsiella/proteus UTI - fully treated H/O breast Ca H/O CAD  PLAN/REC: Cont supp O2 to maintain SpO2 > 90% Cont BiPAP as needed Cardiology following - will need ischemia eval after recovers from  this acute illness Monitor BMET intermittently Monitor I/Os Correct electrolytes as indicated HD per Nephrology Monitor LFTs intermittently Monitor temp, WBC count Micro and abx as above ID Service following DVT px: SCDs Monitor CBC intermittently Transfuse per usual guidelines If cognition not improving by 8/18, will consider Neuro consultation   Might require intubation if MRI needed TSH ordered  Family updated @ bedside  Merton Border, MD PCCM service Mobile [phone] Pager [phone] 08/30/2017 2:36 PM

## 2017-08-31 ENCOUNTER — Inpatient Hospital Stay: Payer: PPO

## 2017-08-31 DIAGNOSIS — R6520 Severe sepsis without septic shock: Secondary | ICD-10-CM

## 2017-08-31 DIAGNOSIS — J9601 Acute respiratory failure with hypoxia: Secondary | ICD-10-CM

## 2017-08-31 DIAGNOSIS — A419 Sepsis, unspecified organism: Secondary | ICD-10-CM

## 2017-08-31 LAB — COMPREHENSIVE METABOLIC PANEL
ALT: 11 U/L — ABNORMAL LOW (ref 14–54)
ANION GAP: 14 (ref 5–15)
AST: 48 U/L — ABNORMAL HIGH (ref 15–41)
Albumin: 2.8 g/dL — ABNORMAL LOW (ref 3.5–5.0)
Alkaline Phosphatase: 112 U/L (ref 38–126)
BUN: 72 mg/dL — ABNORMAL HIGH (ref 6–20)
CO2: 22 mmol/L (ref 22–32)
Calcium: 7.2 mg/dL — ABNORMAL LOW (ref 8.9–10.3)
Chloride: 104 mmol/L (ref 101–111)
Creatinine, Ser: 7.97 mg/dL — ABNORMAL HIGH (ref 0.44–1.00)
GFR calc non Af Amer: 4 mL/min — ABNORMAL LOW (ref >60)
GFR, EST AFRICAN AMERICAN: 5 mL/min — AB (ref >60)
Glucose, Bld: 225 mg/dL — ABNORMAL HIGH (ref 65–99)
POTASSIUM: 3.3 mmol/L — AB (ref 3.5–5.1)
SODIUM: 140 mmol/L (ref 135–145)
Total Bilirubin: 0.8 mg/dL (ref 0.3–1.2)
Total Protein: 6.3 g/dL — ABNORMAL LOW (ref 6.5–8.1)

## 2017-08-31 LAB — CBC
HCT: 29.1 % — ABNORMAL LOW (ref 35.0–47.0)
Hemoglobin: 10 g/dL — ABNORMAL LOW (ref 12.0–16.0)
MCH: 31.5 pg (ref 26.0–34.0)
MCHC: 34.2 g/dL (ref 32.0–36.0)
MCV: 92 fL (ref 80.0–100.0)
Platelets: 86 10*3/uL — ABNORMAL LOW (ref 150–440)
RBC: 3.16 MIL/uL — AB (ref 3.80–5.20)
RDW: 15.5 % — AB (ref 11.5–14.5)
WBC: 10.2 10*3/uL (ref 3.6–11.0)

## 2017-08-31 LAB — HEPATITIS B SURFACE ANTIGEN: HEP B S AG: NEGATIVE

## 2017-08-31 LAB — EHRLICHIA ANTIBODY PANEL
E chaffeensis (HGE) Ab, IgG: NEGATIVE
E chaffeensis (HGE) Ab, IgM: NEGATIVE
E. CHAFFEENSIS (HME) IGM TITER: NEGATIVE
E.Chaffeensis (HME) IgG: NEGATIVE

## 2017-08-31 LAB — BLOOD GAS, ARTERIAL
Acid-base deficit: 0.1 mmol/L (ref 0.0–2.0)
Bicarbonate: 26.3 mmol/L (ref 20.0–28.0)
FIO2: 0.5
MECHVT: 500 mL
O2 Saturation: 98.5 %
PATIENT TEMPERATURE: 37
PEEP: 5 cmH2O
PO2 ART: 123 mmHg — AB (ref 83.0–108.0)
RATE: 16 resp/min
pCO2 arterial: 51 mmHg — ABNORMAL HIGH (ref 32.0–48.0)
pH, Arterial: 7.32 — ABNORMAL LOW (ref 7.350–7.450)

## 2017-08-31 LAB — HEPATITIS B SURFACE ANTIBODY,QUALITATIVE: HEP B S AB: REACTIVE

## 2017-08-31 LAB — GLUCOSE, CAPILLARY
GLUCOSE-CAPILLARY: 210 mg/dL — AB (ref 65–99)
GLUCOSE-CAPILLARY: 194 mg/dL — AB (ref 65–99)
Glucose-Capillary: 204 mg/dL — ABNORMAL HIGH (ref 65–99)
Glucose-Capillary: 151 mg/dL — ABNORMAL HIGH (ref 65–99)
Glucose-Capillary: 165 mg/dL — ABNORMAL HIGH (ref 65–99)

## 2017-08-31 LAB — TSH: TSH: 0.183 u[IU]/mL — ABNORMAL LOW (ref 0.350–4.500)

## 2017-08-31 LAB — HEPATITIS B CORE ANTIBODY, TOTAL: HEP B C TOTAL AB: NEGATIVE

## 2017-08-31 MED ORDER — FENTANYL CITRATE (PF) 100 MCG/2ML IJ SOLN
50.0000 ug | INTRAMUSCULAR | Status: DC | PRN
  Administered 2017-08-31: 50 ug via INTRAVENOUS
  Filled 2017-08-31: qty 2

## 2017-08-31 MED ORDER — MIDAZOLAM HCL 2 MG/2ML IJ SOLN
4.0000 mg | Freq: Once | INTRAMUSCULAR | Status: AC
  Administered 2017-08-31: 4 mg via INTRAVENOUS
  Filled 2017-08-31: qty 4

## 2017-08-31 MED ORDER — ATROPINE SULFATE 1 MG/10ML IJ SOSY
PREFILLED_SYRINGE | INTRAMUSCULAR | Status: AC
  Administered 2017-08-31: 1 mg via INTRAVENOUS
  Filled 2017-08-31: qty 10

## 2017-08-31 MED ORDER — PROPOFOL 1000 MG/100ML IV EMUL
INTRAVENOUS | Status: AC
  Administered 2017-08-31: 15 ug/kg/min via INTRAVENOUS
  Filled 2017-08-31: qty 100

## 2017-08-31 MED ORDER — PROPOFOL 1000 MG/100ML IV EMUL
0.0000 ug/kg/min | INTRAVENOUS | Status: DC
  Administered 2017-08-31: 15 ug/kg/min via INTRAVENOUS
  Filled 2017-08-31 (×2): qty 100

## 2017-08-31 MED ORDER — NEPRO/CARBSTEADY PO LIQD
1000.0000 mL | ORAL | Status: DC
  Administered 2017-08-31: 1000 mL

## 2017-08-31 MED ORDER — ATROPINE SULFATE 1 MG/10ML IJ SOSY
1.0000 mg | PREFILLED_SYRINGE | Freq: Once | INTRAMUSCULAR | Status: AC
  Administered 2017-08-31: 1 mg via INTRAVENOUS

## 2017-08-31 MED ORDER — FENTANYL CITRATE (PF) 100 MCG/2ML IJ SOLN
INTRAMUSCULAR | Status: AC
  Administered 2017-08-31: 50 ug via INTRAVENOUS
  Filled 2017-08-31: qty 2

## 2017-08-31 MED ORDER — VECURONIUM BROMIDE 10 MG IV SOLR
10.0000 mg | Freq: Once | INTRAVENOUS | Status: AC
  Administered 2017-08-31: 10 mg via INTRAVENOUS
  Filled 2017-08-31: qty 10

## 2017-08-31 MED ORDER — INSULIN GLARGINE 100 UNIT/ML ~~LOC~~ SOLN
10.0000 [IU] | Freq: Every day | SUBCUTANEOUS | Status: DC
Start: 2017-08-31 — End: 2017-09-15
  Administered 2017-08-31 – 2017-09-15 (×16): 10 [IU] via SUBCUTANEOUS
  Filled 2017-08-31 (×18): qty 0.1

## 2017-08-31 MED ORDER — ORAL CARE MOUTH RINSE
15.0000 mL | OROMUCOSAL | Status: DC
  Administered 2017-08-31 – 2017-09-04 (×31): 15 mL via OROMUCOSAL

## 2017-08-31 MED ORDER — FENTANYL CITRATE (PF) 100 MCG/2ML IJ SOLN
200.0000 ug | Freq: Once | INTRAMUSCULAR | Status: AC
  Administered 2017-08-31: 100 ug via INTRAVENOUS
  Filled 2017-08-31: qty 4

## 2017-08-31 MED ORDER — FREE WATER
200.0000 mL | Freq: Three times a day (TID) | Status: DC
  Administered 2017-08-31 – 2017-09-05 (×14): 200 mL

## 2017-08-31 MED ORDER — PRO-STAT SUGAR FREE PO LIQD
30.0000 mL | Freq: Every day | ORAL | Status: DC
  Administered 2017-08-31 – 2017-09-01 (×2): 30 mL

## 2017-08-31 MED ORDER — B COMPLEX-C PO TABS
1.0000 | ORAL_TABLET | Freq: Every day | ORAL | Status: DC
  Administered 2017-08-31 – 2017-09-04 (×5): 1
  Filled 2017-08-31 (×10): qty 1

## 2017-08-31 MED ORDER — INSULIN ASPART 100 UNIT/ML ~~LOC~~ SOLN
3.0000 [IU] | SUBCUTANEOUS | Status: DC
  Administered 2017-08-31 – 2017-09-09 (×31): 3 [IU] via SUBCUTANEOUS
  Filled 2017-08-31 (×29): qty 1

## 2017-08-31 MED ORDER — FENTANYL CITRATE (PF) 100 MCG/2ML IJ SOLN
50.0000 ug | INTRAMUSCULAR | Status: DC | PRN
  Administered 2017-09-01 (×4): 50 ug via INTRAVENOUS
  Filled 2017-08-31 (×3): qty 2

## 2017-08-31 MED ORDER — NOREPINEPHRINE 4 MG/250ML-% IV SOLN
0.0000 ug/min | INTRAVENOUS | Status: DC
  Administered 2017-08-31: 2 ug/min via INTRAVENOUS
  Filled 2017-08-31: qty 250

## 2017-08-31 NOTE — Progress Notes (Signed)
[facility] [place] NAME: Bonnie Holt    MR#:  [id number]  DATE OF BIRTH:  Dec 27, 1945  SUBJECTIVE:   Her mental status is still somewhat poor.  Patient was on BiPAP but now weaned off of it.  Tolerated hemodialysis well yesterday and plan for another treatment today.  REVIEW OF SYSTEMS:   Review of Systems  Unable to perform ROS: Mental acuity   Tolerating Diet: No Tolerating PT: Await Eval.  DRUG ALLERGIES:   Allergies  Allergen Reactions  . Ace Inhibitors Cough    VITALS:  Blood pressure (!) 127/55, pulse (!) 109, temperature 98.8 F (37.1 C), resp. rate (!) 33, height 5\' 2"  (1.575 m), weight 106.7 kg (235 lb 3.7 oz), SpO2 91 %.  PHYSICAL EXAMINATION:   Physical Exam  GENERAL:  72 y.o.-year-old obese patient lying in the bed encephalopathic and in NAD.  EYES: Pupils equal, round, reactive to light. No scleral icterus. Extraocular muscles intact.  HEENT: Head atraumatic, normocephalic. Dry Oral mucosa. NECK:  Supple, no jugular venous distention. No thyroid enlargement, no tenderness.  LUNGS: Poor Resp. effort, no wheezing, rales, rhonchi. No use of accessory muscles of respiration.  CARDIOVASCULAR: S1, S2 normal. No murmurs, rubs, or gallops.  ABDOMEN: Soft, nontender, nondistended. Bowel sounds present. No organomegaly or mass.  EXTREMITIES: No cyanosis, clubbing, +2 edema from ankles to knees b/l.    NEUROLOGIC: Lethargic/encephalopathic but moves all extremities spontaneously. Does not follow commands. PSYCHIATRIC: Lethargic/Enceophalpathic.    SKIN: No obvious rash, lesion, or ulcer.   LABORATORY PANEL:  CBC Recent Labs  Lab 08/31/17 0400  WBC 10.2  HGB 10.0*  HCT 29.1*  PLT 86*    Chemistries  Recent Labs  Lab 08/29/17 2104  08/31/17 0400  NA 133*   < > 140  K 4.3   < > 3.3*  CL 101   < > 104  CO2 15*   < > 22  GLUCOSE 213*   < > 225*  BUN 91*   < > 72*  CREATININE 10.07*   < > 7.97*   CALCIUM 7.7*   < > 7.2*  MG 2.7*  --   --   AST 69*  --  48*  ALT 12*  --  11*  ALKPHOS 134*  --  112  BILITOT 0.6  --  0.8   < > = values in this interval not displayed.   Cardiac Enzymes Recent Labs  Lab 08/30/17 0652  TROPONINI 0.12*   RADIOLOGY:  Dg Abd 1 View  Result Date: 08/31/2017 CLINICAL DATA:  Nasogastric tube placement. EXAM: ABDOMEN - 1 VIEW COMPARISON:  None. FINDINGS: The bowel gas pattern is normal. Distal tip of feeding tube is seen in expected position of stomach. No radio-opaque calculi or other significant radiographic abnormality are seen. IMPRESSION: No evidence of bowel obstruction or ileus. Distal tip of feeding tube seen in expected position of stomach. Electronically Signed   By: Marijo Conception, M.D.   On: 08/31/2017 12:12   Dg Chest Port 1 View  Result Date: 08/31/2017 CLINICAL DATA:  Respiratory failure. EXAM: PORTABLE CHEST 1 VIEW COMPARISON:  08/29/2017. FINDINGS: Dual-lumen left IJ catheter tip in upper SVC. Right PICC line tip in upper SVC. Cardiomegaly with diffuse interstitial prominence again noted. Bilateral pleural effusions again noted. Findings suggest CHF. Bilateral pneumonitis cannot be excluded. Low lung volumes with bibasilar atelectasis/infiltrates. No pneumothorax. IMPRESSION: 1. Dual-lumen left IJ catheter tip in upper SVC. Right PICC  line tip in upper SVC. No pneumothorax. 2. Cardiomegaly with diffuse interstitial prominence bilateral pleural effusions suggesting CHF again noted. Bibasilar atelectasis/infiltrates again noted. Similar findings noted on prior exam. Electronically Signed   By: [place]   On: 08/31/2017 05:53   ASSESSMENT AND PLAN:   [name] is a 72 y.o. female has a past medical history significant for DM, HTN, and CAD now with 3 day hx of fever, chills, and back apin. Now confused and lethargic. In ER, pt febrile and tachycardic. Lactic acid elevated c/w with sepsis. UA abnormal and CXR negative  1.  Sepsis  secondary to likely UTI/suspected RMSF/ehrlichiosis -Appreciate infectious disease input.  Patient's [facility] spotted fever serologies negative.  Ehrlichiosis serologies have been sent but currently pending. - Urine cultures are + for  Klebsiella pneumonia and Proteus - blood culture is negative - Continue IV ceftriaxone, doxycycline.    2. acute hypoxic respiratory failure secondary to pulmonary edema in the setting of IV fluid volume overload - cont. Fluid removal with HD and Bipap as tolerated.  Improving as pt. Is off Bipap today.  -Continue weaning as per intensivist/pulmonary.  3 Acute Renal failure - likely due to ATN from sepsis.  - pt's urine output slightly improved. Cr. Trending down after HD yesterday.  Plan for another HD today. Nephrology following and appreciate input.  -Continue further care as per nephrology.  4. Elevated troponin - demand ischemia in the setting of sepsis -patient denies any chest pain -cardiology consultation placed with dr [name] and no plan for any acute intervention.  - Meds on hold due to sepsis. Hypotension.  -echo shows EF 50-55% with no wall motional abnormalities.   5. Hx of Breast Cancer - cont. Femara  6.  Thrombocytopenia-secondary to underlying sepsis.   -plt count improving and today up to 86.  No acute bleeding.   7.  Mixed metabolic/respiratory acidosis-secondary to underlying renal failure combined with pulmonary edema. - Continue supportive care with BiPAP, HD and follow ABG.   Prognosis is guarded to poor. Pt. Is FULL CODE.   Case discussed with Care Management/Social Worker. Management plans discussed with the patient, family and they are in agreement.  CODE STATUS: full  DVT Prophylaxis: TEd's & SCD's.   TOTAL TIME TAKING CARE OF THIS PATIENT: 30 mins   POSSIBLE D/C unclear, DEPENDING ON CLINICAL CONDITION and course.  Note: This dictation was prepared with Dragon dictation along with smaller phrase technology. Any  transcriptional errors that result from this process are unintentional.  [name] M.D on 08/31/2017 at 3:21 PM  Between 7am to 6pm - Pager - [phone]  After 6pm go to [email or web address] - password [email or web address] [facility]  Office  [phone]  CC: Primary care physician; Kirk Ruths, MDPatient ID: [name], female   DOB: [birth year], 72 y.o.   MRN: [id number]

## 2017-08-31 NOTE — Progress Notes (Signed)
Initial Nutrition Assessment  DOCUMENTATION CODES:   Morbid obesity  INTERVENTION:  Initiate Nepro 1.8 Cal at 45 mL/hr (1080 mL goal daily volume) + Pro-Stat 30 mL once daily via NGT. Provides 2044 kcal, 102 grams of protein, 788 mL H2O daily.  With free water flush of 200 mL Q8hrs patient will receive a total of 1388 mL H2O daily including water in tube feeding.  Provide B-complex with C per tube QHS.  Monitor magnesium, potassium, and phosphorus daily for at least 3 days, MD to replete as needed, as pt is at risk for refeeding syndrome given inadequate oral intake for 9 days prior to initiation of tube feeds.  NUTRITION DIAGNOSIS:   Inadequate oral intake related to inability to eat, lethargy/confusion as evidenced by NPO status.  GOAL:   Patient will meet greater than or equal to 90% of their needs  MONITOR:   Diet advancement, Labs, Weight trends, TF tolerance, I & O's  REASON FOR ASSESSMENT:   Low [place], LOS, Consult Enteral/tube feeding initiation and management  ASSESSMENT:   72 year old female with PMHx of HTN, DM type 2, CAD, breast cancer s/p partial left mastectomy who was admitted with severe sepsis of unclear etiology, recent tick bite with concern for ehrlichiosis, probable PNA, acute encephalopathy, UTI, acute renal failure due to hypotension and sepsis requiring HD.   -Patient underwent HD on 6/16, 6/17, and will also have HD again today. UF yesterday was 1L with plan for 1.5L UF today.  Met with patient at bedside. No family members present at time of RD assessment. Patient currently lethargic. Per discussion in rounds this week patient has had inadequate intake entire admission so far (today is day 9). Even when she was on a diet she was unable to eat more than bites.   Patient's weights in chart appear to be between 227-232 lbs. Will use weight of 102.4 kg from 6/11 to estimate needs.  Enteral Access: 16 Fr. NGT placed 6/18; terminates in stomach per  abdominal x-ray 6/18; 65 cm at right nare  Medications reviewed and include: free water flush 200 mL Q8hrs, Novolog 0-15 units Q4hrs, Novolog 3 units Q4hrs as tube feed coverage, Lantus 10 units daily, ceftriaxone, doxycycline.  Labs reviewed: CBG 194-217, Potassium 3.3, BUN 72, Creatinine 7.97.  I/O: only 217 mL UOP yesterday  Patient does not meet criteria for malnutrition at this time.  Discussed with RN and on rounds. Plan is for placement of NGT today to initiate tube feeds.  NUTRITION - FOCUSED PHYSICAL EXAM:    Most Recent Value  Orbital Region  No depletion  Upper Arm Region  No depletion  Thoracic and Lumbar Region  No depletion  Buccal Region  No depletion  Temple Region  No depletion  Clavicle Bone Region  No depletion  Clavicle and Acromion Bone Region  No depletion  Scapular Bone Region  Unable to assess  Dorsal Hand  No depletion  Patellar Region  Unable to assess  Anterior Thigh Region  Unable to assess  Posterior Calf Region  Unable to assess  Edema (RD Assessment)  Moderate  Hair  Reviewed  Eyes  Unable to assess  Mouth  Unable to assess  Skin  Reviewed  Nails  Reviewed     Diet Order:   Diet Order    None      EDUCATION NEEDS:   Not appropriate for education at this time  Skin:  Skin Assessment: Skin Integrity Issues:(MSAD bilateral groin)  Last BM:  08/28/2017 - medium type 6  Height:   Ht Readings from Last 1 Encounters:  08/22/17 _0  (1.575 m)    Weight:   Wt Readings from Last 1 Encounters:  08/31/17 235 lb 3.7 oz (106.7 kg)    Ideal Body Weight:  50 kg  BMI:  Body mass index is 43.02 kg/m.  Estimated Nutritional Needs:   Kcal:  2924-4628 (MSJ x 1.2-1.4)  Protein:  100-110 grams (0.9-1 grams/kg; 2 grams/kg IBW)  Fluid:  UOP + 1 L  Willey Blade, MS, RD, LDN Office: [phone] Pager: [phone] After Hours/Weekend Pager: [phone]

## 2017-08-31 NOTE — Progress Notes (Signed)
[name] INFECTIOUS DISEASE PROGRESS NOTE Date of Admission:  08/22/2017     ID: Bonnie Holt is a 72 y.o. female with Fevers, AMS  Principal Problem:   Sepsis ([place]) Active Problems:   UTI (urinary tract infection)   CAD (coronary artery disease)   Diabetes ([place])   ARF (acute renal failure) (HCC)   Elevated liver function tests   Palliative care encounter   Subjective: Off bipap, a little more responsive to voice.  For repeat  HD today  ROS  Unable to obtain  Medications:  Antibiotics Given (last 72 hours)    Date/Time Action Medication Dose Rate   08/28/17 2241 New Bag/Given   doxycycline (VIBRAMYCIN) 100 mg in sodium chloride 0.9 % 250 mL IVPB 100 mg 125 mL/hr   08/29/17 1129 New Bag/Given   cefTRIAXone (ROCEPHIN) 1 g in sodium chloride 0.9 % 100 mL IVPB 1 g 200 mL/hr   08/29/17 1132 New Bag/Given   doxycycline (VIBRAMYCIN) 100 mg in sodium chloride 0.9 % 250 mL IVPB 100 mg 125 mL/hr   08/29/17 2232 New Bag/Given   doxycycline (VIBRAMYCIN) 100 mg in sodium chloride 0.9 % 250 mL IVPB 100 mg 125 mL/hr   08/30/17 1151 New Bag/Given   cefTRIAXone (ROCEPHIN) 1 g in sodium chloride 0.9 % 100 mL IVPB 1 g 200 mL/hr   08/30/17 1239 New Bag/Given   doxycycline (VIBRAMYCIN) 100 mg in sodium chloride 0.9 % 250 mL IVPB 100 mg 125 mL/hr   08/30/17 2205 New Bag/Given   doxycycline (VIBRAMYCIN) 100 mg in sodium chloride 0.9 % 250 mL IVPB 100 mg 125 mL/hr   08/31/17 1059 New Bag/Given   cefTRIAXone (ROCEPHIN) 1 g in sodium chloride 0.9 % 100 mL IVPB 1 g 200 mL/hr   08/31/17 1059 New Bag/Given   doxycycline (VIBRAMYCIN) 100 mg in sodium chloride 0.9 % 250 mL IVPB 100 mg 125 mL/hr     . B-complex with vitamin C  1 tablet Per Tube QHS  . chlorhexidine  15 mL Mouth Rinse BID  . Chlorhexidine Gluconate Cloth  6 each Topical Q0600  . [START ON 09/01/2017] feeding supplement (NEPRO CARB STEADY)  1,000 mL Per Tube Q24H  . feeding supplement (PRO-STAT SUGAR FREE 64)  30 mL Per Tube  Daily  . free water  200 mL Per Tube Q8H  . insulin aspart  0-15 Units Subcutaneous Q4H  . insulin aspart  3 Units Subcutaneous Q4H  . insulin glargine  10 Units Subcutaneous Daily  . letrozole  2.5 mg Oral Daily  . mouth rinse  15 mL Mouth Rinse q12n4p  . sodium chloride flush  10-40 mL Intracatheter Q12H    Objective: Vital signs in last 24 hours: Temp:  [96.8 F (36 C)-98.8 F (37.1 C)] 98.8 F (37.1 C) (06/18 1500) Pulse Rate:  [100-115] 109 (06/18 1500) Resp:  [23-35] 33 (06/18 1500) BP: (105-189)/(46-93) 127/55 (06/18 1500) SpO2:  [90 %-96 %] 91 % (06/18 1500) FiO2 (%):  [35 %] 35 % (06/18 0700) Weight:  [106.7 kg (235 lb 3.7 oz)] 106.7 kg (235 lb 3.7 oz) (06/18 0408) Constitutional: confused, able to arouse HENT: [place]/AT, PERRLA, no scleral icterus Mouth/Throat: Oropharynx is clear and dry. No oropharyngeal exudate.  Cardiovascular: Normal rate, regular rhythm and normal heart sounds.  Pulmonary/Chest: Effort normal and breath sounds normal. No respiratory distress.  has no wheezes.  Neck = supple, no nuchal rigidity Abdominal: Soft. Bowel sounds are normal.  exhibits no distension. There is no tenderness.  Lymphadenopathy:  no cervical adenopathy. No axillary adenopathy Neurological: confused, asteresix Skin: Skin is warm and dry. No rash noted. No erythema.  Psychiatric: confused  Lab Results Recent Labs    08/29/17 2104 08/30/17 0652 08/31/17 0400  WBC 8.2  --  10.2  HGB 10.4*  --  10.0*  HCT 30.3*  --  29.1*  NA 133* 139 140  K 4.3 3.8 3.3*  CL 101 103 104  CO2 15* 20* 22  BUN 91* 93* 72*  CREATININE 10.07* 10.20* 7.97*    Microbiology: Results for orders placed or performed during the hospital encounter of 08/22/17  Blood Culture (routine x 2)     Status: None   Collection Time: 08/22/17 12:52 PM  Result Value Ref Range Status   Specimen Description BLOOD RIGHT HAND  Final   Special Requests   Final    BOTTLES DRAWN AEROBIC AND ANAEROBIC Blood  Culture adequate volume   Culture   Final    NO GROWTH 5 DAYS Performed at [facility], [address] [place], [place] [address]    Report Status 08/27/2017 FINAL  Final  Urine culture     Status: Abnormal   Collection Time: 08/22/17 12:52 PM  Result Value Ref Range Status   Specimen Description   Final    URINE, RANDOM Performed at [facility], [address], [place], [place] [address]    Special Requests   Final    NONE Performed at [facility], [address], [place], [place] [address]    Culture (A)  Final    >=100,000 COLONIES/mL KLEBSIELLA PNEUMONIAE 60,000 COLONIES/mL PROTEUS MIRABILIS    Report Status 08/24/2017 FINAL  Final   Organism ID, Bacteria KLEBSIELLA PNEUMONIAE (A)  Final   Organism ID, Bacteria PROTEUS MIRABILIS (A)  Final      Susceptibility   Klebsiella pneumoniae - MIC*    AMPICILLIN RESISTANT Resistant     CEFAZOLIN <=4 SENSITIVE Sensitive     CEFTRIAXONE <=1 SENSITIVE Sensitive     CIPROFLOXACIN <=0.25 SENSITIVE Sensitive     GENTAMICIN <=1 SENSITIVE Sensitive     IMIPENEM <=0.25 SENSITIVE Sensitive     NITROFURANTOIN 64 INTERMEDIATE Intermediate     TRIMETH/SULFA <=20 SENSITIVE Sensitive     AMPICILLIN/SULBACTAM 4 SENSITIVE Sensitive     PIP/TAZO <=4 SENSITIVE Sensitive     Extended ESBL NEGATIVE Sensitive     * >=100,000 COLONIES/mL KLEBSIELLA PNEUMONIAE   Proteus mirabilis - MIC*    AMPICILLIN <=2 SENSITIVE Sensitive     CEFAZOLIN <=4 SENSITIVE Sensitive     CEFTRIAXONE <=1 SENSITIVE Sensitive     CIPROFLOXACIN <=0.25 SENSITIVE Sensitive     GENTAMICIN <=1 SENSITIVE Sensitive     IMIPENEM 1 SENSITIVE Sensitive     NITROFURANTOIN 128 RESISTANT Resistant     TRIMETH/SULFA <=20 SENSITIVE Sensitive     AMPICILLIN/SULBACTAM <=2 SENSITIVE Sensitive     PIP/TAZO <=4 SENSITIVE Sensitive     * 60,000 COLONIES/mL PROTEUS MIRABILIS  Blood Culture (routine x 2)     Status: None   Collection Time: 08/22/17 12:53 PM   Result Value Ref Range Status   Specimen Description BLOOD RIGHT ARM  Final   Special Requests   Final    BOTTLES DRAWN AEROBIC AND ANAEROBIC Blood Culture results may not be optimal due to an excessive volume of blood received in culture bottles   Culture   Final    NO GROWTH 5 DAYS Performed at [facility], [address],  [place], [place] [address]    Report Status 08/27/2017 FINAL  Final  MRSA PCR Screening     Status: None   Collection Time: 08/22/17  9:18 PM  Result Value Ref Range Status   MRSA by PCR NEGATIVE NEGATIVE Final    Comment:        The GeneXpert MRSA Assay (FDA approved for NASAL specimens only), is one component of a comprehensive MRSA colonization surveillance program. It is not intended to diagnose MRSA infection nor to guide or monitor treatment for MRSA infections. Performed at [facility], [address] [place], [place] [address]   Culture, blood (Routine X 2) w Reflex to ID Panel     Status: None (Preliminary result)   Collection Time: 08/29/17  9:31 PM  Result Value Ref Range Status   Specimen Description BLOOD LEFT FOOT  Final   Special Requests   Final    BOTTLES DRAWN AEROBIC AND ANAEROBIC Blood Culture adequate volume   Culture   Final    NO GROWTH 2 DAYS Performed at [facility], [address], [place], [place] [address]    Report Status PENDING  Incomplete  Culture, blood (Routine X 2) w Reflex to ID Panel     Status: None (Preliminary result)   Collection Time: 08/29/17  9:50 PM  Result Value Ref Range Status   Specimen Description BLOOD RIGHT ANKLE  Final   Special Requests   Final    BOTTLES DRAWN AEROBIC AND ANAEROBIC Blood Culture adequate volume   Culture   Final    NO GROWTH 2 DAYS Performed at [facility], [address] [place], [place] [address]    Report Status PENDING  Incomplete     Studies/Results: Dg Abd 1 View  Result Date: 08/31/2017 CLINICAL DATA:  Nasogastric tube  placement. EXAM: ABDOMEN - 1 VIEW COMPARISON:  None. FINDINGS: The bowel gas pattern is normal. Distal tip of feeding tube is seen in expected position of stomach. No radio-opaque calculi or other significant radiographic abnormality are seen. IMPRESSION: No evidence of bowel obstruction or ileus. Distal tip of feeding tube seen in expected position of stomach. Electronically Signed   By: Marijo Conception, M.D.   On: 08/31/2017 12:12   Dg Chest Port 1 View  Result Date: 08/31/2017 CLINICAL DATA:  Respiratory failure. EXAM: PORTABLE CHEST 1 VIEW COMPARISON:  08/29/2017. FINDINGS: Dual-lumen left IJ catheter tip in upper SVC. Right PICC line tip in upper SVC. Cardiomegaly with diffuse interstitial prominence again noted. Bilateral pleural effusions again noted. Findings suggest CHF. Bilateral pneumonitis cannot be excluded. Low lung volumes with bibasilar atelectasis/infiltrates. No pneumothorax. IMPRESSION: 1. Dual-lumen left IJ catheter tip in upper SVC. Right PICC line tip in upper SVC. No pneumothorax. 2. Cardiomegaly with diffuse interstitial prominence bilateral pleural effusions suggesting CHF again noted. Bibasilar atelectasis/infiltrates again noted. Similar findings noted on prior exam. Electronically Signed   By: [place]   On: 08/31/2017 05:53    Assessment/Plan: BRILYN TULLER is a 72 y.o. female admitted with  fevers, chills, unsteady gait x 3 days.  Had recent tick bite. Admitted with sepsis and started IV abx. Developed progressive SOB and transferred to ICU.  She has had recurrent fevers 6/9-6/12 but now deferevesicing. On admit wbc was 3.5, plts 55, UA 0-5 wbc. cr elevated and had continued to increase.  CXR was negative on admit but then developed pulm edema, CT head neg. Echo with ef 50-55 but poor study.  I suspect she  has Ehrlichia or RMSF.   Renal failure is out of proportion but likely multifactorial.  6/17- Much more altered. RUQ US showed fatty liver and stone in gb neck but  no cholecystitis.  AST ALT improving. Alk phos went up a little. Started HD.  Doxy day 6, day 6 of ctx as well. I think her confusion is likely from uremia.  6/18 cr down a little. More normothermic. Ehrlichia negative. Day 10 of admisison. Day 7 or doxy and ctx.  Recommendation Cont doxy for 10 days today Can dc ceftriaxone as other cxs negative.  Cont supportive care Thank you very much for the consult. Will follow with you.  Leonel Ramsay   08/31/2017, 3:28 PM

## 2017-08-31 NOTE — Progress Notes (Signed)
[facility], [place] 08/31/17  Subjective:  Patient still critically ill at the moment. Currently on BiPAP. Patient did undergo hemodialysis yesterday and ultrafiltration achieved was 1 kg. We are planning for dialysis today as well.   Objective:  Vital signs in last 24 hours:  Temp:  [95.4 F (35.2 C)-98.4 F (36.9 C)] 97.2 F (36.2 C) (06/18 0730) Pulse Rate:  [82-115] 104 (06/18 0730) Resp:  [18-35] 29 (06/18 0730) BP: (123-191)/(46-93) 138/47 (06/18 0730) SpO2:  [92 %-97 %] 95 % (06/18 0730) FiO2 (%):  [35 %-45 %] 35 % (06/18 0700) Weight:  [106.7 kg (235 lb 3.7 oz)-110 kg (242 lb 8.1 oz)] 106.7 kg (235 lb 3.7 oz) (06/18 0408)  Weight change: 3.6 kg (7 lb 15 oz) Filed Weights   08/30/17 0945 08/30/17 1247 08/31/17 0408  Weight: 110.4 kg (243 lb 6.2 oz) 110 kg (242 lb 8.1 oz) 106.7 kg (235 lb 3.7 oz)    Intake/Output:    Intake/Output Summary (Last 24 hours) at 08/31/2017 0951 Last data filed at 08/31/2017 0804 Gross per 24 hour  Intake 260 ml  Output 1201 ml  Net -941 ml   Physical Exam: General:  Critically ill-appearing, laying in the bed  HEENT  anicteric  Neck:  Supple  Lungs:  Scattered rhonchi, on BiPAP  Heart::  irregular  Abdomen:  Soft, nontender  Extremities:  1+ lower extremity edema  Neurologic:  arousable this AM  Skin:  No acute rashes    Basic Metabolic Panel:  Recent Labs  Lab 08/28/17 0831 08/29/17 0455 08/29/17 2104 08/30/17 0652 08/30/17 1019 08/31/17 0400  NA 128* 129* 133* 139  --  140  K 3.7 4.2 4.3 3.8  --  3.3*  CL 100* 100* 101 103  --  104  CO2 14* 15* 15* 20*  --  22  GLUCOSE 253* 192* 213* 215*  --  225*  BUN 81* 96* 91* 93*  --  72*  CREATININE 9.31* 10.86* 10.07* 10.20*  --  7.97*  CALCIUM 7.1* 7.6* 7.7* 7.4*  --  7.2*  MG 2.5*  --  2.7*  --   --   --   PHOS 6.2*  --  7.7*  --  8.2*  --      CBC: Recent Labs  Lab 08/27/17 0441 08/28/17 0734 08/29/17 0455 08/29/17 2104  08/31/17 0400  WBC 4.5 5.5 8.3 8.2 10.2  HGB 9.9* 8.9* 10.7* 10.4* 10.0*  HCT 28.7* 26.3* 31.5* 30.3* 29.1*  MCV 91.9 93.2 93.0 92.2 92.0  PLT 51* 54* 63* 78* 86*      Lab Results  Component Value Date   HEPBSAG Negative 08/29/2017   HEPBSAB Reactive 08/29/2017      Microbiology:  Recent Results (from the past 240 hour(s))  Blood Culture (routine x 2)     Status: None   Collection Time: 08/22/17 12:52 PM  Result Value Ref Range Status   Specimen Description BLOOD RIGHT HAND  Final   Special Requests   Final    BOTTLES DRAWN AEROBIC AND ANAEROBIC Blood Culture adequate volume   Culture   Final    NO GROWTH 5 DAYS Performed at [facility], [address] [place], [place] [address]    Report Status 08/27/2017 FINAL  Final  Urine culture     Status: Abnormal   Collection Time: 08/22/17 12:52 PM  Result Value Ref Range Status   Specimen Description   Final    URINE, RANDOM Performed at  [facility], [address], [place], [place] [address]    Special Requests   Final    NONE Performed at [facility], [address] [place], [place] [address]    Culture (A)  Final    >=100,000 COLONIES/mL KLEBSIELLA PNEUMONIAE 60,000 COLONIES/mL PROTEUS MIRABILIS    Report Status 08/24/2017 FINAL  Final   Organism ID, Bacteria KLEBSIELLA PNEUMONIAE (A)  Final   Organism ID, Bacteria PROTEUS MIRABILIS (A)  Final      Susceptibility   Klebsiella pneumoniae - MIC*    AMPICILLIN RESISTANT Resistant     CEFAZOLIN <=4 SENSITIVE Sensitive     CEFTRIAXONE <=1 SENSITIVE Sensitive     CIPROFLOXACIN <=0.25 SENSITIVE Sensitive     GENTAMICIN <=1 SENSITIVE Sensitive     IMIPENEM <=0.25 SENSITIVE Sensitive     NITROFURANTOIN 64 INTERMEDIATE Intermediate     TRIMETH/SULFA <=20 SENSITIVE Sensitive     AMPICILLIN/SULBACTAM 4 SENSITIVE Sensitive     PIP/TAZO <=4 SENSITIVE Sensitive     Extended ESBL NEGATIVE Sensitive     * >=100,000 COLONIES/mL KLEBSIELLA  PNEUMONIAE   Proteus mirabilis - MIC*    AMPICILLIN <=2 SENSITIVE Sensitive     CEFAZOLIN <=4 SENSITIVE Sensitive     CEFTRIAXONE <=1 SENSITIVE Sensitive     CIPROFLOXACIN <=0.25 SENSITIVE Sensitive     GENTAMICIN <=1 SENSITIVE Sensitive     IMIPENEM 1 SENSITIVE Sensitive     NITROFURANTOIN 128 RESISTANT Resistant     TRIMETH/SULFA <=20 SENSITIVE Sensitive     AMPICILLIN/SULBACTAM <=2 SENSITIVE Sensitive     PIP/TAZO <=4 SENSITIVE Sensitive     * 60,000 COLONIES/mL PROTEUS MIRABILIS  Blood Culture (routine x 2)     Status: None   Collection Time: 08/22/17 12:53 PM  Result Value Ref Range Status   Specimen Description BLOOD RIGHT ARM  Final   Special Requests   Final    BOTTLES DRAWN AEROBIC AND ANAEROBIC Blood Culture results may not be optimal due to an excessive volume of blood received in culture bottles   Culture   Final    NO GROWTH 5 DAYS Performed at [facility], [address] [place], [place] [address]    Report Status 08/27/2017 FINAL  Final  MRSA PCR Screening     Status: None   Collection Time: 08/22/17  9:18 PM  Result Value Ref Range Status   MRSA by PCR NEGATIVE NEGATIVE Final    Comment:        The GeneXpert MRSA Assay (FDA approved for NASAL specimens only), is one component of a comprehensive MRSA colonization surveillance program. It is not intended to diagnose MRSA infection nor to guide or monitor treatment for MRSA infections. Performed at [facility], [address] [place], [place] [address]   Culture, blood (Routine X 2) w Reflex to ID Panel     Status: None (Preliminary result)   Collection Time: 08/29/17  9:31 PM  Result Value Ref Range Status   Specimen Description BLOOD LEFT FOOT  Final   Special Requests   Final    BOTTLES DRAWN AEROBIC AND ANAEROBIC Blood Culture adequate volume   Culture   Final    NO GROWTH 2 DAYS Performed at [facility], [address] [place], [place] [address]    Report Status  PENDING  Incomplete  Culture, blood (Routine X 2) w Reflex to ID Panel     Status: None (Preliminary result)   Collection Time: 08/29/17  9:50 PM  Result Value Ref Range Status   Specimen Description BLOOD RIGHT ANKLE  Final   Special Requests   Final    BOTTLES DRAWN AEROBIC AND ANAEROBIC Blood Culture adequate volume   Culture   Final    NO GROWTH 2 DAYS Performed at [facility], [address], [place], [place] [address]    Report Status PENDING  Incomplete    Coagulation Studies: Recent Labs    08/29/17 06/14/2102  LABPROT 13.9  INR 1.08    Urinalysis: No results for input(s): COLORURINE, LABSPEC, PHURINE, GLUCOSEU, HGBUR, BILIRUBINUR, KETONESUR, PROTEINUR, UROBILINOGEN, NITRITE, LEUKOCYTESUR in the last 72 hours.  Invalid input(s): APPERANCEUR    Imaging: Dg Chest Port 1 View  Result Date: 08/31/2017 CLINICAL DATA:  Respiratory failure. EXAM: PORTABLE CHEST 1 VIEW COMPARISON:  08/29/2017. FINDINGS: Dual-lumen left IJ catheter tip in upper SVC. Right PICC line tip in upper SVC. Cardiomegaly with diffuse interstitial prominence again noted. Bilateral pleural effusions again noted. Findings suggest CHF. Bilateral pneumonitis cannot be excluded. Low lung volumes with bibasilar atelectasis/infiltrates. No pneumothorax. IMPRESSION: 1. Dual-lumen left IJ catheter tip in upper SVC. Right PICC line tip in upper SVC. No pneumothorax. 2. Cardiomegaly with diffuse interstitial prominence bilateral pleural effusions suggesting CHF again noted. Bibasilar atelectasis/infiltrates again noted. Similar findings noted on prior exam. Electronically Signed   By: [place]   On: 08/31/2017 05:53   Dg Chest Port 1 View  Result Date: 08/29/2017 CLINICAL DATA:  Central line placement EXAM: PORTABLE CHEST 1 VIEW COMPARISON:  08/26/2017 FINDINGS: Right-sided PICC line is difficult to visualize centrally but likely similar. A left-sided internal jugular catheter terminates at the mid to low  SVC. Midline trachea. Mild cardiomegaly. Atherosclerosis in the transverse aorta. Small bilateral pleural effusions, similar on the left and new or increased on the right. No pneumothorax. Moderate interstitial edema, increased. Left greater than right base airspace disease, similar. IMPRESSION: Left-sided central line with tip at mid to low SVC; no pneumothorax. Worsened aeration, with progressive interstitial edema and new or enlarging right pleural effusion. A small left pleural effusion is similar. Bibasilar airspace disease which is most likely atelectasis. Concurrent infection or aspiration cannot be excluded. Electronically Signed   By: Abigail Miyamoto M.D.   On: 08/29/2017 14:28     Medications:   . cefTRIAXone (ROCEPHIN)  IV Stopped (08/30/17 1221)  . doxycycline (VIBRAMYCIN) IV Stopped (08/31/17 0005)   . chlorhexidine  15 mL Mouth Rinse BID  . Chlorhexidine Gluconate Cloth  6 each Topical Q0600  . feeding supplement (NEPRO CARB STEADY)  237 mL Oral BID BM  . insulin aspart  0-15 Units Subcutaneous Q4H  . letrozole  2.5 mg Oral Daily  . mouth rinse  15 mL Mouth Rinse q12n4p  . sodium chloride flush  10-40 mL Intracatheter Q12H   acetaminophen **OR** acetaminophen, bisacodyl, heparin lock flush, hydrALAZINE, ipratropium-albuterol, metoprolol tartrate, [DISCONTINUED] ondansetron **OR** ondansetron (ZOFRAN) IV, sodium chloride flush  Assessment/ Plan:  72 y.o. female with diabetes, coronary disease, sleep apnea, history of breast cancer, was admitted on 08/22/2017 with fever, chills, tick bite.   1.  Acute renal failure due to hypotension, sepsis. 2.  Sepsis 3.  Urinary tract infection-Klebsiella, Proteus 4.  Elevated troponin/demand ischemia 5.  Hypotension 6.  Thrombocytopenia 7.  Hyponatremia, Hypokalemia 8.  Lower extremity Edema 9.  Acute respiratory failure, on bipap.  -Patient due for dialysis again today as urine output was in the oliguric range.  Patient tolerated  hemodialysis yesterday well on ultrafiltration achieved  was 1 kg.  We will plan for 1.5 kg of ultrafiltration today as tolerated.  Patient remains on BiPAP but does appear to be a bit more responsive today.  Hopefully treatment of azotemia will continue to improve her underlying mental status.  Overall remains critically ill at the moment however.  Further plan as patient progresses.     LOS: 9 Bonnie Holt 6/18/20199:51 AM  [place], [place]  Note: This note was prepared with Dragon dictation. Any transcription errors are unintentional

## 2017-08-31 NOTE — Evaluation (Signed)
SLP Cancellation Note  Patient Details Name: VIRTIE BUNGERT MRN: [id number] DOB: 1946/02/11   Cancelled treatment:       Reason Eval/Treat Not Completed: Medical issues which prohibited therapy  NSG stated pt is unable to be assessed at this time and has ng tube placement for alternate means for nutrition. SLP will follow up in 2-3 days to assess for possible evaluation.    [place] Sauber 08/31/2017, 12:39 PM

## 2017-08-31 NOTE — Progress Notes (Signed)
Daily Progress Note   Patient Name: Bonnie Holt       Date: 08/31/2017 DOB: March 27, 1945  Age: 72 y.o. MRN#: [id number] Attending Physician: [name], MD Primary Care Physician: Kirk Ruths, MD Admit Date: 08/22/2017  Reason for Consultation/Follow-up: Establishing goals of care  Subjective: Visited patient and husband 2x today. Husband still hopeful for improvement.  He tells me he never expected that his wife may predecease him.  This whole illness has been a complete shock to him.  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   Assessment: Critically ill female, acute renal failure, liver failure and encephalopathy.  Requiring intermittent bipap. Receiving hemodialysis.  N/G in place for feeding.   Hospital day 9.  Vital signs:  Low temp, increased respirations, increase heart rate, BP steady.   Patient Profile/HPI:  72 y.o. female  with past medical history of CAD sp stent placement, BRCA on Femara, and OSA cpap compliant who was admitted on 08/22/2017 with lethargy and edema.  She was found to be in acute renal failure with acidosis.  Her troponin was 2.33 secondary to demand ischemia.  Her lactic acid was 6.81.  She became increasingly anuric and hemodialysis was started on 6/16.  She did not tolerate the first treatment well due to hypotension.  She remains on Bipap with decreased responsiveness.   Length of Stay: 9  Current Medications: Scheduled Meds:  . B-complex with vitamin C  1 tablet Per Tube QHS  . chlorhexidine  15 mL Mouth Rinse BID  . Chlorhexidine Gluconate Cloth  6 each Topical Q0600  . [START ON 09/01/2017] feeding supplement (NEPRO CARB STEADY)  1,000 mL Per Tube Q24H  . feeding  supplement (PRO-STAT SUGAR FREE 64)  30 mL Per Tube Daily  . free water  200 mL Per Tube Q8H  . insulin aspart  0-15 Units Subcutaneous Q4H  . insulin aspart  3 Units Subcutaneous Q4H  . insulin glargine  10 Units Subcutaneous Daily  . letrozole  2.5 mg Oral Daily  . mouth rinse  15 mL Mouth Rinse q12n4p  . sodium chloride flush  10-40 mL Intracatheter Q12H    Continuous Infusions: . cefTRIAXone (ROCEPHIN)  IV Stopped (08/31/17 1205)  .  doxycycline (VIBRAMYCIN) IV Stopped (08/31/17 1250)    PRN Meds: acetaminophen **OR** acetaminophen, bisacodyl, heparin lock flush, hydrALAZINE, ipratropium-albuterol, metoprolol tartrate, [DISCONTINUED] ondansetron **OR** ondansetron (ZOFRAN) IV, sodium chloride flush  Physical Exam      Well developed obese female, flushed, has both purposeful and non-purposeful movements of her arms, N/G in place CV tachycardic Resp rapid breathing, on 6L sats at 87% Extremities clammy with significant edema  Vital Signs: BP (!) 127/55   Pulse (!) 109   Temp 98.8 F (37.1 C)   Resp (!) 33   Ht '5\' 2"'$  (1.575 m)   Wt 106.7 kg (235 lb 3.7 oz)   SpO2 91%   BMI 43.02 kg/m  SpO2: SpO2: 91 % O2 Device: O2 Device: Nasal Cannula O2 Flow Rate: O2 Flow Rate (L/min): 6 L/min  Intake/output summary:   Intake/Output Summary (Last 24 hours) at 08/31/2017 1537 Last data filed at 08/31/2017 1500 Gross per 24 hour  Intake 834.25 ml  Output 220 ml  Net 614.25 ml   LBM: Last BM Date: 08/26/17 Baseline Weight: Weight: 100.2 kg (221 lb) Most recent weight: Weight: 106.7 kg (235 lb 3.7 oz)       Palliative Assessment/Data: 10%    Flowsheet Rows     Most Recent Value  Intake Tab  Referral Department  Hospitalist  Unit at Time of Referral  Med/Surg Unit  Palliative Care Primary Diagnosis  Nephrology  Date Notified  08/28/17  Palliative Care Type  New Palliative care  Reason for referral  Clarify Goals of Care  Date of Admission  08/22/17  Date first seen by  Palliative Care  08/30/17  # of days Palliative referral response time  2 Day(s)  # of days IP prior to Palliative referral  6  Clinical Assessment  Psychosocial & Spiritual Assessment  Palliative Care Outcomes      Patient Active Problem List   Diagnosis Date Noted  . ARF (acute renal failure) ([place])   . Elevated liver function tests   . Palliative care encounter   . Sepsis ([place]) 08/22/2017  . UTI (urinary tract infection) 08/22/2017  . CAD (coronary artery disease) 08/22/2017  . Diabetes ([place]) 08/22/2017  . Primary cancer of upper outer quadrant of left female breast ([place]) 12/06/2016    Palliative Care Plan    Recommendations/Plan:  Continue current care  PMT will follow with you to support the family and guide decisions as the need arises.  Goals of Care and Additional Recommendations:  Limitations on Scope of Treatment: Full Scope Treatment  Code Status:  Full code  Prognosis:   Unable to determine   Discharge Planning:  To Be Determined   Care plan was discussed with husband, CCM, ID  Thank you for allowing the Palliative Medicine Team to assist in the care of this patient.  Total time spent:  60 min.     Greater than 50%  of this time was spent counseling and coordinating care related to the above assessment and plan.  Florentina Jenny, PA-C Palliative Medicine  Please contact Palliative MedicineTeam phone at [phone] for questions and concerns between 7 am - 7 pm.   Please see AMION for individual provider pager numbers.

## 2017-08-31 NOTE — Procedures (Signed)
Endotracheal Intubation: Patient required placement of an artificial airway secondary to Respiratory Failure  Consent: Emergent.   Hand washing performed prior to starting the procedure.   Medications administered for sedation prior to procedure:  Midazolam 4 mg IV,  Vecuronium 10 mg IV, Fentanyl 100 mcg IV.    A time out procedure was called and correct patient, name, & ID confirmed. Needed supplies and equipment were assembled and checked to include ETT, 10 ml syringe, Glidescope, Mac and Miller blades, suction, oxygen and bag mask valve, end tidal CO2 monitor.   Patient was positioned to align the mouth and pharynx to facilitate visualization of the glottis.   Heart rate, SpO2 and blood pressure was continuously monitored during the procedure. Pre-oxygenation was conducted prior to intubation and endotracheal tube was placed through the vocal cords into the trachea.     The artificial airway was placed under direct visualization via glidescope route using a 8.0 ETT on the first attempt.  ETT was secured at 23 cm mark.  Placement was confirmed by auscuitation of lungs with good breath sounds bilaterally and no stomach sounds.  Condensation was noted on endotracheal tube.   Pulse ox 98%.  CO2 detector in place with appropriate color change.   Complications: None .   Operator: Brayan [place]ot[place].   Chest radiograph ordered and pending.    Tyia Binford David Betha Shadix, M.D.   Pulmonary & Critical Care Medicine  Medical Director ICU-[facility] [place] Medical Director [facility] Cardio-Pulmonary Department       

## 2017-08-31 NOTE — Progress Notes (Addendum)
Remains somnolent but able to follow some commands.  Mildly dyspneic appearing on [place] O2.  Facial flushing persists  Vitals:   08/31/17 1330 08/31/17 1400 08/31/17 1430 08/31/17 1500  BP: (!) 131/46 (!) 105/49 (!) 139/57 (!) 127/55  Pulse: (!) 102 (!) 107 (!) 103 (!) 109  Resp: (!) 32 (!) 26 (!) 27 (!) 33  Temp: 98.6 F (37 C) 98.8 F (37.1 C) 98.6 F (37 C) 98.8 F (37.1 C)  TempSrc:      SpO2: 91% 94% 92% 91%  Weight:      Height:       Gen: Somnolent, NAD, sonorous respirations HEENT: NCAT, sclera white, facial flushing Neck: JVP not visualized Lungs: breath sounds full anteriorly without wheezes or other adventitious sounds Cardiovascular: RRR, no murmurs Abdomen: Obese, soft, nontender, normal BS Ext: without clubbing, cyanosis, edema Neuro: CNs intact, MAEs Skin: Limited exam, no lesions noted  I have reviewed the above exam and it is still accurate for today's date    BMP Latest Ref Rng & Units 08/31/2017 08/30/2017 08/29/2017  Glucose 65 - 99 mg/dL 225(H) 215(H) 213(H)  BUN 6 - 20 mg/dL 72(H) 93(H) 91(H)  Creatinine 0.44 - 1.00 mg/dL 7.97(H) 10.20(H) 10.07(H)  Sodium 135 - 145 mmol/L 140 139 133(L)  Potassium 3.5 - 5.1 mmol/L 3.3(L) 3.8 4.3  Chloride 101 - 111 mmol/L 104 103 101  CO2 22 - 32 mmol/L 22 20(L) 15(L)  Calcium 8.9 - 10.3 mg/dL 7.2(L) 7.4(L) 7.7(L)    CBC Latest Ref Rng & Units 08/31/2017 08/29/2017 08/29/2017  WBC 3.6 - 11.0 K/uL 10.2 8.2 8.3  Hemoglobin 12.0 - 16.0 g/dL 10.0(L) 10.4(L) 10.7(L)  Hematocrit 35.0 - 47.0 % 29.1(L) 30.3(L) 31.5(L)  Platelets 150 - 440 K/uL 86(L) 78(L) 63(L)    Hepatic Function Latest Ref Rng & Units 08/31/2017 08/29/2017 08/28/2017  Total Protein 6.5 - 8.1 g/dL 6.3(L) 6.0(L) 5.5(L)  Albumin 3.5 - 5.0 g/dL 2.8(L) 2.7(L) 2.8(L)  AST 15 - 41 U/L 48(H) 69(H) 134(H)  ALT 14 - 54 U/L 11(L) 12(L) 15  Alk Phosphatase 38 - 126 U/L 112 134(H) 127(H)  Total Bilirubin 0.3 - 1.2 mg/dL 0.8 0.6 0.7   Results for orders placed or performed  during the hospital encounter of 08/22/17  Blood Culture (routine x 2)     Status: None   Collection Time: 08/22/17 12:52 PM  Result Value Ref Range Status   Specimen Description BLOOD RIGHT HAND  Final   Special Requests   Final    BOTTLES DRAWN AEROBIC AND ANAEROBIC Blood Culture adequate volume   Culture   Final    NO GROWTH 5 DAYS Performed at [facility], [address] [place], [place] [address]    Report Status 08/27/2017 FINAL  Final  Urine culture     Status: Abnormal   Collection Time: 08/22/17 12:52 PM  Result Value Ref Range Status   Specimen Description   Final    URINE, RANDOM Performed at [facility], [address] [place], [place] [address]    Special Requests   Final    NONE Performed at [facility], [address] [place], [place] [address]    Culture (A)  Final    >=100,000 COLONIES/mL KLEBSIELLA PNEUMONIAE 60,000 COLONIES/mL PROTEUS MIRABILIS    Report Status 08/24/2017 FINAL  Final   Organism ID, Bacteria KLEBSIELLA PNEUMONIAE (A)  Final   Organism ID, Bacteria PROTEUS MIRABILIS (A)  Final      Susceptibility  Klebsiella pneumoniae - MIC*    AMPICILLIN RESISTANT Resistant     CEFAZOLIN <=4 SENSITIVE Sensitive     CEFTRIAXONE <=1 SENSITIVE Sensitive     CIPROFLOXACIN <=0.25 SENSITIVE Sensitive     GENTAMICIN <=1 SENSITIVE Sensitive     IMIPENEM <=0.25 SENSITIVE Sensitive     NITROFURANTOIN 64 INTERMEDIATE Intermediate     TRIMETH/SULFA <=20 SENSITIVE Sensitive     AMPICILLIN/SULBACTAM 4 SENSITIVE Sensitive     PIP/TAZO <=4 SENSITIVE Sensitive     Extended ESBL NEGATIVE Sensitive     * >=100,000 COLONIES/mL KLEBSIELLA PNEUMONIAE   Proteus mirabilis - MIC*    AMPICILLIN <=2 SENSITIVE Sensitive     CEFAZOLIN <=4 SENSITIVE Sensitive     CEFTRIAXONE <=1 SENSITIVE Sensitive     CIPROFLOXACIN <=0.25 SENSITIVE Sensitive     GENTAMICIN <=1 SENSITIVE Sensitive     IMIPENEM 1 SENSITIVE Sensitive     NITROFURANTOIN 128  RESISTANT Resistant     TRIMETH/SULFA <=20 SENSITIVE Sensitive     AMPICILLIN/SULBACTAM <=2 SENSITIVE Sensitive     PIP/TAZO <=4 SENSITIVE Sensitive     * 60,000 COLONIES/mL PROTEUS MIRABILIS  Blood Culture (routine x 2)     Status: None   Collection Time: 08/22/17 12:53 PM  Result Value Ref Range Status   Specimen Description BLOOD RIGHT ARM  Final   Special Requests   Final    BOTTLES DRAWN AEROBIC AND ANAEROBIC Blood Culture results may not be optimal due to an excessive volume of blood received in culture bottles   Culture   Final    NO GROWTH 5 DAYS Performed at [facility], [address] [place], [place] [address]    Report Status 08/27/2017 FINAL  Final  MRSA PCR Screening     Status: None   Collection Time: 08/22/17  9:18 PM  Result Value Ref Range Status   MRSA by PCR NEGATIVE NEGATIVE Final    Comment:        The GeneXpert MRSA Assay (FDA approved for NASAL specimens only), is one component of a comprehensive MRSA colonization surveillance program. It is not intended to diagnose MRSA infection nor to guide or monitor treatment for MRSA infections. Performed at [facility], [address] [place], [place] [address]   Culture, blood (Routine X 2) w Reflex to ID Panel     Status: None (Preliminary result)   Collection Time: 08/29/17  9:31 PM  Result Value Ref Range Status   Specimen Description BLOOD LEFT FOOT  Final   Special Requests   Final    BOTTLES DRAWN AEROBIC AND ANAEROBIC Blood Culture adequate volume   Culture   Final    NO GROWTH 2 DAYS Performed at [facility], [address], [place], [place] [address]    Report Status PENDING  Incomplete  Culture, blood (Routine X 2) w Reflex to ID Panel     Status: None (Preliminary result)   Collection Time: 08/29/17  9:50 PM  Result Value Ref Range Status   Specimen Description BLOOD RIGHT ANKLE  Final   Special Requests   Final    BOTTLES DRAWN AEROBIC AND ANAEROBIC Blood  Culture adequate volume   Culture   Final    NO GROWTH 2 DAYS Performed at [facility], [address] [place], [place] [address]    Report Status PENDING  Incomplete    Anti-infectives (From admission, onward)   Start     Dose/Rate Route Frequency Ordered Stop   08/25/17 1100  cefTRIAXone (ROCEPHIN) 1 g in sodium chloride 0.9 % 100 mL IVPB  Status:  Discontinued     1 g 200 mL/hr over 30 Minutes Intravenous Every 24 hours 08/25/17 1048 08/31/17 1538   08/25/17 1100  doxycycline (VIBRAMYCIN) 100 mg in sodium chloride 0.9 % 250 mL IVPB     100 mg 125 mL/hr over 120 Minutes Intravenous Every 12 hours 08/25/17 1057     08/24/17 1230  ceFAZolin (ANCEF) IVPB 1 g/50 mL premix  Status:  Discontinued     1 g 100 mL/hr over 30 Minutes Intravenous Every 12 hours 08/24/17 1224 08/25/17 1048   08/22/17 2200  piperacillin-tazobactam (ZOSYN) IVPB 3.375 g  Status:  Discontinued     3.375 g 12.5 mL/hr over 240 Minutes Intravenous Every 8 hours 08/22/17 1509 08/24/17 1221   08/22/17 2200  vancomycin (VANCOCIN) IVPB 1000 mg/200 mL premix  Status:  Discontinued     1,000 mg 200 mL/hr over 60 Minutes Intravenous Every 24 hours 08/22/17 1509 08/23/17 2008   08/22/17 1400  piperacillin-tazobactam (ZOSYN) IVPB 3.375 g     3.375 g 100 mL/hr over 30 Minutes Intravenous  Once 08/22/17 1355 08/22/17 1431   08/22/17 1400  vancomycin (VANCOCIN) IVPB 1000 mg/200 mL premix     1,000 mg 200 mL/hr over 60 Minutes Intravenous  Once 08/22/17 1355 08/22/17 1510      CXR: [place] edema pattern with possible LLL infiltrate  IMPRESSION: Severe sepsis of unclear etiology Recent tick bite - concern for ehrlichiosis (serologies negative)  Will consider convalescent titers Probable LLL PNA Acute encephalopathy, relapsing Elevated trop I, likely demand ischemia - resolving Pulmonary edema, resolving Chronic LBBB AKI, now on HD DM 2, controlled Elevated transaminases, resolved Thrombocytopenia,  improving Kelbsiella/proteus UTI - fully treated Malnutrition H/O breast Ca H/O CAD  PLAN/REC: Cont supp O2 to maintain SpO2 > 90% Cont BiPAP as needed Cardiology following - will need ischemia eval after recovers from this acute illness Monitor BMET intermittently Monitor I/Os Correct electrolytes as indicated HD per Nephrology again today Monitor temp, WBC count Micro and abx as above ID Service recommends completing 10 days of doxycycline.   Ceftriaxone discontinued 6/18 DVT px: SCDs Monitor CBC intermittently Transfuse per usual guidelines AM cortisol, free T4, free T3 ordered for 6/19 NGT placed 6/18 and TF protocol initiated  Husband updated @ bedside  Merton Border, MD PCCM service Mobile [phone] Pager [phone] 08/31/2017 4:09 PM

## 2017-08-31 NOTE — Progress Notes (Signed)
Pt reaching for ETT with B UE.  Mitts applied.

## 2017-08-31 NOTE — Progress Notes (Signed)
CC  severe resp failure  HPI I was called to bedside, patient with increased WOB and using accessory muscles to breathe Placed on biPAP fio2 at 70% Severe encephalopathy  BP (!) 150/54   Pulse (!) 103   Temp 99.1 F (37.3 C)   Resp (!) 31   Ht '5\' 2"'$  (1.575 m)   Wt 235 lb 3.7 oz (106.7 kg)   SpO2 91%   BMI 43.02 kg/m   PHYSICAL EXAMINATION:  GENERAL:critically ill appearing, +resp distress HEAD: Normocephalic, atraumatic.  EYES: Pupils equal, round, reactive to light.  No scleral icterus.  MOUTH: Moist mucosal membrane. NECK: Supple. No thyromegaly. No nodules. No JVD.  PULMONARY: +rhonchi, +wheezing CARDIOVASCULAR: S1 and S2. Regular rate and rhythm. No murmurs, rubs, or gallops.  GASTROINTESTINAL: Soft, nontender, -distended. No masses. Positive bowel sounds. No hepatosplenomegaly.  MUSCULOSKELETAL: No swelling, clubbing, or edema.  NEUROLOGIC: obtunded SKIN:intact,warm,dry   ROS unobtainable due to critical illness Husband at bedside updated and notified of critical illness Patient was emergently intubated, near cardiac arrest.  A/P 72 yo morbidly obese WF with severe sepsis from Ehrlichiosis with multiorgan failure with progressive resp failure with severe encephalopathy  Severe Hypoxic and Hypercapnic Respiratory Failure from multiorgan failure/pulm edema -continue Full MV support -continue Bronchodilator Therapy -Wean Fio2 and PEEP as tolerated -will perform SAT/SBT when respiratory parameters are met  Renal Failure-most likely due to ATN -follow chem 7 -follow UO -HD as per nephroology   NEUROLOGY - intubated and sedated - minimal sedation to achieve a RASS goal: -1   Critical Care Time devoted to patient care services described in this note is 45  minutes.   Overall, patient is critically ill, prognosis is guarded.  Patient with Multiorgan failure and at high risk for cardiac arrest and death.    Corrin Parker, M.D.  [facility]  Medical Director [place] Director [facility] Cardio-Pulmonary Department

## 2017-09-01 ENCOUNTER — Inpatient Hospital Stay: Payer: PPO

## 2017-09-01 DIAGNOSIS — I4891 Unspecified atrial fibrillation: Secondary | ICD-10-CM

## 2017-09-01 DIAGNOSIS — Z7189 Other specified counseling: Secondary | ICD-10-CM

## 2017-09-01 DIAGNOSIS — J9690 Respiratory failure, unspecified, unspecified whether with hypoxia or hypercapnia: Secondary | ICD-10-CM

## 2017-09-01 DIAGNOSIS — N170 Acute kidney failure with tubular necrosis: Secondary | ICD-10-CM

## 2017-09-01 DIAGNOSIS — G9340 Encephalopathy, unspecified: Secondary | ICD-10-CM

## 2017-09-01 LAB — COMPREHENSIVE METABOLIC PANEL
ALK PHOS: 110 U/L (ref 38–126)
ALT: 8 U/L — ABNORMAL LOW (ref 14–54)
AST: 40 U/L (ref 15–41)
Albumin: 2.9 g/dL — ABNORMAL LOW (ref 3.5–5.0)
Anion gap: 12 (ref 5–15)
BUN: 60 mg/dL — AB (ref 6–20)
CALCIUM: 7.8 mg/dL — AB (ref 8.9–10.3)
CHLORIDE: 104 mmol/L (ref 101–111)
CO2: 26 mmol/L (ref 22–32)
CREATININE: 6.14 mg/dL — AB (ref 0.44–1.00)
GFR calc Af Amer: 7 mL/min — ABNORMAL LOW (ref >60)
GFR calc non Af Amer: 6 mL/min — ABNORMAL LOW (ref >60)
Glucose, Bld: 124 mg/dL — ABNORMAL HIGH (ref 65–99)
Potassium: 3.4 mmol/L — ABNORMAL LOW (ref 3.5–5.1)
SODIUM: 142 mmol/L (ref 135–145)
Total Bilirubin: 0.8 mg/dL (ref 0.3–1.2)
Total Protein: 6.7 g/dL (ref 6.5–8.1)

## 2017-09-01 LAB — CBC
HCT: 26.9 % — ABNORMAL LOW (ref 35.0–47.0)
HEMOGLOBIN: 9 g/dL — AB (ref 12.0–16.0)
MCH: 30.6 pg (ref 26.0–34.0)
MCHC: 33.4 g/dL (ref 32.0–36.0)
MCV: 91.7 fL (ref 80.0–100.0)
PLATELETS: 108 10*3/uL — AB (ref 150–440)
RBC: 2.94 MIL/uL — AB (ref 3.80–5.20)
RDW: 15.9 % — ABNORMAL HIGH (ref 11.5–14.5)
WBC: 13.6 10*3/uL — AB (ref 3.6–11.0)

## 2017-09-01 LAB — T4, FREE: Free T4: 0.61 ng/dL — ABNORMAL LOW (ref 0.82–1.77)

## 2017-09-01 LAB — PROCALCITONIN: Procalcitonin: 1.37 ng/mL

## 2017-09-01 LAB — GLUCOSE, CAPILLARY
Glucose-Capillary: 118 mg/dL — ABNORMAL HIGH (ref 65–99)
Glucose-Capillary: 126 mg/dL — ABNORMAL HIGH (ref 65–99)
Glucose-Capillary: 132 mg/dL — ABNORMAL HIGH (ref 65–99)
Glucose-Capillary: 134 mg/dL — ABNORMAL HIGH (ref 65–99)
Glucose-Capillary: 152 mg/dL — ABNORMAL HIGH (ref 65–99)
Glucose-Capillary: 177 mg/dL — ABNORMAL HIGH (ref 65–99)
Glucose-Capillary: 181 mg/dL — ABNORMAL HIGH (ref 65–99)

## 2017-09-01 LAB — TROPONIN I: Troponin I: 0.28 ng/mL (ref <0.03)

## 2017-09-01 LAB — CORTISOL: Cortisol, Plasma: 26.4 ug/dL

## 2017-09-01 LAB — PHOSPHORUS: Phosphorus: 3.9 mg/dL (ref 2.5–4.6)

## 2017-09-01 LAB — TRIGLYCERIDES: TRIGLYCERIDES: 108 mg/dL (ref <150)

## 2017-09-01 MED ORDER — AMIODARONE IV BOLUS ONLY 150 MG/100ML
INTRAVENOUS | Status: AC
  Administered 2017-09-01: 150 mg via INTRAVENOUS
  Filled 2017-09-01: qty 100

## 2017-09-01 MED ORDER — AMIODARONE HCL IN DEXTROSE 360-4.14 MG/200ML-% IV SOLN
30.0000 mg/h | INTRAVENOUS | Status: AC
  Administered 2017-09-01 – 2017-09-07 (×13): 30 mg/h via INTRAVENOUS
  Filled 2017-09-01 (×13): qty 200

## 2017-09-01 MED ORDER — ACETAMINOPHEN 325 MG PO TABS
650.0000 mg | ORAL_TABLET | Freq: Four times a day (QID) | ORAL | Status: DC | PRN
  Administered 2017-09-02 – 2017-09-14 (×7): 650 mg
  Filled 2017-09-01 (×7): qty 2

## 2017-09-01 MED ORDER — NEPRO/CARBSTEADY PO LIQD
1000.0000 mL | ORAL | Status: DC
  Administered 2017-09-01: 1000 mL

## 2017-09-01 MED ORDER — AMIODARONE HCL IN DEXTROSE 360-4.14 MG/200ML-% IV SOLN
INTRAVENOUS | Status: AC
  Administered 2017-09-01: 60 mg/h via INTRAVENOUS
  Filled 2017-09-01: qty 200

## 2017-09-01 MED ORDER — FENTANYL CITRATE (PF) 100 MCG/2ML IJ SOLN
25.0000 ug | INTRAMUSCULAR | Status: DC | PRN
  Administered 2017-09-01 – 2017-09-02 (×3): 25 ug via INTRAVENOUS
  Filled 2017-09-01 (×4): qty 2

## 2017-09-01 MED ORDER — CHLORHEXIDINE GLUCONATE 0.12% ORAL RINSE (MEDLINE KIT)
15.0000 mL | Freq: Two times a day (BID) | OROMUCOSAL | Status: DC
  Administered 2017-09-01 – 2017-09-02 (×2): 15 mL via OROMUCOSAL

## 2017-09-01 MED ORDER — AMIODARONE IV BOLUS ONLY 150 MG/100ML
150.0000 mg | Freq: Once | INTRAVENOUS | Status: AC
  Administered 2017-09-01: 150 mg via INTRAVENOUS

## 2017-09-01 MED ORDER — ACETAMINOPHEN 650 MG RE SUPP: 650.0000 mg | Freq: Four times a day (QID) | RECTAL | Status: DC | PRN

## 2017-09-01 MED ORDER — ORAL CARE MOUTH RINSE: 15.0000 mL | OROMUCOSAL | Status: DC

## 2017-09-01 MED ORDER — FENTANYL CITRATE (PF) 100 MCG/2ML IJ SOLN
INTRAMUSCULAR | Status: AC
  Administered 2017-09-01: 50 ug via INTRAVENOUS
  Filled 2017-09-01: qty 2

## 2017-09-01 MED ORDER — AMIODARONE HCL IN DEXTROSE 360-4.14 MG/200ML-% IV SOLN
60.0000 mg/h | INTRAVENOUS | Status: AC
  Administered 2017-09-01: 60 mg/h via INTRAVENOUS
  Filled 2017-09-01: qty 200

## 2017-09-01 MED ORDER — DEXMEDETOMIDINE HCL IN NACL 400 MCG/100ML IV SOLN
INTRAVENOUS | Status: AC
  Administered 2017-09-01: 1 ug/kg/h via INTRAVENOUS
  Filled 2017-09-01: qty 100

## 2017-09-01 MED ORDER — PRO-STAT SUGAR FREE PO LIQD: 30.0000 mL | Freq: Every day | ORAL | Status: DC

## 2017-09-01 MED ORDER — PRO-STAT SUGAR FREE PO LIQD
30.0000 mL | Freq: Every day | ORAL | Status: DC
  Administered 2017-09-02 – 2017-09-04 (×3): 30 mL

## 2017-09-01 MED ORDER — DEXMEDETOMIDINE HCL IN NACL 400 MCG/100ML IV SOLN
0.0000 ug/kg/h | INTRAVENOUS | Status: DC
  Administered 2017-09-01: 1 ug/kg/h via INTRAVENOUS
  Administered 2017-09-01 (×2): 0.8 ug/kg/h via INTRAVENOUS
  Administered 2017-09-02: 1.2 ug/kg/h via INTRAVENOUS
  Administered 2017-09-02 (×2): 1 ug/kg/h via INTRAVENOUS
  Filled 2017-09-01 (×6): qty 100

## 2017-09-01 MED ORDER — PHENYLEPHRINE HCL 10 MG/ML IJ SOLN
0.0000 ug/min | INTRAMUSCULAR | Status: DC
  Administered 2017-09-01: 20 ug/min via INTRAVENOUS
  Filled 2017-09-01: qty 40

## 2017-09-01 MED ORDER — PANTOPRAZOLE SODIUM 40 MG PO PACK
40.0000 mg | PACK | Freq: Every day | ORAL | Status: DC
  Administered 2017-09-01 – 2017-09-04 (×4): 40 mg
  Filled 2017-09-01 (×4): qty 20

## 2017-09-01 MED ORDER — FENTANYL CITRATE (PF) 100 MCG/2ML IJ SOLN
50.0000 ug | Freq: Once | INTRAMUSCULAR | Status: AC
  Administered 2017-09-01: 50 ug via INTRAVENOUS

## 2017-09-01 NOTE — Progress Notes (Signed)
HD tx start    09/01/17 0233  Vital Signs  Temp 99.5 F (37.5 C)  Temp Source Rectal  Pulse Rate 99  Pulse Rate Source Monitor  Resp 16  BP (!) 115/53  BP Location Right Leg  BP Method Automatic  Patient Position (if appropriate) Lying  Oxygen Therapy  SpO2 96 %  O2 Device Ventilator  End Tidal CO2 (EtCO2) 39  During Hemodialysis Assessment  Blood Flow Rate (mL/min) 300 mL/min  Arterial Pressure (mmHg) -80 mmHg  Venous Pressure (mmHg) 80 mmHg  Transmembrane Pressure (mmHg) 70 mmHg  Ultrafiltration Rate (mL/min) 500 mL/min  Dialysate Flow Rate (mL/min) 600 ml/min  Conductivity: Machine  14.1  HD Safety Checks Performed Yes  Dialysis Fluid Bolus Normal Saline  Bolus Amount (mL) 250 mL  Intra-Hemodialysis Comments Tx initiated  Hemodialysis Catheter Left Internal jugular Triple-lumen  Placement Date/Time: 08/29/17 1400   Time Out: Correct patient;Correct site;Correct procedure  Maximum sterile barrier precautions: Hand hygiene;Sterile gloves;Cap;Large sterile sheet;Mask;Sterile gown  Site Prep: Chlorhexidine  Local Anesthetic: Inje...  Blue Lumen Status Infusing  Red Lumen Status Infusing

## 2017-09-01 NOTE — Progress Notes (Signed)
This note also relates to the following rows which could not be included: Temp - Cannot attach notes to unvalidated device data Pulse Rate - Cannot attach notes to unvalidated device data Resp - Cannot attach notes to unvalidated device data BP - Cannot attach notes to unvalidated device data  Hd completed  

## 2017-09-01 NOTE — Progress Notes (Signed)
Daily Progress Note   Patient Name: Bonnie Holt       Date: 09/01/2017 DOB: [birth year]  Age: 72 y.o. MRN#: [id number] Attending Physician: [name], MD Primary Care Physician: Kirk Ruths, MD Admit Date: 08/22/2017  Reason for Consultation/Follow-up: Establishing goals of care and Psychosocial/spiritual support  Subjective: Patient intubated.  Spoke at length with husband.  His wife completed (62?) radiation treatments in January 2019 for invasive breast cancer diagnosed in 11/2016.   She has multiple stents placed in her heart.  She was religious about using her BiPap for OSA and could not sleep without it.  I suggested that perhaps her body was weaker from these things than we realized.     Marvin relayed Dr. Alva Garnet' information from this morning to me - we are hoping for significant improvement by this weekend.  If not,  a 1 way extubation may be considered early next week.  He appreciates the excellent medical care and nursing care that Macie is receiving.  He is still in disbelief that this is happening.  Then we talked about the couple's meeting when she was 43 and he was 72 years old, [name] shared a lot of wonderful memories.    Assessment: 72 yo female with hx of CAD s/p stents, BRCA, OSA, admitted with severe sepsis, acute renal failure, UTI, respiratory failure, demand ischemia, and severe encephalopathy.  She is receiving hemodialysis (started 6/17) her respiratory status declined overnight (6/18) and she required intubation and pressors.  She will undergo MRI today as her encephalopathy had not significantly improved.   Working diagnosis is severe sepsis secondary to ehrlichiosis.  Per ID serologies may take weeks to turn positive.   Length of Stay: 10  Current  Medications: Scheduled Meds:  . B-complex with vitamin C  1 tablet Per Tube QHS  . chlorhexidine  15 mL Mouth Rinse BID  . Chlorhexidine Gluconate Cloth  6 each Topical Q0600  . feeding supplement (NEPRO CARB STEADY)  1,000 mL Per Tube Q24H  . feeding supplement (PRO-STAT SUGAR FREE 64)  30 mL Per Tube Daily  . free water  200 mL Per Tube Q8H  . insulin aspart  0-15 Units Subcutaneous Q4H  . insulin aspart  3 Units Subcutaneous Q4H  . insulin glargine  10 Units Subcutaneous Daily  .  letrozole  2.5 mg Oral Daily  . mouth rinse  15 mL Mouth Rinse 10 times per day  . pantoprazole sodium  40 mg Per Tube Daily  . sodium chloride flush  10-40 mL Intracatheter Q12H    Continuous Infusions: . doxycycline (VIBRAMYCIN) IV Stopped (08/31/17 2356)  . norepinephrine (LEVOPHED) Adult infusion 1 mcg/min (09/01/17 0900)  . propofol (DIPRIVAN) infusion Stopped (09/01/17 0845)    PRN Meds: acetaminophen **OR** acetaminophen, bisacodyl, fentaNYL (SUBLIMAZE) injection, fentaNYL (SUBLIMAZE) injection, heparin lock flush, hydrALAZINE, ipratropium-albuterol, metoprolol tartrate, [DISCONTINUED] ondansetron **OR** ondansetron (ZOFRAN) IV, sodium chloride flush  Physical Exam       Well developed obese female, intubated, sedated CV very distant heart sounds, regular Resp intubated, no distress Abdomen soft, nt, nd Extremitis swelling improved today.  Vital Signs: BP (!) 113/50   Pulse 98   Temp 99.5 F (37.5 C)   Resp 20   Ht 5' 2" (1.575 m)   Wt 109 kg (240 lb 4.8 oz)   SpO2 97%   BMI 43.95 kg/m  SpO2: SpO2: 97 % O2 Device: O2 Device: Ventilator O2 Flow Rate: O2 Flow Rate (L/min): 6 L/min  Intake/output summary:   Intake/Output Summary (Last 24 hours) at 09/01/2017 0936 Last data filed at 09/01/2017 0600 Gross per 24 hour  Intake 1297.44 ml  Output 1154 ml  Net 143.44 ml   LBM: Last BM Date: 08/26/17 Baseline Weight: Weight: 100.2 kg (221 lb) Most recent weight: Weight: 109 kg (240 lb  4.8 oz)       Palliative Assessment/Data: 10%    Flowsheet Rows     Most Recent Value  Intake Tab  Referral Department  Hospitalist  Unit at Time of Referral  Med/Surg Unit  Palliative Care Primary Diagnosis  Nephrology  Date Notified  08/28/17  Palliative Care Type  New Palliative care  Reason for referral  Clarify Goals of Care  Date of Admission  08/22/17  Date first seen by Palliative Care  08/30/17  # of days Palliative referral response time  2 Day(s)  # of days IP prior to Palliative referral  6  Clinical Assessment  Psychosocial & Spiritual Assessment  Palliative Care Outcomes      Patient Active Problem List   Diagnosis Date Noted  . Severe sepsis (HCC)   . Acute respiratory failure with hypoxia (HCC)   . ARF (acute renal failure) (HCC)   . Elevated liver function tests   . Encounter for intubation   . Sepsis (HCC) 08/22/2017  . UTI (urinary tract infection) 08/22/2017  . CAD (coronary artery disease) 08/22/2017  . Diabetes (HCC) 08/22/2017  . 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    Provided [place] Booklet which Marvin committed to read.  Marvin accepts the direction from CCM.  If she does not improve significantly we may face a 1 way extubation over the weekend or early next week.     Goals of Care and Additional Recommendations:  Limitations on Scope of Treatment: Full Scope Treatment  Code Status:  Full code  Prognosis:   Unable to determine  Concerned for poor prognosis.     Discharge Planning:  To Be Determined  Hospital death would not be surprising, but still hopeful for improvement.  Care plan was discussed with CCM and husband  Thank you for allowing the   Palliative Medicine Team to assist in the care of this patient.  Total time spent:  60 min.     Greater  than 50%  of this time was spent counseling and coordinating care related to the above assessment and plan.  Florentina Jenny, PA-C Palliative Medicine  Please contact Palliative MedicineTeam phone at [phone] for questions and concerns between 7 am - 7 pm.   Please see AMION for individual provider pager numbers.

## 2017-09-01 NOTE — Progress Notes (Signed)
[place] INFECTIOUS DISEASE PROGRESS NOTE Date of Admission:  08/22/2017     ID: Bonnie Holt is a 72 y.o. female with Fevers, AMS  Principal Problem:   Sepsis ([place]) Active Problems:   UTI (urinary tract infection)   CAD (coronary artery disease)   Diabetes ([place])   ARF (acute renal failure) (HCC)   Elevated liver function tests   Encounter for intubation   Severe sepsis ([place])   Acute respiratory failure with hypoxia (HCC)   Subjective: Intubated and on pressors.  Had HD last night and this am.   ROS  Unable to obtain  Medications:  Antibiotics Given (last 72 hours)    Date/Time Action Medication Dose Rate   08/29/17 2232 New Bag/Given   doxycycline (VIBRAMYCIN) 100 mg in sodium chloride 0.9 % 250 mL IVPB 100 mg 125 mL/hr   08/30/17 1151 New Bag/Given   cefTRIAXone (ROCEPHIN) 1 g in sodium chloride 0.9 % 100 mL IVPB 1 g 200 mL/hr   08/30/17 1239 New Bag/Given   doxycycline (VIBRAMYCIN) 100 mg in sodium chloride 0.9 % 250 mL IVPB 100 mg 125 mL/hr   08/30/17 2205 New Bag/Given   doxycycline (VIBRAMYCIN) 100 mg in sodium chloride 0.9 % 250 mL IVPB 100 mg 125 mL/hr   08/31/17 1059 New Bag/Given   cefTRIAXone (ROCEPHIN) 1 g in sodium chloride 0.9 % 100 mL IVPB 1 g 200 mL/hr   08/31/17 1059 New Bag/Given   doxycycline (VIBRAMYCIN) 100 mg in sodium chloride 0.9 % 250 mL IVPB 100 mg 125 mL/hr   08/31/17 2138 New Bag/Given   doxycycline (VIBRAMYCIN) 100 mg in sodium chloride 0.9 % 250 mL IVPB 100 mg 125 mL/hr   09/01/17 1200 New Bag/Given   doxycycline (VIBRAMYCIN) 100 mg in sodium chloride 0.9 % 250 mL IVPB 100 mg 125 mL/hr     . B-complex with vitamin C  1 tablet Per Tube QHS  . chlorhexidine  15 mL Mouth Rinse BID  . Chlorhexidine Gluconate Cloth  6 each Topical Q0600  . feeding supplement (NEPRO CARB STEADY)  1,000 mL Per Tube Q24H  . feeding supplement (PRO-STAT SUGAR FREE 64)  30 mL Per Tube Daily  . free water  200 mL Per Tube Q8H  . insulin aspart  0-15 Units  Subcutaneous Q4H  . insulin aspart  3 Units Subcutaneous Q4H  . insulin glargine  10 Units Subcutaneous Daily  . mouth rinse  15 mL Mouth Rinse 10 times per day  . pantoprazole sodium  40 mg Per Tube Daily  . sodium chloride flush  10-40 mL Intracatheter Q12H    Objective: Vital signs in last 24 hours: Temp:  [98.2 F (36.8 C)-99.7 F (37.6 C)] 99.3 F (37.4 C) (06/19 1330) Pulse Rate:  [53-136] 98 (06/19 1330) Resp:  [8-33] 22 (06/19 1330) BP: (41-205)/(20-87) 94/53 (06/19 1330) SpO2:  [74 %-100 %] 99 % (06/19 1330) FiO2 (%):  [40 %-60 %] 40 % (06/19 1132) Weight:  [109 kg (240 lb 4.8 oz)-110.3 kg (243 lb 2.7 oz)] 109 kg (240 lb 4.8 oz) (06/19 0549) Constitutional: confused, intubated HENT: [place]/AT, PERRLA, no scleral icterus Mouth/Throat: Oropharynx is clear and dry. No oropharyngeal exudate.  Cardiovascular: Normal rate, regular rhythm and normal heart sounds.  Pulmonary/Chest: Effort normal and breath sounds normal. No respiratory distress.  has no wheezes.  Neck = supple, no nuchal rigidity Abdominal: Soft. Bowel sounds are normal.  exhibits no distension. There is no tenderness.  Lymphadenopathy: no cervical adenopathy. No axillary adenopathy  Neurological: confused, asteresix Skin: Skin is warm and dry. No rash noted. No erythema.  Psychiatric: confused  Lab Results Recent Labs    08/31/17 0400 09/01/17 0310  WBC 10.2 13.6*  HGB 10.0* 9.0*  HCT 29.1* 26.9*  NA 140 142  K 3.3* 3.4*  CL 104 104  CO2 22 26  BUN 72* 60*  CREATININE 7.97* 6.14*    Microbiology: Results for orders placed or performed during the hospital encounter of 08/22/17  Blood Culture (routine x 2)     Status: None   Collection Time: 08/22/17 12:52 PM  Result Value Ref Range Status   Specimen Description BLOOD RIGHT HAND  Final   Special Requests   Final    BOTTLES DRAWN AEROBIC AND ANAEROBIC Blood Culture adequate volume   Culture   Final    NO GROWTH 5 DAYS Performed at [facility], [address] [place], [place] [address]    Report Status 08/27/2017 FINAL  Final  Urine culture     Status: Abnormal   Collection Time: 08/22/17 12:52 PM  Result Value Ref Range Status   Specimen Description   Final    URINE, RANDOM Performed at [facility], [address], [place], [place] [address]    Special Requests   Final    NONE Performed at [facility], [address] [place], [place] [address]    Culture (A)  Final    >=100,000 COLONIES/mL KLEBSIELLA PNEUMONIAE 60,000 COLONIES/mL PROTEUS MIRABILIS    Report Status 08/24/2017 FINAL  Final   Organism ID, Bacteria KLEBSIELLA PNEUMONIAE (A)  Final   Organism ID, Bacteria PROTEUS MIRABILIS (A)  Final      Susceptibility   Klebsiella pneumoniae - MIC*    AMPICILLIN RESISTANT Resistant     CEFAZOLIN <=4 SENSITIVE Sensitive     CEFTRIAXONE <=1 SENSITIVE Sensitive     CIPROFLOXACIN <=0.25 SENSITIVE Sensitive     GENTAMICIN <=1 SENSITIVE Sensitive     IMIPENEM <=0.25 SENSITIVE Sensitive     NITROFURANTOIN 64 INTERMEDIATE Intermediate     TRIMETH/SULFA <=20 SENSITIVE Sensitive     AMPICILLIN/SULBACTAM 4 SENSITIVE Sensitive     PIP/TAZO <=4 SENSITIVE Sensitive     Extended ESBL NEGATIVE Sensitive     * >=100,000 COLONIES/mL KLEBSIELLA PNEUMONIAE   Proteus mirabilis - MIC*    AMPICILLIN <=2 SENSITIVE Sensitive     CEFAZOLIN <=4 SENSITIVE Sensitive     CEFTRIAXONE <=1 SENSITIVE Sensitive     CIPROFLOXACIN <=0.25 SENSITIVE Sensitive     GENTAMICIN <=1 SENSITIVE Sensitive     IMIPENEM 1 SENSITIVE Sensitive     NITROFURANTOIN 128 RESISTANT Resistant     TRIMETH/SULFA <=20 SENSITIVE Sensitive     AMPICILLIN/SULBACTAM <=2 SENSITIVE Sensitive     PIP/TAZO <=4 SENSITIVE Sensitive     * 60,000 COLONIES/mL PROTEUS MIRABILIS  Blood Culture (routine x 2)     Status: None   Collection Time: 08/22/17 12:53 PM  Result Value Ref Range Status   Specimen Description BLOOD RIGHT ARM  Final   Special  Requests   Final    BOTTLES DRAWN AEROBIC AND ANAEROBIC Blood Culture results may not be optimal due to an excessive volume of blood received in culture bottles   Culture   Final    NO GROWTH 5 DAYS Performed at [facility], [address] [place], [place] [address]    Report Status 08/27/2017 FINAL  Final  MRSA PCR Screening     Status: None  Collection Time: 08/22/17  9:18 PM  Result Value Ref Range Status   MRSA by PCR NEGATIVE NEGATIVE Final    Comment:        The GeneXpert MRSA Assay (FDA approved for NASAL specimens only), is one component of a comprehensive MRSA colonization surveillance program. It is not intended to diagnose MRSA infection nor to guide or monitor treatment for MRSA infections. Performed at [facility], [address] [place], [place] [address]   Culture, blood (Routine X 2) w Reflex to ID Panel     Status: None (Preliminary result)   Collection Time: 08/29/17  9:31 PM  Result Value Ref Range Status   Specimen Description BLOOD LEFT FOOT  Final   Special Requests   Final    BOTTLES DRAWN AEROBIC AND ANAEROBIC Blood Culture adequate volume   Culture   Final    NO GROWTH 3 DAYS Performed at [facility], [address] [place], [place] [address]    Report Status PENDING  Incomplete  Culture, blood (Routine X 2) w Reflex to ID Panel     Status: None (Preliminary result)   Collection Time: 08/29/17  9:50 PM  Result Value Ref Range Status   Specimen Description BLOOD RIGHT ANKLE  Final   Special Requests   Final    BOTTLES DRAWN AEROBIC AND ANAEROBIC Blood Culture adequate volume   Culture   Final    NO GROWTH 3 DAYS Performed at [facility], [address], [place], [place] [address]    Report Status PENDING  Incomplete     Studies/Results: Dg Abd 1 View  Result Date: 08/31/2017 CLINICAL DATA:  Nasogastric tube placement. EXAM: ABDOMEN - 1 VIEW COMPARISON:  None. FINDINGS: The bowel gas pattern is  normal. Distal tip of feeding tube is seen in expected position of stomach. No radio-opaque calculi or other significant radiographic abnormality are seen. IMPRESSION: No evidence of bowel obstruction or ileus. Distal tip of feeding tube seen in expected position of stomach. Electronically Signed   By: Marijo Conception, M.D.   On: 08/31/2017 12:12   Dg Chest Port 1 View  Result Date: 08/31/2017 CLINICAL DATA:  Intubation, history breast cancer, coronary artery disease, diabetes mellitus, hypertension EXAM: PORTABLE CHEST 1 VIEW COMPARISON:  Portable exam 1709 hours compared to 0341 hours FINDINGS: Tip of endotracheal tube is at the carina recommend withdrawal 2.0-2.5 cm. Nasogastric tube extends into stomach. RIGHT arm PICC line tip projects over SVC. LEFT jugular central venous catheter tip projects over SVC. Numerous EKG leads project over chest. Enlargement of cardiac silhouette. Stable mediastinal contours and pulmonary vascularity. Question minimal pulmonary edema with bibasilar effusions and atelectasis. No pneumothorax. IMPRESSION: Probable bibasilar effusions and atelectasis. Tip of endotracheal tube is at the carina, recommend withdrawal 2.0-2.5 cm. Findings called to patient's nurse Bonnie Holt in ICU on 08/31/2017 at 1738 hrs. Electronically Signed   By: Lavonia Dana M.D.   On: 08/31/2017 17:39   Dg Chest Port 1 View  Result Date: 08/31/2017 CLINICAL DATA:  Respiratory failure. EXAM: PORTABLE CHEST 1 VIEW COMPARISON:  08/29/2017. FINDINGS: Dual-lumen left IJ catheter tip in upper SVC. Right PICC line tip in upper SVC. Cardiomegaly with diffuse interstitial prominence again noted. Bilateral pleural effusions again noted. Findings suggest CHF. Bilateral pneumonitis cannot be excluded. Low lung volumes with bibasilar atelectasis/infiltrates. No pneumothorax. IMPRESSION: 1. Dual-lumen left IJ catheter tip in upper SVC. Right PICC line tip in upper SVC. No pneumothorax. 2. Cardiomegaly with diffuse  interstitial  prominence bilateral pleural effusions suggesting CHF again noted. Bibasilar atelectasis/infiltrates again noted. Similar findings noted on prior exam. Electronically Signed   By: Raymond   On: 08/31/2017 05:53    Assessment/Plan: Bonnie Holt is a 72 y.o. female admitted with  fevers, chills, unsteady gait x 3 days.  Had recent tick bite. Admitted with sepsis and started IV abx. Developed progressive SOB and transferred to ICU.  She has had recurrent fevers 6/9-6/12 but now deferevesicing. On admit wbc was 3.5, plts 55, UA 0-5 wbc. cr elevated and had continued to increase.  CXR was negative on admit but then developed pulm edema, CT head neg. Echo with ef 50-55 but poor study.  I suspect she has Ehrlichia or RMSF.   Renal failure is out of proportion but likely multifactorial.  6/17- Much more altered. RUQ US showed fatty liver and stone in gb neck but no cholecystitis.  AST ALT improving. Alk phos went up a little. Started HD.  Doxy day 6, day 6 of ctx as well. I think her confusion is likely from uremia.  6/18 cr down a little. More normothermic. Ehrlichia negative. Day 10 of admisison. Day 7 or doxy and ctx. 6/19 - intubated, no fevers, wbc up some. Had 2 more HD sessions. Plts imrpoving. CXR with fluid and CM.  Recommendation Cont doxy for 10 days today MRI brain is pending. Thank you very much for the consult. Will follow with you.  Leonel Ramsay   09/01/2017, 3:22 PM

## 2017-09-01 NOTE — Progress Notes (Signed)
MRI complete.  VSS. Less agitation. Return to ICU with RN, RT, orderly. Pt on vent and cardiac monitor

## 2017-09-01 NOTE — Progress Notes (Addendum)
Intubated last night for worsening mental status and increased respiratory distress.  Presently sedated on propofol.  Not F/C.  Developed AF RVR after MRI today.  Converted with amiodarone.   Vitals:   09/01/17 1510 09/01/17 1515 09/01/17 1520 09/01/17 1610  BP: 120/68 132/71    Pulse: (!) 101 (!) 178 70   Resp: (!) 25 (!) 22 (!) 25   Temp:      TempSrc:      SpO2: 95% 98% 94% 94%  Weight:      Height:       Gen: Intubated, sedated, not F/C  HEENT: NCAT, sclera white, facial flushing persists Neck: JVP not visualized Lungs: breath sounds full anteriorly without wheezes or other adventitious sounds Cardiovascular: RRR, no murmurs Abdomen: Obese, soft, nontender, normal BS Ext: without clubbing, cyanosis, edema Neuro: CNs intact, MAEs Skin: Limited exam, no lesions noted  The above documented exam has been reviewed entirely and remains accurate    BMP Latest Ref Rng & Units 09/01/2017 08/31/2017 08/30/2017  Glucose 65 - 99 mg/dL 124(H) 225(H) 215(H)  BUN 6 - 20 mg/dL 60(H) 72(H) 93(H)  Creatinine 0.44 - 1.00 mg/dL 6.14(H) 7.97(H) 10.20(H)  Sodium 135 - 145 mmol/L 142 140 139  Potassium 3.5 - 5.1 mmol/L 3.4(L) 3.3(L) 3.8  Chloride 101 - 111 mmol/L 104 104 103  CO2 22 - 32 mmol/L 26 22 20(L)  Calcium 8.9 - 10.3 mg/dL 7.8(L) 7.2(L) 7.4(L)    CBC Latest Ref Rng & Units 09/01/2017 08/31/2017 08/29/2017  WBC 3.6 - 11.0 K/uL 13.6(H) 10.2 8.2  Hemoglobin 12.0 - 16.0 g/dL 9.0(L) 10.0(L) 10.4(L)  Hematocrit 35.0 - 47.0 % 26.9(L) 29.1(L) 30.3(L)  Platelets 150 - 440 K/uL 108(L) 86(L) 78(L)    Hepatic Function Latest Ref Rng & Units 09/01/2017 08/31/2017 08/29/2017  Total Protein 6.5 - 8.1 g/dL 6.7 6.3(L) 6.0(L)  Albumin 3.5 - 5.0 g/dL 2.9(L) 2.8(L) 2.7(L)  AST 15 - 41 U/L 40 48(H) 69(H)  ALT 14 - 54 U/L 8(L) 11(L) 12(L)  Alk Phosphatase 38 - 126 U/L 110 112 134(H)  Total Bilirubin 0.3 - 1.2 mg/dL 0.8 0.8 0.6   Results for orders placed or performed during the hospital encounter of  08/22/17  Blood Culture (routine x 2)     Status: None   Collection Time: 08/22/17 12:52 PM  Result Value Ref Range Status   Specimen Description BLOOD RIGHT HAND  Final   Special Requests   Final    BOTTLES DRAWN AEROBIC AND ANAEROBIC Blood Culture adequate volume   Culture   Final    NO GROWTH 5 DAYS Performed at [facility], [address] [place], [place] [address]    Report Status 08/27/2017 FINAL  Final  Urine culture     Status: Abnormal   Collection Time: 08/22/17 12:52 PM  Result Value Ref Range Status   Specimen Description   Final    URINE, RANDOM Performed at [facility], [address], [place], [place] [address]    Special Requests   Final    NONE Performed at [facility], [address] [place], [place] [address]    Culture (A)  Final    >=100,000 COLONIES/mL KLEBSIELLA PNEUMONIAE 60,000 COLONIES/mL PROTEUS MIRABILIS    Report Status 08/24/2017 FINAL  Final   Organism ID, Bacteria KLEBSIELLA PNEUMONIAE (A)  Final   Organism ID, Bacteria PROTEUS MIRABILIS (A)  Final      Susceptibility   Klebsiella pneumoniae - MIC*    AMPICILLIN  RESISTANT Resistant     CEFAZOLIN <=4 SENSITIVE Sensitive     CEFTRIAXONE <=1 SENSITIVE Sensitive     CIPROFLOXACIN <=0.25 SENSITIVE Sensitive     GENTAMICIN <=1 SENSITIVE Sensitive     IMIPENEM <=0.25 SENSITIVE Sensitive     NITROFURANTOIN 64 INTERMEDIATE Intermediate     TRIMETH/SULFA <=20 SENSITIVE Sensitive     AMPICILLIN/SULBACTAM 4 SENSITIVE Sensitive     PIP/TAZO <=4 SENSITIVE Sensitive     Extended ESBL NEGATIVE Sensitive     * >=100,000 COLONIES/mL KLEBSIELLA PNEUMONIAE   Proteus mirabilis - MIC*    AMPICILLIN <=2 SENSITIVE Sensitive     CEFAZOLIN <=4 SENSITIVE Sensitive     CEFTRIAXONE <=1 SENSITIVE Sensitive     CIPROFLOXACIN <=0.25 SENSITIVE Sensitive     GENTAMICIN <=1 SENSITIVE Sensitive     IMIPENEM 1 SENSITIVE Sensitive     NITROFURANTOIN 128 RESISTANT Resistant      TRIMETH/SULFA <=20 SENSITIVE Sensitive     AMPICILLIN/SULBACTAM <=2 SENSITIVE Sensitive     PIP/TAZO <=4 SENSITIVE Sensitive     * 60,000 COLONIES/mL PROTEUS MIRABILIS  Blood Culture (routine x 2)     Status: None   Collection Time: 08/22/17 12:53 PM  Result Value Ref Range Status   Specimen Description BLOOD RIGHT ARM  Final   Special Requests   Final    BOTTLES DRAWN AEROBIC AND ANAEROBIC Blood Culture results may not be optimal due to an excessive volume of blood received in culture bottles   Culture   Final    NO GROWTH 5 DAYS Performed at [facility] [address] [place], [place] [address]    Report Status 08/27/2017 FINAL  Final  MRSA PCR Screening     Status: None   Collection Time: 08/22/17  9:18 PM  Result Value Ref Range Status   MRSA by PCR NEGATIVE NEGATIVE Final    Comment:        The GeneXpert MRSA Assay (FDA approved for NASAL specimens only), is one component of a comprehensive MRSA colonization surveillance program. It is not intended to diagnose MRSA infection nor to guide or monitor treatment for MRSA infections. Performed at [facility], [address] [place], [place] [address]   Culture, blood (Routine X 2) w Reflex to ID Panel     Status: None (Preliminary result)   Collection Time: 08/29/17  9:31 PM  Result Value Ref Range Status   Specimen Description BLOOD LEFT FOOT  Final   Special Requests   Final    BOTTLES DRAWN AEROBIC AND ANAEROBIC Blood Culture adequate volume   Culture   Final    NO GROWTH 3 DAYS Performed at [facility], [address], [place], [place] [address]    Report Status PENDING  Incomplete  Culture, blood (Routine X 2) w Reflex to ID Panel     Status: None (Preliminary result)   Collection Time: 08/29/17  9:50 PM  Result Value Ref Range Status   Specimen Description BLOOD RIGHT ANKLE  Final   Special Requests   Final    BOTTLES DRAWN AEROBIC AND ANAEROBIC Blood Culture adequate volume    Culture   Final    NO GROWTH 3 DAYS Performed at [facility], [address] [place], [place] [address]    Report Status PENDING  Incomplete    Anti-infectives (From admission, onward)   Start     Dose/Rate Route Frequency Ordered Stop   08/25/17 1100  cefTRIAXone (ROCEPHIN) 1 g in sodium chloride  0.9 % 100 mL IVPB  Status:  Discontinued     1 g 200 mL/hr over 30 Minutes Intravenous Every 24 hours 08/25/17 1048 08/31/17 1538   08/25/17 1100  doxycycline (VIBRAMYCIN) 100 mg in sodium chloride 0.9 % 250 mL IVPB     100 mg 125 mL/hr over 120 Minutes Intravenous Every 12 hours 08/25/17 1057     08/24/17 1230  ceFAZolin (ANCEF) IVPB 1 g/50 mL premix  Status:  Discontinued     1 g 100 mL/hr over 30 Minutes Intravenous Every 12 hours 08/24/17 1224 08/25/17 1048   08/22/17 2200  piperacillin-tazobactam (ZOSYN) IVPB 3.375 g  Status:  Discontinued     3.375 g 12.5 mL/hr over 240 Minutes Intravenous Every 8 hours 08/22/17 1509 08/24/17 1221   08/22/17 2200  vancomycin (VANCOCIN) IVPB 1000 mg/200 mL premix  Status:  Discontinued     1,000 mg 200 mL/hr over 60 Minutes Intravenous Every 24 hours 08/22/17 1509 08/23/17 2008   08/22/17 1400  piperacillin-tazobactam (ZOSYN) IVPB 3.375 g     3.375 g 100 mL/hr over 30 Minutes Intravenous  Once 08/22/17 1355 08/22/17 1431   08/22/17 1400  vancomycin (VANCOCIN) IVPB 1000 mg/200 mL premix     1,000 mg 200 mL/hr over 60 Minutes Intravenous  Once 08/22/17 1355 08/22/17 1510      CXR: NNF  MRI brain 06/19: No definite acute intracranial abnormality. 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  IMPRESSION: Severe sepsis of unclear etiology Recent tick bite - concern for ehrlichiosis (serologies negative)  Will consider convalescent titers next week Probable LLL PNA - treated Kelbsiella/proteus UTI - fully treated Elevated PCT - improving Acute encephalopathy, relapsing Elevated trop I, likely demand ischemia - resolving Pulmonary  edema, resolving Chronic LBBB PAF with RVR 6/19 > NSR after amiodarone initiated AKI, now on HD DM 2, controlled Elevated transaminases, resolved Thrombocytopenia, improving Acute protein-calorie malnutrition Low TSH, low free T4 of unclear etiology. Free T3 pending H/O breast Ca H/O CAD  PLAN/REC: Cont vent support - settings reviewed and/or adjusted Cont vent bundle Daily SBT if/when meets criteria  Will need ischemia eval after recovers from this acute illness Monitor BMET intermittently Monitor I/Os Correct electrolytes as indicated HD per Nephrology  Monitor temp, WBC count Micro and abx as above Per ID Service - complete 10 days of doxycycline (through 6/21)  Ceftriaxone discontinued 6/18 DVT px: SCDs Monitor CBC intermittently Transfuse per usual guidelines Cont TF protocol  Husband updated in detail. We discussed goals of care and prognosis.  I explained that I continue to believe that this is a reversible illness but we acknowledged that she has not responded to her efforts as anticipated.  He explained that she would not wish to live in a prolonged manner on life support.  I suggested that we continue our aggressive efforts through the weekend and into the first of next week.  If she is not making significant progress by that time, we would consider discontinuation of life-sustaining therapies.  I also suggested that we not reintubate her for extubation failure.   CCM time: 60 mins The above time includes time spent in consultation with patient and/or family members and reviewing care plan on multidisciplinary rounds  Merton Border, MD PCCM service Mobile [phone] Pager [phone] 09/01/2017 5:08 PM

## 2017-09-01 NOTE — Progress Notes (Signed)
To MRI with RT, RN and orderly. On cardiac monitor and vent.  Pt with some agitation.

## 2017-09-01 NOTE — Progress Notes (Signed)
This note also relates to the following rows which could not be included: Temp - Cannot attach notes to unvalidated device data Pulse Rate - Cannot attach notes to unvalidated device data Resp - Cannot attach notes to unvalidated device data  Hd started  

## 2017-09-01 NOTE — Progress Notes (Signed)
HD tx end    09/01/17 0538  Vital Signs  Temp 98.8 F (37.1 C)  Temp Source Rectal  Pulse Rate 66  Pulse Rate Source Monitor  Resp 17  BP (!) 146/71  BP Location Right Leg  BP Method Automatic  Patient Position (if appropriate) Lying  Oxygen Therapy  SpO2 100 %  O2 Device Ventilator  End Tidal CO2 (EtCO2) 33  During Hemodialysis Assessment  Dialysis Fluid Bolus Normal Saline  Bolus Amount (mL) 250 mL  Intra-Hemodialysis Comments Tx completed

## 2017-09-01 NOTE — Progress Notes (Signed)
Post HD assessment. Pt tolerated tx well without c/o or complication. Net UF 1009, goal met.    09/01/17 0549  Vital Signs  Temp 98.6 F (37 C)  Temp Source Rectal  Pulse Rate 95  Pulse Rate Source Monitor  Resp (!) 26  BP (!) 168/60  BP Location Right Leg  BP Method Automatic  Patient Position (if appropriate) Lying  Oxygen Therapy  SpO2 98 %  O2 Device Ventilator  End Tidal CO2 (EtCO2) 33  Dialysis Weight  Weight 109 kg (240 lb 4.8 oz)  Type of Weight Post-Dialysis  Post-Hemodialysis Assessment  Rinseback Volume (mL) 250 mL  KECN 53.3 V  Dialyzer Clearance Lightly streaked  Duration of HD Treatment -hour(s) 3 hour(s)  Hemodialysis Intake (mL) 500 mL  UF Total -Machine (mL) 1509 mL  Net UF (mL) 1009 mL  Tolerated HD Treatment Yes  Hemodialysis Catheter Left Internal jugular Triple-lumen  Placement Date/Time: 08/29/17 1400   Time Out: Correct patient;Correct site;Correct procedure  Maximum sterile barrier precautions: Hand hygiene;Sterile gloves;Cap;Large sterile sheet;Mask;Sterile gown  Site Prep: Chlorhexidine  Local Anesthetic: Inje...  Site Condition No complications  Blue Lumen Status Heparin locked  Red Lumen Status Heparin locked  Catheter fill solution Heparin 1000 units/ml  Catheter fill volume (Arterial) 1.4 cc  Catheter fill volume (Venous) 1.4  Dressing Type Biopatch  Dressing Status Clean;Dry;Intact  Drainage Description None  Post treatment catheter status Capped and Clamped

## 2017-09-01 NOTE — Progress Notes (Signed)
Pre HD assessment   09/01/17 0218  Vital Signs  Temp 98.8 F (37.1 C)  Temp Source Rectal  Pulse Rate (!) 110  Pulse Rate Source Monitor  Resp (!) 24  BP (!) 158/68  BP Location Right Leg  BP Method Automatic  Patient Position (if appropriate) Lying  Oxygen Therapy  SpO2 96 %  O2 Device Ventilator  End Tidal CO2 (EtCO2) 38  Critical Care Pain Observation Tool (CPOT)  Facial Expression 0  Body Movements 2  Muscle Tension 1  Compliance with ventilator (intubated pts.) 1  Vocalization (extubated pts.) N/A  CPOT Total 4  Dialysis Weight  Weight 110.3 kg (243 lb 2.7 oz)  Type of Weight Pre-Dialysis  Time-Out for Hemodialysis  What Procedure? HD  Pt Identifiers(min of two) First/Last Name;MRN/Account#  Correct Site? Yes  Correct Side? Yes  Correct Procedure? Yes  Consents Verified? Yes  Rad Studies Available? N/A  Safety Precautions Reviewed? Yes  Engineer, civil (consulting) Number  (6A)  Station Number  (bedside ICU 20)  UF/Alarm Test Passed  Conductivity: Meter 13.8  Conductivity: Machine  14.1  pH 7.6  Reverse Osmosis  (5705)  Normal Saline Lot Number [id number]  Dialyzer Lot Number [id number]  Disposable Set Lot Number [id number]  Machine Temperature 98.6 F (37 C)  Musician and Audible Yes  Blood Lines Intact and Secured Yes  Pre Treatment Patient Checks  Vascular access used during treatment Catheter  Hepatitis B Surface Antigen Results Negative  Date Hepatitis B Surface Antigen Drawn 08/29/17  Hepatitis B Surface Antibody  (>10)  Date Hepatitis B Surface Antibody Drawn 08/29/17  Hemodialysis Consent Verified Yes  Hemodialysis Standing Orders Initiated Yes  ECG (Telemetry) Monitor On Yes  Prime Ordered Normal Saline  Length of  DialysisTreatment -hour(s) 3 Hour(s)  Dialyzer Elisio 17H NR  Dialysate 3K, 2.5 Ca  Dialysis Anticoagulant None  Dialysate Flow Ordered 600  Blood Flow Rate Ordered 300 mL/min  Ultrafiltration Goal 1 Liters  Pre Treatment Labs  Phosphorus  Dialysis Blood Pressure Support Ordered Normal Saline  Education / Care Plan  Dialysis Education Provided No (Comment) (pt intubated, sedated, unable to answer questions)  Hemodialysis Catheter Left Internal jugular Triple-lumen  Placement Date/Time: 08/29/17 1400   Time Out: Correct patient;Correct site;Correct procedure  Maximum sterile barrier precautions: Hand hygiene;Sterile gloves;Cap;Large sterile sheet;Mask;Sterile gown  Site Prep: Chlorhexidine  Local Anesthetic: Inje...  Site Condition No complications  Blue Lumen Status Heparin locked  Red Lumen Status Heparin locked  Dressing Type Biopatch  Dressing Status Clean;Dry;Intact  Drainage Description None

## 2017-09-01 NOTE — Progress Notes (Signed)
   09/01/17 1415  Clinical Encounter Type  Visited With Patient not available  Visit Type Follow-up  Referral From Other (Comment) (palliative care np)  Consult/Referral To Chaplain   Chaplain attempted to follow up on order; patient receiving care.  Chaplain will attempt to find family, but will continue to be mindful of checking in with patient/family while rounding.

## 2017-09-01 NOTE — Progress Notes (Signed)
Pre HD assessment    09/01/17 0227  Neurological  Level of Consciousness Responds to Pain  Orientation Level Intubated/Tracheostomy - Unable to assess  Respiratory  Respiratory Pattern Regular;Unlabored  Chest Assessment Chest expansion symmetrical  Cardiac  ECG Monitor Yes  Antiarrhythmic device No  Vascular  R Radial Pulse +2  L Radial Pulse +2  Edema Generalized;Right upper extremity;Left upper extremity;Right lower extremity;Left lower extremity  Integumentary  Integumentary (WDL) X  Skin Color Appropriate for ethnicity;Red  Musculoskeletal  Musculoskeletal (WDL) X  Generalized Weakness Yes  GU Assessment  Genitourinary (WDL) X  Genitourinary Symptoms  (HD)  Psychosocial  Psychosocial (WDL) X  Patient Behaviors Not interactive

## 2017-09-01 NOTE — Progress Notes (Signed)
Post HD assessment    09/01/17 0547  Neurological  Level of Consciousness Responds to Pain  Orientation Level Intubated/Tracheostomy - Unable to assess  Respiratory  Respiratory Pattern Regular;Unlabored  Chest Assessment Chest expansion symmetrical  Cardiac  ECG Monitor Yes  Antiarrhythmic device No  Vascular  R Radial Pulse +2  L Radial Pulse +2  Edema Generalized;Right upper extremity;Left upper extremity;Right lower extremity;Left lower extremity  Integumentary  Integumentary (WDL) X  Skin Color Appropriate for ethnicity;Red  Musculoskeletal  Musculoskeletal (WDL) X  Generalized Weakness Yes  GU Assessment  Genitourinary (WDL) X  Genitourinary Symptoms  (HD)  Psychosocial  Psychosocial (WDL) X  Patient Behaviors Not interactive

## 2017-09-01 NOTE — Progress Notes (Signed)
[place], [place] 09/01/17  Subjective:  Patient remains critically ill.  She will be undergoing hemodialysis again this a.m. Currently the patient is intubated. Also requiring pressor therapy.  Objective:  Vital signs in last 24 hours:  Temp:  [98.1 F (36.7 C)-99.7 F (37.6 C)] 99.3 F (37.4 C) (06/19 1000) Pulse Rate:  [53-136] 112 (06/19 1000) Resp:  [8-34] 23 (06/19 1000) BP: (41-205)/(20-87) 139/87 (06/19 1000) SpO2:  [74 %-100 %] 97 % (06/19 0900) FiO2 (%):  [40 %-60 %] 40 % (06/19 0731) Weight:  [109 kg (240 lb 4.8 oz)-110.3 kg (243 lb 2.7 oz)] 109 kg (240 lb 4.8 oz) (06/19 0549)  Weight change: -0.1 kg (-3.5 oz) Filed Weights   08/31/17 0408 09/01/17 0218 09/01/17 0549  Weight: 106.7 kg (235 lb 3.7 oz) 110.3 kg (243 lb 2.7 oz) 109 kg (240 lb 4.8 oz)    Intake/Output:    Intake/Output Summary (Last 24 hours) at 09/01/2017 1013 Last data filed at 09/01/2017 0600 Gross per 24 hour  Intake 1297.44 ml  Output 1154 ml  Net 143.44 ml   Physical Exam: General:  Critically ill-appearing, laying in the bed  HEENT  anicteric  Neck:  Supple  Lungs:  Scattered rhonchi, vent assisted  Heart::  irregular  Abdomen:  Soft, nontender  Extremities:  1+ lower extremity edema  Neurologic:  sedated  Skin:  No acute rashes    Basic Metabolic Panel:  Recent Labs  Lab 08/28/17 0831 08/29/17 0455 08/29/17 2104 08/30/17 0652 08/30/17 1019 08/31/17 0400 09/01/17 0310  NA 128* 129* 133* 139  --  140 142  K 3.7 4.2 4.3 3.8  --  3.3* 3.4*  CL 100* 100* 101 103  --  104 104  CO2 14* 15* 15* 20*  --  22 26  GLUCOSE 253* 192* 213* 215*  --  225* 124*  BUN 81* 96* 91* 93*  --  72* 60*  CREATININE 9.31* 10.86* 10.07* 10.20*  --  7.97* 6.14*  CALCIUM 7.1* 7.6* 7.7* 7.4*  --  7.2* 7.8*  MG 2.5*  --  2.7*  --   --   --   --   PHOS 6.2*  --  7.7*  --  8.2*  --  3.9     CBC: Recent Labs  Lab 08/28/17 0734 08/29/17 0455 08/29/17 2104  08/31/17 0400 09/01/17 0310  WBC 5.5 8.3 8.2 10.2 13.6*  HGB 8.9* 10.7* 10.4* 10.0* 9.0*  HCT 26.3* 31.5* 30.3* 29.1* 26.9*  MCV 93.2 93.0 92.2 92.0 91.7  PLT 54* 63* 78* 86* 108*      Lab Results  Component Value Date   HEPBSAG Negative 08/29/2017   HEPBSAB Reactive 08/29/2017      Microbiology:  Recent Results (from the past 240 hour(s))  Blood Culture (routine x 2)     Status: None   Collection Time: 08/22/17 12:52 PM  Result Value Ref Range Status   Specimen Description BLOOD RIGHT HAND  Final   Special Requests   Final    BOTTLES DRAWN AEROBIC AND ANAEROBIC Blood Culture adequate volume   Culture   Final    NO GROWTH 5 DAYS Performed at [facility], [address], [place], [place] [address]    Report Status 08/27/2017 FINAL  Final  Urine culture     Status: Abnormal   Collection Time: 08/22/17 12:52 PM  Result Value Ref Range Status   Specimen Description   Final    URINE, RANDOM  Performed at [facility], [address] [place], [place] [address]    Special Requests   Final    NONE Performed at [facility], [address] [place], [place] [address]    Culture (A)  Final    >=100,000 COLONIES/mL KLEBSIELLA PNEUMONIAE 60,000 COLONIES/mL PROTEUS MIRABILIS    Report Status 08/24/2017 FINAL  Final   Organism ID, Bacteria KLEBSIELLA PNEUMONIAE (A)  Final   Organism ID, Bacteria PROTEUS MIRABILIS (A)  Final      Susceptibility   Klebsiella pneumoniae - MIC*    AMPICILLIN RESISTANT Resistant     CEFAZOLIN <=4 SENSITIVE Sensitive     CEFTRIAXONE <=1 SENSITIVE Sensitive     CIPROFLOXACIN <=0.25 SENSITIVE Sensitive     GENTAMICIN <=1 SENSITIVE Sensitive     IMIPENEM <=0.25 SENSITIVE Sensitive     NITROFURANTOIN 64 INTERMEDIATE Intermediate     TRIMETH/SULFA <=20 SENSITIVE Sensitive     AMPICILLIN/SULBACTAM 4 SENSITIVE Sensitive     PIP/TAZO <=4 SENSITIVE Sensitive     Extended ESBL NEGATIVE Sensitive     * >=100,000  COLONIES/mL KLEBSIELLA PNEUMONIAE   Proteus mirabilis - MIC*    AMPICILLIN <=2 SENSITIVE Sensitive     CEFAZOLIN <=4 SENSITIVE Sensitive     CEFTRIAXONE <=1 SENSITIVE Sensitive     CIPROFLOXACIN <=0.25 SENSITIVE Sensitive     GENTAMICIN <=1 SENSITIVE Sensitive     IMIPENEM 1 SENSITIVE Sensitive     NITROFURANTOIN 128 RESISTANT Resistant     TRIMETH/SULFA <=20 SENSITIVE Sensitive     AMPICILLIN/SULBACTAM <=2 SENSITIVE Sensitive     PIP/TAZO <=4 SENSITIVE Sensitive     * 60,000 COLONIES/mL PROTEUS MIRABILIS  Blood Culture (routine x 2)     Status: None   Collection Time: 08/22/17 12:53 PM  Result Value Ref Range Status   Specimen Description BLOOD RIGHT ARM  Final   Special Requests   Final    BOTTLES DRAWN AEROBIC AND ANAEROBIC Blood Culture results may not be optimal due to an excessive volume of blood received in culture bottles   Culture   Final    NO GROWTH 5 DAYS Performed at [facility], [address] [place], [place] [address]    Report Status 08/27/2017 FINAL  Final  MRSA PCR Screening     Status: None   Collection Time: 08/22/17  9:18 PM  Result Value Ref Range Status   MRSA by PCR NEGATIVE NEGATIVE Final    Comment:        The GeneXpert MRSA Assay (FDA approved for NASAL specimens only), is one component of a comprehensive MRSA colonization surveillance program. It is not intended to diagnose MRSA infection nor to guide or monitor treatment for MRSA infections. Performed at [facility], [address] [place], [place] [address]   Culture, blood (Routine X 2) w Reflex to ID Panel     Status: None (Preliminary result)   Collection Time: 08/29/17  9:31 PM  Result Value Ref Range Status   Specimen Description BLOOD LEFT FOOT  Final   Special Requests   Final    BOTTLES DRAWN AEROBIC AND ANAEROBIC Blood Culture adequate volume   Culture   Final    NO GROWTH 3 DAYS Performed at [facility], [address] [place], [place]  [address]    Report Status PENDING  Incomplete  Culture, blood (Routine X 2) w Reflex to ID Panel     Status: None (Preliminary result)   Collection Time: 08/29/17  9:50  PM  Result Value Ref Range Status   Specimen Description BLOOD RIGHT ANKLE  Final   Special Requests   Final    BOTTLES DRAWN AEROBIC AND ANAEROBIC Blood Culture adequate volume   Culture   Final    NO GROWTH 3 DAYS Performed at [facility], [address], [place], [place] [address]    Report Status PENDING  Incomplete    Coagulation Studies: Recent Labs    08/29/17 06/28/02  LABPROT 13.9  INR 1.08    Urinalysis: No results for input(s): COLORURINE, LABSPEC, PHURINE, GLUCOSEU, HGBUR, BILIRUBINUR, KETONESUR, PROTEINUR, UROBILINOGEN, NITRITE, LEUKOCYTESUR in the last 72 hours.  Invalid input(s): APPERANCEUR    Imaging: Dg Abd 1 View  Result Date: 08/31/2017 CLINICAL DATA:  Nasogastric tube placement. EXAM: ABDOMEN - 1 VIEW COMPARISON:  None. FINDINGS: The bowel gas pattern is normal. Distal tip of feeding tube is seen in expected position of stomach. No radio-opaque calculi or other significant radiographic abnormality are seen. IMPRESSION: No evidence of bowel obstruction or ileus. Distal tip of feeding tube seen in expected position of stomach. Electronically Signed   By: Marijo Conception, M.D.   On: 08/31/2017 12:12   Dg Chest Port 1 View  Result Date: 08/31/2017 CLINICAL DATA:  Intubation, history breast cancer, coronary artery disease, diabetes mellitus, hypertension EXAM: PORTABLE CHEST 1 VIEW COMPARISON:  Portable exam 1709 hours compared to 0341 hours FINDINGS: Tip of endotracheal tube is at the carina recommend withdrawal 2.0-2.5 cm. Nasogastric tube extends into stomach. RIGHT arm PICC line tip projects over SVC. LEFT jugular central venous catheter tip projects over SVC. Numerous EKG leads project over chest. Enlargement of cardiac silhouette. Stable mediastinal contours and pulmonary vascularity.  Question minimal pulmonary edema with bibasilar effusions and atelectasis. No pneumothorax. IMPRESSION: Probable bibasilar effusions and atelectasis. Tip of endotracheal tube is at the carina, recommend withdrawal 2.0-2.5 cm. Findings called to patient's nurse Tanali RN in ICU on 08/31/2017 at 1738 hrs. Electronically Signed   By: Lavonia Dana M.D.   On: 08/31/2017 17:39   Dg Chest Port 1 View  Result Date: 08/31/2017 CLINICAL DATA:  Respiratory failure. EXAM: PORTABLE CHEST 1 VIEW COMPARISON:  08/29/2017. FINDINGS: Dual-lumen left IJ catheter tip in upper SVC. Right PICC line tip in upper SVC. Cardiomegaly with diffuse interstitial prominence again noted. Bilateral pleural effusions again noted. Findings suggest CHF. Bilateral pneumonitis cannot be excluded. Low lung volumes with bibasilar atelectasis/infiltrates. No pneumothorax. IMPRESSION: 1. Dual-lumen left IJ catheter tip in upper SVC. Right PICC line tip in upper SVC. No pneumothorax. 2. Cardiomegaly with diffuse interstitial prominence bilateral pleural effusions suggesting CHF again noted. Bibasilar atelectasis/infiltrates again noted. Similar findings noted on prior exam. Electronically Signed   By: [place]   On: 08/31/2017 05:53     Medications:   . doxycycline (VIBRAMYCIN) IV Stopped (08/31/17 2356)  . norepinephrine (LEVOPHED) Adult infusion 1 mcg/min (09/01/17 0900)  . propofol (DIPRIVAN) infusion Stopped (09/01/17 0845)   . B-complex with vitamin C  1 tablet Per Tube QHS  . chlorhexidine  15 mL Mouth Rinse BID  . Chlorhexidine Gluconate Cloth  6 each Topical Q0600  . feeding supplement (NEPRO CARB STEADY)  1,000 mL Per Tube Q24H  . feeding supplement (PRO-STAT SUGAR FREE 64)  30 mL Per Tube Daily  . free water  200 mL Per Tube Q8H  . insulin aspart  0-15 Units Subcutaneous Q4H  . insulin aspart  3 Units Subcutaneous Q4H  . insulin glargine  10 Units  Subcutaneous Daily  . letrozole  2.5 mg Oral Daily  . mouth rinse  15  mL Mouth Rinse 10 times per day  . pantoprazole sodium  40 mg Per Tube Daily  . sodium chloride flush  10-40 mL Intracatheter Q12H   acetaminophen **OR** acetaminophen, bisacodyl, fentaNYL (SUBLIMAZE) injection, fentaNYL (SUBLIMAZE) injection, heparin lock flush, hydrALAZINE, ipratropium-albuterol, metoprolol tartrate, [DISCONTINUED] ondansetron **OR** ondansetron (ZOFRAN) IV, sodium chloride flush  Assessment/ Plan:  72 y.o. female with diabetes, coronary disease, sleep apnea, history of breast cancer, was admitted on 08/22/2017 with fever, chills, tick bite.   1.  Acute renal failure due to hypotension, sepsis. 2.  Sepsis 3.  Urinary tract infection-Klebsiella, Proteus 4.  Elevated troponin/demand ischemia 5.  Hypotension 6.  Thrombocytopenia 7.  Hyponatremia, Hypokalemia 8.  Lower extremity Edema 9.  Acute respiratory failure, placed on ventilator 08/31/17.  -We are planning for additional hemodialysis this a.m.  Ultrafiltration target set at 1.5 kg.  We will need to monitor blood pressure closely during treatment as she is a bit more hypotensive as compared to yesterday.  However patient is on pressor therapy.  Her acute respiratory failure worsened yesterday and patient is now on the ventilator.  Hopefully she can be weaned off of the ventilator over the next several days.  We will continue to reevaluate for dialysis daily for now.     LOS: [place] 6/19/201910:13 AM  [place], [place]  Note: This note was prepared with Dragon dictation. Any transcription errors are unintentional

## 2017-09-01 NOTE — Progress Notes (Signed)
Pt with increased agitation. VSS, increased propofol to 26ml/hr on MRI pump.

## 2017-09-01 NOTE — Progress Notes (Signed)
[facility] [place] NAME: Bonnie Holt    MR#:  [id number]  DATE OF BIRTH:  08-14-1945  SUBJECTIVE:   Patient was intubated overnight.  Planning for another hemodialysis session today.  REVIEW OF SYSTEMS:   Review of Systems  Unable to perform ROS: Intubated   Tolerating Diet: No Tolerating PT: Await Eval.  DRUG ALLERGIES:   Allergies  Allergen Reactions  . Ace Inhibitors Cough    VITALS:  Blood pressure 132/71, pulse 70, temperature 99.3 F (37.4 C), resp. rate (!) 25, height 5\' 2"  (1.575 m), weight 109 kg (240 lb 4.8 oz), SpO2 94 %.  PHYSICAL EXAMINATION:   Physical Exam  GENERAL:  72 y.o.-year-old obese patient lying in the bed sedated & Intubated.  EYES: Pupils equal, round, reactive to light. No scleral icterus.  HEENT: Head atraumatic, normocephalic. ET and OG tubes in place.  NECK:  Supple, no jugular venous distention. No thyroid enlargement, no tenderness.  LUNGS: Good a/e B/l,  no wheezing, rales, rhonchi. No use of accessory muscles of respiration.  CARDIOVASCULAR: S1, S2 normal. No murmurs, rubs, or gallops.  ABDOMEN: Soft, nontender, nondistended. Bowel sounds present. No organomegaly or mass.  EXTREMITIES: No cyanosis, clubbing, +2 edema from ankles to knees b/l.    NEUROLOGIC: Sedated & Intubated.  PSYCHIATRIC: Sedated & intubated.     SKIN: No obvious rash, lesion, or ulcer.   LABORATORY PANEL:  CBC Recent Labs  Lab 09/01/17 0310  WBC 13.6*  HGB 9.0*  HCT 26.9*  PLT 108*    Chemistries  Recent Labs  Lab 08/29/17 2104  09/01/17 0310  NA 133*   < > 142  K 4.3   < > 3.4*  CL 101   < > 104  CO2 15*   < > 26  GLUCOSE 213*   < > 124*  BUN 91*   < > 60*  CREATININE 10.07*   < > 6.14*  CALCIUM 7.7*   < > 7.8*  MG 2.7*  --   --   AST 69*   < > 40  ALT 12*   < > 8*  ALKPHOS 134*   < > 110  BILITOT 0.6   < > 0.8   < > = values in this interval not displayed.   Cardiac Enzymes Recent  Labs  Lab 09/01/17 0310  TROPONINI 0.28*   RADIOLOGY:  Dg Abd 1 View  Result Date: 08/31/2017 CLINICAL DATA:  Nasogastric tube placement. EXAM: ABDOMEN - 1 VIEW COMPARISON:  None. FINDINGS: The bowel gas pattern is normal. Distal tip of feeding tube is seen in expected position of stomach. No radio-opaque calculi or other significant radiographic abnormality are seen. IMPRESSION: No evidence of bowel obstruction or ileus. Distal tip of feeding tube seen in expected position of stomach. Electronically Signed   By: Marijo Conception, M.D.   On: 08/31/2017 12:12   Mr Brain Wo Contrast  Result Date: 09/01/2017 CLINICAL DATA:  Initial evaluation for acute altered mental status, fever, tech bite. EXAM: MRI HEAD WITHOUT CONTRAST TECHNIQUE: Multiplanar, multiecho pulse sequences of the brain and surrounding structures were obtained without intravenous contrast. COMPARISON:  Prior CT from 08/14/2017. FINDINGS: Brain: Examination moderately degraded by motion artifact. No evidence for acute infarct. Gray-white matter differentiation maintained. Small remote lacunar infarct noted within the left caudate. No other discernible areas of chronic infarction. No acute or chronic intracranial hemorrhage. No appreciable mass lesion on this motion degraded exam. No  mass effect or midline shift. No hydrocephalus. No extra-axial collection. Normal pituitary gland. Vascular: Major intracranial vascular flow voids maintained at the skull base. Skull and upper cervical spine: Craniocervical junction within normal limits. Upper cervical spine normal. No marrow replacing lesion. Scalp soft tissues unremarkable. Sinuses/Orbits: Globes and orbital soft tissues grossly within normal limits. Scattered mucosal thickening within the ethmoidal air cells and sphenoid sinuses. Fluid seen within the nasopharynx. Nasogastric tube in place. Bilateral mastoid effusions noted. Other: None. IMPRESSION: 1. Limited study due to extensive motion  artifact. No definite acute intracranial abnormality. 2. 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 Electronically Signed   By: Jeannine Boga M.D.   On: 09/01/2017 15:37   Dg Chest Port 1 View  Result Date: 08/31/2017 CLINICAL DATA:  Intubation, history breast cancer, coronary artery disease, diabetes mellitus, hypertension EXAM: PORTABLE CHEST 1 VIEW COMPARISON:  Portable exam 1709 hours compared to 0341 hours FINDINGS: Tip of endotracheal tube is at the carina recommend withdrawal 2.0-2.5 cm. Nasogastric tube extends into stomach. RIGHT arm PICC line tip projects over SVC. LEFT jugular central venous catheter tip projects over SVC. Numerous EKG leads project over chest. Enlargement of cardiac silhouette. Stable mediastinal contours and pulmonary vascularity. Question minimal pulmonary edema with bibasilar effusions and atelectasis. No pneumothorax. IMPRESSION: Probable bibasilar effusions and atelectasis. Tip of endotracheal tube is at the carina, recommend withdrawal 2.0-2.5 cm. Findings called to patient's nurse Tanali RN in ICU on 08/31/2017 at 1738 hrs. Electronically Signed   By: Lavonia Dana M.D.   On: 08/31/2017 17:39   Dg Chest Port 1 View  Result Date: 08/31/2017 CLINICAL DATA:  Respiratory failure. EXAM: PORTABLE CHEST 1 VIEW COMPARISON:  08/29/2017. FINDINGS: Dual-lumen left IJ catheter tip in upper SVC. Right PICC line tip in upper SVC. Cardiomegaly with diffuse interstitial prominence again noted. Bilateral pleural effusions again noted. Findings suggest CHF. Bilateral pneumonitis cannot be excluded. Low lung volumes with bibasilar atelectasis/infiltrates. No pneumothorax. IMPRESSION: 1. Dual-lumen left IJ catheter tip in upper SVC. Right PICC line tip in upper SVC. No pneumothorax. 2. Cardiomegaly with diffuse interstitial prominence bilateral pleural effusions suggesting CHF again noted. Bibasilar atelectasis/infiltrates again noted. Similar findings noted on prior exam.  Electronically Signed   By: Crystal   On: 08/31/2017 05:53   ASSESSMENT AND PLAN:   Bonnie Holt is a 72 y.o. female has a past medical history significant for DM, HTN, and CAD now with 3 day hx of fever, chills, and back apin. Now confused and lethargic. In ER, pt febrile and tachycardic. Lactic acid elevated c/w with sepsis. UA abnormal and CXR negative  1.  Sepsis secondary to likely UTI/suspected RMSF/ehrlichiosis -Appreciate infectious disease input and they think this is likely Ehrlichiosis - Urine cultures are + for  Klebsiella pneumonia and Proteus - blood culture are negative - Continue IV Doxy for a total of 10 days.   Finished Treatment of UTI w/ IV Ceftriaxone.  2. acute hypoxic respiratory failure secondary to pulmonary edema in the setting of IV fluid volume overload -Patient failed BiPAP and is now intubated and sedated. - Continue vent weaning and further care as per pulmonary/intensivist. -Continue dialysis for fluid removal.  3 Acute Renal failure - likely due to ATN from sepsis.  - pt's urine output remains quite poor with only 145 cc/24 hrs. Cr. Trending down after HD though and  Plan for another HD today. Nephrology following and appreciate input.  -Continue further care as per nephrology.  4. Elevated troponin -  demand ischemia in the setting of sepsis -patient denies any chest pain -cardiology consultation placed with dr [name] and no plan for any acute intervention.  - Meds on hold due to sepsis. Hypotension.  -echo shows EF 50-55% with no wall motional abnormalities.   5. Hx of Breast Cancer - cont. Femara  6.  Thrombocytopenia-secondary to underlying sepsis.   -plt count improving and today up to 108.  No acute bleeding.   7.  Mixed metabolic/respiratory acidosis-secondary to underlying renal failure combined with pulmonary edema. - now intubated and also started on dialysis. Follow ABG.   Prognosis is guarded to poor. Pt. Is FULL CODE.   Case  discussed with Care Management/Social Worker. Management plans discussed with the patient, family and they are in agreement.  CODE STATUS: full  DVT Prophylaxis: TEd's & SCD's.   TOTAL TIME TAKING CARE OF THIS PATIENT: 30 mins   POSSIBLE D/C unclear, DEPENDING ON CLINICAL CONDITION and course.  Note: This dictation was prepared with Dragon dictation along with smaller phrase technology. Any transcriptional errors that result from this process are unintentional.  [name] M.D on 09/01/2017 at 3:57 PM  Between 7am to 6pm - Pager - [phone]  After 6pm go to [email or web address] - password [email or web address] [facility]  Office  [phone]  CC: Primary care physician; Kirk Ruths, MDPatient ID: Bonnie Holt, female   DOB: [birth year], 72 y.o.   MRN: [id number]

## 2017-09-02 ENCOUNTER — Inpatient Hospital Stay: Payer: PPO

## 2017-09-02 DIAGNOSIS — Z515 Encounter for palliative care: Secondary | ICD-10-CM

## 2017-09-02 LAB — CBC
HCT: 25 % — ABNORMAL LOW (ref 35.0–47.0)
Hemoglobin: 8.5 g/dL — ABNORMAL LOW (ref 12.0–16.0)
MCH: 31.3 pg (ref 26.0–34.0)
MCHC: 34.1 g/dL (ref 32.0–36.0)
MCV: 91.9 fL (ref 80.0–100.0)
PLATELETS: 101 10*3/uL — AB (ref 150–440)
RBC: 2.72 MIL/uL — AB (ref 3.80–5.20)
RDW: 15.8 % — ABNORMAL HIGH (ref 11.5–14.5)
WBC: 13.7 10*3/uL — AB (ref 3.6–11.0)

## 2017-09-02 LAB — BASIC METABOLIC PANEL
Anion gap: 12 (ref 5–15)
BUN: 53 mg/dL — ABNORMAL HIGH (ref 6–20)
CALCIUM: 7.6 mg/dL — AB (ref 8.9–10.3)
CO2: 22 mmol/L (ref 22–32)
CREATININE: 5.25 mg/dL — AB (ref 0.44–1.00)
Chloride: 102 mmol/L (ref 101–111)
GFR, EST AFRICAN AMERICAN: 9 mL/min — AB (ref >60)
GFR, EST NON AFRICAN AMERICAN: 7 mL/min — AB (ref >60)
Glucose, Bld: 159 mg/dL — ABNORMAL HIGH (ref 65–99)
Potassium: 3.4 mmol/L — ABNORMAL LOW (ref 3.5–5.1)
SODIUM: 136 mmol/L (ref 135–145)

## 2017-09-02 LAB — GLUCOSE, CAPILLARY
GLUCOSE-CAPILLARY: 150 mg/dL — AB (ref 65–99)
GLUCOSE-CAPILLARY: 119 mg/dL — AB (ref 65–99)
Glucose-Capillary: 122 mg/dL — ABNORMAL HIGH (ref 65–99)
Glucose-Capillary: 91 mg/dL (ref 65–99)
Glucose-Capillary: 145 mg/dL — ABNORMAL HIGH (ref 65–99)

## 2017-09-02 LAB — C DIFFICILE QUICK SCREEN W PCR REFLEX
C Diff antigen: NEGATIVE
C Diff interpretation: NOT DETECTED
C Diff toxin: NEGATIVE

## 2017-09-02 LAB — T3, FREE: T3 FREE: 1.3 pg/mL — AB (ref 2.0–4.4)

## 2017-09-02 MED ORDER — NEPRO/CARBSTEADY PO LIQD
1000.0000 mL | ORAL | Status: DC
  Administered 2017-09-02 – 2017-09-04 (×3): 1000 mL

## 2017-09-02 MED ORDER — FENTANYL CITRATE (PF) 100 MCG/2ML IJ SOLN
12.5000 ug | INTRAMUSCULAR | Status: DC | PRN
  Administered 2017-09-02 (×2): 12.5 ug via INTRAVENOUS
  Filled 2017-09-02 (×2): qty 2

## 2017-09-02 MED ORDER — DEXMEDETOMIDINE HCL IN NACL 400 MCG/100ML IV SOLN
0.4000 ug/kg/h | INTRAVENOUS | Status: DC
  Administered 2017-09-03: 0.4 ug/kg/h via INTRAVENOUS
  Administered 2017-09-03: 0.6 ug/kg/h via INTRAVENOUS
  Filled 2017-09-02 (×2): qty 100

## 2017-09-02 NOTE — Progress Notes (Signed)
[facility] [place] NAME: Bonnie Holt    MR#:  [id number]  DATE OF BIRTH:  Oct 13, 1945  SUBJECTIVE:   Extubated this morning and now follows simple commands.  Currently getting hemodialysis.  REVIEW OF SYSTEMS:   Review of Systems  Unable to perform ROS: Mental acuity   Tolerating Diet: No Tolerating PT: Await Eval.  DRUG ALLERGIES:   Allergies  Allergen Reactions  . Ace Inhibitors Cough    VITALS:  Blood pressure (!) 141/68, pulse 91, temperature 99.1 F (37.3 C), resp. rate (!) 8, height 5\' 2"  (1.575 m), weight 107.9 kg (237 lb 14 oz), SpO2 95 %.  PHYSICAL EXAMINATION:   Physical Exam  GENERAL:  72 y.o.-year-old obese patient lying in the bed extubated and follows simple commands.  EYES: Pupils equal, round, reactive to light. No scleral icterus.  HEENT: Head atraumatic, normocephalic. Dry Oral Mucosa.  NECK:  Supple, no jugular venous distention. No thyroid enlargement, no tenderness.  LUNGS: Good a/e B/l,  no wheezing, rales, rhonchi. No use of accessory muscles of respiration.  CARDIOVASCULAR: S1, S2 normal. No murmurs, rubs, or gallops.  ABDOMEN: Soft, nontender, nondistended. Bowel sounds present. No organomegaly or mass.  EXTREMITIES: No cyanosis, clubbing, +2 edema from ankles to knees b/l.    NEUROLOGIC: Cranial nerves II through XII intact, follows simple commands, no focal motor or sensory deficits appreciated bilaterally.  Globally weak. PSYCHIATRIC: AAO X 1.    SKIN: No obvious rash, lesion, or ulcer.   LABORATORY PANEL:  CBC Recent Labs  Lab 09/02/17 0435  WBC 13.7*  HGB 8.5*  HCT 25.0*  PLT 101*    Chemistries  Recent Labs  Lab 08/29/17 2104  09/01/17 0310 09/02/17 0644  NA 133*   < > 142 136  K 4.3   < > 3.4* 3.4*  CL 101   < > 104 102  CO2 15*   < > 26 22  GLUCOSE 213*   < > 124* 159*  BUN 91*   < > 60* 53*  CREATININE 10.07*   < > 6.14* 5.25*  CALCIUM 7.7*   < > 7.8* 7.6*  MG  2.7*  --   --   --   AST 69*   < > 40  --   ALT 12*   < > 8*  --   ALKPHOS 134*   < > 110  --   BILITOT 0.6   < > 0.8  --    < > = values in this interval not displayed.   Cardiac Enzymes Recent Labs  Lab 09/01/17 0310  TROPONINI 0.28*   RADIOLOGY:  Mr Brain Wo Contrast  Result Date: 09/01/2017 CLINICAL DATA:  Initial evaluation for acute altered mental status, fever, tech bite. EXAM: MRI HEAD WITHOUT CONTRAST TECHNIQUE: Multiplanar, multiecho pulse sequences of the brain and surrounding structures were obtained without intravenous contrast. COMPARISON:  Prior CT from 08/14/2017. FINDINGS: Brain: Examination moderately degraded by motion artifact. No evidence for acute infarct. Gray-white matter differentiation maintained. Small remote lacunar infarct noted within the left caudate. No other discernible areas of chronic infarction. No acute or chronic intracranial hemorrhage. No appreciable mass lesion on this motion degraded exam. No mass effect or midline shift. No hydrocephalus. No extra-axial collection. Normal pituitary gland. Vascular: Major intracranial vascular flow voids maintained at the skull base. Skull and upper cervical spine: Craniocervical junction within normal limits. Upper cervical spine normal. No marrow replacing lesion. Scalp soft tissues  unremarkable. Sinuses/Orbits: Globes and orbital soft tissues grossly within normal limits. Scattered mucosal thickening within the ethmoidal air cells and sphenoid sinuses. Fluid seen within the nasopharynx. Nasogastric tube in place. Bilateral mastoid effusions noted. Other: None. IMPRESSION: 1. Limited study due to extensive motion artifact. No definite acute intracranial abnormality. 2. 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 Electronically Signed   By: Jeannine Boga M.D.   On: 09/01/2017 15:37   Dg Chest Port 1 View  Result Date: 09/02/2017 CLINICAL DATA:  Respiratory failure EXAM: PORTABLE CHEST 1 VIEW COMPARISON:  Two  days ago FINDINGS: Cardiomegaly and thickened hila, likely vascular. There is a left pleural effusion that is small to moderate. No Kerley lines or air bronchogram. Left IJ dialysis catheter with tip near the upper SVC. Right upper extremity PICC in good position. Endotracheal tube tip is between the clavicular heads and carina. An orogastric tube reaches the stomach. IMPRESSION: 1. Stable cardiomegaly with vascular congestion.Stable left pleural effusion. 2. Unremarkable positioning of hardware. Electronically Signed   By: Monte Fantasia M.D.   On: 09/02/2017 07:34   Dg Chest Port 1 View  Result Date: 08/31/2017 CLINICAL DATA:  Intubation, history breast cancer, coronary artery disease, diabetes mellitus, hypertension EXAM: PORTABLE CHEST 1 VIEW COMPARISON:  Portable exam 1709 hours compared to 0341 hours FINDINGS: Tip of endotracheal tube is at the carina recommend withdrawal 2.0-2.5 cm. Nasogastric tube extends into stomach. RIGHT arm PICC line tip projects over SVC. LEFT jugular central venous catheter tip projects over SVC. Numerous EKG leads project over chest. Enlargement of cardiac silhouette. Stable mediastinal contours and pulmonary vascularity. Question minimal pulmonary edema with bibasilar effusions and atelectasis. No pneumothorax. IMPRESSION: Probable bibasilar effusions and atelectasis. Tip of endotracheal tube is at the carina, recommend withdrawal 2.0-2.5 cm. Findings called to patient's nurse Tanali RN in ICU on 08/31/2017 at 1738 hrs. Electronically Signed   By: Lavonia Dana M.D.   On: 08/31/2017 17:39   ASSESSMENT AND PLAN:   Bonnie Holt is a 72 y.o. female has a past medical history significant for DM, HTN, and CAD now with 3 day hx of fever, chills, and back apin. Now confused and lethargic. In ER, pt febrile and tachycardic. Lactic acid elevated c/w with sepsis. UA abnormal and CXR negative  1.  Sepsis secondary to likely UTI/suspected RMSF/ehrlichiosis -Appreciate infectious  disease input and they think this is likely Ehrlichiosis - Urine cultures are + for  Klebsiella pneumonia and Proteus - blood culture remain negative - Continue IV Doxy for a total of 10 days.   Finished Treatment of UTI w/ IV Ceftriaxone.  2. acute hypoxic respiratory failure secondary to pulmonary edema in the setting of IV fluid volume overload -Patient failed BiPAP and was intubated but extubated this a.m. And doing well on RA.  -Continue dialysis for fluid removal.  3 Acute Renal failure - likely due to ATN from sepsis.  - pt's urine output remains quite poor. Cr. Trending down after HD though and  Plan for another HD today. Nephrology following and appreciate input.  -Continue further care as per nephrology.  4. Elevated troponin - demand ischemia in the setting of sepsis -patient denies any chest pain -cardiology consultation placed with dr [name] and no plan for any acute intervention.  - Meds on hold due to sepsis. Hypotension.  -echo shows EF 50-55% with no wall motional abnormalities.   5. Hx of Breast Cancer - cont. Femara  6.  Thrombocytopenia-secondary to underlying sepsis.   -plt  count improving and today up to 110.  No acute bleeding.    Prognosis is guarded to poor. Pt. Is FULL CODE.   Case discussed with Care Management/Social Worker. Management plans discussed with the patient, family and they are in agreement.  CODE STATUS: full  DVT Prophylaxis: TEd's & SCD's.   TOTAL TIME TAKING CARE OF THIS PATIENT: 30 mins   POSSIBLE D/C unclear, DEPENDING ON CLINICAL CONDITION and course.  Note: This dictation was prepared with Dragon dictation along with smaller phrase technology. Any transcriptional errors that result from this process are unintentional.  [name] M.D on 09/02/2017 at 3:55 PM  Between 7am to 6pm - Pager - [phone]  After 6pm go to [email or web address] - password [email or web address] [facility]  Office  [phone]  CC: Primary care  physician; Kirk Ruths, MDPatient ID: [name], female   DOB: August 19, 1945, 72 y.o.   MRN: [id number]

## 2017-09-02 NOTE — Progress Notes (Signed)
   09/02/17 1630  Vital Signs  Temp 99.3 F (37.4 C)  Pulse Rate 95  Resp 12  BP 122/87  Oxygen Therapy  SpO2 95 %  During Hemodialysis Assessment  Blood Flow Rate (mL/min) 400 mL/min  Arterial Pressure (mmHg) -150 mmHg  Venous Pressure (mmHg) 160 mmHg  Transmembrane Pressure (mmHg) 50 mmHg  Ultrafiltration Rate (mL/min) 550 mL/min  Dialysate Flow Rate (mL/min) 800 ml/min  Conductivity: Machine  13.9  HD Safety Checks Performed Yes  Intra-Hemodialysis Comments Progressing as prescribed (b/p is stable; pt. remains restless)  Post-Hemodialysis Assessment  Rinseback Volume (mL) 250 mL  Dialyzer Clearance Lightly streaked  Duration of HD Treatment -hour(s) 3.5 hour(s)  Hemodialysis Intake (mL) 500 mL  UF Total -Machine (mL) 2000 mL  Net UF (mL) 1500 mL  Tolerated HD Treatment Yes  Education / Care Plan  Dialysis Education Provided Yes  Hemodialysis Catheter Left Internal jugular Triple-lumen  Placement Date/Time: 08/29/17 1400   Time Out: Correct patient;Correct site;Correct procedure  Maximum sterile barrier precautions: Hand hygiene;Sterile gloves;Cap;Large sterile sheet;Mask;Sterile gown  Site Prep: Chlorhexidine  Local Anesthetic: Inje...  Site Condition No complications  Blue Lumen Status Capped (Central line)  Red Lumen Status Capped (Central line)  Purple Lumen Status Capped (Central line)  Catheter fill solution Heparin 1000 units/ml  Catheter fill volume (Arterial) 1.4 cc  Catheter fill volume (Venous) 1.4  Dressing Type Biopatch  Dressing Status Clean;Dry;Intact  Drainage Description None  Post treatment catheter status Capped and Clamped

## 2017-09-02 NOTE — Progress Notes (Signed)
   09/02/17 1350  Clinical Encounter Type  Visited With Patient  Visit Type Follow-up   Chaplain saw note regarding patient extubation, chaplain checked in with patient, spouse not in room.  Patient expressed that she did not want a visit at present.  Chaplain will continue to monitor.

## 2017-09-02 NOTE — Progress Notes (Signed)
[facility] INFECTIOUS DISE[facility] PROGRESS NOTE Date of Admission:  08/22/2017     ID: Bonnie Holt is a 72 y.o. female with Fevers, AMS  Principal Problem:   Sepsis ([place]) Active Problems:   UTI (urinary tract infection)   CAD (coronary artery disease)   Diabetes ([place])   ARF (acute renal failure) (HCC)   Elevated liver function tests   Encounter for intubation   Severe sepsis ([place])   Acute respiratory failure with hypoxia (HCC)   Respiratory failure ([place])   Acute encephalopathy   Palliative care encounter   Subjective: Extubated, off pressors. On HD curently   ROS  Unable to obtain  Medications:  Antibiotics Given (last 72 hours)    Date/Time Action Medication Dose Rate   08/30/17 2205 New Bag/Given   doxycycline (VIBRAMYCIN) 100 mg in sodium chloride 0.9 % 250 mL IVPB 100 mg 125 mL/hr   08/31/17 1059 New Bag/Given   cefTRIAXone (ROCEPHIN) 1 g in sodium chloride 0.9 % 100 mL IVPB 1 g 200 mL/hr   08/31/17 1059 New Bag/Given   doxycycline (VIBRAMYCIN) 100 mg in sodium chloride 0.9 % 250 mL IVPB 100 mg 125 mL/hr   08/31/17 2138 New Bag/Given   doxycycline (VIBRAMYCIN) 100 mg in sodium chloride 0.9 % 250 mL IVPB 100 mg 125 mL/hr   09/01/17 1200 New Bag/Given   doxycycline (VIBRAMYCIN) 100 mg in sodium chloride 0.9 % 250 mL IVPB 100 mg 125 mL/hr   09/01/17 2243 New Bag/Given   doxycycline (VIBRAMYCIN) 100 mg in sodium chloride 0.9 % 250 mL IVPB 100 mg 125 mL/hr   09/02/17 1126 New Bag/Given   doxycycline (VIBRAMYCIN) 100 mg in sodium chloride 0.9 % 250 mL IVPB 100 mg 125 mL/hr     . B-complex with vitamin C  1 tablet Per Tube QHS  . chlorhexidine  15 mL Mouth Rinse BID  . chlorhexidine gluconate (MEDLINE KIT)  15 mL Mouth Rinse BID  . Chlorhexidine Gluconate Cloth  6 each Topical Q0600  . feeding supplement (NEPRO CARB STEADY)  1,000 mL Per Tube Q24H  . feeding supplement (PRO-STAT SUGAR FREE 64)  30 mL Per Tube Daily  . free water  200 mL Per Tube Q8H  . insulin aspart   0-15 Units Subcutaneous Q4H  . insulin aspart  3 Units Subcutaneous Q4H  . insulin glargine  10 Units Subcutaneous Daily  . mouth rinse  15 mL Mouth Rinse 10 times per day  . pantoprazole sodium  40 mg Per Tube Daily  . sodium chloride flush  10-40 mL Intracatheter Q12H    Objective: Vital signs in last 24 hours: Temp:  [99.1 F (37.3 C)-100.4 F (38 C)] 99.3 F (37.4 C) (06/20 1315) Pulse Rate:  [30-178] 74 (06/20 1315) Resp:  [14-27] 24 (06/20 1315) BP: (69-192)/(40-130) 123/56 (06/20 1315) SpO2:  [94 %-100 %] 97 % (06/20 1315) FiO2 (%):  [40 %] 40 % (06/20 0734) Weight:  [107.9 kg (237 lb 14 oz)-109.7 kg (241 lb 13.5 oz)] 107.9 kg (237 lb 14 oz) (06/20 1308) Constitutional: confused but eyes open HENT: [place]/AT, PERRLA, no scleral icterus Mouth/Throat: Oropharynx is clear and dry. No oropharyngeal exudate.  Cardiovascular: Normal rate, regular rhythm and normal heart sounds.  Pulmonary/Chest: Effort normal and breath sounds normal. Neck = supple, no nuchal rigidity Abdominal: Soft. Bowel sounds are normal.  exhibits no distension. There is no tenderness.  Lymphadenopathy: no cervical adenopathy. No axillary adenopathy Neurological: confused,  Skin: Skin is warm and dry. No rash  noted. No erythema.  Psychiatric: confused  Lab Results Recent Labs    09/01/17 0310 09/02/17 0435 09/02/17 0644  WBC 13.6* 13.7*  --   HGB 9.0* 8.5*  --   HCT 26.9* 25.0*  --   NA 142  --  136  K 3.4*  --  3.4*  CL 104  --  102  CO2 26  --  22  BUN 60*  --  53*  CREATININE 6.14*  --  5.25*    Microbiology: Results for orders placed or performed during the hospital encounter of 08/22/17  Blood Culture (routine x 2)     Status: None   Collection Time: 08/22/17 12:52 PM  Result Value Ref Range Status   Specimen Description BLOOD RIGHT HAND  Final   Special Requests   Final    BOTTLES DRAWN AEROBIC AND ANAEROBIC Blood Culture adequate volume   Culture   Final    NO GROWTH 5  DAYS Performed at [facility], [address] [place], [place] [address]    Report Status 08/27/2017 FINAL  Final  Urine culture     Status: Abnormal   Collection Time: 08/22/17 12:52 PM  Result Value Ref Range Status   Specimen Description   Final    URINE, RANDOM Performed at [facility], [address], [place], [place] [address]    Special Requests   Final    NONE Performed at [facility], [address], [place], [place] [address]    Culture (A)  Final    >=100,000 COLONIES/mL KLEBSIELLA PNEUMONIAE 60,000 COLONIES/mL PROTEUS MIRABILIS    Report Status 08/24/2017 FINAL  Final   Organism ID, Bacteria KLEBSIELLA PNEUMONIAE (A)  Final   Organism ID, Bacteria PROTEUS MIRABILIS (A)  Final      Susceptibility   Klebsiella pneumoniae - MIC*    AMPICILLIN RESISTANT Resistant     CEFAZOLIN <=4 SENSITIVE Sensitive     CEFTRIAXONE <=1 SENSITIVE Sensitive     CIPROFLOXACIN <=0.25 SENSITIVE Sensitive     GENTAMICIN <=1 SENSITIVE Sensitive     IMIPENEM <=0.25 SENSITIVE Sensitive     NITROFURANTOIN 64 INTERMEDIATE Intermediate     TRIMETH/SULFA <=20 SENSITIVE Sensitive     AMPICILLIN/SULBACTAM 4 SENSITIVE Sensitive     PIP/TAZO <=4 SENSITIVE Sensitive     Extended ESBL NEGATIVE Sensitive     * >=100,000 COLONIES/mL KLEBSIELLA PNEUMONIAE   Proteus mirabilis - MIC*    AMPICILLIN <=2 SENSITIVE Sensitive     CEFAZOLIN <=4 SENSITIVE Sensitive     CEFTRIAXONE <=1 SENSITIVE Sensitive     CIPROFLOXACIN <=0.25 SENSITIVE Sensitive     GENTAMICIN <=1 SENSITIVE Sensitive     IMIPENEM 1 SENSITIVE Sensitive     NITROFURANTOIN 128 RESISTANT Resistant     TRIMETH/SULFA <=20 SENSITIVE Sensitive     AMPICILLIN/SULBACTAM <=2 SENSITIVE Sensitive     PIP/TAZO <=4 SENSITIVE Sensitive     * 60,000 COLONIES/mL PROTEUS MIRABILIS  Blood Culture (routine x 2)     Status: None   Collection Time: 08/22/17 12:53 PM  Result Value Ref Range Status   Specimen Description  BLOOD RIGHT ARM  Final   Special Requests   Final    BOTTLES DRAWN AEROBIC AND ANAEROBIC Blood Culture results may not be optimal due to an excessive volume of blood received in culture bottles   Culture   Final    NO GROWTH 5 DAYS Performed at [facility], [address], [place], [place] [address]  Report Status 08/27/2017 FINAL  Final  MRSA PCR Screening     Status: None   Collection Time: 08/22/17  9:18 PM  Result Value Ref Range Status   MRSA by PCR NEGATIVE NEGATIVE Final    Comment:        The GeneXpert MRSA Assay (FDA approved for NASAL specimens only), is one component of a comprehensive MRSA colonization surveillance program. It is not intended to diagnose MRSA infection nor to guide or monitor treatment for MRSA infections. Performed at [facility], [address] [place], [place] [address]   Culture, blood (Routine X 2) w Reflex to ID Panel     Status: None (Preliminary result)   Collection Time: 08/29/17  9:31 PM  Result Value Ref Range Status   Specimen Description BLOOD LEFT FOOT  Final   Special Requests   Final    BOTTLES DRAWN AEROBIC AND ANAEROBIC Blood Culture adequate volume   Culture   Final    NO GROWTH 4 DAYS Performed at [facility], [address], [place], [place] [address]    Report Status PENDING  Incomplete  Culture, blood (Routine X 2) w Reflex to ID Panel     Status: None (Preliminary result)   Collection Time: 08/29/17  9:50 PM  Result Value Ref Range Status   Specimen Description BLOOD RIGHT ANKLE  Final   Special Requests   Final    BOTTLES DRAWN AEROBIC AND ANAEROBIC Blood Culture adequate volume   Culture   Final    NO GROWTH 4 DAYS Performed at [facility], [address] [place], [place] [address]    Report Status PENDING  Incomplete     Studies/Results: Mr Brain Wo Contrast  Result Date: 09/01/2017 CLINICAL DATA:  Initial evaluation for acute altered mental status, fever, tech  bite. EXAM: MRI HEAD WITHOUT CONTRAST TECHNIQUE: Multiplanar, multiecho pulse sequences of the brain and surrounding structures were obtained without intravenous contrast. COMPARISON:  Prior CT from 08/14/2017. FINDINGS: Brain: Examination moderately degraded by motion artifact. No evidence for acute infarct. Gray-white matter differentiation maintained. Small remote lacunar infarct noted within the left caudate. No other discernible areas of chronic infarction. No acute or chronic intracranial hemorrhage. No appreciable mass lesion on this motion degraded exam. No mass effect or midline shift. No hydrocephalus. No extra-axial collection. Normal pituitary gland. Vascular: Major intracranial vascular flow voids maintained at the skull base. Skull and upper cervical spine: Craniocervical junction within normal limits. Upper cervical spine normal. No marrow replacing lesion. Scalp soft tissues unremarkable. Sinuses/Orbits: Globes and orbital soft tissues grossly within normal limits. Scattered mucosal thickening within the ethmoidal air cells and sphenoid sinuses. Fluid seen within the nasopharynx. Nasogastric tube in place. Bilateral mastoid effusions noted. Other: None. IMPRESSION: 1. Limited study due to extensive motion artifact. No definite acute intracranial abnormality. 2. 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 Electronically Signed   By: Jeannine Boga M.D.   On: 09/01/2017 15:37   Dg Chest Port 1 View  Result Date: 09/02/2017 CLINICAL DATA:  Respiratory failure EXAM: PORTABLE CHEST 1 VIEW COMPARISON:  Two days ago FINDINGS: Cardiomegaly and thickened hila, likely vascular. There is a left pleural effusion that is small to moderate. No Kerley lines or air bronchogram. Left IJ dialysis catheter with tip near the upper SVC. Right upper extremity PICC in good position. Endotracheal tube tip is between the clavicular heads and carina. An orogastric tube reaches the stomach. IMPRESSION: 1. Stable  cardiomegaly with vascular congestion.Stable left  pleural effusion. 2. Unremarkable positioning of hardware. Electronically Signed   By: Monte Fantasia M.D.   On: 09/02/2017 07:34   Dg Chest Port 1 View  Result Date: 08/31/2017 CLINICAL DATA:  Intubation, history breast cancer, coronary artery disease, diabetes mellitus, hypertension EXAM: PORTABLE CHEST 1 VIEW COMPARISON:  Portable exam 1709 hours compared to 0341 hours FINDINGS: Tip of endotracheal tube is at the carina recommend withdrawal 2.0-2.5 cm. Nasogastric tube extends into stomach. RIGHT arm PICC line tip projects over SVC. LEFT jugular central venous catheter tip projects over SVC. Numerous EKG leads project over chest. Enlargement of cardiac silhouette. Stable mediastinal contours and pulmonary vascularity. Question minimal pulmonary edema with bibasilar effusions and atelectasis. No pneumothorax. IMPRESSION: Probable bibasilar effusions and atelectasis. Tip of endotracheal tube is at the carina, recommend withdrawal 2.0-2.5 cm. Findings called to patient's nurse Tanali RN in ICU on 08/31/2017 at 1738 hrs. Electronically Signed   By: Lavonia Dana M.D.   On: 08/31/2017 17:39    Assessment/Plan: IKEYA BROCKEL is a 72 y.o. female admitted with  fevers, chills, unsteady gait x 3 days.  Had recent tick bite. Admitted with sepsis and started IV abx. Developed progressive SOB and transferred to ICU.  She has had recurrent fevers 6/9-6/12 but then deferevesed after starting doxy. On admit wbc was 3.5, plts 55, UA 0-5 wbc. cr elevated and had continued to increase.  CXR was negative on admit but then developed pulm edema, CT head neg. Echo with ef 50-55 but poor study.  I suspect she has Ehrlichia or RMSF.   Renal failure is out of proportion but likely multifactorial.  6/17- Much more altered. RUQ US showed fatty liver and stone in gb neck but no cholecystitis.  AST ALT improving. Alk phos went up a little. Started HD.  Doxy day 6, day 6 of ctx as  well. I think her confusion is likely from uremia.  6/18 cr down a little. More normothermic. Ehrlichia negative. Day 10 of admisison. Day 7 or doxy and ctx. 6/19 - intubated, no fevers, wbc up some. Had 2 more HD sessions. Plts imrpoving. CXR with fluid and CM. 6/20 -MRI brain poor study but neg, extubated  low grade fevers, wbc up to 13.  Recommendation Cont doxy for 10 days today Follow clinically Would repeat RMSF and ehrlichia serology at 3 weeks post admission Thank you very much for the consult. Will follow with you.  Leonel Ramsay   09/02/2017, 2:03 PM

## 2017-09-02 NOTE — Progress Notes (Signed)
Patient much more awake and alert this AM; Followed all commands. Denies pain.  Vitals:   09/02/17 1500 09/02/17 1515 09/02/17 1530 09/02/17 1545  BP: 115/73 136/67 (!) 147/57 (!) 141/68  Pulse: 88 86 93 91  Resp: 13 (!) 23 13 (!) 8  Temp:   99 F (37.2 C) 99.1 F (37.3 C)  TempSrc:      SpO2: 96% 100% 95% 95%  Weight:      Height:       Gen: Intubated, sedated, not F/C  HEENT: NCAT, sclera white, facial flushing persists Neck: JVP not visualized Lungs: breath sounds full anteriorly without wheezes or other adventitious sounds Cardiovascular: RRR, no murmurs Abdomen: Obese, soft, nontender, normal BS Ext: without clubbing, cyanosis, edema Neuro: Awake and appropriate Skin: Limited exam, no lesions noted  The above documented exam has been reviewed entirely and remains accurate    BMP Latest Ref Rng & Units 09/02/2017 09/01/2017 08/31/2017  Glucose 65 - 99 mg/dL 159(H) 124(H) 225(H)  BUN 6 - 20 mg/dL 53(H) 60(H) 72(H)  Creatinine 0.44 - 1.00 mg/dL 5.25(H) 6.14(H) 7.97(H)  Sodium 135 - 145 mmol/L 136 142 140  Potassium 3.5 - 5.1 mmol/L 3.4(L) 3.4(L) 3.3(L)  Chloride 101 - 111 mmol/L 102 104 104  CO2 22 - 32 mmol/L '22 26 22  '$ Calcium 8.9 - 10.3 mg/dL 7.6(L) 7.8(L) 7.2(L)    CBC Latest Ref Rng & Units 09/02/2017 09/01/2017 08/31/2017  WBC 3.6 - 11.0 K/uL 13.7(H) 13.6(H) 10.2  Hemoglobin 12.0 - 16.0 g/dL 8.5(L) 9.0(L) 10.0(L)  Hematocrit 35.0 - 47.0 % 25.0(L) 26.9(L) 29.1(L)  Platelets 150 - 440 K/uL 101(L) 108(L) 86(L)    Hepatic Function Latest Ref Rng & Units 09/01/2017 08/31/2017 08/29/2017  Total Protein 6.5 - 8.1 g/dL 6.7 6.3(L) 6.0(L)  Albumin 3.5 - 5.0 g/dL 2.9(L) 2.8(L) 2.7(L)  AST 15 - 41 U/L 40 48(H) 69(H)  ALT 14 - 54 U/L 8(L) 11(L) 12(L)  Alk Phosphatase 38 - 126 U/L 110 112 134(H)  Total Bilirubin 0.3 - 1.2 mg/dL 0.8 0.8 0.6   Results for orders placed or performed during the hospital encounter of 08/22/17  Blood Culture (routine x 2)     Status: None   Collection  Time: 08/22/17 12:52 PM  Result Value Ref Range Status   Specimen Description BLOOD RIGHT HAND  Final   Special Requests   Final    BOTTLES DRAWN AEROBIC AND ANAEROBIC Blood Culture adequate volume   Culture   Final    NO GROWTH 5 DAYS Performed at [facility], [address] [place], [place] [address]    Report Status 08/27/2017 FINAL  Final  Urine culture     Status: Abnormal   Collection Time: 08/22/17 12:52 PM  Result Value Ref Range Status   Specimen Description   Final    URINE, RANDOM Performed at [facility], [address] [place], [place] [address]    Special Requests   Final    NONE Performed at [facility], [address] [place], [place] [address]    Culture (A)  Final    >=100,000 COLONIES/mL KLEBSIELLA PNEUMONIAE 60,000 COLONIES/mL PROTEUS MIRABILIS    Report Status 08/24/2017 FINAL  Final   Organism ID, Bacteria KLEBSIELLA PNEUMONIAE (A)  Final   Organism ID, Bacteria PROTEUS MIRABILIS (A)  Final      Susceptibility   Klebsiella pneumoniae - MIC*    AMPICILLIN RESISTANT Resistant     CEFAZOLIN <=4 SENSITIVE Sensitive  CEFTRIAXONE <=1 SENSITIVE Sensitive     CIPROFLOXACIN <=0.25 SENSITIVE Sensitive     GENTAMICIN <=1 SENSITIVE Sensitive     IMIPENEM <=0.25 SENSITIVE Sensitive     NITROFURANTOIN 64 INTERMEDIATE Intermediate     TRIMETH/SULFA <=20 SENSITIVE Sensitive     AMPICILLIN/SULBACTAM 4 SENSITIVE Sensitive     PIP/TAZO <=4 SENSITIVE Sensitive     Extended ESBL NEGATIVE Sensitive     * >=100,000 COLONIES/mL KLEBSIELLA PNEUMONIAE   Proteus mirabilis - MIC*    AMPICILLIN <=2 SENSITIVE Sensitive     CEFAZOLIN <=4 SENSITIVE Sensitive     CEFTRIAXONE <=1 SENSITIVE Sensitive     CIPROFLOXACIN <=0.25 SENSITIVE Sensitive     GENTAMICIN <=1 SENSITIVE Sensitive     IMIPENEM 1 SENSITIVE Sensitive     NITROFURANTOIN 128 RESISTANT Resistant     TRIMETH/SULFA <=20 SENSITIVE Sensitive     AMPICILLIN/SULBACTAM <=2 SENSITIVE  Sensitive     PIP/TAZO <=4 SENSITIVE Sensitive     * 60,000 COLONIES/mL PROTEUS MIRABILIS  Blood Culture (routine x 2)     Status: None   Collection Time: 08/22/17 12:53 PM  Result Value Ref Range Status   Specimen Description BLOOD RIGHT ARM  Final   Special Requests   Final    BOTTLES DRAWN AEROBIC AND ANAEROBIC Blood Culture results may not be optimal due to an excessive volume of blood received in culture bottles   Culture   Final    NO GROWTH 5 DAYS Performed at [facility], [address] [place], [place] [address]    Report Status 08/27/2017 FINAL  Final  MRSA PCR Screening     Status: None   Collection Time: 08/22/17  9:18 PM  Result Value Ref Range Status   MRSA by PCR NEGATIVE NEGATIVE Final    Comment:        The GeneXpert MRSA Assay (FDA approved for NASAL specimens only), is one component of a comprehensive MRSA colonization surveillance program. It is not intended to diagnose MRSA infection nor to guide or monitor treatment for MRSA infections. Performed at [facility], [address] [place], [place] [address]   Culture, blood (Routine X 2) w Reflex to ID Panel     Status: None (Preliminary result)   Collection Time: 08/29/17  9:31 PM  Result Value Ref Range Status   Specimen Description BLOOD LEFT FOOT  Final   Special Requests   Final    BOTTLES DRAWN AEROBIC AND ANAEROBIC Blood Culture adequate volume   Culture   Final    NO GROWTH 4 DAYS Performed at [facility], [address] [place], [place] [address]    Report Status PENDING  Incomplete  Culture, blood (Routine X 2) w Reflex to ID Panel     Status: None (Preliminary result)   Collection Time: 08/29/17  9:50 PM  Result Value Ref Range Status   Specimen Description BLOOD RIGHT ANKLE  Final   Special Requests   Final    BOTTLES DRAWN AEROBIC AND ANAEROBIC Blood Culture adequate volume   Culture   Final    NO GROWTH 4 DAYS Performed at [facility], [address] [place], [place] [address]    Report Status PENDING  Incomplete    Anti-infectives (From admission, onward)   Start     Dose/Rate Route Frequency Ordered Stop   08/25/17 1100  cefTRIAXone (ROCEPHIN) 1 g in sodium chloride 0.9 % 100 mL IVPB  Status:  Discontinued     1  g 200 mL/hr over 30 Minutes Intravenous Every 24 hours 08/25/17 1048 08/31/17 1538   08/25/17 1100  doxycycline (VIBRAMYCIN) 100 mg in sodium chloride 0.9 % 250 mL IVPB     100 mg 125 mL/hr over 120 Minutes Intravenous Every 12 hours 08/25/17 1057     08/24/17 1230  ceFAZolin (ANCEF) IVPB 1 g/50 mL premix  Status:  Discontinued     1 g 100 mL/hr over 30 Minutes Intravenous Every 12 hours 08/24/17 1224 08/25/17 1048   08/22/17 2200  piperacillin-tazobactam (ZOSYN) IVPB 3.375 g  Status:  Discontinued     3.375 g 12.5 mL/hr over 240 Minutes Intravenous Every 8 hours 08/22/17 1509 08/24/17 1221   08/22/17 2200  vancomycin (VANCOCIN) IVPB 1000 mg/200 mL premix  Status:  Discontinued     1,000 mg 200 mL/hr over 60 Minutes Intravenous Every 24 hours 08/22/17 1509 08/23/17 2008   08/22/17 1400  piperacillin-tazobactam (ZOSYN) IVPB 3.375 g     3.375 g 100 mL/hr over 30 Minutes Intravenous  Once 08/22/17 1355 08/22/17 1431   08/22/17 1400  vancomycin (VANCOCIN) IVPB 1000 mg/200 mL premix     1,000 mg 200 mL/hr over 60 Minutes Intravenous  Once 08/22/17 1355 08/22/17 1510      CXR: NNF  MRI brain 06/19: No definite acute intracranial abnormality. 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  IMPRESSION: Severe sepsis of unclear etiology Recent tick bite - concern for ehrlichiosis (serologies negative) vs RSMF ID F/U Probable LLL PNA - treated Kelbsiella/proteus UTI - fully treated Elevated PCT - improving Acute encephalopathy much improved Elevated trop I, likely demand ischemia - resolving Pulmonary edema, resolving Chronic LBBB PAF with RVR 6/19 > NSR after amiodarone initiated Acute renal failure,  now on HD DM 2, controlled Elevated transaminases, resolved Thrombocytopenia, improving Acute protein-calorie malnutrition Low TSH, low free T4 of unclear etiology. Free T3 pending Diarrhoea Pyrexia- will repeat cultures H/O breast Ca H/O CAD  PLAN/REC: SBT for extubation Will need ischemia eval after recovers from this acute illness Monitor BMET intermittently Monitor I/Os Correct electrolytes as indicated HD per Nephrology  Monitor temp, WBC count Micro and abx as above Per ID Service - complete 10 days of doxycycline (through 6/21) DVT px: SCDs Monitor CBC intermittently Transfuse per usual guidelines Cont TF protocol Check for C- Diff Repeat cultures   Family: Husband updated at bedside.  CCM time: 60 mins The above time includes time spent in consultation with patient and/or family members and reviewing care plan on multidisciplinary rounds  Leroy Libman, MD PCCM service Pager [phone] 09/02/2017 4:02 PM

## 2017-09-02 NOTE — Progress Notes (Signed)
Daily Progress Note   Patient Name: Bonnie Holt       Date: 09/02/2017 DOB: Feb 07, 1946  Age: 72 y.o. MRN#: [id number] Attending Physician: [name], MD Primary Care Physician: Kirk Ruths, MD Admit Date: 08/22/2017  Reason for Consultation/Follow-up: Establishing goals of care and Psychosocial/spiritual support  Subjective: Patient awake, able to follow some commands.  Clearly does not like the ETT or mitts.  Husband very encouraged at wife's change.  I cautioned him that her kidneys still need to "wake up".    He wants to remain positive, but is also very receptive to CCM's guidance.   Assessment:  Critically ill female, awake, slightly agitated, acute renal failure.  Hard to assess encephalopathy given precedex and fentanyl.    Acute illness resolving, but urine output has not increased yet.  Receiving HD per nephrology.  Currently intubated, pressors, N/G in place for feeding.   Hospital day 11.    MRI was a degraded exam, showed remote stroke but no tumor, bleed, stroke.  Husband hopeful for improvement.   Patient Profile/HPI:  72 y.o.femalewith past medical history of CAD sp stent placement, BRCA in Fall 2018 s/p radiation therapy now on Femara, and OSA cpap compliantwho was admitted on 6/9/2019with lethargy and edema. She was found to be in acute renal failure with acidosis. Her troponin was 2.33 secondary to demand ischemia. Her lactic acid was 6.81. She became increasingly anuric and hemodialysis was started on 6/16. She did not tolerate the first treatment well due to hypotension.  She has tolerated following treatments.  She was intubated overnight 6/18.  Currently remains intubated.   Length of Stay: 11  Current Medications: Scheduled Meds:  .  B-complex with vitamin C  1 tablet Per Tube QHS  . chlorhexidine  15 mL Mouth Rinse BID  . chlorhexidine gluconate (MEDLINE KIT)  15 mL Mouth Rinse BID  . Chlorhexidine Gluconate Cloth  6 each Topical Q0600  . feeding supplement (NEPRO CARB STEADY)  1,000 mL Per Tube Q24H  . feeding supplement (PRO-STAT SUGAR FREE 64)  30 mL Per Tube Daily  . free water  200 mL Per Tube Q8H  . insulin aspart  0-15 Units Subcutaneous Q4H  . insulin aspart  3 Units Subcutaneous Q4H  . insulin glargine  10 Units Subcutaneous Daily  .  mouth rinse  15 mL Mouth Rinse 10 times per day  . pantoprazole sodium  40 mg Per Tube Daily  . sodium chloride flush  10-40 mL Intracatheter Q12H    Continuous Infusions: . amiodarone 30 mg/hr (09/02/17 0820)  . dexmedetomidine (PRECEDEX) IV infusion 0.1 mcg/kg/hr (09/02/17 1114)  . doxycycline (VIBRAMYCIN) IV Stopped (09/02/17 0043)  . phenylephrine (NEO-SYNEPHRINE) Adult infusion 5 mcg/min (09/02/17 1030)    PRN Meds: acetaminophen **OR** acetaminophen, bisacodyl, fentaNYL (SUBLIMAZE) injection, heparin lock flush, hydrALAZINE, ipratropium-albuterol, metoprolol tartrate, [DISCONTINUED] ondansetron **OR** ondansetron (ZOFRAN) IV, sodium chloride flush  Physical Exam       Well developed obese female, awake, responsive, encephalopathic (on precedex/fentanyl), agitated by ETT and mitts.   Throws both arms in the air.  CV rrr, no m/r/g resp no distress, intubated Abdomen obese, soft, nd, nt Ext able to move all 4  Vital Signs: BP (!) 137/50   Pulse 66   Temp (!) 100.4 F (38 C)   Resp (!) 26   Ht _0  (1.575 m)   Wt 109.7 kg (241 lb 13.5 oz)   SpO2 97%   BMI 44.23 kg/m  SpO2: SpO2: 97 % O2 Device: O2 Device: Ventilator O2 Flow Rate: O2 Flow Rate (L/min): 6 L/min  Intake/output summary:   Intake/Output Summary (Last 24 hours) at 09/02/2017 1117 Last data filed at 09/02/2017 0457 Gross per 24 hour  Intake 1954.68 ml  Output 1658 ml  Net 296.68 ml   LBM:  Last BM Date: 09/01/17 Baseline Weight: Weight: 100.2 kg (221 lb) Most recent weight: Weight: 109.7 kg (241 lb 13.5 oz)       Palliative Assessment/Data: 20%   Flowsheet Rows     Most Recent Value  Intake Tab  Referral Department  Hospitalist  Unit at Time of Referral  Med/Surg Unit  Palliative Care Primary Diagnosis  Nephrology  Date Notified  08/28/17  Palliative Care Type  New Palliative care  Reason for referral  Clarify Goals of Care  Date of Admission  08/22/17  Date first seen by Palliative Care  08/30/17  # of days Palliative referral response time  2 Day(s)  # of days IP prior to Palliative referral  6  Clinical Assessment  Psychosocial & Spiritual Assessment  Palliative Care Outcomes      Patient Active Problem List   Diagnosis Date Noted  . Respiratory failure ([place])   . Acute encephalopathy   . Severe sepsis ([place])   . Acute respiratory failure with hypoxia ([place])   . ARF (acute renal failure) ([place])   . Elevated liver function tests   . Encounter for intubation   . Sepsis ([place]) 08/22/2017  . UTI (urinary tract infection) 08/22/2017  . CAD (coronary artery disease) 08/22/2017  . Diabetes ([place]) 08/22/2017  . Primary cancer of upper outer quadrant of left female breast ([place]) 12/06/2016    Palliative Care Plan    Recommendations/Plan:  PMT will continue to follow intermittently with you supporting Bonnie Holt.  He is well informed, but still in some degree of denial. Fortunately he is listening well to Dr. Alva Garnet.   Goals of Care and Additional Recommendations:  Limitations on Scope of Treatment: Full Scope Treatment  Code Status:  Full code  Prognosis:   Unable to determine.  If her kidney function returns she will likely do well.  If not, she likely will not do well.  Bonnie Holt and Bonnie Holt are not interested in long term life support.  Discharge Planning:  To Be Determined.  If she improves she will need SNF  Care plan was discussed with Bonnie Holt at  bedside  Thank you for allowing the Palliative Medicine Team to assist in the care of this patient.  Total time spent:  35 min.     Greater than 50%  of this time was spent counseling and coordinating care related to the above assessment and plan.  Florentina Jenny, PA-C Palliative Medicine  Please contact Palliative MedicineTeam phone at [phone] for questions and concerns between 7 am - 7 pm.   Please see AMION for individual provider pager numbers.

## 2017-09-02 NOTE — Progress Notes (Signed)
Extubated without complications to 2lnc 

## 2017-09-02 NOTE — Progress Notes (Signed)
   09/02/17 1415  Clinical Encounter Type  Visited With Family (spouse)  Visit Type Follow-up  Spiritual Encounters  Spiritual Needs Emotional   Chaplain encountered patient's spouse in the hallway outside the unit.  Conversation around changes that spouse has observed in patient and his gratitude that she has been extubated.  Chaplain engaged spouse in therapeutic review of events since hospitalization.  Spouse referred to feeling as if he "hasn't been able to breathe until today."  He shared thoughts and feelings regarding the family support that he has, but how everyone was making suggestions for patient care when ultimately, as he said, the responsibility was his.  Patient spouse reported that they've only been married for a "short 90 years" and he hopes for many more years together.  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  Patient and spouse were also working partners during their time together and were rarely apart by spouse's report.  Spouse expressed feeling hopeful for many more years with spouse.  "There's still fun they need to have."  Chaplain's presence was requested by a staff member passing by and let spouse know that chaplains would continue to be available as needed.

## 2017-09-02 NOTE — Progress Notes (Signed)
[facility], [place] 09/02/17  Subjective:  Acute renal failure persists. Urine output was only 188 cc over the preceding 24 hours. Patient underwent dialysis today. Also has been extubated.  Objective:  Vital signs in last 24 hours:  Temp:  [98.2 F (36.8 C)-100.6 F (38.1 C)] 98.8 F (37.1 C) (06/20 1415) Pulse Rate:  [30-178] 80 (06/20 1415) Resp:  [12-27] 12 (06/20 1415) BP: (69-192)/(40-130) 127/55 (06/20 1415) SpO2:  [92 %-100 %] 100 % (06/20 1415) FiO2 (%):  [40 %] 40 % (06/20 0734) Weight:  [107.9 kg (237 lb 14 oz)-109.7 kg (241 lb 13.5 oz)] 107.9 kg (237 lb 14 oz) (06/20 1308)  Weight change: -0.6 kg (-1 lb 5.2 oz) Filed Weights   09/01/17 0549 09/02/17 0440 09/02/17 1308  Weight: 109 kg (240 lb 4.8 oz) 109.7 kg (241 lb 13.5 oz) 107.9 kg (237 lb 14 oz)    Intake/Output:    Intake/Output Summary (Last 24 hours) at 09/02/2017 1430 Last data filed at 09/02/2017 0457 Gross per 24 hour  Intake 1954.68 ml  Output 153 ml  Net 1801.68 ml   Physical Exam: General:  Critically ill-appearing, laying in the bed  HEENT  anicteric  Neck:  Supple  Lungs:  Scattered rhonchi, normal effort  Heart::  irregular  Abdomen:  Soft, nontender  Extremities:  1+ lower extremity edema  Neurologic:  lethargic but arousable  Skin:  No acute rashes    Basic Metabolic Panel:  Recent Labs  Lab 08/28/17 0831  08/29/17 2104 08/30/17 0652 08/30/17 1019 08/31/17 0400 09/01/17 0310 09/02/17 0644  NA 128*   < > 133* 139  --  140 142 136  K 3.7   < > 4.3 3.8  --  3.3* 3.4* 3.4*  CL 100*   < > 101 103  --  104 104 102  CO2 14*   < > 15* 20*  --  _0 GLUCOSE 253*   < > 213* 215*  --  225* 124* 159*  BUN 81*   < > 91* 93*  --  72* 60* 53*  CREATININE 9.31*   < > 10.07* 10.20*  --  7.97* 6.14* 5.25*  CALCIUM 7.1*   < > 7.7* 7.4*  --  7.2* 7.8* 7.6*  MG 2.5*  --  2.7*  --   --   --   --   --   PHOS 6.2*  --  7.7*  --  8.2*  --  3.9  --    < > =  values in this interval not displayed.     CBC: Recent Labs  Lab 08/29/17 0455 08/29/17 2104 08/31/17 0400 09/01/17 0310 09/02/17 0435  WBC 8.3 8.2 10.2 13.6* 13.7*  HGB 10.7* 10.4* 10.0* 9.0* 8.5*  HCT 31.5* 30.3* 29.1* 26.9* 25.0*  MCV 93.0 92.2 92.0 91.7 91.9  PLT 63* 78* 86* 108* 101*      Lab Results  Component Value Date   HEPBSAG Negative 08/29/2017   HEPBSAB Reactive 08/29/2017      Microbiology:  Recent Results (from the past 240 hour(s))  Culture, blood (Routine X 2) w Reflex to ID Panel     Status: None (Preliminary result)   Collection Time: 08/29/17  9:31 PM  Result Value Ref Range Status   Specimen Description BLOOD LEFT FOOT  Final   Special Requests   Final    BOTTLES DRAWN AEROBIC AND ANAEROBIC Blood Culture adequate volume   Culture   Final  NO GROWTH 4 DAYS Performed at [facility], [address] [place], [place] [address]    Report Status PENDING  Incomplete  Culture, blood (Routine X 2) w Reflex to ID Panel     Status: None (Preliminary result)   Collection Time: 08/29/17  9:50 PM  Result Value Ref Range Status   Specimen Description BLOOD RIGHT ANKLE  Final   Special Requests   Final    BOTTLES DRAWN AEROBIC AND ANAEROBIC Blood Culture adequate volume   Culture   Final    NO GROWTH 4 DAYS Performed at [facility], [address] [place], [place] [address]    Report Status PENDING  Incomplete    Coagulation Studies: No results for input(s): LABPROT, INR in the last 72 hours.  Urinalysis: No results for input(s): COLORURINE, LABSPEC, PHURINE, GLUCOSEU, HGBUR, BILIRUBINUR, KETONESUR, PROTEINUR, UROBILINOGEN, NITRITE, LEUKOCYTESUR in the last 72 hours.  Invalid input(s): APPERANCEUR    Imaging: Mr Brain Wo Contrast  Result Date: 09/01/2017 CLINICAL DATA:  Initial evaluation for acute altered mental status, fever, tech bite. EXAM: MRI HEAD WITHOUT CONTRAST TECHNIQUE: Multiplanar, multiecho pulse sequences  of the brain and surrounding structures were obtained without intravenous contrast. COMPARISON:  Prior CT from 08/14/2017. FINDINGS: Brain: Examination moderately degraded by motion artifact. No evidence for acute infarct. Gray-white matter differentiation maintained. Small remote lacunar infarct noted within the left caudate. No other discernible areas of chronic infarction. No acute or chronic intracranial hemorrhage. No appreciable mass lesion on this motion degraded exam. No mass effect or midline shift. No hydrocephalus. No extra-axial collection. Normal pituitary gland. Vascular: Major intracranial vascular flow voids maintained at the skull base. Skull and upper cervical spine: Craniocervical junction within normal limits. Upper cervical spine normal. No marrow replacing lesion. Scalp soft tissues unremarkable. Sinuses/Orbits: Globes and orbital soft tissues grossly within normal limits. Scattered mucosal thickening within the ethmoidal air cells and sphenoid sinuses. Fluid seen within the nasopharynx. Nasogastric tube in place. Bilateral mastoid effusions noted. Other: None. IMPRESSION: 1. Limited study due to extensive motion artifact. No definite acute intracranial abnormality. 2. 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 Electronically Signed   By: Jeannine Boga M.D.   On: 09/01/2017 15:37   Dg Chest Port 1 View  Result Date: 09/02/2017 CLINICAL DATA:  Respiratory failure EXAM: PORTABLE CHEST 1 VIEW COMPARISON:  Two days ago FINDINGS: Cardiomegaly and thickened hila, likely vascular. There is a left pleural effusion that is small to moderate. No Kerley lines or air bronchogram. Left IJ dialysis catheter with tip near the upper SVC. Right upper extremity PICC in good position. Endotracheal tube tip is between the clavicular heads and carina. An orogastric tube reaches the stomach. IMPRESSION: 1. Stable cardiomegaly with vascular congestion.Stable left pleural effusion. 2. Unremarkable  positioning of hardware. Electronically Signed   By: Monte Fantasia M.D.   On: 09/02/2017 07:34   Dg Chest Port 1 View  Result Date: 08/31/2017 CLINICAL DATA:  Intubation, history breast cancer, coronary artery disease, diabetes mellitus, hypertension EXAM: PORTABLE CHEST 1 VIEW COMPARISON:  Portable exam 1709 hours compared to 0341 hours FINDINGS: Tip of endotracheal tube is at the carina recommend withdrawal 2.0-2.5 cm. Nasogastric tube extends into stomach. RIGHT arm PICC line tip projects over SVC. LEFT jugular central venous catheter tip projects over SVC. Numerous EKG leads project over chest. Enlargement of cardiac silhouette. Stable mediastinal contours and pulmonary vascularity. Question minimal pulmonary edema with bibasilar effusions and atelectasis. No pneumothorax. IMPRESSION: Probable bibasilar effusions and atelectasis.  Tip of endotracheal tube is at the carina, recommend withdrawal 2.0-2.5 cm. Findings called to patient's nurse Tanali RN in ICU on 08/31/2017 at 1738 hrs. Electronically Signed   By: Lavonia Dana M.D.   On: 08/31/2017 17:39     Medications:   . amiodarone 30 mg/hr (09/02/17 0820)  . dexmedetomidine (PRECEDEX) IV infusion 0.1 mcg/kg/hr (09/02/17 1114)  . doxycycline (VIBRAMYCIN) IV 100 mg (09/02/17 1126)  . phenylephrine (NEO-SYNEPHRINE) Adult infusion 5 mcg/min (09/02/17 1030)   . B-complex with vitamin C  1 tablet Per Tube QHS  . chlorhexidine  15 mL Mouth Rinse BID  . chlorhexidine gluconate (MEDLINE KIT)  15 mL Mouth Rinse BID  . Chlorhexidine Gluconate Cloth  6 each Topical Q0600  . feeding supplement (NEPRO CARB STEADY)  1,000 mL Per Tube Q24H  . feeding supplement (PRO-STAT SUGAR FREE 64)  30 mL Per Tube Daily  . free water  200 mL Per Tube Q8H  . insulin aspart  0-15 Units Subcutaneous Q4H  . insulin aspart  3 Units Subcutaneous Q4H  . insulin glargine  10 Units Subcutaneous Daily  . mouth rinse  15 mL Mouth Rinse 10 times per day  . pantoprazole sodium   40 mg Per Tube Daily  . sodium chloride flush  10-40 mL Intracatheter Q12H   acetaminophen **OR** acetaminophen, bisacodyl, fentaNYL (SUBLIMAZE) injection, heparin lock flush, hydrALAZINE, ipratropium-albuterol, metoprolol tartrate, [DISCONTINUED] ondansetron **OR** ondansetron (ZOFRAN) IV, sodium chloride flush  Assessment/ Plan:  72 y.o. female with diabetes, coronary disease, sleep apnea, history of breast cancer, was admitted on 08/22/2017 with fever, chills, tick bite.   1.  Acute renal failure due to hypotension, sepsis. Remains oliguric 2.  Sepsis 3.  Urinary tract infection-Klebsiella, Proteus 4.  Elevated troponin/demand ischemia 5.  Hypotension, on pressors 6.  Thrombocytopenia 7.  Hyponatremia, Hypokalemia 8.  Lower extremity Edema 9.  Acute respiratory failure, placed on ventilator 08/31/17, extubated 09/02/17.  -Patient continues to have severe acute renal failure.  Oliguria persists.  Therefore she underwent hemodialysis earlier today.  We will reassess the patient for dialysis tomorrow.  Patient is now extubated.  Hopefully she can remain off of the ventilator.  Patient remains on doxycycline at this point in time.  Continue pressors to maintain a map of 65 or greater.  Otherwise continue supportive care as per critical care.     LOS: [place] 6/20/20192:30 PM  [place], [place]  Note: This note was prepared with Dragon dictation. Any transcription errors are unintentional

## 2017-09-03 DIAGNOSIS — R197 Diarrhea, unspecified: Secondary | ICD-10-CM

## 2017-09-03 LAB — RENAL FUNCTION PANEL
ANION GAP: 12 (ref 5–15)
Albumin: 2.3 g/dL — ABNORMAL LOW (ref 3.5–5.0)
BUN: 34 mg/dL — ABNORMAL HIGH (ref 6–20)
CALCIUM: 8.1 mg/dL — AB (ref 8.9–10.3)
CHLORIDE: 99 mmol/L — AB (ref 101–111)
CO2: 26 mmol/L (ref 22–32)
CREATININE: 3.72 mg/dL — AB (ref 0.44–1.00)
GFR, EST AFRICAN AMERICAN: 13 mL/min — AB (ref >60)
GFR, EST NON AFRICAN AMERICAN: 11 mL/min — AB (ref >60)
Glucose, Bld: 127 mg/dL — ABNORMAL HIGH (ref 65–99)
Phosphorus: 4.1 mg/dL (ref 2.5–4.6)
Potassium: 3.2 mmol/L — ABNORMAL LOW (ref 3.5–5.1)
Sodium: 137 mmol/L (ref 135–145)

## 2017-09-03 LAB — GLUCOSE, CAPILLARY
GLUCOSE-CAPILLARY: 119 mg/dL — AB (ref 65–99)
GLUCOSE-CAPILLARY: 176 mg/dL — AB (ref 65–99)
GLUCOSE-CAPILLARY: 133 mg/dL — AB (ref 65–99)
Glucose-Capillary: 108 mg/dL — ABNORMAL HIGH (ref 65–99)
Glucose-Capillary: 116 mg/dL — ABNORMAL HIGH (ref 65–99)
Glucose-Capillary: 118 mg/dL — ABNORMAL HIGH (ref 65–99)
Glucose-Capillary: 129 mg/dL — ABNORMAL HIGH (ref 65–99)
Glucose-Capillary: 148 mg/dL — ABNORMAL HIGH (ref 65–99)

## 2017-09-03 LAB — CBC WITH DIFFERENTIAL/PLATELET
BASOS PCT: 0 %
Basophils Absolute: 0 10*3/uL (ref 0–0.1)
EOS ABS: 0.1 10*3/uL (ref 0–0.7)
Eosinophils Relative: 1 %
HEMATOCRIT: 24.3 % — AB (ref 35.0–47.0)
HEMOGLOBIN: 8.3 g/dL — AB (ref 12.0–16.0)
Lymphocytes Relative: 8 %
Lymphs Abs: 0.9 10*3/uL — ABNORMAL LOW (ref 1.0–3.6)
MCH: 31.4 pg (ref 26.0–34.0)
MCHC: 34.2 g/dL (ref 32.0–36.0)
MCV: 91.7 fL (ref 80.0–100.0)
Monocytes Absolute: 1 10*3/uL — ABNORMAL HIGH (ref 0.2–0.9)
Monocytes Relative: 9 %
NEUTROS ABS: 9.1 10*3/uL — AB (ref 1.4–6.5)
NEUTROS PCT: 82 %
Platelets: 102 10*3/uL — ABNORMAL LOW (ref 150–440)
RBC: 2.65 MIL/uL — ABNORMAL LOW (ref 3.80–5.20)
RDW: 15.9 % — ABNORMAL HIGH (ref 11.5–14.5)
WBC: 11.1 10*3/uL — AB (ref 3.6–11.0)

## 2017-09-03 LAB — PHOSPHORUS: Phosphorus: 4.2 mg/dL (ref 2.5–4.6)

## 2017-09-03 LAB — CULTURE, BLOOD (ROUTINE X 2)
CULTURE: NO GROWTH
CULTURE: NO GROWTH
Special Requests: ADEQUATE
Special Requests: ADEQUATE

## 2017-09-03 LAB — PROTEIN / CREATININE RATIO, URINE
CREATININE, URINE: 75 mg/dL
PROTEIN CREATININE RATIO: 1.88 mg/mg{creat} — AB (ref 0.00–0.15)
TOTAL PROTEIN, URINE: 141 mg/dL

## 2017-09-03 LAB — MAGNESIUM: MAGNESIUM: 1.9 mg/dL (ref 1.7–2.4)

## 2017-09-03 MED ORDER — LETROZOLE 2.5 MG PO TABS
2.5000 mg | ORAL_TABLET | Freq: Every day | ORAL | Status: DC
  Administered 2017-09-03 – 2017-09-15 (×10): 2.5 mg via ORAL
  Filled 2017-09-03 (×13): qty 1

## 2017-09-03 MED ORDER — HEPARIN SODIUM (PORCINE) 5000 UNIT/ML IJ SOLN
5000.0000 [IU] | Freq: Three times a day (TID) | INTRAMUSCULAR | Status: DC
  Administered 2017-09-03 – 2017-09-07 (×12): 5000 [IU] via SUBCUTANEOUS
  Filled 2017-09-03 (×12): qty 1

## 2017-09-03 MED ORDER — TUBERCULIN PPD 5 UNIT/0.1ML ID SOLN
5.0000 [IU] | Freq: Once | INTRADERMAL | Status: AC
  Administered 2017-09-03: 5 [IU] via INTRADERMAL
  Filled 2017-09-03: qty 0.1

## 2017-09-03 MED ORDER — NYSTATIN 100000 UNIT/GM EX CREA
TOPICAL_CREAM | Freq: Two times a day (BID) | CUTANEOUS | Status: DC
  Administered 2017-09-03 – 2017-09-04 (×2): via TOPICAL
  Administered 2017-09-04 – 2017-09-05 (×2): 1 via TOPICAL
  Administered 2017-09-05 – 2017-09-06 (×2): via TOPICAL
  Administered 2017-09-06: 1 via TOPICAL
  Administered 2017-09-07: 11:00:00 via TOPICAL
  Administered 2017-09-07 – 2017-09-08 (×2): 1 via TOPICAL
  Administered 2017-09-08 – 2017-09-15 (×13): via TOPICAL
  Filled 2017-09-03: qty 15

## 2017-09-03 NOTE — Progress Notes (Signed)
OT Cancellation Note  Patient Details Name: Bonnie Holt MRN: [id number] DOB: 12/17/1945   Cancelled Treatment:    Reason Eval/Treat Not Completed: Other (comment). Order received, chart reviewed. Spoke with RN, not sure when pt will have dialysis. Upon attempt, pt working with PT. Will re-attempt at later date/time as pt is available and medically appropriate.   Jeni Salles, MPH, MS, OTR/L ascom [phone] 09/03/17, 3:50 PM

## 2017-09-03 NOTE — Progress Notes (Signed)
[facility] NAME: Juna Caban    MR#:  [id number]  DATE OF BIRTH:  Aug 08, 1945  SUBJECTIVE:   Remains extubated and now following commands. Still has poor urine output and plan for HD again. Off vasopressors and Precedex.  Remains on Amio gtt.   REVIEW OF SYSTEMS:   Review of Systems  Unable to perform ROS: Mental acuity   Tolerating Diet: Yes Tolerating PT: Await Eval.  DRUG ALLERGIES:   Allergies  Allergen Reactions  . Ace Inhibitors Cough    VITALS:  Blood pressure (!) 186/75, pulse 81, temperature 98.3 F (36.8 C), resp. rate 19, height 5\' 2"  (1.575 m), weight 107.8 kg (237 lb 10.5 oz), SpO2 96 %.  PHYSICAL EXAMINATION:   Physical Exam  GENERAL:  72 y.o.-year-old obese patient sitting in chair in NAD.  EYES: Pupils equal, round, reactive to light. No scleral icterus.  HEENT: Head atraumatic, normocephalic. Moist Oral Mucosa.  NECK:  Supple, no jugular venous distention. No thyroid enlargement, no tenderness.  LUNGS: Good a/e B/l,  no wheezing, rales, rhonchi. No use of accessory muscles of respiration.  CARDIOVASCULAR: S1, S2 normal. No murmurs, rubs, or gallops.  ABDOMEN: Soft, nontender, nondistended. Bowel sounds present. No organomegaly or mass.  EXTREMITIES: No cyanosis, clubbing, +2 edema from ankles to knees b/l.    NEUROLOGIC: Cranial nerves II through XII intact, follows simple commands, no focal motor or sensory deficits appreciated bilaterally.  Globally weak. PSYCHIATRIC: AAO X 2.    SKIN: No obvious rash, lesion, or ulcer.   LABORATORY PANEL:  CBC Recent Labs  Lab 09/03/17 0423  WBC 11.1*  HGB 8.3*  HCT 24.3*  PLT 102*    Chemistries  Recent Labs  Lab 09/01/17 0310  09/03/17 0423  NA 142   < > 137  K 3.4*   < > 3.2*  CL 104   < > 99*  CO2 26   < > 26  GLUCOSE 124*   < > 127*  BUN 60*   < > 34*  CREATININE 6.14*   < > 3.72*  CALCIUM 7.8*   < > 8.1*  MG  --   --  1.9  AST 40   --   --   ALT 8*  --   --   ALKPHOS 110  --   --   BILITOT 0.8  --   --    < > = values in this interval not displayed.   Cardiac Enzymes Recent Labs  Lab 09/01/17 0310  TROPONINI 0.28*   RADIOLOGY:  Dg Chest Port 1 View  Result Date: 09/02/2017 CLINICAL DATA:  Respiratory failure EXAM: PORTABLE CHEST 1 VIEW COMPARISON:  Two days ago FINDINGS: Cardiomegaly and thickened hila, likely vascular. There is a left pleural effusion that is small to moderate. No Kerley lines or air bronchogram. Left IJ dialysis catheter with tip near the upper SVC. Right upper extremity PICC in good position. Endotracheal tube tip is between the clavicular heads and carina. An orogastric tube reaches the stomach. IMPRESSION: 1. Stable cardiomegaly with vascular congestion.Stable left pleural effusion. 2. Unremarkable positioning of hardware. Electronically Signed   By: Monte Fantasia M.D.   On: 09/02/2017 07:34   ASSESSMENT AND PLAN:   LASHEA GODA is a 72 y.o. female has a past medical history significant for DM, HTN, and CAD now with 3 day hx of fever, chills, and back apin. Now confused and lethargic. In ER, pt  febrile and tachycardic. Lactic acid elevated c/w with sepsis. UA abnormal and CXR negative  1.  Sepsis secondary to likely UTI/suspected RMSF/ehrlichiosis -Appreciate infectious disease input and they think this is likely Ehrlichiosis - Urine cultures were + for  Klebsiella pneumonia and Proteus - blood culture remain negative - Continue IV Doxy for a total of 10 days.   Finished Treatment of UTI w/ IV Ceftriaxone.  2. acute hypoxic respiratory failure secondary to pulmonary edema in the setting of IV fluid volume overload -Patient failed BiPAP and was intubated but extubated yesterday and now doing well on RA.  -Continue dialysis for fluid removal.  3 Acute Renal failure - likely due to ATN from sepsis.  - pt's urine output remains quite poor. Cr. Trending down after HD though and  Plan for  another HD today. Nephrology following and appreciate input.  -Continue further care as per nephrology.  4. Elevated troponin - demand ischemia in the setting of sepsis -patient denies any chest pain -cardiology consultation placed with dr [name] and no plan for any acute intervention.  - Meds on hold due to sepsis. Hypotension.  -echo shows EF 50-55% with no wall motional abnormalities.   5. Hx of Breast Cancer - cont. Femara  6.  Thrombocytopenia-secondary to underlying sepsis.   -plt count improving and today up to 102.  No acute bleeding.   7. A. Fib w/ RVR - cont. Amio gtt.  - PRN IV metoprolol.   Discussed plan of care with Husband at bedside.   Case discussed with Care Management/Social Worker. Management plans discussed with the patient, family and they are in agreement.  CODE STATUS: full  DVT Prophylaxis: TEd's & SCD's.   TOTAL TIME TAKING CARE OF THIS PATIENT: 30 mins   POSSIBLE D/C unclear, DEPENDING ON CLINICAL CONDITION and course.  Note: This dictation was prepared with Dragon dictation along with smaller phrase technology. Any transcriptional errors that result from this process are unintentional.  [name] M.D on 09/03/2017 at 3:41 PM  Between 7am to 6pm - Pager - [phone]  After 6pm go to [email or web address] - password [email or web address] [facility]  Office  [phone]  CC: Primary care physician; Kirk Ruths, MDPatient ID: [name], female   DOB: 07-29-1945, 73 y.o.   MRN: [id number]

## 2017-09-03 NOTE — Progress Notes (Signed)
Hd completed 

## 2017-09-03 NOTE — Progress Notes (Signed)
This note also relates to the following rows which could not be included: Pulse Rate - Cannot attach notes to unvalidated device data Resp - Cannot attach notes to unvalidated device data BP - Cannot attach notes to unvalidated device data  Hd started  

## 2017-09-03 NOTE — Progress Notes (Addendum)
Good day. More alert and oriented.  Up in chair via overhead lift and back to bed. Constantly complains of back pain. Medicated with Tylenol but no relief in back pain. Discussed back pain with Dr. Celesta Aver. Dr. Celesta Aver refused to medicate patient for pain. In constant motion all day- down in bed,sideways and putting legs out of bed.  Safety sitter busy putting legs back in bed and preventing PICC,Trialysis catheter and NG from being pulled out. Recognizes husband and all visitors today. Urine output increased some today.N/G still present in right nare infusing Nepro at 30 ml/hour. Started on diet after bedside swallow evaluation but not eating well. Worked with PT but very delayed in some responses. Husband in and out. Noted to have a short attention span but able to follow simple directions. Dialysis today staring at 1730.

## 2017-09-03 NOTE — Progress Notes (Addendum)
B/P rechecked when patient was calm. Given Apresoline. B/P cuff on left lower leg and accuracy questioned.

## 2017-09-03 NOTE — Progress Notes (Signed)
Patient much more awake and alert this AM; Followed all commands. Denies pain.Had episodes of agitation in the night that required Precedex.  Extubated 09/02/17  Vitals:   09/03/17 0400 09/03/17 0500 09/03/17 0535 09/03/17 0600  BP: (!) 159/67 (!) 153/60  (!) 149/60  Pulse: 83 73 78 81  Resp: 15 15 (!) 23 (!) 23  Temp: 98.4 F (36.9 C) 98.1 F (36.7 C) 98.1 F (36.7 C) 97.9 F (36.6 C)  TempSrc:      SpO2: 96% 98% 96% 94%  Weight:      Height:       Gen: comfortable in bed  HEENT: NCAT, sclera white, dry oral cavity Neck: JVP not visualized Lungs: breath sounds full anteriorly without wheezes or other adventitious sounds Cardiovascular: RRR, no murmurs Abdomen: Obese, soft, nontender, normal BS Ext: without clubbing, cyanosis, edema Neuro: Awake and appropriate Skin: Limited exam, no lesions noted, groin yeast  The above documented exam has been reviewed entirely and remains accurate    BMP Latest Ref Rng & Units 09/03/2017 09/02/2017 09/01/2017  Glucose 65 - 99 mg/dL 127(H) 159(H) 124(H)  BUN 6 - 20 mg/dL 34(H) 53(H) 60(H)  Creatinine 0.44 - 1.00 mg/dL 3.72(H) 5.25(H) 6.14(H)  Sodium 135 - 145 mmol/L 137 136 142  Potassium 3.5 - 5.1 mmol/L 3.2(L) 3.4(L) 3.4(L)  Chloride 101 - 111 mmol/L 99(L) 102 104  CO2 22 - 32 mmol/L '26 22 26  '$ Calcium 8.9 - 10.3 mg/dL 8.1(L) 7.6(L) 7.8(L)    CBC Latest Ref Rng & Units 09/03/2017 09/02/2017 09/01/2017  WBC 3.6 - 11.0 K/uL 11.1(H) 13.7(H) 13.6(H)  Hemoglobin 12.0 - 16.0 g/dL 8.3(L) 8.5(L) 9.0(L)  Hematocrit 35.0 - 47.0 % 24.3(L) 25.0(L) 26.9(L)  Platelets 150 - 440 K/uL 102(L) 101(L) 108(L)    Hepatic Function Latest Ref Rng & Units 09/03/2017 09/01/2017 08/31/2017  Total Protein 6.5 - 8.1 g/dL - 6.7 6.3(L)  Albumin 3.5 - 5.0 g/dL 2.3(L) 2.9(L) 2.8(L)  AST 15 - 41 U/L - 40 48(H)  ALT 14 - 54 U/L - 8(L) 11(L)  Alk Phosphatase 38 - 126 U/L - 110 112  Total Bilirubin 0.3 - 1.2 mg/dL - 0.8 0.8   Results for orders placed or performed  during the hospital encounter of 08/22/17  Blood Culture (routine x 2)     Status: None   Collection Time: 08/22/17 12:52 PM  Result Value Ref Range Status   Specimen Description BLOOD RIGHT HAND  Final   Special Requests   Final    BOTTLES DRAWN AEROBIC AND ANAEROBIC Blood Culture adequate volume   Culture   Final    NO GROWTH 5 DAYS Performed at [facility], [address], [place], [place] [address]    Report Status 08/27/2017 FINAL  Final  Urine culture     Status: Abnormal   Collection Time: 08/22/17 12:52 PM  Result Value Ref Range Status   Specimen Description   Final    URINE, RANDOM Performed at [facility], [address], [place], [place] [address]    Special Requests   Final    NONE Performed at [facility], [address], [place], [place] [address]    Culture (A)  Final    >=100,000 COLONIES/mL KLEBSIELLA PNEUMONIAE 60,000 COLONIES/mL PROTEUS MIRABILIS    Report Status 08/24/2017 FINAL  Final   Organism ID, Bacteria KLEBSIELLA PNEUMONIAE (A)  Final   Organism ID, Bacteria PROTEUS MIRABILIS (A)  Final      Susceptibility   Klebsiella  pneumoniae - MIC*    AMPICILLIN RESISTANT Resistant     CEFAZOLIN <=4 SENSITIVE Sensitive     CEFTRIAXONE <=1 SENSITIVE Sensitive     CIPROFLOXACIN <=0.25 SENSITIVE Sensitive     GENTAMICIN <=1 SENSITIVE Sensitive     IMIPENEM <=0.25 SENSITIVE Sensitive     NITROFURANTOIN 64 INTERMEDIATE Intermediate     TRIMETH/SULFA <=20 SENSITIVE Sensitive     AMPICILLIN/SULBACTAM 4 SENSITIVE Sensitive     PIP/TAZO <=4 SENSITIVE Sensitive     Extended ESBL NEGATIVE Sensitive     * >=100,000 COLONIES/mL KLEBSIELLA PNEUMONIAE   Proteus mirabilis - MIC*    AMPICILLIN <=2 SENSITIVE Sensitive     CEFAZOLIN <=4 SENSITIVE Sensitive     CEFTRIAXONE <=1 SENSITIVE Sensitive     CIPROFLOXACIN <=0.25 SENSITIVE Sensitive     GENTAMICIN <=1 SENSITIVE Sensitive     IMIPENEM 1 SENSITIVE Sensitive     NITROFURANTOIN 128  RESISTANT Resistant     TRIMETH/SULFA <=20 SENSITIVE Sensitive     AMPICILLIN/SULBACTAM <=2 SENSITIVE Sensitive     PIP/TAZO <=4 SENSITIVE Sensitive     * 60,000 COLONIES/mL PROTEUS MIRABILIS  Blood Culture (routine x 2)     Status: None   Collection Time: 08/22/17 12:53 PM  Result Value Ref Range Status   Specimen Description BLOOD RIGHT ARM  Final   Special Requests   Final    BOTTLES DRAWN AEROBIC AND ANAEROBIC Blood Culture results may not be optimal due to an excessive volume of blood received in culture bottles   Culture   Final    NO GROWTH 5 DAYS Performed at [facility], [address] [place], [place] [address]    Report Status 08/27/2017 FINAL  Final  MRSA PCR Screening     Status: None   Collection Time: 08/22/17  9:18 PM  Result Value Ref Range Status   MRSA by PCR NEGATIVE NEGATIVE Final    Comment:        The GeneXpert MRSA Assay (FDA approved for NASAL specimens only), is one component of a comprehensive MRSA colonization surveillance program. It is not intended to diagnose MRSA infection nor to guide or monitor treatment for MRSA infections. Performed at [facility], [address] [place], [place] [address]   Culture, blood (Routine X 2) w Reflex to ID Panel     Status: None   Collection Time: 08/29/17  9:31 PM  Result Value Ref Range Status   Specimen Description BLOOD LEFT FOOT  Final   Special Requests   Final    BOTTLES DRAWN AEROBIC AND ANAEROBIC Blood Culture adequate volume   Culture   Final    NO GROWTH 5 DAYS Performed at [facility], [address], [place], [place] [address]    Report Status 09/03/2017 FINAL  Final  Culture, blood (Routine X 2) w Reflex to ID Panel     Status: None   Collection Time: 08/29/17  9:50 PM  Result Value Ref Range Status   Specimen Description BLOOD RIGHT ANKLE  Final   Special Requests   Final    BOTTLES DRAWN AEROBIC AND ANAEROBIC Blood Culture adequate volume   Culture    Final    NO GROWTH 5 DAYS Performed at [facility], [address] [place], [place] [address]    Report Status 09/03/2017 FINAL  Final  C difficile quick scan w PCR reflex     Status: None   Collection Time: 09/02/17 12:31 PM  Result Value Ref Range  Status   C Diff antigen NEGATIVE NEGATIVE Final   C Diff toxin NEGATIVE NEGATIVE Final   C Diff interpretation No C. difficile detected.  Final    Comment: Performed at [facility], [address] [place], [place] [address]  CULTURE, BLOOD (ROUTINE X 2) w Reflex to ID Panel     Status: None (Preliminary result)   Collection Time: 09/02/17  7:25 PM  Result Value Ref Range Status   Specimen Description BLOOD BLOOD RIGHT HAND  Final   Special Requests   Final    BOTTLES DRAWN AEROBIC AND ANAEROBIC Blood Culture adequate volume   Culture   Final    NO GROWTH < 12 HOURS Performed at [facility], [address], [place], [place] [address]    Report Status PENDING  Incomplete  CULTURE, BLOOD (ROUTINE X 2) w Reflex to ID Panel     Status: None (Preliminary result)   Collection Time: 09/02/17  7:25 PM  Result Value Ref Range Status   Specimen Description BLOOD BLOOD RIGHT HAND  Final   Special Requests   Final    BOTTLES DRAWN AEROBIC AND ANAEROBIC Blood Culture results may not be optimal due to an inadequate volume of blood received in culture bottles   Culture   Final    NO GROWTH < 12 HOURS Performed at [facility], [address] [place], [place] [address]    Report Status PENDING  Incomplete    Anti-infectives (From admission, onward)   Start     Dose/Rate Route Frequency Ordered Stop   08/25/17 1100  cefTRIAXone (ROCEPHIN) 1 g in sodium chloride 0.9 % 100 mL IVPB  Status:  Discontinued     1 g 200 mL/hr over 30 Minutes Intravenous Every 24 hours 08/25/17 1048 08/31/17 1538   08/25/17 1100  doxycycline (VIBRAMYCIN) 100 mg in sodium chloride 0.9 % 250 mL IVPB     100 mg 125 mL/hr over  120 Minutes Intravenous Every 12 hours 08/25/17 1057     08/24/17 1230  ceFAZolin (ANCEF) IVPB 1 g/50 mL premix  Status:  Discontinued     1 g 100 mL/hr over 30 Minutes Intravenous Every 12 hours 08/24/17 1224 08/25/17 1048   08/22/17 2200  piperacillin-tazobactam (ZOSYN) IVPB 3.375 g  Status:  Discontinued     3.375 g 12.5 mL/hr over 240 Minutes Intravenous Every 8 hours 08/22/17 1509 08/24/17 1221   08/22/17 2200  vancomycin (VANCOCIN) IVPB 1000 mg/200 mL premix  Status:  Discontinued     1,000 mg 200 mL/hr over 60 Minutes Intravenous Every 24 hours 08/22/17 1509 08/23/17 2008   08/22/17 1400  piperacillin-tazobactam (ZOSYN) IVPB 3.375 g     3.375 g 100 mL/hr over 30 Minutes Intravenous  Once 08/22/17 1355 08/22/17 1431   08/22/17 1400  vancomycin (VANCOCIN) IVPB 1000 mg/200 mL premix     1,000 mg 200 mL/hr over 60 Minutes Intravenous  Once 08/22/17 1355 08/22/17 1510      CXR: NNF  MRI brain 06/19: No definite acute intracranial abnormality. 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  IMPRESSION: Severe sepsis of unclear etiology has resolved Recent tick bite - concern for ehrlichiosis (serologies negative) vs RSMF ID F/U Probable LLL PNA - treated Kelbsiella/proteus UTI - fully treated Elevated PCT - has been trending downward Acute encephalopathy much improved Elevated trop I, likely demand ischemia - resolving Pulmonary edema, resolved Chronic LBBB PAF with RVR 6/19 > NSR after amiodarone initiated Acute renal failure, now on  HD (200 ml UO/ 24 hr) last HD 6/20 DM 2, controlled Elevated transaminases, resolved Thrombocytopenia, improving Acute protein-calorie malnutrition Low TSH, low free T4 of unclear etiology. Free T3 pending Diarrhoea- cdiff negative Pyrexia- has resolved H/O breast Ca H/O CAD  PLAN/REC: Will need ischemia eval after recovers from this acute illness Monitor BMET intermittently Monitor I/Os Correct electrolytes as indicated HD per  Nephrology  Monitor temp, WBC count Micro and abx as above Per ID Service - complete 10 days of doxycycline (through 6/21) DVT px: SCDs Monitor CBC intermittently Transfuse per usual guidelines Cont TF protocol Check for C- Diff Repeat cultures Will get PT/OT consult Start Diet   Family: Husband updated at bedside.  Thank you for allowing me the privileges to care for this patient.  Bonnie Libman, MD PCCM service Pager [phone] 09/03/2017 8:38 AM

## 2017-09-03 NOTE — Evaluation (Signed)
Physical Therapy Evaluation Patient Details Name: Bonnie Holt MRN: [id number] DOB: 1946/02/09 Today's Date: 09/03/2017   History of Present Illness  Pt admitted to hospital on 08/22/17 and has been diagnosed with sepsis secondary to UTI, acute renal failure, and acute respiratory failure. Pts hospital stay has been complicated by fall on 7/49/44 intubation on 08/31/17 and extubation on 09/02/17. Pt has a past medical history that includes breast cancer, coronary artery disease, and HTN. Pt currently has temporary IJ catheter for HD, clearance from MD required for mobility.    Clinical Impression  Pt is a pleasant 72 year old female who was admitted for sepsis secondary to UTI, acute renal failure, and acute respiratory failure. Pt performs bed mobility with varying levels assistance however is able to perform bed mobility with min assist when cued. Pt demonstrates deficits with strength, ROM, and mobility. At onset of session nursing gave clearance for PT to work with pt and states that she was total assist via lift to chair next to bed which she tolerated for 3 hours. Pt instructed and performed there-ex for LE strengthening which she tolerated well and states that she enjoyed. Pt demonstrates adequate LE strength to attempt sitting EOB and transfers however due to impaired cognition and impulsivity PT deemed unsafe at this time. Pt could benefit from continued skilled therapy to increase deficits towards PLOF. PT will continue to work with pt at least 2x/week while admitted. D/c recommendation at this time is SNF.     Follow Up Recommendations SNF    Equipment Recommendations  None recommended by PT    Recommendations for Other Services       Precautions / Restrictions Precautions Precautions: None Restrictions Weight Bearing Restrictions: No      Mobility  Bed Mobility Overal bed mobility: Needs Assistance Bed Mobility: Rolling(scooting) Rolling: Min assist         General bed  mobility comments: pt min assist for rolling and scooting in bed. according to nursing pt needs continued readjustment in bed to position self.  Transfers                 General transfer comment: unable to assess at this time due to impaired cognition. Nursing transfered with lift to chair at bedside which she tolerated for 3 hours  Ambulation/Gait             General Gait Details: unable to assess at this time due to impaired cognition  Stairs            Wheelchair Mobility    Modified Rankin (Stroke Patients Only)       Balance Overall balance assessment: (pt remained supine throughtout session)                                           Pertinent Vitals/Pain Pain Assessment: Faces Faces Pain Scale: Hurts even more Pain Location: back Pain Intervention(s): Repositioned;Limited activity within patient's tolerance    Home Living Family/patient expects to be discharged to:: Private residence Living Arrangements: Spouse/significant other Available Help at Discharge: Family Type of Home: House Home Access: Level entry     Home Layout: One level   Additional Comments: unsure of accuracy due to pts current cognitive state    Prior Function Level of Independence: Independent               Hand Dominance  Extremity/Trunk Assessment   Upper Extremity Assessment Upper Extremity Assessment: Overall WFL for tasks assessed(B grossly at least 3/5)    Lower Extremity Assessment Lower Extremity Assessment: RLE deficits/detail;LLE deficits/detail RLE Deficits / Details: Grossly at least 4/5 assessed with activity; isolated hip extension and abd 4/5 LLE Deficits / Details: Grossly at least 4/5 assessed with activity; isolated hip extension and abd 4/5       Communication   Communication: No difficulties  Cognition Arousal/Alertness: Awake/alert Behavior During Therapy: Agitated;Impulsive(pt displays by continually pulling  at lines) Overall Cognitive Status: Impaired/Different from baseline                                        General Comments      Exercises Other Exercises Other Exercises: pt instructed and performed B LE ther-ex including SLR, resisted knee extension, resisted abd, ankle pumps, side lying hip add isometrics and SAQ x10. Pt also instructed in x5 rolling onto R side, pt required verbal cuing throughout ther-ex for safety and technique and safety   Assessment/Plan    PT Assessment Patient needs continued PT services  PT Problem List Decreased strength;Decreased activity tolerance;Decreased range of motion;Decreased balance;Decreased mobility;Decreased coordination;Decreased cognition;Decreased knowledge of use of DME;Decreased safety awareness;Pain       PT Treatment Interventions DME instruction;Gait training;Stair training;Therapeutic activities;Functional mobility training;Therapeutic exercise;Balance training    PT Goals (Current goals can be found in the Care Plan section)  Acute Rehab PT Goals Patient Stated Goal: patient states she wants to go home PT Goal Formulation: With patient Time For Goal Achievement: 09/17/17 Potential to Achieve Goals: Fair    Frequency Min 2X/week   Barriers to discharge        Co-evaluation               AM-PAC PT "6 Clicks" Daily Activity  Outcome Measure Difficulty turning over in bed (including adjusting bedclothes, sheets and blankets)?: Unable Difficulty moving from lying on back to sitting on the side of the bed? : Unable Difficulty sitting down on and standing up from a chair with arms (e.g., wheelchair, bedside commode, etc,.)?: Unable Help needed moving to and from a bed to chair (including a wheelchair)?: Total Help needed walking in hospital room?: Total Help needed climbing 3-5 steps with a railing? : Total 6 Click Score: 6    End of Session Equipment Utilized During Treatment: Gait belt Activity  Tolerance: Patient tolerated treatment well Patient left: in bed;with call bell/phone within reach;with nursing/sitter in room   PT Visit Diagnosis: Unsteadiness on feet (R26.81);Other abnormalities of gait and mobility (R26.89);Muscle weakness (generalized) (M62.81);Ataxic gait (R26.0);Difficulty in walking, not elsewhere classified (R26.2);Pain Pain - part of body: (back)    Time: 1531-1600 PT Time Calculation (min) (ACUTE ONLY): 29 min   Charges:   PT Evaluation $PT Eval Moderate Complexity: 1 Mod PT Treatments $Therapeutic Exercise: 8-22 mins   PT G Codes:        Montez Stryker, SPT   Wilberth Damon 09/03/2017, 5:31 PM

## 2017-09-03 NOTE — Progress Notes (Signed)
[facility], [place] 09/03/17  Subjective:  Patient sitting up in chair this a.m. She is awake and alert. Still remains oliguric at this time however. Due for dialysis again today.  Objective:  Vital signs in last 24 hours:  Temp:  [97.9 F (36.6 C)-100.6 F (38.1 C)] 98.2 F (36.8 C) (06/21 0800) Pulse Rate:  [63-145] 63 (06/21 0800) Resp:  [8-27] 16 (06/21 0800) BP: (100-193)/(44-106) 138/72 (06/21 0800) SpO2:  [82 %-100 %] 99 % (06/21 0800) Weight:  [107.8 kg (237 lb 10.5 oz)-107.9 kg (237 lb 14 oz)] 107.8 kg (237 lb 10.5 oz) (06/21 0317)  Weight change: -1.8 kg (-3 lb 15.5 oz) Filed Weights   09/02/17 0440 09/02/17 1308 09/03/17 0317  Weight: 109.7 kg (241 lb 13.5 oz) 107.9 kg (237 lb 14 oz) 107.8 kg (237 lb 10.5 oz)    Intake/Output:    Intake/Output Summary (Last 24 hours) at 09/03/2017 1104 Last data filed at 09/03/2017 0846 Gross per 24 hour  Intake 2041.49 ml  Output 2445 ml  Net -403.51 ml   Physical Exam: General:  Sitting up in chair  HEENT  anicteric  Neck:  Supple  Lungs:  Scattered rhonchi, normal effort  Heart::  irregular  Abdomen:  Soft, nontender  Extremities:  1+ lower extremity edema  Neurologic:  Awake, alert  Skin:  No acute rashes    Basic Metabolic Panel:  Recent Labs  Lab 08/28/17 0831  08/29/17 2104 08/30/17 0652 08/30/17 1019 08/31/17 0400 09/01/17 0310 09/02/17 0644 09/03/17 0423  NA 128*   < > 133* 139  --  140 142 136 137  K 3.7   < > 4.3 3.8  --  3.3* 3.4* 3.4* 3.2*  CL 100*   < > 101 103  --  104 104 102 99*  CO2 14*   < > 15* 20*  --  22 26 22 26   GLUCOSE 253*   < > 213* 215*  --  225* 124* 159* 127*  BUN 81*   < > 91* 93*  --  72* 60* 53* 34*  CREATININE 9.31*   < > 10.07* 10.20*  --  7.97* 6.14* 5.25* 3.72*  CALCIUM 7.1*   < > 7.7* 7.4*  --  7.2* 7.8* 7.6* 8.1*  MG 2.5*  --  2.7*  --   --   --   --   --   --   PHOS 6.2*  --  7.7*  --  8.2*  --  3.9  --  4.1  4.2   < > = values in this  interval not displayed.     CBC: Recent Labs  Lab 08/29/17 2104 08/31/17 0400 09/01/17 0310 09/02/17 0435 09/03/17 0423  WBC 8.2 10.2 13.6* 13.7* 11.1*  NEUTROABS  --   --   --   --  9.1*  HGB 10.4* 10.0* 9.0* 8.5* 8.3*  HCT 30.3* 29.1* 26.9* 25.0* 24.3*  MCV 92.2 92.0 91.7 91.9 91.7  PLT 78* 86* 108* 101* 102*      Lab Results  Component Value Date   HEPBSAG Negative 08/29/2017   HEPBSAB Reactive 08/29/2017      Microbiology:  Recent Results (from the past 240 hour(s))  Culture, blood (Routine X 2) w Reflex to ID Panel     Status: None   Collection Time: 08/29/17  9:31 PM  Result Value Ref Range Status   Specimen Description BLOOD LEFT FOOT  Final   Special Requests   Final  BOTTLES DRAWN AEROBIC AND ANAEROBIC Blood Culture adequate volume   Culture   Final    NO GROWTH 5 DAYS Performed at [facility], [address] [place], [place] [address]    Report Status 09/03/2017 FINAL  Final  Culture, blood (Routine X 2) w Reflex to ID Panel     Status: None   Collection Time: 08/29/17  9:50 PM  Result Value Ref Range Status   Specimen Description BLOOD RIGHT ANKLE  Final   Special Requests   Final    BOTTLES DRAWN AEROBIC AND ANAEROBIC Blood Culture adequate volume   Culture   Final    NO GROWTH 5 DAYS Performed at [facility] [address] [place], [place] [address]    Report Status 09/03/2017 FINAL  Final  C difficile quick scan w PCR reflex     Status: None   Collection Time: 09/02/17 12:31 PM  Result Value Ref Range Status   C Diff antigen NEGATIVE NEGATIVE Final   C Diff toxin NEGATIVE NEGATIVE Final   C Diff interpretation No C. difficile detected.  Final    Comment: Performed at [facility], [address] [place], [place] [address]  CULTURE, BLOOD (ROUTINE X 2) w Reflex to ID Panel     Status: None (Preliminary result)   Collection Time: 09/02/17  7:25 PM  Result Value Ref Range Status   Specimen Description  BLOOD BLOOD RIGHT HAND  Final   Special Requests   Final    BOTTLES DRAWN AEROBIC AND ANAEROBIC Blood Culture adequate volume   Culture   Final    NO GROWTH < 12 HOURS Performed at [facility], [address], [place], [place] [address]    Report Status PENDING  Incomplete  CULTURE, BLOOD (ROUTINE X 2) w Reflex to ID Panel     Status: None (Preliminary result)   Collection Time: 09/02/17  7:25 PM  Result Value Ref Range Status   Specimen Description BLOOD BLOOD RIGHT HAND  Final   Special Requests   Final    BOTTLES DRAWN AEROBIC AND ANAEROBIC Blood Culture results may not be optimal due to an inadequate volume of blood received in culture bottles   Culture   Final    NO GROWTH < 12 HOURS Performed at [facility], [address] [place], [place] [address]    Report Status PENDING  Incomplete    Coagulation Studies: No results for input(s): LABPROT, INR in the last 72 hours.  Urinalysis: No results for input(s): COLORURINE, LABSPEC, PHURINE, GLUCOSEU, HGBUR, BILIRUBINUR, KETONESUR, PROTEINUR, UROBILINOGEN, NITRITE, LEUKOCYTESUR in the last 72 hours.  Invalid input(s): APPERANCEUR    Imaging: Mr Brain Wo Contrast  Result Date: 09/01/2017 CLINICAL DATA:  Initial evaluation for acute altered mental status, fever, tech bite. EXAM: MRI HEAD WITHOUT CONTRAST TECHNIQUE: Multiplanar, multiecho pulse sequences of the brain and surrounding structures were obtained without intravenous contrast. COMPARISON:  Prior CT from 08/14/2017. FINDINGS: Brain: Examination moderately degraded by motion artifact. No evidence for acute infarct. Gray-white matter differentiation maintained. Small remote lacunar infarct noted within the left caudate. No other discernible areas of chronic infarction. No acute or chronic intracranial hemorrhage. No appreciable mass lesion on this motion degraded exam. No mass effect or midline shift. No hydrocephalus. No extra-axial collection. Normal  pituitary gland. Vascular: Major intracranial vascular flow voids maintained at the skull base. Skull and upper cervical spine: Craniocervical junction within normal limits. Upper cervical spine normal. No marrow replacing lesion. Scalp  soft tissues unremarkable. Sinuses/Orbits: Globes and orbital soft tissues grossly within normal limits. Scattered mucosal thickening within the ethmoidal air cells and sphenoid sinuses. Fluid seen within the nasopharynx. Nasogastric tube in place. Bilateral mastoid effusions noted. Other: None. IMPRESSION: 1. Limited study due to extensive motion artifact. No definite acute intracranial abnormality. 2. 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 Electronically Signed   By: Jeannine Boga M.D.   On: 09/01/2017 15:37   Dg Chest Port 1 View  Result Date: 09/02/2017 CLINICAL DATA:  Respiratory failure EXAM: PORTABLE CHEST 1 VIEW COMPARISON:  Two days ago FINDINGS: Cardiomegaly and thickened hila, likely vascular. There is a left pleural effusion that is small to moderate. No Kerley lines or air bronchogram. Left IJ dialysis catheter with tip near the upper SVC. Right upper extremity PICC in good position. Endotracheal tube tip is between the clavicular heads and carina. An orogastric tube reaches the stomach. IMPRESSION: 1. Stable cardiomegaly with vascular congestion.Stable left pleural effusion. 2. Unremarkable positioning of hardware. Electronically Signed   By: Monte Fantasia M.D.   On: 09/02/2017 07:34     Medications:   . amiodarone 30 mg/hr (09/03/17 0800)  . doxycycline (VIBRAMYCIN) IV Stopped (09/02/17 2353)   . B-complex with vitamin C  1 tablet Per Tube QHS  . chlorhexidine  15 mL Mouth Rinse BID  . Chlorhexidine Gluconate Cloth  6 each Topical Q0600  . feeding supplement (NEPRO CARB STEADY)  1,000 mL Per Tube Q24H  . feeding supplement (PRO-STAT SUGAR FREE 64)  30 mL Per Tube Daily  . free water  200 mL Per Tube Q8H  . insulin aspart  0-15  Units Subcutaneous Q4H  . insulin aspart  3 Units Subcutaneous Q4H  . insulin glargine  10 Units Subcutaneous Daily  . mouth rinse  15 mL Mouth Rinse 10 times per day  . pantoprazole sodium  40 mg Per Tube Daily  . sodium chloride flush  10-40 mL Intracatheter Q12H   acetaminophen **OR** acetaminophen, bisacodyl, fentaNYL (SUBLIMAZE) injection, heparin lock flush, hydrALAZINE, ipratropium-albuterol, metoprolol tartrate, [DISCONTINUED] ondansetron **OR** ondansetron (ZOFRAN) IV, sodium chloride flush  Assessment/ Plan:  72 y.o. female with diabetes, coronary disease, sleep apnea, history of breast cancer, was admitted on 08/22/2017 with fever, chills, tick bite.   1.  Acute renal failure due to hypotension, sepsis. Remains oliguric 2.  Sepsis 3.  Urinary tract infection-Klebsiella, Proteus 4.  Elevated troponin/demand ischemia 5.  Hypotension, on pressors 6.  Thrombocytopenia 7.  Hyponatremia, Hypokalemia 8.  Lower extremity Edema 9.  Acute respiratory failure, placed on ventilator 08/31/17, extubated 09/02/17.  -Patient remains oliguric at this point in time.  She had rather prolonged acute renal failure.  We will plan for another dialysis session today.  In addition we will check ANA, SPEP, UPEP, ANCA antibodies, GBM antibodies, C3, C4 for additional work-up.  Hopefully her renal function will begin to improve over the weekend.  Otherwise continue supportive care at this time.     LOS: 12 Anthonette Legato 6/21/201911:04 AM  [place], [place]  Note: This note was prepared with Dragon dictation. Any transcription errors are unintentional

## 2017-09-03 NOTE — Progress Notes (Signed)
   09/03/17 0940  Clinical Encounter Type  Visited With Family  Visit Type Follow-up  Consult/Referral To Chaplain  Spiritual Encounters  Spiritual Needs Emotional;Other (Comment) (Company and Sales executive)   When attempting to find family for another patient, [place] ran into pt's husband, [name] in the ICU waiting room.  [name] was talking with another visitor to the ICU.  [place] checked in with [name] about pt's condition and [name] was relieved and joyful about positive changes in patient's health. [name] attributed changes to a miracle and the wonderful support he had received from hospital staff and chaplains.  [name] described the nurses as "angels." Marvin's demeanor was much different from our last encounter.  He was talking and laughing and engaging with others in the ICU waiting room.  Before leaving he thanked me for listening and visiting with him.

## 2017-09-04 DIAGNOSIS — E440 Moderate protein-calorie malnutrition: Secondary | ICD-10-CM

## 2017-09-04 DIAGNOSIS — E876 Hypokalemia: Secondary | ICD-10-CM

## 2017-09-04 LAB — GLUCOSE, CAPILLARY
GLUCOSE-CAPILLARY: 111 mg/dL — AB (ref 65–99)
Glucose-Capillary: 131 mg/dL — ABNORMAL HIGH (ref 65–99)
Glucose-Capillary: 155 mg/dL — ABNORMAL HIGH (ref 65–99)
Glucose-Capillary: 159 mg/dL — ABNORMAL HIGH (ref 65–99)
Glucose-Capillary: 126 mg/dL — ABNORMAL HIGH (ref 65–99)

## 2017-09-04 LAB — RENAL FUNCTION PANEL
Albumin: 2.4 g/dL — ABNORMAL LOW (ref 3.5–5.0)
Anion gap: 11 (ref 5–15)
BUN: 22 mg/dL — AB (ref 6–20)
CALCIUM: 8.1 mg/dL — AB (ref 8.9–10.3)
CHLORIDE: 101 mmol/L (ref 101–111)
CO2: 26 mmol/L (ref 22–32)
CREATININE: 2.74 mg/dL — AB (ref 0.44–1.00)
GFR calc Af Amer: 19 mL/min — ABNORMAL LOW (ref >60)
GFR calc non Af Amer: 16 mL/min — ABNORMAL LOW (ref >60)
Glucose, Bld: 137 mg/dL — ABNORMAL HIGH (ref 65–99)
Phosphorus: 3.7 mg/dL (ref 2.5–4.6)
Potassium: 2.6 mmol/L — CL (ref 3.5–5.1)
SODIUM: 138 mmol/L (ref 135–145)

## 2017-09-04 LAB — MPO/PR-3 (ANCA) ANTIBODIES

## 2017-09-04 LAB — CBC
HEMATOCRIT: 26.3 % — AB (ref 35.0–47.0)
HEMOGLOBIN: 9 g/dL — AB (ref 12.0–16.0)
MCH: 31.5 pg (ref 26.0–34.0)
MCHC: 34.4 g/dL (ref 32.0–36.0)
MCV: 91.8 fL (ref 80.0–100.0)
Platelets: 120 10*3/uL — ABNORMAL LOW (ref 150–440)
RBC: 2.86 MIL/uL — ABNORMAL LOW (ref 3.80–5.20)
RDW: 16.1 % — ABNORMAL HIGH (ref 11.5–14.5)
WBC: 11.3 10*3/uL — ABNORMAL HIGH (ref 3.6–11.0)

## 2017-09-04 LAB — C4 COMPLEMENT: Complement C4, Body Fluid: 16 mg/dL (ref 14–44)

## 2017-09-04 LAB — URINE CULTURE: Culture: >=100000 — AB

## 2017-09-04 LAB — C3 COMPLEMENT: C3 COMPLEMENT: 127 mg/dL (ref 82–167)

## 2017-09-04 LAB — POTASSIUM: Potassium: 3.6 mmol/L (ref 3.5–5.1)

## 2017-09-04 LAB — ANA W/REFLEX IF POSITIVE: ANA: NEGATIVE

## 2017-09-04 MED ORDER — ORAL CARE MOUTH RINSE
15.0000 mL | Freq: Two times a day (BID) | OROMUCOSAL | Status: DC
  Administered 2017-09-04 – 2017-09-15 (×13): 15 mL via OROMUCOSAL

## 2017-09-04 MED ORDER — POTASSIUM CHLORIDE 10 MEQ/50ML IV SOLN
10.0000 meq | INTRAVENOUS | Status: AC
  Administered 2017-09-04 (×3): 10 meq via INTRAVENOUS
  Filled 2017-09-04 (×3): qty 50

## 2017-09-04 MED ORDER — POTASSIUM CHLORIDE 20 MEQ PO PACK
60.0000 meq | PACK | Freq: Once | ORAL | Status: AC
  Administered 2017-09-04: 60 meq via NASOGASTRIC
  Filled 2017-09-04: qty 3

## 2017-09-04 NOTE — Progress Notes (Signed)
[facility], [place] 09/04/17  Subjective:  Patient still in the critical care unit. Oliguria persists. She did undergo hemodialysis yesterday. Ultrafiltration achieved yesterday was 1.5 L.  Objective:  Vital signs in last 24 hours:  Temp:  [97.6 F (36.4 C)-98.6 F (37 C)] 97.7 F (36.5 C) (06/22 0800) Pulse Rate:  [62-96] 92 (06/22 1000) Resp:  [14-26] 17 (06/22 1000) BP: (114-218)/(50-183) 169/73 (06/22 1000) SpO2:  [95 %-100 %] 95 % (06/22 1000) Weight:  [109.3 kg (240 lb 15.4 oz)] 109.3 kg (240 lb 15.4 oz) (06/22 0505)  Weight change: 1.4 kg (3 lb 1.4 oz) Filed Weights   09/02/17 1308 09/03/17 0317 09/04/17 0505  Weight: 107.9 kg (237 lb 14 oz) 107.8 kg (237 lb 10.5 oz) 109.3 kg (240 lb 15.4 oz)    Intake/Output:    Intake/Output Summary (Last 24 hours) at 09/04/2017 1011 Last data filed at 09/04/2017 1000 Gross per 24 hour  Intake 3142.6 ml  Output 1885 ml  Net 1257.6 ml   Physical Exam: General:  Laying in bed  HEENT  anicteric OM moist  Neck:  Supple  Lungs:  Scattered rhonchi, normal effort  Heart::  irregular, no rubs  Abdomen:  Soft, nontender, BS present  Extremities:  1+ lower extremity edema  Neurologic:  Awake, alert, follows commands  Skin:  facial erythema noted    Basic Metabolic Panel:  Recent Labs  Lab 08/29/17 2104  08/30/17 1019 08/31/17 0400 09/01/17 0310 09/02/17 0644 09/03/17 0423 09/04/17 0527  NA 133*   < >  --  140 142 136 137 138  K 4.3   < >  --  3.3* 3.4* 3.4* 3.2* 2.6*  CL 101   < >  --  104 104 102 99* 101  CO2 15*   < >  --  22 26 22 26 26   GLUCOSE 213*   < >  --  225* 124* 159* 127* 137*  BUN 91*   < >  --  72* 60* 53* 34* 22*  CREATININE 10.07*   < >  --  7.97* 6.14* 5.25* 3.72* 2.74*  CALCIUM 7.7*   < >  --  7.2* 7.8* 7.6* 8.1* 8.1*  MG 2.7*  --   --   --   --   --  1.9  --   PHOS 7.7*  --  8.2*  --  3.9  --  4.1  4.2 3.7   < > = values in this interval not displayed.      CBC: Recent Labs  Lab 08/31/17 0400 09/01/17 0310 09/02/17 0435 09/03/17 0423 09/04/17 0527  WBC 10.2 13.6* 13.7* 11.1* 11.3*  NEUTROABS  --   --   --  9.1*  --   HGB 10.0* 9.0* 8.5* 8.3* 9.0*  HCT 29.1* 26.9* 25.0* 24.3* 26.3*  MCV 92.0 91.7 91.9 91.7 91.8  PLT 86* 108* 101* 102* 120*      Lab Results  Component Value Date   HEPBSAG Negative 08/29/2017   HEPBSAB Reactive 08/29/2017      Microbiology:  Recent Results (from the past 240 hour(s))  Culture, blood (Routine X 2) w Reflex to ID Panel     Status: None   Collection Time: 08/29/17  9:31 PM  Result Value Ref Range Status   Specimen Description BLOOD LEFT FOOT  Final   Special Requests   Final    BOTTLES DRAWN AEROBIC AND ANAEROBIC Blood Culture adequate volume   Culture   Final  NO GROWTH 5 DAYS Performed at [facility], [address] [place], [place] [address]    Report Status 09/03/2017 FINAL  Final  Culture, blood (Routine X 2) w Reflex to ID Panel     Status: None   Collection Time: 08/29/17  9:50 PM  Result Value Ref Range Status   Specimen Description BLOOD RIGHT ANKLE  Final   Special Requests   Final    BOTTLES DRAWN AEROBIC AND ANAEROBIC Blood Culture adequate volume   Culture   Final    NO GROWTH 5 DAYS Performed at [facility], [address] [place], [place] [address]    Report Status 09/03/2017 FINAL  Final  C difficile quick scan w PCR reflex     Status: None   Collection Time: 09/02/17 12:31 PM  Result Value Ref Range Status   C Diff antigen NEGATIVE NEGATIVE Final   C Diff toxin NEGATIVE NEGATIVE Final   C Diff interpretation No C. difficile detected.  Final    Comment: Performed at [facility], [address] [place], [place] [address]  CULTURE, BLOOD (ROUTINE X 2) w Reflex to ID Panel     Status: None (Preliminary result)   Collection Time: 09/02/17  7:25 PM  Result Value Ref Range Status   Specimen Description BLOOD BLOOD RIGHT HAND   Final   Special Requests   Final    BOTTLES DRAWN AEROBIC AND ANAEROBIC Blood Culture adequate volume   Culture   Final    NO GROWTH 2 DAYS Performed at [facility], [address], [place], [place] [address]    Report Status PENDING  Incomplete  CULTURE, BLOOD (ROUTINE X 2) w Reflex to ID Panel     Status: None (Preliminary result)   Collection Time: 09/02/17  7:25 PM  Result Value Ref Range Status   Specimen Description BLOOD BLOOD RIGHT HAND  Final   Special Requests   Final    BOTTLES DRAWN AEROBIC AND ANAEROBIC Blood Culture results may not be optimal due to an inadequate volume of blood received in culture bottles   Culture   Final    NO GROWTH 2 DAYS Performed at [facility], [address] [place], [place] [address]    Report Status PENDING  Incomplete    Coagulation Studies: No results for input(s): LABPROT, INR in the last 72 hours.  Urinalysis: No results for input(s): COLORURINE, LABSPEC, PHURINE, GLUCOSEU, HGBUR, BILIRUBINUR, KETONESUR, PROTEINUR, UROBILINOGEN, NITRITE, LEUKOCYTESUR in the last 72 hours.  Invalid input(s): APPERANCEUR    Imaging: No results found.   Medications:   . amiodarone 30 mg/hr (09/04/17 1000)   . B-complex with vitamin C  1 tablet Per Tube QHS  . chlorhexidine  15 mL Mouth Rinse BID  . Chlorhexidine Gluconate Cloth  6 each Topical Q0600  . feeding supplement (NEPRO CARB STEADY)  1,000 mL Per Tube Q24H  . feeding supplement (PRO-STAT SUGAR FREE 64)  30 mL Per Tube Daily  . free water  200 mL Per Tube Q8H  . heparin injection (subcutaneous)  5,000 Units Subcutaneous Q8H  . insulin aspart  0-15 Units Subcutaneous Q4H  . insulin aspart  3 Units Subcutaneous Q4H  . insulin glargine  10 Units Subcutaneous Daily  . letrozole  2.5 mg Oral Daily  . mouth rinse  15 mL Mouth Rinse q12n4p  . nystatin cream   Topical BID  . pantoprazole sodium  40 mg Per Tube Daily  . sodium chloride flush  10-40 mL Intracatheter  Q12H  . tuberculin  5 Units Intradermal Once   acetaminophen **OR** acetaminophen, bisacodyl, fentaNYL (SUBLIMAZE) injection, heparin lock flush, hydrALAZINE, ipratropium-albuterol, metoprolol tartrate, [DISCONTINUED] ondansetron **OR** ondansetron (ZOFRAN) IV, sodium chloride flush  Assessment/ Plan:  71 y.o. female with diabetes, coronary disease, sleep apnea, history of breast cancer, was admitted on 08/22/2017 with fever, chills, tick bite.   1.  Acute renal failure due to hypotension, sepsis. Remains oliguric 2.  Sepsis 3.  Urinary tract infection-Klebsiella, Proteus 4.  Elevated troponin/demand ischemia 5.  Hypotension, on pressors 6.  Thrombocytopenia 7.  Hyponatremia, Hypokalemia 8.  Lower extremity Edema 9.  Acute respiratory failure, placed on ventilator 08/31/17, extubated 09/02/17.  -Patient status has improved with aggressive dialysis over the past week.  Hemodialysis was performed yesterday and ultrafiltration achieved was 1.5 kg.  Patient now off of the ventilator.  She remains oliguric at this time.  We will hold off on dialysis today but we will reassess the patient for dialysis need tomorrow as well.  Otherwise for now continue supportive care and monitor serum electrolytes and avoid nephrotoxins as possible.  Potassium noted to be low and patient was provided enteral repletion.  Recommend rechecking serum potassium later today.     LOS: [place] 6/22/201910:11 AM  [place], [place]  Note: This note was prepared with Dragon dictation. Any transcription errors are unintentional

## 2017-09-04 NOTE — Progress Notes (Signed)
CRITICAL VALUE ALERT  Critical Value: K+ 2.6  Date & Time Notied: 09/04/17 @ 0600  Provider Notified: Ms. Patria Mane, NP  Orders Received/Actions taken: NP will order K+ replacement

## 2017-09-04 NOTE — Progress Notes (Signed)
Patient ID: Bonnie Holt, female   DOB: Apr 26, 1945, 72 y.o.   MRN: [id number]  [employer] [facility] PROGRESS NOTE  Bonnie Holt FWY:[id number] DOB: 05-15-1945 DOA: 08/22/2017 PCP: Kirk Ruths, MD  HPI/Subjective: Patient answers a few questions.  Complains of  low back pain.  Objective: Vitals:   09/04/17 1200 09/04/17 1300  BP: (!) 149/58 (!) 149/63  Pulse: 96 97  Resp: (!) 27 20  Temp: 97.6 F (36.4 C)   SpO2: 92% 94%    Intake/Output Summary (Last 24 hours) at 09/04/2017 1311 Last data filed at 09/04/2017 1300 Gross per 24 hour  Intake 2832.7 ml  Output 1890 ml  Net 942.7 ml   Filed Weights   09/02/17 1308 09/03/17 0317 09/04/17 0505  Weight: 107.9 kg (237 lb 14 oz) 107.8 kg (237 lb 10.5 oz) 109.3 kg (240 lb 15.4 oz)    ROS: Review of Systems  Unable to perform ROS: Acuity of condition  Respiratory: Positive for cough. Negative for shortness of breath.   Cardiovascular: Negative for chest pain.  Gastrointestinal: Positive for diarrhea. Negative for abdominal pain.  Musculoskeletal: Positive for back pain.   Exam: Physical Exam  HENT:  Nose: No mucosal edema.  Mouth/Throat: No oropharyngeal exudate or posterior oropharyngeal edema.  Eyes: Pupils are equal, round, and reactive to light. Conjunctivae, EOM and lids are normal.  Neck: No JVD present. Carotid bruit is not present. No edema present. No thyroid mass and no thyromegaly present.  Cardiovascular: S1 normal and S2 normal. Exam reveals no gallop.  No murmur heard. Pulses:      Dorsalis pedis pulses are 2+ on the right side, and 2+ on the left side.  Respiratory: No respiratory distress. She has decreased breath sounds in the right lower field and the left lower field. She has no wheezes. She has no rhonchi. She has no rales.  GI: Soft. Bowel sounds are normal. There is no tenderness.  Musculoskeletal:       Right ankle: She exhibits swelling.       Left ankle: She exhibits swelling.  Lymphadenopathy:     She has no cervical adenopathy.  Neurological: She is alert. No cranial nerve deficit.  Skin: Skin is warm. No rash noted. Nails show no clubbing.  Psychiatric: Her affect is blunt.      Data Reviewed: Basic Metabolic Panel: Recent Labs  Lab 08/29/17 2104  08/30/17 1019 08/31/17 0400 09/01/17 0310 09/02/17 0644 09/03/17 0423 09/04/17 0527  NA 133*   < >  --  140 142 136 137 138  K 4.3   < >  --  3.3* 3.4* 3.4* 3.2* 2.6*  CL 101   < >  --  104 104 102 99* 101  CO2 15*   < >  --  22 26 22 26 26   GLUCOSE 213*   < >  --  225* 124* 159* 127* 137*  BUN 91*   < >  --  72* 60* 53* 34* 22*  CREATININE 10.07*   < >  --  7.97* 6.14* 5.25* 3.72* 2.74*  CALCIUM 7.7*   < >  --  7.2* 7.8* 7.6* 8.1* 8.1*  MG 2.7*  --   --   --   --   --  1.9  --   PHOS 7.7*  --  8.2*  --  3.9  --  4.1  4.2 3.7   < > = values in this interval not displayed.   Liver Function Tests: Recent  Labs  Lab 08/29/17 2104 08/31/17 0400 09/01/17 0310 09/03/17 0423 09/04/17 0527  AST 69* 48* 40  --   --   ALT 12* 11* 8*  --   --   ALKPHOS 134* 112 110  --   --   BILITOT 0.6 0.8 0.8  --   --   PROT 6.0* 6.3* 6.7  --   --   ALBUMIN 2.7* 2.8* 2.9* 2.3* 2.4*   CBC: Recent Labs  Lab 08/31/17 0400 09/01/17 0310 09/02/17 0435 09/03/17 0423 09/04/17 0527  WBC 10.2 13.6* 13.7* 11.1* 11.3*  NEUTROABS  --   --   --  9.1*  --   HGB 10.0* 9.0* 8.5* 8.3* 9.0*  HCT 29.1* 26.9* 25.0* 24.3* 26.3*  MCV 92.0 91.7 91.9 91.7 91.8  PLT 86* 108* 101* 102* 120*   Cardiac Enzymes: Recent Labs  Lab 08/29/17 2104 08/30/17 0652 09/01/17 0310  CKTOTAL 98  --   --   TROPONINI 0.11* 0.12* 0.28*    CBG: Recent Labs  Lab 09/03/17 2125 09/03/17 2332 09/04/17 0348 09/04/17 0813 09/04/17 1203  GLUCAP 118* 148* 131* 155* 159*    Recent Results (from the past 240 hour(s))  Culture, blood (Routine X 2) w Reflex to ID Panel     Status: None   Collection Time: 08/29/17  9:31 PM  Result Value Ref Range Status    Specimen Description BLOOD LEFT FOOT  Final   Special Requests   Final    BOTTLES DRAWN AEROBIC AND ANAEROBIC Blood Culture adequate volume   Culture   Final    NO GROWTH 5 DAYS Performed at [facility], [address] [place], [place] [address]    Report Status 09/03/2017 FINAL  Final  Culture, blood (Routine X 2) w Reflex to ID Panel     Status: None   Collection Time: 08/29/17  9:50 PM  Result Value Ref Range Status   Specimen Description BLOOD RIGHT ANKLE  Final   Special Requests   Final    BOTTLES DRAWN AEROBIC AND ANAEROBIC Blood Culture adequate volume   Culture   Final    NO GROWTH 5 DAYS Performed at [facility], [address], [place], [place] [address]    Report Status 09/03/2017 FINAL  Final  C difficile quick scan w PCR reflex     Status: None   Collection Time: 09/02/17 12:31 PM  Result Value Ref Range Status   C Diff antigen NEGATIVE NEGATIVE Final   C Diff toxin NEGATIVE NEGATIVE Final   C Diff interpretation No C. difficile detected.  Final    Comment: Performed at [facility], [address] [place], [place] [address]  CULTURE, BLOOD (ROUTINE X 2) w Reflex to ID Panel     Status: None (Preliminary result)   Collection Time: 09/02/17  7:25 PM  Result Value Ref Range Status   Specimen Description BLOOD BLOOD RIGHT HAND  Final   Special Requests   Final    BOTTLES DRAWN AEROBIC AND ANAEROBIC Blood Culture adequate volume   Culture   Final    NO GROWTH 2 DAYS Performed at [facility], [address], [place], [place] [address]    Report Status PENDING  Incomplete  CULTURE, BLOOD (ROUTINE X 2) w Reflex to ID Panel     Status: None (Preliminary result)   Collection Time: 09/02/17  7:25 PM  Result Value Ref Range Status   Specimen Description BLOOD BLOOD RIGHT HAND  Final  Special Requests   Final    BOTTLES DRAWN AEROBIC AND ANAEROBIC Blood Culture results may not be optimal due to an inadequate volume of blood  received in culture bottles   Culture   Final    NO GROWTH 2 DAYS Performed at [facility], [address] [place], [place] [address]    Report Status PENDING  Incomplete  Urine Culture     Status: None (Preliminary result)   Collection Time: 09/03/17  4:23 AM  Result Value Ref Range Status   Specimen Description   Final    URINE, RANDOM Performed at [facility], [address], [place], [place] [address]    Special Requests   Final    NONE Performed at [facility], [address], [place], [place] [address]    Culture   Final    CULTURE REINCUBATED FOR BETTER GROWTH Performed at [facility], [place] [address] [place], [place] [address]    Report Status PENDING  Incomplete      Scheduled Meds: . B-complex with vitamin C  1 tablet Per Tube QHS  . chlorhexidine  15 mL Mouth Rinse BID  . Chlorhexidine Gluconate Cloth  6 each Topical Q0600  . feeding supplement (NEPRO CARB STEADY)  1,000 mL Per Tube Q24H  . feeding supplement (PRO-STAT SUGAR FREE 64)  30 mL Per Tube Daily  . free water  200 mL Per Tube Q8H  . heparin injection (subcutaneous)  5,000 Units Subcutaneous Q8H  . insulin aspart  0-15 Units Subcutaneous Q4H  . insulin aspart  3 Units Subcutaneous Q4H  . insulin glargine  10 Units Subcutaneous Daily  . letrozole  2.5 mg Oral Daily  . mouth rinse  15 mL Mouth Rinse q12n4p  . nystatin cream   Topical BID  . pantoprazole sodium  40 mg Per Tube Daily  . sodium chloride flush  10-40 mL Intracatheter Q12H  . tuberculin  5 Units Intradermal Once   Continuous Infusions: . amiodarone 30 mg/hr (09/04/17 1300)    Assessment/Plan:  1. Sepsis with multiorgan failure and initial hypotension.  Likely combination of urinary tract infection versus tickborne disease.  Finished antibiotics. 2. Acute hypoxic respiratory failure with fluid overload.  Patient extubated on Thursday.  On nasal cannula currently. 3. Acute kidney injury.  ATN from  sepsis and hypotension.  Hemodialysis as per nephrology.  Creatinine today better than it was the last couple days.  Continue to monitor daily. 4. Elevated troponin demand ischemia in the setting of sepsis. 5. Atrial fibrillation with rapid ventricular response.  On amiodarone drip.  Currently in normal sinus rhythm. 6. Thrombocytopenia secondary to sepsis 7. History of breast cancer on Femara 8. Morbid obesity weight loss needed 9. History of sleep apnea on BiPAP at home  10. type 2 diabetes on sliding scale 11. Nutrition.  Patient on diet and also receiving tube feeds. 12. Recommend physical therapy evaluation  Code Status:     Code Status Orders  (From admission, onward)        Start     Ordered   08/22/17 1612  Full code  Continuous     08/22/17 1611    Code Status History    This patient has a current code status but no historical code status.     Family Communication: Husband and sister at the bedside Disposition Plan: To be determined based on clinical course  Consultants:  Critical care specialist  Nephrology  Time spent: 28 minutes  Davy Faught [employer]

## 2017-09-04 NOTE — Progress Notes (Signed)
Patient followed all commands. Denies pain.  Extubated 09/02/17  Vitals:   09/04/17 1500 09/04/17 1600 09/04/17 1700 09/04/17 1800  BP: (!) 154/72 (!) 169/65 (!) 171/74 (!) 160/63  Pulse: 98 94 98 98  Resp: (!) 21 18 (!) 27 (!) 23  Temp:  97.8 F (36.6 C)    TempSrc:  Axillary    SpO2: 91% 95% 97% 95%  Weight:      Height:       Gen: comfortable in bed  HEENT: NCAT, sclera white, dry oral cavity Neck: JVP not visualized Lungs: breath sounds full anteriorly without wheezes or other adventitious sounds Cardiovascular: RRR, no murmurs Abdomen: Obese, soft, nontender, normal BS Ext: without clubbing, cyanosis, edema Neuro: Awake and appropriate Skin: Limited exam, no lesions noted, groin yeast  The above documented exam has been reviewed entirely and remains accurate    BMP Latest Ref Rng & Units 09/04/2017 09/04/2017 09/03/2017  Glucose 65 - 99 mg/dL - 137(H) 127(H)  BUN 6 - 20 mg/dL - 22(H) 34(H)  Creatinine 0.44 - 1.00 mg/dL - 2.74(H) 3.72(H)  Sodium 135 - 145 mmol/L - 138 137  Potassium 3.5 - 5.1 mmol/L 3.6 2.6(LL) 3.2(L)  Chloride 101 - 111 mmol/L - 101 99(L)  CO2 22 - 32 mmol/L - 26 26  Calcium 8.9 - 10.3 mg/dL - 8.1(L) 8.1(L)    CBC Latest Ref Rng & Units 09/04/2017 09/03/2017 09/02/2017  WBC 3.6 - 11.0 K/uL 11.3(H) 11.1(H) 13.7(H)  Hemoglobin 12.0 - 16.0 g/dL 9.0(L) 8.3(L) 8.5(L)  Hematocrit 35.0 - 47.0 % 26.3(L) 24.3(L) 25.0(L)  Platelets 150 - 440 K/uL 120(L) 102(L) 101(L)    Hepatic Function Latest Ref Rng & Units 09/04/2017 09/03/2017 09/01/2017  Total Protein 6.5 - 8.1 g/dL - - 6.7  Albumin 3.5 - 5.0 g/dL 2.4(L) 2.3(L) 2.9(L)  AST 15 - 41 U/L - - 40  ALT 14 - 54 U/L - - 8(L)  Alk Phosphatase 38 - 126 U/L - - 110  Total Bilirubin 0.3 - 1.2 mg/dL - - 0.8   Results for orders placed or performed during the hospital encounter of 08/22/17  Blood Culture (routine x 2)     Status: None   Collection Time: 08/22/17 12:52 PM  Result Value Ref Range Status   Specimen  Description BLOOD RIGHT HAND  Final   Special Requests   Final    BOTTLES DRAWN AEROBIC AND ANAEROBIC Blood Culture adequate volume   Culture   Final    NO GROWTH 5 DAYS Performed at [facility], [address], [place], [place] [address]    Report Status 08/27/2017 FINAL  Final  Urine culture     Status: Abnormal   Collection Time: 08/22/17 12:52 PM  Result Value Ref Range Status   Specimen Description   Final    URINE, RANDOM Performed at [facility], [address], [place], [place] [address]    Special Requests   Final    NONE Performed at [facility], [address], [place], [place] [address]    Culture (A)  Final    >=100,000 COLONIES/mL KLEBSIELLA PNEUMONIAE 60,000 COLONIES/mL PROTEUS MIRABILIS    Report Status 08/24/2017 FINAL  Final   Organism ID, Bacteria KLEBSIELLA PNEUMONIAE (A)  Final   Organism ID, Bacteria PROTEUS MIRABILIS (A)  Final      Susceptibility   Klebsiella pneumoniae - MIC*    AMPICILLIN RESISTANT Resistant     CEFAZOLIN <=4 SENSITIVE Sensitive     CEFTRIAXONE <=1  SENSITIVE Sensitive     CIPROFLOXACIN <=0.25 SENSITIVE Sensitive     GENTAMICIN <=1 SENSITIVE Sensitive     IMIPENEM <=0.25 SENSITIVE Sensitive     NITROFURANTOIN 64 INTERMEDIATE Intermediate     TRIMETH/SULFA <=20 SENSITIVE Sensitive     AMPICILLIN/SULBACTAM 4 SENSITIVE Sensitive     PIP/TAZO <=4 SENSITIVE Sensitive     Extended ESBL NEGATIVE Sensitive     * >=100,000 COLONIES/mL KLEBSIELLA PNEUMONIAE   Proteus mirabilis - MIC*    AMPICILLIN <=2 SENSITIVE Sensitive     CEFAZOLIN <=4 SENSITIVE Sensitive     CEFTRIAXONE <=1 SENSITIVE Sensitive     CIPROFLOXACIN <=0.25 SENSITIVE Sensitive     GENTAMICIN <=1 SENSITIVE Sensitive     IMIPENEM 1 SENSITIVE Sensitive     NITROFURANTOIN 128 RESISTANT Resistant     TRIMETH/SULFA <=20 SENSITIVE Sensitive     AMPICILLIN/SULBACTAM <=2 SENSITIVE Sensitive     PIP/TAZO <=4 SENSITIVE Sensitive     * 60,000  COLONIES/mL PROTEUS MIRABILIS  Blood Culture (routine x 2)     Status: None   Collection Time: 08/22/17 12:53 PM  Result Value Ref Range Status   Specimen Description BLOOD RIGHT ARM  Final   Special Requests   Final    BOTTLES DRAWN AEROBIC AND ANAEROBIC Blood Culture results may not be optimal due to an excessive volume of blood received in culture bottles   Culture   Final    NO GROWTH 5 DAYS Performed at [facility], [address] [place], [place] [address]    Report Status 08/27/2017 FINAL  Final  MRSA PCR Screening     Status: None   Collection Time: 08/22/17  9:18 PM  Result Value Ref Range Status   MRSA by PCR NEGATIVE NEGATIVE Final    Comment:        The GeneXpert MRSA Assay (FDA approved for NASAL specimens only), is one component of a comprehensive MRSA colonization surveillance program. It is not intended to diagnose MRSA infection nor to guide or monitor treatment for MRSA infections. Performed at [facility], [address] [place], [place] [address]   Culture, blood (Routine X 2) w Reflex to ID Panel     Status: None   Collection Time: 08/29/17  9:31 PM  Result Value Ref Range Status   Specimen Description BLOOD LEFT FOOT  Final   Special Requests   Final    BOTTLES DRAWN AEROBIC AND ANAEROBIC Blood Culture adequate volume   Culture   Final    NO GROWTH 5 DAYS Performed at [facility], [address], [place], [place] [address]    Report Status 09/03/2017 FINAL  Final  Culture, blood (Routine X 2) w Reflex to ID Panel     Status: None   Collection Time: 08/29/17  9:50 PM  Result Value Ref Range Status   Specimen Description BLOOD RIGHT ANKLE  Final   Special Requests   Final    BOTTLES DRAWN AEROBIC AND ANAEROBIC Blood Culture adequate volume   Culture   Final    NO GROWTH 5 DAYS Performed at [facility], [address] [place], [place] [address]    Report Status 09/03/2017 FINAL  Final  C difficile  quick scan w PCR reflex     Status: None   Collection Time: 09/02/17 12:31 PM  Result Value Ref Range Status   C Diff antigen NEGATIVE NEGATIVE Final   C Diff toxin NEGATIVE NEGATIVE Final   C Diff interpretation No  C. difficile detected.  Final    Comment: Performed at [facility], [address] [place], [place] [address]  CULTURE, BLOOD (ROUTINE X 2) w Reflex to ID Panel     Status: None (Preliminary result)   Collection Time: 09/02/17  7:25 PM  Result Value Ref Range Status   Specimen Description BLOOD BLOOD RIGHT HAND  Final   Special Requests   Final    BOTTLES DRAWN AEROBIC AND ANAEROBIC Blood Culture adequate volume   Culture   Final    NO GROWTH 2 DAYS Performed at [facility], [address], [place], [place] [address]    Report Status PENDING  Incomplete  CULTURE, BLOOD (ROUTINE X 2) w Reflex to ID Panel     Status: None (Preliminary result)   Collection Time: 09/02/17  7:25 PM  Result Value Ref Range Status   Specimen Description BLOOD BLOOD RIGHT HAND  Final   Special Requests   Final    BOTTLES DRAWN AEROBIC AND ANAEROBIC Blood Culture results may not be optimal due to an inadequate volume of blood received in culture bottles   Culture   Final    NO GROWTH 2 DAYS Performed at [facility], [address], [place], [place] [address]    Report Status PENDING  Incomplete  Urine Culture     Status: Abnormal   Collection Time: 09/03/17  4:23 AM  Result Value Ref Range Status   Specimen Description   Final    URINE, RANDOM Performed at [facility], [address] [place], [place] [address]    Special Requests   Final    NONE Performed at [facility], [address], [place], [place] [address]    Culture (A)  Final    >=100,000 COLONIES/mL DIPHTHEROIDS(CORYNEBACTERIUM SPECIES) Standardized susceptibility testing for this organism is not available. Performed at [facility], [place] [address],  [place], [place] [address]    Report Status 09/04/2017 FINAL  Final    Anti-infectives (From admission, onward)   Start     Dose/Rate Route Frequency Ordered Stop   08/25/17 1100  cefTRIAXone (ROCEPHIN) 1 g in sodium chloride 0.9 % 100 mL IVPB  Status:  Discontinued     1 g 200 mL/hr over 30 Minutes Intravenous Every 24 hours 08/25/17 1048 08/31/17 1538   08/25/17 1100  doxycycline (VIBRAMYCIN) 100 mg in sodium chloride 0.9 % 250 mL IVPB     100 mg 125 mL/hr over 120 Minutes Intravenous Every 12 hours 08/25/17 1057 09/04/17 0220   08/24/17 1230  ceFAZolin (ANCEF) IVPB 1 g/50 mL premix  Status:  Discontinued     1 g 100 mL/hr over 30 Minutes Intravenous Every 12 hours 08/24/17 1224 08/25/17 1048   08/22/17 2200  piperacillin-tazobactam (ZOSYN) IVPB 3.375 g  Status:  Discontinued     3.375 g 12.5 mL/hr over 240 Minutes Intravenous Every 8 hours 08/22/17 1509 08/24/17 1221   08/22/17 2200  vancomycin (VANCOCIN) IVPB 1000 mg/200 mL premix  Status:  Discontinued     1,000 mg 200 mL/hr over 60 Minutes Intravenous Every 24 hours 08/22/17 1509 08/23/17 2008   08/22/17 1400  piperacillin-tazobactam (ZOSYN) IVPB 3.375 g     3.375 g 100 mL/hr over 30 Minutes Intravenous  Once 08/22/17 1355 08/22/17 1431   08/22/17 1400  vancomycin (VANCOCIN) IVPB 1000 mg/200 mL premix     1,000 mg 200 mL/hr over 60 Minutes Intravenous  Once 08/22/17 1355 08/22/17 1510  CXR: NNF  MRI brain 06/19: No definite acute intracranial abnormality. 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  IMPRESSION: Severe sepsis of unclear etiology has resolved Recent tick bite - concern for ehrlichiosis (serologies negative) vs RSMF ID F/U. Has completed Doxycycline therapy Probable LLL PNA - treated Kelbsiella/proteus UTI - fully treated Elevated PCT - has been trending downward Acute encephalopathy resolved Elevated trop I, likely demand ischemia - resolving Pulmonary edema, resolved Chronic LBBB PAF with RVR  6/19 > NSR after amiodarone initiated Acute renal failure on HD- no HD today DM 2, controlled Elevated transaminases, resolved Thrombocytopenia, improving Acute protein-calorie malnutrition Low TSH, low free T4 of unclear etiology. Free T3 pending Diarrhoea- cdiff negative Hypokalemia Pyrexia- has resolved H/O breast Ca H/O CAD  PLAN/REC: Will need ischemia eval after recovers from this acute illness Monitor BMET intermittently Monitor I/Os Correct electrolytes as indicated HD per Nephrology  Monitor temp, WBC count Micro and abx as above Per ID Service - complete 10 days of doxycycline (through 6/21) DVT px: SCDs Transfuse per usual guidelines Cont TF protocol- will D/C once taking adequate oral intake Repeat cultures Will get PT/OT consult Start Diet   Family: Husband updated at bedside.  Thank you for allowing me the privileges to care for this patient.  Leroy Libman, MD PCCM service Pager [phone] 09/04/2017 6:38 PM

## 2017-09-04 NOTE — Evaluation (Signed)
Occupational Therapy Evaluation Patient Details Name: Bonnie Holt MRN: [id number] DOB: 1946-01-17 Today's Date: 09/04/2017    History of Present Illness Pt admitted to hospital on 08/22/17 and has been diagnosed with sepsis secondary to UTI, acute renal failure, and acute respiratory failure. Pts hospital stay has been complicated by fall on 03/17/56 intubation on 08/31/17 and extubation on 09/02/17. Pt has a past medical history that includes breast cancer, coronary artery disease, and HTN. Pt currently has temporary IJ catheter for HD, clearance from MD required for mobility.   Clinical Impression   Pt seen for OT evaluation this date. Prior to hospital admission, pt was independent, active, mowing the yard with her spouse just 2 days before this admission.  Pt lives her spouse and dog in a 1 story level entry home with good accessible bathroom set up. Currently pt demonstrates impairments in cognition (problem solving, safety awareness, follows simple commands, mild agitation/restless at times), strength, and activity tolerance requiring mod-max assist for ADL. Pt very fatigued, requires cues to remain alert during session. Spouse educated in energy conservation strategies handout provided. Pt too fatigued to attempt mobility or ADL tasks. Pt would benefit from skilled OT to address noted impairments and functional limitations (see below for any additional details) in order to maximize safety and independence while minimizing falls risk and caregiver burden.  Upon hospital discharge, recommend pt discharge to [facility].     Follow Up Recommendations  SNF    Equipment Recommendations  Other (comment)(anti-skid tape in shower, TBD for additional equipment needs)    Recommendations for Other Services       Precautions / Restrictions Precautions Precautions: Fall;Other (comment) Precaution Comments: L IJ HD cath, rectal tube Restrictions Weight Bearing Restrictions: No      Mobility Bed  Mobility Overal bed mobility: Needs Assistance Bed Mobility: Rolling Rolling: Min assist         General bed mobility comments: min assist to roll side to side, max to scoot up in bed  Transfers                 General transfer comment: unable to assess 2:2 impaired cognition    Balance                                           ADL either performed or assessed with clinical judgement   ADL Overall ADL's : Needs assistance/impaired Eating/Feeding: NPO;Minimal assistance;Moderate assistance Eating/Feeding Details (indicate cue type and reason): has NG tube, able to have ice chips, required assist as pt had difficulty sequencing/problem solving- attempting to use L non-dom hand to scoop ice chip and unaware that she had nothing on the spoon when she brought it to her mouth Grooming: Bed level;Minimal assistance   Upper Body Bathing: Bed level;Moderate assistance;Maximal assistance   Lower Body Bathing: Bed level;Maximal assistance   Upper Body Dressing : Bed level;Maximal assistance;Moderate assistance   Lower Body Dressing: Bed level;Maximal assistance                       Vision Baseline Vision/History: Wears glasses Wears Glasses: At all times Patient Visual Report: No change from baseline       Perception     Praxis      Pertinent Vitals/Pain Pain Assessment: 0-10 Pain Score: 8  Pain Location: back Pain Descriptors / Indicators: Aching;Grimacing;Restless Pain Intervention(s): Limited activity  within patient's tolerance;Monitored during session;Repositioned     Hand Dominance Right   Extremity/Trunk Assessment Upper Extremity Assessment Upper Extremity Assessment: Generalized weakness(grossly 4-/5 bilaterally)   Lower Extremity Assessment Lower Extremity Assessment: Generalized weakness(grossly at least 4/5 bilaterally)       Communication Communication Communication: No difficulties   Cognition Arousal/Alertness:  Awake/alert Behavior During Therapy: Restless;Agitated(mildly agitated but with minimal verbal cues stops pulling at nasal canula) Overall Cognitive Status: Impaired/Different from baseline                                 General Comments: oriented to self and place, follows simple commands but difficulty with multi step commands, mildy confused/agitated at times but stops pulling at NG tube with minimal verbal cues, some difficulty problem solving   General Comments       Exercises Other Exercises Other Exercises: Pt/spouse educated in DME/AE for bathroom to improve safety/independence, educated in ECS with handout provided - would benefit from additional training to support recall and carryover as well as trial AE/DME to determine proper equipment needs   Shoulder Instructions      Home Living Family/patient expects to be discharged to:: Private residence Living Arrangements: Spouse/significant other Available Help at Discharge: Family;Available 24 hours/day Type of Home: House Home Access: Level entry     Home Layout: One level     Bathroom Shower/Tub: Occupational psychologist: Handicapped height     Home Equipment: [place] - single point;Shower seat - built in;Grab bars - tub/shower;Hand held shower head   Additional Comments: fold down shower chair built into shower      Prior Functioning/Environment Level of Independence: Independent        Comments: Per spouse who was present, pt was independent 2 days prior to admission, no falls.        OT Problem List: Decreased strength;Decreased knowledge of use of DME or AE;Decreased activity tolerance;Decreased cognition;Pain;Decreased safety awareness;Impaired balance (sitting and/or standing)      OT Treatment/Interventions: Self-care/ADL training;Balance training;Therapeutic exercise;Therapeutic activities;Energy conservation;DME and/or AE instruction;Cognitive  remediation/compensation;Patient/family education    OT Goals(Current goals can be found in the care plan section) Acute Rehab OT Goals Patient Stated Goal: get better and go home OT Goal Formulation: With patient/family Time For Goal Achievement: 09/18/17 Potential to Achieve Goals: Good ADL Goals Pt Will Perform Upper Body Bathing: sitting;with min assist;with mod assist Pt Will Transfer to Toilet: with mod assist;stand pivot transfer;bedside commode;with min assist Additional ADL Goal #1: Pt/spouse will verbalize plan to implement at least 1 learned energy conservation strategy to maximize safety and independence while recovering and continuing to improve activity tolerance.  OT Frequency: Min 2X/week   Barriers to D/C:            Co-evaluation              AM-PAC PT "6 Clicks" Daily Activity     Outcome Measure Help from another person eating meals?: A Lot Help from another person taking care of personal grooming?: A Little Help from another person toileting, which includes using toliet, bedpan, or urinal?: Total Help from another person bathing (including washing, rinsing, drying)?: A Lot Help from another person to put on and taking off regular upper body clothing?: A Lot Help from another person to put on and taking off regular lower body clothing?: A Lot 6 Click Score: 12   End of Session    Activity Tolerance:  Patient limited by fatigue Patient left: in bed;with call bell/phone within reach;with bed alarm set;with nursing/sitter in room;with family/visitor present  OT Visit Diagnosis: Other abnormalities of gait and mobility (R26.89);Other symptoms and signs involving cognitive function                Time: 5449-2010 OT Time Calculation (min): 21 min Charges:  OT General Charges $OT Visit: 1 Visit OT Evaluation $OT Eval Moderate Complexity: 1 Mod OT Treatments $Self Care/Home Management : 8-22 mins   Jeni Salles, MPH, MS, OTR/L ascom  [phone] 09/04/17, 11:55 AM

## 2017-09-05 ENCOUNTER — Inpatient Hospital Stay: Payer: PPO

## 2017-09-05 LAB — RENAL FUNCTION PANEL
ALBUMIN: 2.6 g/dL — AB (ref 3.5–5.0)
ANION GAP: 12 (ref 5–15)
BUN: 38 mg/dL — AB (ref 6–20)
CALCIUM: 8.6 mg/dL — AB (ref 8.9–10.3)
CO2: 27 mmol/L (ref 22–32)
Chloride: 101 mmol/L (ref 101–111)
Creatinine, Ser: 4.3 mg/dL — ABNORMAL HIGH (ref 0.44–1.00)
GFR calc Af Amer: 11 mL/min — ABNORMAL LOW (ref >60)
GFR, EST NON AFRICAN AMERICAN: 9 mL/min — AB (ref >60)
GLUCOSE: 108 mg/dL — AB (ref 65–99)
PHOSPHORUS: 5.8 mg/dL — AB (ref 2.5–4.6)
Potassium: 3.9 mmol/L (ref 3.5–5.1)
SODIUM: 140 mmol/L (ref 135–145)

## 2017-09-05 LAB — GLUCOSE, CAPILLARY
GLUCOSE-CAPILLARY: 111 mg/dL — AB (ref 65–99)
GLUCOSE-CAPILLARY: 124 mg/dL — AB (ref 65–99)
GLUCOSE-CAPILLARY: 128 mg/dL — AB (ref 65–99)
GLUCOSE-CAPILLARY: 95 mg/dL (ref 65–99)
Glucose-Capillary: 132 mg/dL — ABNORMAL HIGH (ref 65–99)
Glucose-Capillary: 103 mg/dL — ABNORMAL HIGH (ref 65–99)
Glucose-Capillary: 105 mg/dL — ABNORMAL HIGH (ref 65–99)
Glucose-Capillary: 111 mg/dL — ABNORMAL HIGH (ref 65–99)

## 2017-09-05 LAB — CBC
HCT: 26.5 % — ABNORMAL LOW (ref 35.0–47.0)
Hemoglobin: 9.1 g/dL — ABNORMAL LOW (ref 12.0–16.0)
MCH: 31.7 pg (ref 26.0–34.0)
MCHC: 34.3 g/dL (ref 32.0–36.0)
MCV: 92.6 fL (ref 80.0–100.0)
Platelets: 153 10*3/uL (ref 150–440)
RBC: 2.86 MIL/uL — AB (ref 3.80–5.20)
RDW: 15.9 % — AB (ref 11.5–14.5)
WBC: 9.7 10*3/uL (ref 3.6–11.0)

## 2017-09-05 LAB — PROTEIN ELECTROPHORESIS, SERUM
A/G RATIO SPE: 0.8 (ref 0.7–1.7)
ALBUMIN ELP: 2.4 g/dL — AB (ref 2.9–4.4)
ALPHA-2-GLOBULIN: 0.9 g/dL (ref 0.4–1.0)
Alpha-1-Globulin: 0.5 g/dL — ABNORMAL HIGH (ref 0.0–0.4)
BETA GLOBULIN: 0.8 g/dL (ref 0.7–1.3)
Gamma Globulin: 1.1 g/dL (ref 0.4–1.8)
Globulin, Total: 3.2 g/dL (ref 2.2–3.9)
M-Spike, %: 0.3 g/dL — ABNORMAL HIGH
Total Protein ELP: 5.6 g/dL — ABNORMAL LOW (ref 6.0–8.5)

## 2017-09-05 MED ORDER — METOCLOPRAMIDE HCL 5 MG/ML IJ SOLN
5.0000 mg | Freq: Once | INTRAMUSCULAR | Status: AC
  Administered 2017-09-05: 5 mg via INTRAVENOUS
  Filled 2017-09-05: qty 2

## 2017-09-05 NOTE — Progress Notes (Signed)
Pt called complained of feeling nauseated and residual was checked , pt had >550 , Tube was placed on hold and Zofran given as ordered. Will cont to monitor.

## 2017-09-05 NOTE — Progress Notes (Signed)
   09/05/17 2050  Clinical Encounter Type  Visited With Other (Comment) (Pt's nurse)  Visit Type Follow-up   While rounding, [place] followed up with pt's nurse about pt's husband's concerns earlier.  [place] informed nurse that husband was agitated earlier about pt's lack of movement.  Nurse shared that pt was up and sitting in chair for over an hour earlier and pt did not want to move at 8pm, but nurse insisted she would move at 10pm.

## 2017-09-05 NOTE — Progress Notes (Signed)
   09/05/17 1345  Clinical Encounter Type  Visited With Family  Visit Type Follow-up  Consult/Referral To Chaplain  Spiritual Encounters  Spiritual Needs Emotional  Stress Factors  Family Stress Factors Health changes;Lack of knowledge   [facility] followed-up with pt's husband and extended family outside of the ICU. I visited with pt's husband, [name] on Friday and he was in high spirits then.  Now he was agitated and "complaining" (his own words).  He said pt had a setback (negative change in health) and he was getting contradictions from doctors and nurses.  Most of his agitation was stemming from doctors stating that the patient could benefit from movement and exercise and he claims that since he's been here at 6:30am, she has not been moved or turned.  [name] also mentioned that it was hard to hear pt say "When am I going home?"  Other family expressed concern over possible medication given at night affecting her mental state.  Family mentioned pt being unable to recall family visits the previous day.  [name] was called back into the ICU (It was unclear if his wife or the doctors were wanting to speak with him), but I preceded to leave and told him I would check back in soon.

## 2017-09-05 NOTE — Progress Notes (Signed)
[facility], [place] 09/05/17  Subjective:  She is currently on BiPAP. Last dialysis was on Friday. Patient remains oliguric as urine output was only 125 cc over the preceding 24 hours. Cr up to 4.3 off of dialysis.   Objective:  Vital signs in last 24 hours:  Temp:  [97.6 F (36.4 C)-98.5 F (36.9 C)] 98.5 F (36.9 C) (06/23 0100) Pulse Rate:  [90-116] 101 (06/23 0700) Resp:  [13-32] 13 (06/23 0700) BP: (125-195)/(56-75) 125/63 (06/23 0700) SpO2:  [89 %-98 %] 93 % (06/23 0700) FiO2 (%):  [28 %] 28 % (06/22 2021)  Weight change:  Filed Weights   09/02/17 1308 09/03/17 0317 09/04/17 0505  Weight: 107.9 kg (237 lb 14 oz) 107.8 kg (237 lb 10.5 oz) 109.3 kg (240 lb 15.4 oz)    Intake/Output:    Intake/Output Summary (Last 24 hours) at 09/05/2017 1014 Last data filed at 09/05/2017 0600 Gross per 24 hour  Intake 2460.5 ml  Output 1445 ml  Net 1015.5 ml   Physical Exam: General:  Laying in bed, on bipap  HEENT  anicteric OM moist  Neck:  Supple  Lungs:  Scattered rhonchi, on bipap  Heart::  irregular, no rubs  Abdomen:  Soft, nontender, BS present  Extremities:  1+ lower extremity edema  Neurologic:  Awake, alert, follows commands  Skin:  facial erythema noted    Basic Metabolic Panel:  Recent Labs  Lab 08/29/17 2104  08/30/17 1019  09/01/17 0310 09/02/17 0644 09/03/17 0423 09/04/17 0527 09/04/17 1702 09/05/17 0650  NA 133*   < >  --    < > 142 136 137 138  --  140  K 4.3   < >  --    < > 3.4* 3.4* 3.2* 2.6* 3.6 3.9  CL 101   < >  --    < > 104 102 99* 101  --  101  CO2 15*   < >  --    < > 26 22 26 26   --  27  GLUCOSE 213*   < >  --    < > 124* 159* 127* 137*  --  108*  BUN 91*   < >  --    < > 60* 53* 34* 22*  --  38*  CREATININE 10.07*   < >  --    < > 6.14* 5.25* 3.72* 2.74*  --  4.30*  CALCIUM 7.7*   < >  --    < > 7.8* 7.6* 8.1* 8.1*  --  8.6*  MG 2.7*  --   --   --   --   --  1.9  --   --   --   PHOS 7.7*  --  8.2*  --   3.9  --  4.1  4.2 3.7  --  5.8*   < > = values in this interval not displayed.     CBC: Recent Labs  Lab 09/01/17 0310 09/02/17 0435 09/03/17 0423 09/04/17 0527 09/05/17 0650  WBC 13.6* 13.7* 11.1* 11.3* 9.7  NEUTROABS  --   --  9.1*  --   --   HGB 9.0* 8.5* 8.3* 9.0* 9.1*  HCT 26.9* 25.0* 24.3* 26.3* 26.5*  MCV 91.7 91.9 91.7 91.8 92.6  PLT 108* 101* 102* 120* 153      Lab Results  Component Value Date   HEPBSAG Negative 08/29/2017   HEPBSAB Reactive 08/29/2017      Microbiology:  Recent Results (  from the past 240 hour(s))  Culture, blood (Routine X 2) w Reflex to ID Panel     Status: None   Collection Time: 08/29/17  9:31 PM  Result Value Ref Range Status   Specimen Description BLOOD LEFT FOOT  Final   Special Requests   Final    BOTTLES DRAWN AEROBIC AND ANAEROBIC Blood Culture adequate volume   Culture   Final    NO GROWTH 5 DAYS Performed at [facility], [address] [place], [place] [address]    Report Status 09/03/2017 FINAL  Final  Culture, blood (Routine X 2) w Reflex to ID Panel     Status: None   Collection Time: 08/29/17  9:50 PM  Result Value Ref Range Status   Specimen Description BLOOD RIGHT ANKLE  Final   Special Requests   Final    BOTTLES DRAWN AEROBIC AND ANAEROBIC Blood Culture adequate volume   Culture   Final    NO GROWTH 5 DAYS Performed at [facility], [address] [place], [place] [address]    Report Status 09/03/2017 FINAL  Final  C difficile quick scan w PCR reflex     Status: None   Collection Time: 09/02/17 12:31 PM  Result Value Ref Range Status   C Diff antigen NEGATIVE NEGATIVE Final   C Diff toxin NEGATIVE NEGATIVE Final   C Diff interpretation No C. difficile detected.  Final    Comment: Performed at [name], [address] [place], [place] [address]  CULTURE, BLOOD (ROUTINE X 2) w Reflex to ID Panel     Status: None (Preliminary result)   Collection Time: 09/02/17  7:25 PM   Result Value Ref Range Status   Specimen Description BLOOD BLOOD RIGHT HAND  Final   Special Requests   Final    BOTTLES DRAWN AEROBIC AND ANAEROBIC Blood Culture adequate volume   Culture   Final    NO GROWTH 3 DAYS Performed at [facility], [address], [place], [place] [address]    Report Status PENDING  Incomplete  CULTURE, BLOOD (ROUTINE X 2) w Reflex to ID Panel     Status: None (Preliminary result)   Collection Time: 09/02/17  7:25 PM  Result Value Ref Range Status   Specimen Description BLOOD BLOOD RIGHT HAND  Final   Special Requests   Final    BOTTLES DRAWN AEROBIC AND ANAEROBIC Blood Culture results may not be optimal due to an inadequate volume of blood received in culture bottles   Culture   Final    NO GROWTH 3 DAYS Performed at [facility], [address], [place], [place] [address]    Report Status PENDING  Incomplete  Urine Culture     Status: Abnormal   Collection Time: 09/03/17  4:23 AM  Result Value Ref Range Status   Specimen Description   Final    URINE, RANDOM Performed at [facility], [address], [place], [place] [address]    Special Requests   Final    NONE Performed at [facility], [address] [place], [place] [address]    Culture (A)  Final    >=100,000 COLONIES/mL DIPHTHEROIDS(CORYNEBACTERIUM SPECIES) Standardized susceptibility testing for this organism is not available. Performed at [facility], [place] [address] [place], [place] [address]    Report Status 09/04/2017 FINAL  Final    Coagulation Studies: No results for input(s): LABPROT, INR in the last 72 hours.  Urinalysis:  No results for input(s): COLORURINE, LABSPEC, [place], GLUCOSEU, HGBUR, BILIRUBINUR, KETONESUR, PROTEINUR, UROBILINOGEN, NITRITE, LEUKOCYTESUR in the last 72 hours.  Invalid input(s): APPERANCEUR    Imaging: No results found.   Medications:   . amiodarone 30 mg/hr (09/05/17 1013)   . B-complex with  vitamin C  1 tablet Per Tube QHS  . chlorhexidine  15 mL Mouth Rinse BID  . Chlorhexidine Gluconate Cloth  6 each Topical Q0600  . feeding supplement (NEPRO CARB STEADY)  1,000 mL Per Tube Q24H  . feeding supplement (PRO-STAT SUGAR FREE 64)  30 mL Per Tube Daily  . free water  200 mL Per Tube Q8H  . heparin injection (subcutaneous)  5,000 Units Subcutaneous Q8H  . insulin aspart  0-15 Units Subcutaneous Q4H  . insulin aspart  3 Units Subcutaneous Q4H  . insulin glargine  10 Units Subcutaneous Daily  . letrozole  2.5 mg Oral Daily  . mouth rinse  15 mL Mouth Rinse q12n4p  . nystatin cream   Topical BID  . pantoprazole sodium  40 mg Per Tube Daily  . sodium chloride flush  10-40 mL Intracatheter Q12H  . tuberculin  5 Units Intradermal Once   acetaminophen **OR** acetaminophen, bisacodyl, heparin lock flush, hydrALAZINE, ipratropium-albuterol, metoprolol tartrate, [DISCONTINUED] ondansetron **OR** ondansetron (ZOFRAN) IV, sodium chloride flush  Assessment/ Plan:  72 y.o. female with diabetes, coronary disease, sleep apnea, history of breast cancer, was admitted on 08/22/2017 with fever, chills, tick bite.   1.  Acute renal failure due to hypotension, sepsis. Oliguria persists.  2.  Sepsis 3.  Urinary tract infection-Klebsiella, Proteus 4.  Elevated troponin/demand ischemia 5.  Hypotension 6.  Thrombocytopenia 7.  Hyponatremia, Hypokalemia, both improved. 8.  Lower extremity Edema 9.  Acute respiratory failure, placed on ventilator 08/31/17, extubated 09/02/17.  Currently on bipap.   -Acute renal failure persist.  She remains oliguric at this time.  Therefore we will plan for another dialysis session tomorrow.  Her last dialysis session was on Friday.  Suspect that she may end up requiring prolonged hemodialysis.  Serum electrolytes including sodium and potassium have been corrected with dialysis and potassium repletion.  In regards to acute respiratory failure she appears to be doing  relatively well off of the ventilator.  However she is requiring BiPAP at the moment.  Further plan as patient progresses.    LOS: [place] 6/23/201910:14 [place], [place]  Note: This note was prepared with Dragon dictation. Any transcription errors are unintentional

## 2017-09-05 NOTE — Progress Notes (Signed)
Patient ID: Bonnie Holt, female   DOB: 1946-01-09, 72 y.o.   MRN: [id number]  [facility] PROGRESS NOTE  Bonnie Holt NFA:[id number] DOB: March 05, 1946 DOA: 08/22/2017 PCP: Kirk Ruths, MD  HPI/Subjective: Patient states she wants to go home.  Family with lots of questions.  Wondering why we are not giving her anything to eat or drink and not giving hydration.  Asking if we gave her  something last night to help her sleep because her mental status is a little more impaired today.  Objective: Vitals:   09/05/17 1100 09/05/17 1200  BP: (!) 151/66 (!) 167/68  Pulse: (!) 106 (!) 102  Resp: (!) 25 (!) 21  Temp:  99.4 F (37.4 C)  SpO2: 96% 96%    Intake/Output Summary (Last 24 hours) at 09/05/2017 1334 Last data filed at 09/05/2017 1021 Gross per 24 hour  Intake 2407.4 ml  Output 1370 ml  Net 1037.4 ml   Filed Weights   09/02/17 1308 09/03/17 0317 09/04/17 0505  Weight: 107.9 kg (237 lb 14 oz) 107.8 kg (237 lb 10.5 oz) 109.3 kg (240 lb 15.4 oz)    ROS: Review of Systems  Unable to perform ROS: Acuity of condition   Exam: Physical Exam  HENT:  Nose: No mucosal edema.  Mouth/Throat: No oropharyngeal exudate or posterior oropharyngeal edema.  Eyes: Pupils are equal, round, and reactive to light. Conjunctivae, EOM and lids are normal.  Neck: No JVD present. Carotid bruit is not present. No edema present. No thyroid mass and no thyromegaly present.  Cardiovascular: S1 normal and S2 normal. Exam reveals no gallop.  No murmur heard. Pulses:      Dorsalis pedis pulses are 2+ on the right side, and 2+ on the left side.  Respiratory: No respiratory distress. She has decreased breath sounds in the right lower field and the left lower field. She has no wheezes. She has no rhonchi. She has no rales.  GI: Soft. Bowel sounds are normal. There is no tenderness.  Musculoskeletal:       Right ankle: She exhibits swelling.       Left ankle: She exhibits swelling.   Lymphadenopathy:    She has no cervical adenopathy.  Neurological: She is alert. No cranial nerve deficit.  Skin: Skin is warm. No rash noted. Nails show no clubbing.  Psychiatric: Her affect is blunt.      Data Reviewed: Basic Metabolic Panel: Recent Labs  Lab 08/29/17 2104  08/30/17 1019  09/01/17 0310 09/02/17 4696 09/03/17 0423 09/04/17 0527 09/04/17 1702 09/05/17 0650  NA 133*   < >  --    < > 142 136 137 138  --  140  K 4.3   < >  --    < > 3.4* 3.4* 3.2* 2.6* 3.6 3.9  CL 101   < >  --    < > 104 102 99* 101  --  101  CO2 15*   < >  --    < > 26 22 26 26   --  27  GLUCOSE 213*   < >  --    < > 124* 159* 127* 137*  --  108*  BUN 91*   < >  --    < > 60* 53* 34* 22*  --  38*  CREATININE 10.07*   < >  --    < > 6.14* 5.25* 3.72* 2.74*  --  4.30*  CALCIUM 7.7*   < >  --    < >  7.8* 7.6* 8.1* 8.1*  --  8.6*  MG 2.7*  --   --   --   --   --  1.9  --   --   --   PHOS 7.7*  --  8.2*  --  3.9  --  4.1  4.2 3.7  --  5.8*   < > = values in this interval not displayed.   Liver Function Tests: Recent Labs  Lab 08/29/17 2104 08/31/17 0400 09/01/17 0310 09/03/17 0423 09/04/17 0527 09/05/17 0650  AST 69* 48* 40  --   --   --   ALT 12* 11* 8*  --   --   --   ALKPHOS 134* 112 110  --   --   --   BILITOT 0.6 0.8 0.8  --   --   --   PROT 6.0* 6.3* 6.7  --   --   --   ALBUMIN 2.7* 2.8* 2.9* 2.3* 2.4* 2.6*   CBC: Recent Labs  Lab 09/01/17 0310 09/02/17 0435 09/03/17 0423 09/04/17 0527 09/05/17 0650  WBC 13.6* 13.7* 11.1* 11.3* 9.7  NEUTROABS  --   --  9.1*  --   --   HGB 9.0* 8.5* 8.3* 9.0* 9.1*  HCT 26.9* 25.0* 24.3* 26.3* 26.5*  MCV 91.7 91.9 91.7 91.8 92.6  PLT 108* 101* 102* 120* 153   Cardiac Enzymes: Recent Labs  Lab 08/29/17 2104 08/30/17 0652 09/01/17 0310  CKTOTAL 98  --   --   TROPONINI 0.11* 0.12* 0.28*    CBG: Recent Labs  Lab 09/05/17 0011 09/05/17 0428 09/05/17 0719 09/05/17 0732 09/05/17 1249  GLUCAP 128* 132* 95 103* 124*     Recent Results (from the past 240 hour(s))  Culture, blood (Routine X 2) w Reflex to ID Panel     Status: None   Collection Time: 08/29/17  9:31 PM  Result Value Ref Range Status   Specimen Description BLOOD LEFT FOOT  Final   Special Requests   Final    BOTTLES DRAWN AEROBIC AND ANAEROBIC Blood Culture adequate volume   Culture   Final    NO GROWTH 5 DAYS Performed at [facility], [address] [place], [place] [address]    Report Status 09/03/2017 FINAL  Final  Culture, blood (Routine X 2) w Reflex to ID Panel     Status: None   Collection Time: 08/29/17  9:50 PM  Result Value Ref Range Status   Specimen Description BLOOD RIGHT ANKLE  Final   Special Requests   Final    BOTTLES DRAWN AEROBIC AND ANAEROBIC Blood Culture adequate volume   Culture   Final    NO GROWTH 5 DAYS Performed at [facility], [address], [place], [place] [address]    Report Status 09/03/2017 FINAL  Final  C difficile quick scan w PCR reflex     Status: None   Collection Time: 09/02/17 12:31 PM  Result Value Ref Range Status   C Diff antigen NEGATIVE NEGATIVE Final   C Diff toxin NEGATIVE NEGATIVE Final   C Diff interpretation No C. difficile detected.  Final    Comment: Performed at [facility], [address] [place], [place] [address]  CULTURE, BLOOD (ROUTINE X 2) w Reflex to ID Panel     Status: None (Preliminary result)   Collection Time: 09/02/17  7:25 PM  Result Value Ref Range Status   Specimen Description BLOOD BLOOD RIGHT HAND  Final  Special Requests   Final    BOTTLES DRAWN AEROBIC AND ANAEROBIC Blood Culture adequate volume   Culture   Final    NO GROWTH 3 DAYS Performed at [facility], [address] [place], [place] [address]    Report Status PENDING  Incomplete  CULTURE, BLOOD (ROUTINE X 2) w Reflex to ID Panel     Status: None (Preliminary result)   Collection Time: 09/02/17  7:25 PM  Result Value Ref Range Status   Specimen  Description BLOOD BLOOD RIGHT HAND  Final   Special Requests   Final    BOTTLES DRAWN AEROBIC AND ANAEROBIC Blood Culture results may not be optimal due to an inadequate volume of blood received in culture bottles   Culture   Final    NO GROWTH 3 DAYS Performed at [facility], [address] [place], [place] [address]    Report Status PENDING  Incomplete  Urine Culture     Status: Abnormal   Collection Time: 09/03/17  4:23 AM  Result Value Ref Range Status   Specimen Description   Final    URINE, RANDOM Performed at [facility], [address] [place], [place] [address]    Special Requests   Final    NONE Performed at [facility], [address] [place], [place] [address]    Culture (A)  Final    >=100,000 COLONIES/mL DIPHTHEROIDS(CORYNEBACTERIUM SPECIES) Standardized susceptibility testing for this organism is not available. Performed at [facility], [place] [address] [place], [place] [address]    Report Status 09/04/2017 FINAL  Final      Scheduled Meds: . B-complex with vitamin C  1 tablet Per Tube QHS  . chlorhexidine  15 mL Mouth Rinse BID  . Chlorhexidine Gluconate Cloth  6 each Topical Q0600  . feeding supplement (NEPRO CARB STEADY)  1,000 mL Per Tube Q24H  . feeding supplement (PRO-STAT SUGAR FREE 64)  30 mL Per Tube Daily  . free water  200 mL Per Tube Q8H  . heparin injection (subcutaneous)  5,000 Units Subcutaneous Q8H  . insulin aspart  0-15 Units Subcutaneous Q4H  . insulin aspart  3 Units Subcutaneous Q4H  . insulin glargine  10 Units Subcutaneous Daily  . letrozole  2.5 mg Oral Daily  . mouth rinse  15 mL Mouth Rinse q12n4p  . nystatin cream   Topical BID  . pantoprazole sodium  40 mg Per Tube Daily  . sodium chloride flush  10-40 mL Intracatheter Q12H  . tuberculin  5 Units Intradermal Once   Continuous Infusions: . amiodarone 30 mg/hr (09/05/17 1013)    Assessment/Plan:  1. Sepsis with multiorgan failure and  initial hypotension.  Likely combination of urinary tract infection versus tickborne disease.  Finished antibiotics. 2. Acute hypoxic respiratory failure with fluid overload.  Patient extubated on Thursday.  On nasal cannula  5 L currently.  Pulse ox 97% so do have room to titrate this down.   3. Acute kidney injury.  ATN from sepsis and hypotension.  Hemodialysis as per nephrology.  Scheduled for dialysis for tomorrow since creatinine worsened today.  creatinine 4.3 today. 4. Suspected ileus with tube feed residuals.  Abdominal x-ray done but not officially read yet.  Given a dose of Reglan. 5. Elevated troponin demand ischemia in the setting of sepsis. 6. Atrial fibrillation with rapid ventricular response.  On amiodarone drip.  Currently in normal sinus rhythm. 7. Thrombocytopenia secondary to sepsis 8. History of breast  cancer on Femara 9. Morbid obesity weight loss needed 10. History of sleep apnea on BiPAP at home  11. type 2 diabetes on sliding scale 12. Nutrition.  Patient on diet and also receiving tube feeds. 13. Weakness.  Need repeated physical therapy evaluation.  Code Status:     Code Status Orders  (From admission, onward)        Start     Ordered   08/22/17 1612  Full code  Continuous     08/22/17 1611    Code Status History    This patient has a current code status but no historical code status.     Family Communication: Husband and sister at the bedside Disposition Plan: To be determined based on clinical course  Consultants:  Critical care specialist  Nephrology  Time spent: 28 minutes  [place]

## 2017-09-05 NOTE — Progress Notes (Signed)
Pt residual still >600, cont to hold TF, NP notified.

## 2017-09-05 NOTE — Progress Notes (Signed)
Pt's family asked about urine output overnight and today. Pt has 20cc out documented overnight; however, family stated they witnessed pt's foley catheter being emptied "several times" last night. Pt noted to have a pure-wick in place at my shift assessment with 0 urine output this shift. Informed family of the documented output and they continued to argue that the foley was full "several times". Will continue to monitor.

## 2017-09-05 NOTE — Progress Notes (Signed)
Patient followed all commands. Denies pain.  Extubated 09/02/17  Vitals:   09/05/17 0400 09/05/17 0500 09/05/17 0600 09/05/17 0700  BP:  (!) 149/66 (!) 141/60 125/63  Pulse: 100 (!) 107 100 (!) 101  Resp: 20 (!) 25 (!) 21 13  Temp:      TempSrc:      SpO2: 93% 95% 98% 93%  Weight:      Height:       Gen: comfortable in bed, seen on BIPAP HEENT: NCAT, sclera white, dry oral cavity Neck: JVP not visualized Lungs: breath sounds full anteriorly without wheezes or other adventitious sounds Cardiovascular: RRR, no murmurs Abdomen: Obese, soft, nontender, normal BS Ext: without clubbing, cyanosis,1+ edema upper extremities Neuro: Awake and appropriate Skin: Limited exam, no lesions noted, groin yeast      BMP Latest Ref Rng & Units 09/05/2017 09/04/2017 09/04/2017  Glucose 65 - 99 mg/dL 108(H) - 137(H)  BUN 6 - 20 mg/dL 38(H) - 22(H)  Creatinine 0.44 - 1.00 mg/dL 4.30(H) - 2.74(H)  Sodium 135 - 145 mmol/L 140 - 138  Potassium 3.5 - 5.1 mmol/L 3.9 3.6 2.6(LL)  Chloride 101 - 111 mmol/L 101 - 101  CO2 22 - 32 mmol/L 27 - 26  Calcium 8.9 - 10.3 mg/dL 8.6(L) - 8.1(L)    CBC Latest Ref Rng & Units 09/05/2017 09/04/2017 09/03/2017  WBC 3.6 - 11.0 K/uL 9.7 11.3(H) 11.1(H)  Hemoglobin 12.0 - 16.0 g/dL 9.1(L) 9.0(L) 8.3(L)  Hematocrit 35.0 - 47.0 % 26.5(L) 26.3(L) 24.3(L)  Platelets 150 - 440 K/uL 153 120(L) 102(L)    Hepatic Function Latest Ref Rng & Units 09/05/2017 09/04/2017 09/03/2017  Total Protein 6.5 - 8.1 g/dL - - -  Albumin 3.5 - 5.0 g/dL 2.6(L) 2.4(L) 2.3(L)  AST 15 - 41 U/L - - -  ALT 14 - 54 U/L - - -  Alk Phosphatase 38 - 126 U/L - - -  Total Bilirubin 0.3 - 1.2 mg/dL - - -   Results for orders placed or performed during the hospital encounter of 08/22/17  Blood Culture (routine x 2)     Status: None   Collection Time: 08/22/17 12:52 PM  Result Value Ref Range Status   Specimen Description BLOOD RIGHT HAND  Final   Special Requests   Final    BOTTLES DRAWN AEROBIC AND  ANAEROBIC Blood Culture adequate volume   Culture   Final    NO GROWTH 5 DAYS Performed at [facility], [address], [place], [place] [address]    Report Status 08/27/2017 FINAL  Final  Urine culture     Status: Abnormal   Collection Time: 08/22/17 12:52 PM  Result Value Ref Range Status   Specimen Description   Final    URINE, RANDOM Performed at [facility], [address], [place], [place] [address]    Special Requests   Final    NONE Performed at [facility], [address] [place], [place] [address]    Culture (A)  Final    >=100,000 COLONIES/mL KLEBSIELLA PNEUMONIAE 60,000 COLONIES/mL PROTEUS MIRABILIS    Report Status 08/24/2017 FINAL  Final   Organism ID, Bacteria KLEBSIELLA PNEUMONIAE (A)  Final   Organism ID, Bacteria PROTEUS MIRABILIS (A)  Final      Susceptibility   Klebsiella pneumoniae - MIC*    AMPICILLIN RESISTANT Resistant     CEFAZOLIN <=4 SENSITIVE Sensitive     CEFTRIAXONE <=1 SENSITIVE Sensitive     CIPROFLOXACIN <=0.25 SENSITIVE Sensitive  GENTAMICIN <=1 SENSITIVE Sensitive     IMIPENEM <=0.25 SENSITIVE Sensitive     NITROFURANTOIN 64 INTERMEDIATE Intermediate     TRIMETH/SULFA <=20 SENSITIVE Sensitive     AMPICILLIN/SULBACTAM 4 SENSITIVE Sensitive     PIP/TAZO <=4 SENSITIVE Sensitive     Extended ESBL NEGATIVE Sensitive     * >=100,000 COLONIES/mL KLEBSIELLA PNEUMONIAE   Proteus mirabilis - MIC*    AMPICILLIN <=2 SENSITIVE Sensitive     CEFAZOLIN <=4 SENSITIVE Sensitive     CEFTRIAXONE <=1 SENSITIVE Sensitive     CIPROFLOXACIN <=0.25 SENSITIVE Sensitive     GENTAMICIN <=1 SENSITIVE Sensitive     IMIPENEM 1 SENSITIVE Sensitive     NITROFURANTOIN 128 RESISTANT Resistant     TRIMETH/SULFA <=20 SENSITIVE Sensitive     AMPICILLIN/SULBACTAM <=2 SENSITIVE Sensitive     PIP/TAZO <=4 SENSITIVE Sensitive     * 60,000 COLONIES/mL PROTEUS MIRABILIS  Blood Culture (routine x 2)     Status: None   Collection Time:  08/22/17 12:53 PM  Result Value Ref Range Status   Specimen Description BLOOD RIGHT ARM  Final   Special Requests   Final    BOTTLES DRAWN AEROBIC AND ANAEROBIC Blood Culture results may not be optimal due to an excessive volume of blood received in culture bottles   Culture   Final    NO GROWTH 5 DAYS Performed at [facility], [address] [place], [place] [address]    Report Status 08/27/2017 FINAL  Final  MRSA PCR Screening     Status: None   Collection Time: 08/22/17  9:18 PM  Result Value Ref Range Status   MRSA by PCR NEGATIVE NEGATIVE Final    Comment:        The GeneXpert MRSA Assay (FDA approved for NASAL specimens only), is one component of a comprehensive MRSA colonization surveillance program. It is not intended to diagnose MRSA infection nor to guide or monitor treatment for MRSA infections. Performed at [facility], [address] [place], [place] [address]   Culture, blood (Routine X 2) w Reflex to ID Panel     Status: None   Collection Time: 08/29/17  9:31 PM  Result Value Ref Range Status   Specimen Description BLOOD LEFT FOOT  Final   Special Requests   Final    BOTTLES DRAWN AEROBIC AND ANAEROBIC Blood Culture adequate volume   Culture   Final    NO GROWTH 5 DAYS Performed at [facility], [address], [place], [place] [address]    Report Status 09/03/2017 FINAL  Final  Culture, blood (Routine X 2) w Reflex to ID Panel     Status: None   Collection Time: 08/29/17  9:50 PM  Result Value Ref Range Status   Specimen Description BLOOD RIGHT ANKLE  Final   Special Requests   Final    BOTTLES DRAWN AEROBIC AND ANAEROBIC Blood Culture adequate volume   Culture   Final    NO GROWTH 5 DAYS Performed at [facility], [address] [place], [place] [address]    Report Status 09/03/2017 FINAL  Final  C difficile quick scan w PCR reflex     Status: None   Collection Time: 09/02/17 12:31 PM  Result Value Ref  Range Status   C Diff antigen NEGATIVE NEGATIVE Final   C Diff toxin NEGATIVE NEGATIVE Final   C Diff interpretation No C. difficile detected.  Final    Comment: Performed at [facility],  [place], [place] [address]  CULTURE, BLOOD (ROUTINE X 2) w Reflex to ID Panel     Status: None (Preliminary result)   Collection Time: 09/02/17  7:25 PM  Result Value Ref Range Status   Specimen Description BLOOD BLOOD RIGHT HAND  Final   Special Requests   Final    BOTTLES DRAWN AEROBIC AND ANAEROBIC Blood Culture adequate volume   Culture   Final    NO GROWTH 3 DAYS Performed at [facility], [address], [place], [place] [address]    Report Status PENDING  Incomplete  CULTURE, BLOOD (ROUTINE X 2) w Reflex to ID Panel     Status: None (Preliminary result)   Collection Time: 09/02/17  7:25 PM  Result Value Ref Range Status   Specimen Description BLOOD BLOOD RIGHT HAND  Final   Special Requests   Final    BOTTLES DRAWN AEROBIC AND ANAEROBIC Blood Culture results may not be optimal due to an inadequate volume of blood received in culture bottles   Culture   Final    NO GROWTH 3 DAYS Performed at [facility], [address], [place], [place] [address]    Report Status PENDING  Incomplete  Urine Culture     Status: Abnormal   Collection Time: 09/03/17  4:23 AM  Result Value Ref Range Status   Specimen Description   Final    URINE, RANDOM Performed at [facility], [address], [place], [place] [address]    Special Requests   Final    NONE Performed at [facility], [address] [place], [place] [address]    Culture (A)  Final    >=100,000 COLONIES/mL DIPHTHEROIDS(CORYNEBACTERIUM SPECIES) Standardized susceptibility testing for this organism is not available. Performed at [facility], [place] [address], [place],  [address]    Report Status 09/04/2017 FINAL  Final    Anti-infectives (From admission,  onward)   Start     Dose/Rate Route Frequency Ordered Stop   08/25/17 1100  cefTRIAXone (ROCEPHIN) 1 g in sodium chloride 0.9 % 100 mL IVPB  Status:  Discontinued     1 g 200 mL/hr over 30 Minutes Intravenous Every 24 hours 08/25/17 1048 08/31/17 1538   08/25/17 1100  doxycycline (VIBRAMYCIN) 100 mg in sodium chloride 0.9 % 250 mL IVPB     100 mg 125 mL/hr over 120 Minutes Intravenous Every 12 hours 08/25/17 1057 09/04/17 0220   08/24/17 1230  ceFAZolin (ANCEF) IVPB 1 g/50 mL premix  Status:  Discontinued     1 g 100 mL/hr over 30 Minutes Intravenous Every 12 hours 08/24/17 1224 08/25/17 1048   08/22/17 2200  piperacillin-tazobactam (ZOSYN) IVPB 3.375 g  Status:  Discontinued     3.375 g 12.5 mL/hr over 240 Minutes Intravenous Every 8 hours 08/22/17 1509 08/24/17 1221   08/22/17 2200  vancomycin (VANCOCIN) IVPB 1000 mg/200 mL premix  Status:  Discontinued     1,000 mg 200 mL/hr over 60 Minutes Intravenous Every 24 hours 08/22/17 1509 08/23/17 2008   08/22/17 1400  piperacillin-tazobactam (ZOSYN) IVPB 3.375 g     3.375 g 100 mL/hr over 30 Minutes Intravenous  Once 08/22/17 1355 08/22/17 1431   08/22/17 1400  vancomycin (VANCOCIN) IVPB 1000 mg/200 mL premix     1,000 mg 200 mL/hr over 60 Minutes Intravenous  Once 08/22/17 1355 08/22/17 1510      CXR: NNF  MRI brain 06/19: No definite acute intracranial abnormality.  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  IMPRESSION: Severe sepsis of unclear etiology has resolved Recent tick bite - concern for ehrlichiosis (serologies negative) vs RSMF ID F/U. Has completed Doxycycline therapy Probable LLL PNA - treated Kelbsiella/proteus UTI - fully treated Elevated PCT - has been trending downward Acute encephalopathy resolved Elevated trop I, likely demand ischemia - resolving Pulmonary edema, resolved Chronic LBBB PAF with RVR 6/19 > NSR after amiodarone initiated Acute renal failure on HD therapy as per Nephrology team DM 2,  controlled Elevated transaminases, resolved Thrombocytopenia, improving Acute protein-calorie malnutrition Low TSH, low free T4 of unclear etiology. Free T3 pending Diarrhoea- cdiff negative Ileus Hypokalemia resolved Pyrexia- has resolved H/O breast Ca H/O CAD  PLAN/REC: Will need ischemia eval after recovers from this acute illness Monitor I/Os Correct electrolytes as indicated HD per Nephrology   completed 10 days of doxycycline  DVT px: SCDs Transfuse per usual guidelines KUB to evaluate ileus Repeat cultures Will get PT/OT consult Start Diet when able   Family: Husband updated at bedside.  Thank you for allowing me the privileges to care for this patient.  Leroy Libman, MD PCCM service Pager [phone] 09/05/2017 9:05 AM

## 2017-09-06 ENCOUNTER — Inpatient Hospital Stay: Payer: PPO

## 2017-09-06 DIAGNOSIS — K567 Ileus, unspecified: Secondary | ICD-10-CM

## 2017-09-06 DIAGNOSIS — D6489 Other specified anemias: Secondary | ICD-10-CM

## 2017-09-06 LAB — RENAL FUNCTION PANEL
ANION GAP: 15 (ref 5–15)
Albumin: 2.5 g/dL — ABNORMAL LOW (ref 3.5–5.0)
BUN: 51 mg/dL — ABNORMAL HIGH (ref 6–20)
CALCIUM: 8.3 mg/dL — AB (ref 8.9–10.3)
CHLORIDE: 98 mmol/L — AB (ref 101–111)
CO2: 27 mmol/L (ref 22–32)
CREATININE: 5.69 mg/dL — AB (ref 0.44–1.00)
GFR calc non Af Amer: 7 mL/min — ABNORMAL LOW (ref >60)
GFR, EST AFRICAN AMERICAN: 8 mL/min — AB (ref >60)
Glucose, Bld: 118 mg/dL — ABNORMAL HIGH (ref 65–99)
Phosphorus: 7.8 mg/dL — ABNORMAL HIGH (ref 2.5–4.6)
Potassium: 3.7 mmol/L (ref 3.5–5.1)
SODIUM: 140 mmol/L (ref 135–145)

## 2017-09-06 LAB — CBC
HCT: 24.4 % — ABNORMAL LOW (ref 35.0–47.0)
HEMOGLOBIN: 8.2 g/dL — AB (ref 12.0–16.0)
MCH: 31.2 pg (ref 26.0–34.0)
MCHC: 33.4 g/dL (ref 32.0–36.0)
MCV: 93.4 fL (ref 80.0–100.0)
PLATELETS: 150 10*3/uL (ref 150–440)
RBC: 2.62 MIL/uL — ABNORMAL LOW (ref 3.80–5.20)
RDW: 16.7 % — ABNORMAL HIGH (ref 11.5–14.5)
WBC: 9.2 10*3/uL (ref 3.6–11.0)

## 2017-09-06 LAB — HEMOGLOBIN AND HEMATOCRIT, BLOOD
HEMATOCRIT: 23.5 % — AB (ref 35.0–47.0)
HEMATOCRIT: 23.3 % — AB (ref 35.0–47.0)
HEMOGLOBIN: 7.8 g/dL — AB (ref 12.0–16.0)
Hemoglobin: 7.7 g/dL — ABNORMAL LOW (ref 12.0–16.0)

## 2017-09-06 LAB — GLUCOSE, CAPILLARY
GLUCOSE-CAPILLARY: 112 mg/dL — AB (ref 65–99)
GLUCOSE-CAPILLARY: 98 mg/dL (ref 65–99)
GLUCOSE-CAPILLARY: 96 mg/dL (ref 65–99)
Glucose-Capillary: 106 mg/dL — ABNORMAL HIGH (ref 65–99)
Glucose-Capillary: 106 mg/dL — ABNORMAL HIGH (ref 65–99)
Glucose-Capillary: 112 mg/dL — ABNORMAL HIGH (ref 65–99)

## 2017-09-06 LAB — GLOMERULAR BASEMENT MEMBRANE ANTIBODIES: GBM AB: 5 U (ref 0–20)

## 2017-09-06 MED ORDER — BISACODYL 10 MG RE SUPP
10.0000 mg | Freq: Once | RECTAL | Status: DC
  Filled 2017-09-06: qty 1

## 2017-09-06 MED ORDER — TUBERCULIN PPD 5 UNIT/0.1ML ID SOLN
5.0000 [IU] | Freq: Once | INTRADERMAL | Status: DC
  Filled 2017-09-06: qty 0.1

## 2017-09-06 MED ORDER — FLEET ENEMA 7-19 GM/118ML RE ENEM
1.0000 | ENEMA | Freq: Once | RECTAL | Status: AC
  Administered 2017-09-06: 1 via RECTAL

## 2017-09-06 MED ORDER — SODIUM CHLORIDE 0.9 % IV SOLN
500.0000 mg | Freq: Three times a day (TID) | INTRAVENOUS | Status: DC
  Administered 2017-09-06: 500 mg via INTRAVENOUS
  Filled 2017-09-06 (×4): qty 10

## 2017-09-06 MED ORDER — FAMOTIDINE IN NACL 20-0.9 MG/50ML-% IV SOLN
20.0000 mg | INTRAVENOUS | Status: DC
  Administered 2017-09-06: 20 mg via INTRAVENOUS
  Filled 2017-09-06: qty 50

## 2017-09-06 MED ORDER — DEXTROSE-NACL 5-0.45 % IV SOLN
INTRAVENOUS | Status: DC
  Administered 2017-09-06 – 2017-09-07 (×2): 35 mL/h via INTRAVENOUS

## 2017-09-06 NOTE — Progress Notes (Signed)
Patient followed all commands. Denies pain. Increase NGT output in the last 24 hours.  Extubated 09/02/17  Vitals:   09/06/17 0800 09/06/17 0900 09/06/17 1000 09/06/17 1100  BP: (!) 135/55 134/61 (!) 124/57 136/61  Pulse: 85 100 86 86  Resp: 19 (!) _0 Temp: 98.3 F (36.8 C)     TempSrc: Axillary     SpO2: 95% 96% 97% 97%  Weight:      Height:       Gen: comfortable in bed, seen on BIPA HEENT: NCAT, sclera white, dry oral cavity Neck: JVP not visualized Lungs: breath sounds full anteriorly without wheezes or other adventitious sounds Cardiovascular: RRR, no murmurs Abdomen: Obese, soft, nontender, inaudible BS Ext: without clubbing, cyanosis,1+ edema upper extremities Neuro: Awake and appropriate Skin: Limited exam, no lesions noted, groin yeast      BMP Latest Ref Rng & Units 09/06/2017 09/05/2017 09/04/2017  Glucose 65 - 99 mg/dL 118(H) 108(H) -  BUN 6 - 20 mg/dL 51(H) 38(H) -  Creatinine 0.44 - 1.00 mg/dL 5.69(H) 4.30(H) -  Sodium 135 - 145 mmol/L 140 140 -  Potassium 3.5 - 5.1 mmol/L 3.7 3.9 3.6  Chloride 101 - 111 mmol/L 98(L) 101 -  CO2 22 - 32 mmol/L 27 27 -  Calcium 8.9 - 10.3 mg/dL 8.3(L) 8.6(L) -    CBC Latest Ref Rng & Units 09/06/2017 09/06/2017 09/05/2017  WBC 3.6 - 11.0 K/uL - 9.2 9.7  Hemoglobin 12.0 - 16.0 g/dL 7.8(L) 8.2(L) 9.1(L)  Hematocrit 35.0 - 47.0 % 23.5(L) 24.4(L) 26.5(L)  Platelets 150 - 440 K/uL - 150 153    Hepatic Function Latest Ref Rng & Units 09/06/2017 09/05/2017 09/04/2017  Total Protein 6.5 - 8.1 g/dL - - -  Albumin 3.5 - 5.0 g/dL 2.5(L) 2.6(L) 2.4(L)  AST 15 - 41 U/L - - -  ALT 14 - 54 U/L - - -  Alk Phosphatase 38 - 126 U/L - - -  Total Bilirubin 0.3 - 1.2 mg/dL - - -   Results for orders placed or performed during the hospital encounter of 08/22/17  Blood Culture (routine x 2)     Status: None   Collection Time: 08/22/17 12:52 PM  Result Value Ref Range Status   Specimen Description BLOOD RIGHT HAND  Final   Special Requests    Final    BOTTLES DRAWN AEROBIC AND ANAEROBIC Blood Culture adequate volume   Culture   Final    NO GROWTH 5 DAYS Performed at [facility], [address], [place], [place] [address]    Report Status 08/27/2017 FINAL  Final  Urine culture     Status: Abnormal   Collection Time: 08/22/17 12:52 PM  Result Value Ref Range Status   Specimen Description   Final    URINE, RANDOM Performed at [facility], [address], [place], [place] [address]    Special Requests   Final    NONE Performed at [facility], [address] [place],  [address]    Culture (A)  Final    >=100,000 COLONIES/mL KLEBSIELLA PNEUMONIAE 60,000 COLONIES/mL PROTEUS MIRABILIS    Report Status 08/24/2017 FINAL  Final   Organism ID, Bacteria KLEBSIELLA PNEUMONIAE (A)  Final   Organism ID, Bacteria PROTEUS MIRABILIS (A)  Final      Susceptibility   Klebsiella pneumoniae - MIC*    AMPICILLIN RESISTANT Resistant     CEFAZOLIN <=4 SENSITIVE Sensitive     CEFTRIAXONE <=1 SENSITIVE Sensitive  CIPROFLOXACIN <=0.25 SENSITIVE Sensitive     GENTAMICIN <=1 SENSITIVE Sensitive     IMIPENEM <=0.25 SENSITIVE Sensitive     NITROFURANTOIN 64 INTERMEDIATE Intermediate     TRIMETH/SULFA <=20 SENSITIVE Sensitive     AMPICILLIN/SULBACTAM 4 SENSITIVE Sensitive     PIP/TAZO <=4 SENSITIVE Sensitive     Extended ESBL NEGATIVE Sensitive     * >=100,000 COLONIES/mL KLEBSIELLA PNEUMONIAE   Proteus mirabilis - MIC*    AMPICILLIN <=2 SENSITIVE Sensitive     CEFAZOLIN <=4 SENSITIVE Sensitive     CEFTRIAXONE <=1 SENSITIVE Sensitive     CIPROFLOXACIN <=0.25 SENSITIVE Sensitive     GENTAMICIN <=1 SENSITIVE Sensitive     IMIPENEM 1 SENSITIVE Sensitive     NITROFURANTOIN 128 RESISTANT Resistant     TRIMETH/SULFA <=20 SENSITIVE Sensitive     AMPICILLIN/SULBACTAM <=2 SENSITIVE Sensitive     PIP/TAZO <=4 SENSITIVE Sensitive     * 60,000 COLONIES/mL PROTEUS MIRABILIS  Blood Culture (routine x 2)      Status: None   Collection Time: 08/22/17 12:53 PM  Result Value Ref Range Status   Specimen Description BLOOD RIGHT ARM  Final   Special Requests   Final    BOTTLES DRAWN AEROBIC AND ANAEROBIC Blood Culture results may not be optimal due to an excessive volume of blood received in culture bottles   Culture   Final    NO GROWTH 5 DAYS Performed at [facility], [address] [place], [place] [address]    Report Status 08/27/2017 FINAL  Final  MRSA PCR Screening     Status: None   Collection Time: 08/22/17  9:18 PM  Result Value Ref Range Status   MRSA by PCR NEGATIVE NEGATIVE Final    Comment:        The GeneXpert MRSA Assay (FDA approved for NASAL specimens only), is one component of a comprehensive MRSA colonization surveillance program. It is not intended to diagnose MRSA infection nor to guide or monitor treatment for MRSA infections. Performed at [facility], [address] [place], [place] [address]   Culture, blood (Routine X 2) w Reflex to ID Panel     Status: None   Collection Time: 08/29/17  9:31 PM  Result Value Ref Range Status   Specimen Description BLOOD LEFT FOOT  Final   Special Requests   Final    BOTTLES DRAWN AEROBIC AND ANAEROBIC Blood Culture adequate volume   Culture   Final    NO GROWTH 5 DAYS Performed at [facility], [address] [place], [place] [address]    Report Status 09/03/2017 FINAL  Final  Culture, blood (Routine X 2) w Reflex to ID Panel     Status: None   Collection Time: 08/29/17  9:50 PM  Result Value Ref Range Status   Specimen Description BLOOD RIGHT ANKLE  Final   Special Requests   Final    BOTTLES DRAWN AEROBIC AND ANAEROBIC Blood Culture adequate volume   Culture   Final    NO GROWTH 5 DAYS Performed at [facility], [address], [place], [place] [address]    Report Status 09/03/2017 FINAL  Final  C difficile quick scan w PCR reflex     Status: None   Collection Time:  09/02/17 12:31 PM  Result Value Ref Range Status   C Diff antigen NEGATIVE NEGATIVE Final   C Diff toxin NEGATIVE NEGATIVE Final   C Diff interpretation No C. difficile detected.  Final  Comment: Performed at [facility], [address] [place], [place] [address]  CULTURE, BLOOD (ROUTINE X 2) w Reflex to ID Panel     Status: None (Preliminary result)   Collection Time: 09/02/17  7:25 PM  Result Value Ref Range Status   Specimen Description BLOOD BLOOD RIGHT HAND  Final   Special Requests   Final    BOTTLES DRAWN AEROBIC AND ANAEROBIC Blood Culture adequate volume   Culture   Final    NO GROWTH 4 DAYS Performed at [facility], [address], [place], [place] [address]    Report Status PENDING  Incomplete  CULTURE, BLOOD (ROUTINE X 2) w Reflex to ID Panel     Status: None (Preliminary result)   Collection Time: 09/02/17  7:25 PM  Result Value Ref Range Status   Specimen Description BLOOD BLOOD RIGHT HAND  Final   Special Requests   Final    BOTTLES DRAWN AEROBIC AND ANAEROBIC Blood Culture results may not be optimal due to an inadequate volume of blood received in culture bottles   Culture   Final    NO GROWTH 4 DAYS Performed at [facility], [address] [place], [place] [address]    Report Status PENDING  Incomplete  Urine Culture     Status: Abnormal   Collection Time: 09/03/17  4:23 AM  Result Value Ref Range Status   Specimen Description   Final    URINE, RANDOM Performed at [facility], [address], [place], [place] [address]    Special Requests   Final    NONE Performed at [facility], [address] [place], [place] [address]    Culture (A)  Final    >=100,000 COLONIES/mL DIPHTHEROIDS(CORYNEBACTERIUM SPECIES) Standardized susceptibility testing for this organism is not available. Performed at [facility], [place] [address], [place],  [address]    Report Status 09/04/2017 FINAL  Final     Anti-infectives (From admission, onward)   Start     Dose/Rate Route Frequency Ordered Stop   09/06/17 1400  erythromycin 500 mg in sodium chloride 0.9 % 100 mL IVPB     500 mg 100 mL/hr over 60 Minutes Intravenous Every 8 hours 09/06/17 1113     08/25/17 1100  cefTRIAXone (ROCEPHIN) 1 g in sodium chloride 0.9 % 100 mL IVPB  Status:  Discontinued     1 g 200 mL/hr over 30 Minutes Intravenous Every 24 hours 08/25/17 1048 08/31/17 1538   08/25/17 1100  doxycycline (VIBRAMYCIN) 100 mg in sodium chloride 0.9 % 250 mL IVPB     100 mg 125 mL/hr over 120 Minutes Intravenous Every 12 hours 08/25/17 1057 09/04/17 0220   08/24/17 1230  ceFAZolin (ANCEF) IVPB 1 g/50 mL premix  Status:  Discontinued     1 g 100 mL/hr over 30 Minutes Intravenous Every 12 hours 08/24/17 1224 08/25/17 1048   08/22/17 2200  piperacillin-tazobactam (ZOSYN) IVPB 3.375 g  Status:  Discontinued     3.375 g 12.5 mL/hr over 240 Minutes Intravenous Every 8 hours 08/22/17 1509 08/24/17 1221   08/22/17 2200  vancomycin (VANCOCIN) IVPB 1000 mg/200 mL premix  Status:  Discontinued     1,000 mg 200 mL/hr over 60 Minutes Intravenous Every 24 hours 08/22/17 1509 08/23/17 2008   08/22/17 1400  piperacillin-tazobactam (ZOSYN) IVPB 3.375 g     3.375 g 100 mL/hr over 30 Minutes Intravenous  Once 08/22/17 1355 08/22/17 1431   08/22/17 1400  vancomycin (VANCOCIN) IVPB 1000 mg/200 mL premix     1,000 mg 200 mL/hr over 60 Minutes Intravenous  Once 08/22/17 1355 08/22/17 1510      CXR: NNF  MRI brain 06/19: No definite acute intracranial abnormality. 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  IMPRESSION: Severe sepsis of unclear etiology has resolved Recent tick bite - concern for ehrlichiosis (serologies negative) vs RSMF ID F/U. Has completed Doxycycline therapy Probable LLL PNA - treated Kelbsiella/proteus UTI - fully treated Acute encephalopathy resolved Elevated trop I, likely demand ischemia -  resolving Pulmonary edema, resolved Chronic LBBB PAF with RVR 6/19 > NSR after amiodarone initiated Acute renal failure on HD therapy as per Nephrology team DM 2, controlled Elevated transaminases, resolved Thrombocytopenia, improving Acute protein-calorie malnutrition Low TSH, low free T4 of unclear etiology. Free T3 pending Diarrhoea- cdiff negative Ileus- persistent Hypokalemia resolved Pyrexia- has resolved; repeated cultures negative Anemia of critical illness H/O breast Ca H/O CAD  PLAN/REC: Will need ischemia eval after recovers from this acute illness Monitor I/Os Correct electrolytes as indicated HD per Nephrology   completed 10 days of doxycycline  DVT px: SCDs Transfuse per usual guideline Will get PT/OT consult Keep NPO/ Low volume IVF Will give suppository and re-evaluate with KUB for ileus   Family: Husband updated at bedside.  Thank you for allowing me the privileges to care for this patient.  Leroy Libman, MD PCCM service Pager [phone] 09/06/2017 11:35 AM

## 2017-09-06 NOTE — Progress Notes (Signed)
Post HD Tx    09/06/17 2005  Hand-Off documentation  Report given to (Full Name) Lucky Rathke, RN   Vital Signs  Pulse Rate 84  Pulse Rate Source Monitor  Resp 19  BP 135/61  BP Location Right Leg  BP Method Automatic  Patient Position (if appropriate) Lying  Oxygen Therapy  SpO2 98 %  O2 Device Nasal Cannula  Pain Assessment  Pain Scale 0-10  Pain Score 0  Dialysis Weight  Weight 110.7 kg (244 lb 0.8 oz)  Type of Weight Post-Dialysis  Post-Hemodialysis Assessment  Rinseback Volume (mL) 250 mL  Dialyzer Clearance Lightly streaked  Duration of HD Treatment -hour(s) 3 hour(s)  Hemodialysis Intake (mL) 500 mL  UF Total -Machine (mL) 2136 mL  Net UF (mL) 1636 mL  Tolerated HD Treatment Yes  Hemodialysis Catheter Left Internal jugular Triple-lumen  Placement Date/Time: 08/29/17 1400   Time Out: Correct patient;Correct site;Correct procedure  Maximum sterile barrier precautions: Hand hygiene;Sterile gloves;Cap;Large sterile sheet;Mask;Sterile gown  Site Prep: Chlorhexidine  Local Anesthetic: Inje...  Post treatment catheter status Capped and Clamped

## 2017-09-06 NOTE — Progress Notes (Signed)
Physical Therapy Treatment Patient Details Name: Bonnie Holt MRN: [id number] DOB: 08/18/1945 Today's Date: 09/06/2017    History of Present Illness Pt admitted to hospital on 08/22/17 and has been diagnosed with sepsis secondary to UTI, acute renal failure, and acute respiratory failure. Pts hospital stay has been complicated by fall on 7/61/95 intubation on 08/31/17 and extubation on 09/02/17. Pt has a past medical history that includes breast cancer, coronary artery disease, and HTN. Pt currently has temporary IJ catheter for HD, clearance from MD required for mobility.    PT Comments    Pt states that she is doing well this morning and is very agreeable to work with therapy. Pt states that she is eager to get out of bed. Cognitively pt appears much improved since previous visit and does not appear to be agitated during session. Pt instructed in LE strengthening exercises which she tolerates well however fatigues quickly. Pt could benefit from continued skilled therapy at this time to increase towards PLOF. PT will attempt EOB activities at next session once cleared from MD. PT will continue to work with pt at least 2x/week while admitted.    Follow Up Recommendations  SNF     Equipment Recommendations  None recommended by PT    Recommendations for Other Services       Precautions / Restrictions Precautions Precautions: Fall;Other (comment) Precaution Comments: L IJ HD cath, rectal tube Restrictions Weight Bearing Restrictions: No    Mobility  Bed Mobility Overal bed mobility: Needs Assistance Bed Mobility: (scooting) Rolling: Min guard         General bed mobility comments: pt able to scoot up in bed with min guarding without increaseing ROM of neck  Transfers                 General transfer comment: unable to assess at this time due to pt fatigue and lack of clearance from MD due to temporary IJ HD cath  Ambulation/Gait                 Stairs              Wheelchair Mobility    Modified Rankin (Stroke Patients Only)       Balance                                            Cognition Arousal/Alertness: Awake/alert Behavior During Therapy: WFL for tasks assessed/performed Overall Cognitive Status: Within Functional Limits for tasks assessed                                 General Comments: pt unable to recall recent events leading to this hospitalization which she states is "a little upsetting", follows all commands      Exercises Other Exercises Other Exercises: pt instructed and performed B LE ther-ex including resisted SLR, isometric hip adduction, resisted knee extension, resisted abd, ankle pumps, glut sets, resisted heel slides, initating a glut bridge and SAQ x12. required verbal cuing throughout ther-ex for safety and technique  Other Exercises: Pt instructed in BUE and BLE ther-ex including shoulder flexion arm raises (concentric, eccentric, and isometric), bicep curls, SLR, ankle pumps and heel slides to increase pt's strength and activity tolerance for mobility and ADL tasks    General Comments General comments (skin integrity, edema,  etc.): pt displays pitting edema in LEs evident      Pertinent Vitals/Pain Pain Assessment: 0-10 Pain Score: 3  Pain Location: back Pain Descriptors / Indicators: Aching Pain Intervention(s): Limited activity within patient's tolerance;Monitored during session;Repositioned    Home Living                      Prior Function            PT Goals (current goals can now be found in the care plan section) Acute Rehab PT Goals Patient Stated Goal: get better and go home PT Goal Formulation: With patient Time For Goal Achievement: 09/17/17 Potential to Achieve Goals: Fair Progress towards PT goals: Progressing toward goals    Frequency    Min 2X/week      PT Plan Current plan remains appropriate    Co-evaluation               AM-PAC PT "6 Clicks" Daily Activity  Outcome Measure  Difficulty turning over in bed (including adjusting bedclothes, sheets and blankets)?: Unable Difficulty moving from lying on back to sitting on the side of the bed? : Unable Difficulty sitting down on and standing up from a chair with arms (e.g., wheelchair, bedside commode, etc,.)?: Unable Help needed moving to and from a bed to chair (including a wheelchair)?: Total Help needed walking in hospital room?: Total Help needed climbing 3-5 steps with a railing? : Total 6 Click Score: 6    End of Session   Activity Tolerance: Patient tolerated treatment well Patient left: in bed;with call bell/phone within reach;with nursing/sitter in room   PT Visit Diagnosis: Unsteadiness on feet (R26.81);Other abnormalities of gait and mobility (R26.89);Muscle weakness (generalized) (M62.81);Ataxic gait (R26.0);Difficulty in walking, not elsewhere classified (R26.2);Pain Pain - Right/Left: (low back) Pain - part of body: (low back)     Time: 7414-2395 PT Time Calculation (min) (ACUTE ONLY): 19 min  Charges:                       G Codes:       Domanic Matusek, SPT    Staley Budzinski 09/06/2017, 12:00 PM

## 2017-09-06 NOTE — Progress Notes (Signed)
Post HD Assessment. No change from baseli   09/06/17 2010  Neurological  Level of Consciousness Alert  Orientation Level Oriented to person;Oriented to place;Oriented to situation;Disoriented to time  Respiratory  Respiratory Pattern Regular;Unlabored  Chest Assessment Chest expansion symmetrical  Bilateral Breath Sounds Clear  Cough None  Cardiac  Pulse Regular  Heart Sounds S1, S2  Vascular  R Radial Pulse +2  L Radial Pulse +2  Edema Generalized  Generalized Edema +1  Psychosocial  Psychosocial (WDL) WDL  Patient Behaviors Cooperative;Calm  ne, pt more alert aware of time and place.

## 2017-09-06 NOTE — Progress Notes (Signed)
[facility]  ROUNDING NOTE   Subjective:   Husband at bedside.   Anuric.  NG - 1150  Amiodarone gtt  5L [place]  Hemodialysis for later today.   Objective:  Vital signs in last 24 hours:  Temp:  [98.2 F (36.8 C)-99.4 F (37.4 C)] 98.3 F (36.8 C) (06/24 0800) Pulse Rate:  [85-106] 100 (06/24 0900) Resp:  [16-31] 25 (06/24 0900) BP: (104-197)/(55-79) 134/61 (06/24 0900) SpO2:  [83 %-99 %] 96 % (06/24 0900) Weight:  [110.9 kg (244 lb 7.8 oz)] 110.9 kg (244 lb 7.8 oz) (06/24 0432)  Weight change:  Filed Weights   09/03/17 0317 09/04/17 0505 09/06/17 0432  Weight: 107.8 kg (237 lb 10.5 oz) 109.3 kg (240 lb 15.4 oz) 110.9 kg (244 lb 7.8 oz)    Intake/Output: I/O last 3 completed shifts: In: 2474.6 [I.V.:704.6; Other:240; NG/GT:1530] Out: 2320 [Urine:20; Emesis/NG output:2300]   Intake/Output this shift:  Total I/O In: 33.4 [I.V.:33.4] Out: -   Physical Exam: General: Ill appearing  Head: NG tube to suction  Eyes: Anicteric, PERRL  Neck: Supple, trachea midline  Lungs:  Clear to auscultation  Heart: Irregular rhythm  Abdomen:  Hypoactive bowel sounds. nontender  Extremities: trace peripheral edema.  Neurologic: Nonfocal, moving all four extremities  Skin: No lesions  Access: Left IJ temp HD catheter    Basic Metabolic Panel: Recent Labs  Lab 09/01/17 0310 09/02/17 0644 09/03/17 0423 09/04/17 0527 09/04/17 1702 09/05/17 0650 09/06/17 0430  NA 142 136 137 138  --  140 140  K 3.4* 3.4* 3.2* 2.6* 3.6 3.9 3.7  CL 104 102 99* 101  --  101 98*  CO2 26 22 26 26   --  27 27  GLUCOSE 124* 159* 127* 137*  --  108* 118*  BUN 60* 53* 34* 22*  --  38* 51*  CREATININE 6.14* 5.25* 3.72* 2.74*  --  4.30* 5.69*  CALCIUM 7.8* 7.6* 8.1* 8.1*  --  8.6* 8.3*  MG  --   --  1.9  --   --   --   --   PHOS 3.9  --  4.1  4.2 3.7  --  5.8* 7.8*    Liver Function Tests: Recent Labs  Lab 08/31/17 0400 09/01/17 0310 09/03/17 0423 09/04/17 0527 09/05/17 0650  09/06/17 0430  AST 48* 40  --   --   --   --   ALT 11* 8*  --   --   --   --   ALKPHOS 112 110  --   --   --   --   BILITOT 0.8 0.8  --   --   --   --   PROT 6.3* 6.7  --   --   --   --   ALBUMIN 2.8* 2.9* 2.3* 2.4* 2.6* 2.5*   No results for input(s): LIPASE, AMYLASE in the last 168 hours. No results for input(s): AMMONIA in the last 168 hours.  CBC: Recent Labs  Lab 09/02/17 0435 09/03/17 0423 09/04/17 0527 09/05/17 0650 09/06/17 0431  WBC 13.7* 11.1* 11.3* 9.7 9.2  NEUTROABS  --  9.1*  --   --   --   HGB 8.5* 8.3* 9.0* 9.1* 8.2*  HCT 25.0* 24.3* 26.3* 26.5* 24.4*  MCV 91.9 91.7 91.8 92.6 93.4  PLT 101* 102* 120* 153 150    Cardiac Enzymes: Recent Labs  Lab 09/01/17 0310  TROPONINI 0.28*    BNP: Invalid input(s): POCBNP  CBG: Recent Labs  Lab 09/05/17 1555 09/05/17 1948 09/05/17 2324 09/06/17 0338 09/06/17 0748  GLUCAP 105* 111* 111* 112* 106*    Microbiology: Results for orders placed or performed during the hospital encounter of 08/22/17  Blood Culture (routine x 2)     Status: None   Collection Time: 08/22/17 12:52 PM  Result Value Ref Range Status   Specimen Description BLOOD RIGHT HAND  Final   Special Requests   Final    BOTTLES DRAWN AEROBIC AND ANAEROBIC Blood Culture adequate volume   Culture   Final    NO GROWTH 5 DAYS Performed at [facility], [address] [place], [place] [address]    Report Status 08/27/2017 FINAL  Final  Urine culture     Status: Abnormal   Collection Time: 08/22/17 12:52 PM  Result Value Ref Range Status   Specimen Description   Final    URINE, RANDOM Performed at [facility], [address], [place], [place] [address]    Special Requests   Final    NONE Performed at [facility], [address], [place], [place] [address]    Culture (A)  Final    >=100,000 COLONIES/mL KLEBSIELLA PNEUMONIAE 60,000 COLONIES/mL PROTEUS MIRABILIS    Report Status 08/24/2017 FINAL  Final    Organism ID, Bacteria KLEBSIELLA PNEUMONIAE (A)  Final   Organism ID, Bacteria PROTEUS MIRABILIS (A)  Final      Susceptibility   Klebsiella pneumoniae - MIC*    AMPICILLIN RESISTANT Resistant     CEFAZOLIN <=4 SENSITIVE Sensitive     CEFTRIAXONE <=1 SENSITIVE Sensitive     CIPROFLOXACIN <=0.25 SENSITIVE Sensitive     GENTAMICIN <=1 SENSITIVE Sensitive     IMIPENEM <=0.25 SENSITIVE Sensitive     NITROFURANTOIN 64 INTERMEDIATE Intermediate     TRIMETH/SULFA <=20 SENSITIVE Sensitive     AMPICILLIN/SULBACTAM 4 SENSITIVE Sensitive     PIP/TAZO <=4 SENSITIVE Sensitive     Extended ESBL NEGATIVE Sensitive     * >=100,000 COLONIES/mL KLEBSIELLA PNEUMONIAE   Proteus mirabilis - MIC*    AMPICILLIN <=2 SENSITIVE Sensitive     CEFAZOLIN <=4 SENSITIVE Sensitive     CEFTRIAXONE <=1 SENSITIVE Sensitive     CIPROFLOXACIN <=0.25 SENSITIVE Sensitive     GENTAMICIN <=1 SENSITIVE Sensitive     IMIPENEM 1 SENSITIVE Sensitive     NITROFURANTOIN 128 RESISTANT Resistant     TRIMETH/SULFA <=20 SENSITIVE Sensitive     AMPICILLIN/SULBACTAM <=2 SENSITIVE Sensitive     PIP/TAZO <=4 SENSITIVE Sensitive     * 60,000 COLONIES/mL PROTEUS MIRABILIS  Blood Culture (routine x 2)     Status: None   Collection Time: 08/22/17 12:53 PM  Result Value Ref Range Status   Specimen Description BLOOD RIGHT ARM  Final   Special Requests   Final    BOTTLES DRAWN AEROBIC AND ANAEROBIC Blood Culture results may not be optimal due to an excessive volume of blood received in culture bottles   Culture   Final    NO GROWTH 5 DAYS Performed at [facility], [address] [place], [place] [address]    Report Status 08/27/2017 FINAL  Final  MRSA PCR Screening     Status: None   Collection Time: 08/22/17  9:18 PM  Result Value Ref Range Status   MRSA by PCR NEGATIVE NEGATIVE Final    Comment:        The GeneXpert MRSA Assay (FDA approved for NASAL specimens only), is one component of a comprehensive  MRSA  colonization surveillance program. It is not intended to diagnose MRSA infection nor to guide or monitor treatment for MRSA infections. Performed at [facility], [address] [place], [place] [address]   Culture, blood (Routine X 2) w Reflex to ID Panel     Status: None   Collection Time: 08/29/17  9:31 PM  Result Value Ref Range Status   Specimen Description BLOOD LEFT FOOT  Final   Special Requests   Final    BOTTLES DRAWN AEROBIC AND ANAEROBIC Blood Culture adequate volume   Culture   Final    NO GROWTH 5 DAYS Performed at [name], [address] [place], [place] [address]    Report Status 09/03/2017 FINAL  Final  Culture, blood (Routine X 2) w Reflex to ID Panel     Status: None   Collection Time: 08/29/17  9:50 PM  Result Value Ref Range Status   Specimen Description BLOOD RIGHT ANKLE  Final   Special Requests   Final    BOTTLES DRAWN AEROBIC AND ANAEROBIC Blood Culture adequate volume   Culture   Final    NO GROWTH 5 DAYS Performed at [facility], [address], [place], [place] [address]    Report Status 09/03/2017 FINAL  Final  C difficile quick scan w PCR reflex     Status: None   Collection Time: 09/02/17 12:31 PM  Result Value Ref Range Status   C Diff antigen NEGATIVE NEGATIVE Final   C Diff toxin NEGATIVE NEGATIVE Final   C Diff interpretation No C. difficile detected.  Final    Comment: Performed at [facility], [address] [place], [place] [address]  CULTURE, BLOOD (ROUTINE X 2) w Reflex to ID Panel     Status: None (Preliminary result)   Collection Time: 09/02/17  7:25 PM  Result Value Ref Range Status   Specimen Description BLOOD BLOOD RIGHT HAND  Final   Special Requests   Final    BOTTLES DRAWN AEROBIC AND ANAEROBIC Blood Culture adequate volume   Culture   Final    NO GROWTH 4 DAYS Performed at [facility], [address] [place], [place] [address]    Report Status PENDING   Incomplete  CULTURE, BLOOD (ROUTINE X 2) w Reflex to ID Panel     Status: None (Preliminary result)   Collection Time: 09/02/17  7:25 PM  Result Value Ref Range Status   Specimen Description BLOOD BLOOD RIGHT HAND  Final   Special Requests   Final    BOTTLES DRAWN AEROBIC AND ANAEROBIC Blood Culture results may not be optimal due to an inadequate volume of blood received in culture bottles   Culture   Final    NO GROWTH 4 DAYS Performed at [facility], [address], [place], [place] [address]    Report Status PENDING  Incomplete  Urine Culture     Status: Abnormal   Collection Time: 09/03/17  4:23 AM  Result Value Ref Range Status   Specimen Description   Final    URINE, RANDOM Performed at [facility], [address], [place], [place] [address]    Special Requests   Final    NONE Performed at [facility], [address], [place], [place] [address]    Culture (A)  Final    >=100,000 COLONIES/mL DIPHTHEROIDS(CORYNEBACTERIUM SPECIES) Standardized susceptibility testing for this organism is not available. Performed at [facility], [place] [address]  [place], [place] [address]    Report Status 09/04/2017 FINAL  Final    Coagulation Studies: No results for input(s): LABPROT, INR in the last 72 hours.  Urinalysis: No results for input(s): COLORURINE, LABSPEC, PHURINE, GLUCOSEU, HGBUR, BILIRUBINUR, KETONESUR, PROTEINUR, UROBILINOGEN, NITRITE, LEUKOCYTESUR in the last 72 hours.  Invalid input(s): APPERANCEUR    Imaging: Dg Abd 1 View  Result Date: 09/05/2017 CLINICAL DATA:  Ileus. EXAM: ABDOMEN - 1 VIEW COMPARISON:  08/31/2017 and chest x-ray 09/02/2017 FINDINGS: Nasogastric tube has tip over the stomach in the left upper quadrant. Bowel gas pattern is nonobstructive with air throughout the colon. No free peritoneal air. Findings suggesting small left pleural effusion. Left IJ central venous catheter tip unchanged. Right-sided PICC line  present. Degenerative change of the spine IMPRESSION: Nonobstructive bowel gas pattern. Tubes and lines as described with nasogastric tube tip over the stomach in the left upper quadrant. Electronically Signed   By: Marin Olp M.D.   On: 09/05/2017 15:58     Medications:   . amiodarone 30 mg/hr (09/06/17 0911)  . famotidine (PEPCID) IV     . B-complex with vitamin C  1 tablet Per Tube QHS  . chlorhexidine  15 mL Mouth Rinse BID  . Chlorhexidine Gluconate Cloth  6 each Topical Q0600  . feeding supplement (NEPRO CARB STEADY)  1,000 mL Per Tube Q24H  . feeding supplement (PRO-STAT SUGAR FREE 64)  30 mL Per Tube Daily  . free water  200 mL Per Tube Q8H  . heparin injection (subcutaneous)  5,000 Units Subcutaneous Q8H  . insulin aspart  0-15 Units Subcutaneous Q4H  . insulin aspart  3 Units Subcutaneous Q4H  . insulin glargine  10 Units Subcutaneous Daily  . letrozole  2.5 mg Oral Daily  . mouth rinse  15 mL Mouth Rinse q12n4p  . nystatin cream   Topical BID  . sodium chloride flush  10-40 mL Intracatheter Q12H   acetaminophen **OR** acetaminophen, bisacodyl, heparin lock flush, hydrALAZINE, ipratropium-albuterol, metoprolol tartrate, [DISCONTINUED] ondansetron **OR** ondansetron (ZOFRAN) IV, sodium chloride flush  Assessment/ Plan:  [name] is a 72 y.o. white female with diabetes mellitus type II, coronary artery disease, obstructive sleep apnea, history of breast cancer, was admitted on6/9/2019with fever, chills, tick bite.  1. Acute renal failure on chronic kidney disease stage III Baseline creatinine of 1.1, GFR of 49 on 06/01/17 2. Sepsis 3. Urinary tract infection-Klebsiella, Proteus 4. Elevated troponin/demand ischemia 5. Hypotension 6.  Thrombocytopenia 7.  Lower extremity Edema 8.  Acute respiratory failure, placed on ventilator 08/31/17, extubated 09/02/17.  Currently on 5L [place] 9. Ileus  Plan: Hemodialysis scheduled for today. Monitor daily for  dialysis need. If further dialysis required, will need a tunneled catheter.    LOS: 15 Antino Mayabb 6/24/201910:05 AM

## 2017-09-06 NOTE — Progress Notes (Signed)
Patient ID: Bonnie Holt, female   DOB: May 22, 1945, 72 y.o.   MRN: [id number] .   [employer] PROGRESS NOTE  Bonnie Holt MLY:[id number] DOB: 1945-11-13 DOA: 08/22/2017 PCP: Kirk Ruths, MD  HPI/Subjective: Patient more interactive today.  Quite a bit of NG tube output.  Feels like her abdomen is bloated.  States her breathing is okay.  Objective: Vitals:   09/06/17 1100 09/06/17 1200  BP: 136/61 (!) 160/50  Pulse: 86 87  Resp: 20 17  Temp:    SpO2: 97% 98%    Filed Weights   09/03/17 0317 09/04/17 0505 09/06/17 0432  Weight: 107.8 kg (237 lb 10.5 oz) 109.3 kg (240 lb 15.4 oz) 110.9 kg (244 lb 7.8 oz)    ROS: Review of Systems  Unable to perform ROS: Acuity of condition  Constitutional: Negative for chills and fever.  Respiratory: Negative for cough and shortness of breath.   Cardiovascular: Negative for chest pain.  Gastrointestinal: Positive for abdominal pain and nausea. Negative for diarrhea and vomiting.  Musculoskeletal: Negative for joint pain.  Neurological: Negative for dizziness.   Exam: Physical Exam  HENT:  Nose: No mucosal edema.  Mouth/Throat: No oropharyngeal exudate or posterior oropharyngeal edema.  Eyes: Pupils are equal, round, and reactive to light. Conjunctivae, EOM and lids are normal.  Neck: No JVD present. Carotid bruit is not present. No edema present. No thyroid mass and no thyromegaly present.  Cardiovascular: S1 normal and S2 normal. Exam reveals no gallop.  No murmur heard. Pulses:      Dorsalis pedis pulses are 2+ on the right side, and 2+ on the left side.  Respiratory: No respiratory distress. She has decreased breath sounds in the right lower field and the left lower field. She has no wheezes. She has no rhonchi. She has no rales.  GI: Soft. Bowel sounds are normal. There is no tenderness.  Musculoskeletal:       Right ankle: She exhibits swelling.       Left ankle: She exhibits swelling.  Lymphadenopathy:    She has  no cervical adenopathy.  Neurological: She is alert. No cranial nerve deficit.  Skin: Skin is warm. No rash noted. Nails show no clubbing.  Psychiatric: She has a normal mood and affect.      Data Reviewed: Basic Metabolic Panel: Recent Labs  Lab 09/01/17 0310 09/02/17 8127 09/03/17 0423 09/04/17 0527 09/04/17 1702 09/05/17 0650 09/06/17 0430  NA 142 136 137 138  --  140 140  K 3.4* 3.4* 3.2* 2.6* 3.6 3.9 3.7  CL 104 102 99* 101  --  101 98*  CO2 26 22 26 26   --  27 27  GLUCOSE 124* 159* 127* 137*  --  108* 118*  BUN 60* 53* 34* 22*  --  38* 51*  CREATININE 6.14* 5.25* 3.72* 2.74*  --  4.30* 5.69*  CALCIUM 7.8* 7.6* 8.1* 8.1*  --  8.6* 8.3*  MG  --   --  1.9  --   --   --   --   PHOS 3.9  --  4.1  4.2 3.7  --  5.8* 7.8*   Liver Function Tests: Recent Labs  Lab 08/31/17 0400 09/01/17 0310 09/03/17 0423 09/04/17 0527 09/05/17 0650 09/06/17 0430  AST 48* 40  --   --   --   --   ALT 11* 8*  --   --   --   --   ALKPHOS 112 110  --   --   --   --  BILITOT 0.8 0.8  --   --   --   --   PROT 6.3* 6.7  --   --   --   --   ALBUMIN 2.8* 2.9* 2.3* 2.4* 2.6* 2.5*   CBC: Recent Labs  Lab 09/02/17 0435 09/03/17 0423 09/04/17 0527 09/05/17 0650 09/06/17 0431 09/06/17 1017  WBC 13.7* 11.1* 11.3* 9.7 9.2  --   NEUTROABS  --  9.1*  --   --   --   --   HGB 8.5* 8.3* 9.0* 9.1* 8.2* 7.8*  HCT 25.0* 24.3* 26.3* 26.5* 24.4* 23.5*  MCV 91.9 91.7 91.8 92.6 93.4  --   PLT 101* 102* 120* 153 150  --    Cardiac Enzymes: Recent Labs  Lab 09/01/17 0310  TROPONINI 0.28*    CBG: Recent Labs  Lab 09/05/17 1948 09/05/17 2324 09/06/17 0338 09/06/17 0748 09/06/17 1154  GLUCAP 111* 111* 112* 106* 106*    Recent Results (from the past 240 hour(s))  Culture, blood (Routine X 2) w Reflex to ID Panel     Status: None   Collection Time: 08/29/17  9:31 PM  Result Value Ref Range Status   Specimen Description BLOOD LEFT FOOT  Final   Special Requests   Final    BOTTLES DRAWN  AEROBIC AND ANAEROBIC Blood Culture adequate volume   Culture   Final    NO GROWTH 5 DAYS Performed at [facility], [address] [place], [place] [address]    Report Status 09/03/2017 FINAL  Final  Culture, blood (Routine X 2) w Reflex to ID Panel     Status: None   Collection Time: 08/29/17  9:50 PM  Result Value Ref Range Status   Specimen Description BLOOD RIGHT ANKLE  Final   Special Requests   Final    BOTTLES DRAWN AEROBIC AND ANAEROBIC Blood Culture adequate volume   Culture   Final    NO GROWTH 5 DAYS Performed at [facility], [address] [place], [place] [address]    Report Status 09/03/2017 FINAL  Final  C difficile quick scan w PCR reflex     Status: None   Collection Time: 09/02/17 12:31 PM  Result Value Ref Range Status   C Diff antigen NEGATIVE NEGATIVE Final   C Diff toxin NEGATIVE NEGATIVE Final   C Diff interpretation No C. difficile detected.  Final    Comment: Performed at [facility], [address] [place], [place] [address]  CULTURE, BLOOD (ROUTINE X 2) w Reflex to ID Panel     Status: None (Preliminary result)   Collection Time: 09/02/17  7:25 PM  Result Value Ref Range Status   Specimen Description BLOOD BLOOD RIGHT HAND  Final   Special Requests   Final    BOTTLES DRAWN AEROBIC AND ANAEROBIC Blood Culture adequate volume   Culture   Final    NO GROWTH 4 DAYS Performed at [facility], [address], [place], [place] [address]    Report Status PENDING  Incomplete  CULTURE, BLOOD (ROUTINE X 2) w Reflex to ID Panel     Status: None (Preliminary result)   Collection Time: 09/02/17  7:25 PM  Result Value Ref Range Status   Specimen Description BLOOD BLOOD RIGHT HAND  Final   Special Requests   Final    BOTTLES DRAWN AEROBIC AND ANAEROBIC Blood Culture results may not be optimal due to an inadequate volume of blood received in culture bottles  Culture   Final    NO GROWTH 4 DAYS Performed at [facility], [address] [place], [place] [address]    Report Status PENDING  Incomplete  Urine Culture     Status: Abnormal   Collection Time: 09/03/17  4:23 AM  Result Value Ref Range Status   Specimen Description   Final    URINE, RANDOM Performed at [facility], [address] [place], [place] [address]    Special Requests   Final    NONE Performed at [facility], [address] [place], [place] [address]    Culture (A)  Final    >=100,000 COLONIES/mL DIPHTHEROIDS(CORYNEBACTERIUM SPECIES) Standardized susceptibility testing for this organism is not available. Performed at [facility], [place] [address], [place], [place] [address]    Report Status 09/04/2017 FINAL  Final      Scheduled Meds: . B-complex with vitamin C  1 tablet Per Tube QHS  . bisacodyl  10 mg Rectal Once  . chlorhexidine  15 mL Mouth Rinse BID  . Chlorhexidine Gluconate Cloth  6 each Topical Q0600  . feeding supplement (NEPRO CARB STEADY)  1,000 mL Per Tube Q24H  . feeding supplement (PRO-STAT SUGAR FREE 64)  30 mL Per Tube Daily  . heparin injection (subcutaneous)  5,000 Units Subcutaneous Q8H  . insulin aspart  0-15 Units Subcutaneous Q4H  . insulin aspart  3 Units Subcutaneous Q4H  . insulin glargine  10 Units Subcutaneous Daily  . letrozole  2.5 mg Oral Daily  . mouth rinse  15 mL Mouth Rinse q12n4p  . nystatin cream   Topical BID  . sodium chloride flush  10-40 mL Intracatheter Q12H   Continuous Infusions: . amiodarone 30 mg/hr (09/06/17 1100)  . dextrose 5 % and 0.45% NaCl 35 mL/hr (09/06/17 1235)  . erythromycin 500 mg (09/06/17 1526)  . famotidine (PEPCID) IV Stopped (09/06/17 1305)    Assessment/Plan:  1. Sepsis with multiorgan failure and initial hypotension.  Likely combination of urinary tract infection versus tickborne disease.  Finished antibiotics. 2. Acute hypoxic respiratory failure with fluid overload.  Patient extubated on Thursday.  Taper down  on nasal cannula. 3. Acute kidney injury.  ATN from sepsis and hypotension.  Hemodialysis today as per nephrology.    Creatinine worsened today 5.69.  Making urine.  Foley had to be placed for urinary retention. 4. ileus with  NG tube to suction.   5. Elevated troponin demand ischemia in the setting of sepsis. 6. Atrial fibrillation with rapid ventricular response.  On amiodarone drip.  Currently in normal sinus rhythm. 7. Thrombocytopenia secondary to sepsis.  last platelet count 150..  8. History of breast cancer on Femara 9. Morbid obesity weight loss needed 10. History of sleep apnea on BiPAP at home  11. type 2 diabetes on sliding scale 12. Nutrition.  Patient on diet and also receiving tube feeds. 13. Weakness.  Need repeated physical therapy evaluation.  Code Status:     Code Status Orders  (From admission, onward)        Start     Ordered   08/22/17 1612  Full code  Continuous     08/22/17 1611    Code Status History    This patient has a current code status but no historical code status.     Family Communication: Husband at the bedside Disposition Plan: To be determined based on clinical course  Consultants:  Critical care specialist  Nephrology  Time  spent: 26 minutes  [place]

## 2017-09-06 NOTE — Progress Notes (Signed)
Pre HD Assessment    09/06/17 1700  Neurological  Level of Consciousness Alert  Orientation Level Oriented to person;Oriented to place;Oriented to situation;Disoriented to time  Respiratory  Respiratory Pattern Regular;Unlabored  Chest Assessment Chest expansion symmetrical  Bilateral Breath Sounds Clear  Cough None  Cardiac  Pulse Regular  Heart Sounds S1, S2  Vascular  R Radial Pulse +2  L Radial Pulse +2  Edema Generalized  Generalized Edema +1  Psychosocial  Psychosocial (WDL) WDL  Patient Behaviors Cooperative;Calm

## 2017-09-06 NOTE — Care Management (Signed)
PPD requested from dialysis liaison Estill Bamberg.  RNCM spoke with RN and he will check status/obtain.

## 2017-09-06 NOTE — Progress Notes (Signed)
HD Tx completed    09/06/17 2000  Vital Signs  Temp 98.5 F (36.9 C)  Temp Source Oral  Pulse Rate 80  Pulse Rate Source Monitor  Resp 19  BP (!) 143/60  BP Location Right Leg  BP Method Automatic  Patient Position (if appropriate) Lying  Oxygen Therapy  SpO2 98 %  O2 Device Nasal Cannula  Pulse Oximetry Type Continuous  During Hemodialysis Assessment  HD Safety Checks Performed Yes  Dialysis Fluid Bolus Normal Saline  Bolus Amount (mL) 250 mL  Intra-Hemodialysis Comments Tx completed;Tolerated well

## 2017-09-06 NOTE — Progress Notes (Signed)
   09/06/17 1035  Clinical Encounter Type  Visited With Patient and family together  Visit Type Follow-up  Spiritual Encounters  Spiritual Needs Emotional;Prayer   Chaplain checked in with patient and spouse.  Patient awake and engaged.  Spouse told patient about chaplain's ongoing involvement and support.  Patient reached out for chaplain's hand and asked for her name again.  Chaplain told patient her her name.  Patient expressed desire to go home, said that she's ready to 'be done here.'  Spouse expressed how difficult it was to hear that, knowing that he can't meet her wishes.  Patient requested prayer.  Chaplain asked what patient would like to pray for.  Patient responded that she wanted prayers for 'her little dog' Tobey, to go home, and for fresh cherries; spouse requested prayer for patient's health and healing.  Chaplain engaged patient and spouse in what she liked to do when she was at home.  Patient misses her bed and the outdoor living area where she and spouse spent their time.  Patient expressed feeling tired, released chaplain's hand.  Chaplain excused herself and patient/spouse expressed openness to ongoing chaplain visits.  Chaplain also encouraged them to page as needed.

## 2017-09-06 NOTE — Progress Notes (Signed)
HD Tx started, w/o complication, pt denies complaints.  09/06/17 1700  Hand-Off documentation  Report given to (Full Name) Beatris Ship, RN   Report received from (Full Name) Wilhemina Cash, RN   Vital Signs  Temp 98.5 F (36.9 C)  Temp Source Oral  Pulse Rate 86  Pulse Rate Source Monitor  Resp (!) 22  BP (!) 146/72  BP Location Right Leg  BP Method Automatic  Patient Position (if appropriate) Lying  Oxygen Therapy  SpO2 96 %  O2 Device Nasal Cannula  Pain Assessment  Pain Scale 0-10  Pain Score 0  Dialysis Weight  Weight 112.9 kg (248 lb 14.4 oz)  Type of Weight Pre-Dialysis  Time-Out for Hemodialysis  What Procedure? HD   Pt Identifiers(min of two) First/Last Name;MRN/Account#  Correct Site? Yes  Correct Side? Yes  Correct Procedure? Yes  Consents Verified? Yes  Rad Studies Available? N/A  Safety Precautions Reviewed? Yes  Engineer, civil (consulting) Number [phone]  Station Number  (Bedside )  UF/Alarm Test Passed  Conductivity: Meter 14  Conductivity: Machine  14  pH 7.2  Reverse Osmosis 37024  Normal Saline Lot Number [id number]  Dialyzer Lot Number [id number]  Disposable Set Lot Number [id number]  Machine Temperature 98.6 F (37 C)  Musician and Audible Yes  Pre Treatment Patient Checks  Vascular access used during treatment Catheter  Hepatitis B Surface Antigen Results Negative  Date Hepatitis B Surface Antigen Drawn 08/29/17  Hepatitis B Surface Antibody 10 (08/29/2017)  Date Hepatitis B Surface Antibody Drawn 08/29/17  Hemodialysis Consent Verified Yes  Hemodialysis Standing Orders Initiated Yes  ECG (Telemetry) Monitor On Yes  Prime Ordered Normal Saline  Length of  DialysisTreatment -hour(s) 3.5 Hour(s)  Dialysis Treatment Comments Na 140  Dialyzer Optiflux 180 NR  Dialysate 3K, 2.5 Ca  Dialysis Anticoagulant None  Dialysate Flow Ordered  (600)  Blood Flow Rate Ordered 400 mL/min  Ultrafiltration Goal 1.5 Liters  Dialysis Blood Pressure  Support Ordered Normal Saline  During Hemodialysis Assessment  Blood Flow Rate (mL/min) 400 mL/min  Arterial Pressure (mmHg) -140 mmHg  Venous Pressure (mmHg) -130 mmHg  Transmembrane Pressure (mmHg) 70 mmHg  Ultrafiltration Rate (mL/min) 840 mL/min  Dialysate Flow Rate (mL/min) 800 ml/min  Conductivity: Machine  14.1  HD Safety Checks Performed Yes  Dialysis Fluid Bolus Normal Saline  Bolus Amount (mL) 250 mL  Intra-Hemodialysis Comments Tx initiated  Education / Care Plan  Dialysis Education Provided  (Pt has some confusion )  Hemodialysis Catheter Left Internal jugular Triple-lumen  Placement Date/Time: 08/29/17 1400   Time Out: Correct patient;Correct site;Correct procedure  Maximum sterile barrier precautions: Hand hygiene;Sterile gloves;Cap;Large sterile sheet;Mask;Sterile gown  Site Prep: Chlorhexidine  Local Anesthetic: Inje...  Site Condition No complications  Blue Lumen Status Flushed  Red Lumen Status Flushed  Purple Lumen Status Capped (Central line)  Dressing Type Occlusive;Biopatch  Dressing Status Clean;Dry;Intact  Dressing Change Due 09/12/17

## 2017-09-06 NOTE — Progress Notes (Signed)
   09/05/17 1803  PPD Results  Does patient have an induration at the injection site? No  Induration(mm) 0 mm

## 2017-09-06 NOTE — Progress Notes (Signed)
Occupational Therapy Treatment Patient Details Name: Bonnie Holt MRN: [id number] DOB: August 20, 1945 Today's Date: 09/06/2017    History of present illness Pt admitted to hospital on 08/22/17 and has been diagnosed with sepsis secondary to UTI, acute renal failure, and acute respiratory failure. Pts hospital stay has been complicated by fall on 1/88/41 intubation on 08/31/17 and extubation on 09/02/17. Pt has a past medical history that includes breast cancer, coronary artery disease, and HTN. Pt currently has temporary IJ catheter for HD, clearance from MD required for mobility.   OT comments  Pt seen for OT tx this date, focus on BUE and BLE there-ex to increase UB/LB strength and activity tolerance for functional mobility and ADL tasks. Pt tolerated well, able to follow all commands. Expressed mild concern that she could not recall events leading to this admission. Pt eager to work with therapist and spouse expressed interest in continuing BUE/BLE exercises outside of therapy sessions. Pt/spouse educated in there-ex the pt could perform a few times per day. Pt/spouse verbalized understanding, pt able to demo understanding. VSS throughout session. Further mobility attempts deferred 2:2 pt remains on amiodarone drip. Will continue to progress.     Follow Up Recommendations  SNF    Equipment Recommendations       Recommendations for Other Services      Precautions / Restrictions Precautions Precautions: Fall;Other (comment) Precaution Comments: L IJ HD cath, rectal tube Restrictions Weight Bearing Restrictions: No       Mobility Bed Mobility            Transfers                      Balance                                           ADL either performed or assessed with clinical judgement   ADL Overall ADL's : Needs assistance/impaired                                       General ADL Comments: pt requires mod assist for UB  bathing/dressing and max assist for LB bathing/dressing     Vision Patient Visual Report: No change from baseline     Perception     Praxis      Cognition Arousal/Alertness: Awake/alert Behavior During Therapy: WFL for tasks assessed/performed Overall Cognitive Status: Impaired/Different from baseline                                 General Comments: pt unable to recall recent events leading to this hospitalization which she states is "a little upsetting", follows all commands        Exercises Other Exercises Other Exercises: Pt instructed in BUE and BLE ther-ex including shoulder flexion arm raises (concentric, eccentric, and isometric), bicep curls, SLR, ankle pumps and heel slides to increase pt's strength and activity tolerance for mobility and ADL tasks   Shoulder Instructions       General Comments      Pertinent Vitals/ Pain       Pain Assessment: No/denies pain Pain Intervention(s): Monitored during session  Home Living  Prior Functioning/Environment              Frequency  Min 2X/week        Progress Toward Goals  OT Goals(current goals can now be found in the care plan section)  Progress towards OT goals: Progressing toward goals  Acute Rehab OT Goals Patient Stated Goal: get better and go home OT Goal Formulation: With patient/family Time For Goal Achievement: 09/18/17 Potential to Achieve Goals: Good  Plan Discharge plan remains appropriate;Frequency remains appropriate    Co-evaluation                 AM-PAC PT "6 Clicks" Daily Activity     Outcome Measure   Help from another person eating meals?: A Lot Help from another person taking care of personal grooming?: A Little Help from another person toileting, which includes using toliet, bedpan, or urinal?: Total Help from another person bathing (including washing, rinsing, drying)?: A Lot Help from another  person to put on and taking off regular upper body clothing?: A Lot Help from another person to put on and taking off regular lower body clothing?: A Lot 6 Click Score: 12    End of Session    OT Visit Diagnosis: Other abnormalities of gait and mobility (R26.89);Other symptoms and signs involving cognitive function   Activity Tolerance Patient tolerated treatment well   Patient Left in bed;with call bell/phone within reach;with bed alarm set;with family/visitor present   Nurse Communication          Time: 2111-5520 OT Time Calculation (min): 29 min  Charges: OT General Charges $OT Visit: 1 Visit OT Treatments $Therapeutic Exercise: 23-37 mins   Jeni Salles, MPH, MS, OTR/L ascom [phone] 09/06/17, 9:43 AM

## 2017-09-07 ENCOUNTER — Encounter: Payer: Self-pay | Admitting: Anesthesiology

## 2017-09-07 ENCOUNTER — Encounter
Admission: EM | Disposition: A | Discharge status: Skilled Nursing Facility | Payer: Self-pay | Source: Home / Self Care | Attending: Specialist

## 2017-09-07 ENCOUNTER — Inpatient Hospital Stay: Payer: PPO | Admitting: Anesthesiology

## 2017-09-07 ENCOUNTER — Inpatient Hospital Stay: Payer: PPO

## 2017-09-07 DIAGNOSIS — K254 Chronic or unspecified gastric ulcer with hemorrhage: Secondary | ICD-10-CM

## 2017-09-07 DIAGNOSIS — K250 Acute gastric ulcer with hemorrhage: Secondary | ICD-10-CM

## 2017-09-07 DIAGNOSIS — K2900 Acute gastritis without bleeding: Secondary | ICD-10-CM

## 2017-09-07 HISTORY — PX: ESOPHAGOGASTRODUODENOSCOPY (EGD) WITH PROPOFOL: SHX5813

## 2017-09-07 LAB — CBC WITH DIFFERENTIAL/PLATELET
BASOS PCT: 1 %
Basophils Absolute: 0 10*3/uL (ref 0–0.1)
EOS PCT: 2 %
Eosinophils Absolute: 0.1 10*3/uL (ref 0–0.7)
HEMATOCRIT: 17.8 % — AB (ref 35.0–47.0)
Hemoglobin: 5.9 g/dL — ABNORMAL LOW (ref 12.0–16.0)
Lymphocytes Relative: 10 %
Lymphs Abs: 0.6 10*3/uL — ABNORMAL LOW (ref 1.0–3.6)
MCH: 30.9 pg (ref 26.0–34.0)
MCHC: 33 g/dL (ref 32.0–36.0)
MCV: 93.7 fL (ref 80.0–100.0)
MONO ABS: 0.8 10*3/uL (ref 0.2–0.9)
MONOS PCT: 14 %
Neutro Abs: 4.3 10*3/uL (ref 1.4–6.5)
Neutrophils Relative %: 73 %
PLATELETS: 125 10*3/uL — AB (ref 150–440)
RBC: 1.9 MIL/uL — ABNORMAL LOW (ref 3.80–5.20)
RDW: 16.1 % — AB (ref 11.5–14.5)
WBC: 5.8 10*3/uL (ref 3.6–11.0)

## 2017-09-07 LAB — PROTEIN ELECTRO, RANDOM URINE
ALPHA-1-GLOBULIN, U: 5.2 %
ALPHA-2-GLOBULIN, U: 14.4 %
Albumin ELP, Urine: 34.4 %
BETA GLOBULIN, U: 14 %
GAMMA GLOBULIN, U: 32 %
M Component, Ur: 6.1 % — ABNORMAL HIGH
TOTAL PROTEIN, URINE-UPE24: 178.4 mg/dL

## 2017-09-07 LAB — RENAL FUNCTION PANEL
ANION GAP: 12 (ref 5–15)
Albumin: 2.3 g/dL — ABNORMAL LOW (ref 3.5–5.0)
BUN: 34 mg/dL — ABNORMAL HIGH (ref 8–23)
CALCIUM: 8.2 mg/dL — AB (ref 8.9–10.3)
CO2: 28 mmol/L (ref 22–32)
CREATININE: 4.37 mg/dL — AB (ref 0.44–1.00)
Chloride: 100 mmol/L (ref 98–111)
GFR, EST AFRICAN AMERICAN: 11 mL/min — AB (ref >60)
GFR, EST NON AFRICAN AMERICAN: 9 mL/min — AB (ref >60)
Glucose, Bld: 123 mg/dL — ABNORMAL HIGH (ref 70–99)
PHOSPHORUS: 5.9 mg/dL — AB (ref 2.5–4.6)
Potassium: 3.3 mmol/L — ABNORMAL LOW (ref 3.5–5.1)
SODIUM: 140 mmol/L (ref 135–145)

## 2017-09-07 LAB — CULTURE, BLOOD (ROUTINE X 2)
Culture: NO GROWTH
Culture: NO GROWTH
SPECIAL REQUESTS: ADEQUATE

## 2017-09-07 LAB — GLUCOSE, CAPILLARY
GLUCOSE-CAPILLARY: 137 mg/dL — AB (ref 70–99)
Glucose-Capillary: 117 mg/dL — ABNORMAL HIGH (ref 70–99)
Glucose-Capillary: 114 mg/dL — ABNORMAL HIGH (ref 70–99)
Glucose-Capillary: 134 mg/dL — ABNORMAL HIGH (ref 70–99)
Glucose-Capillary: 109 mg/dL — ABNORMAL HIGH (ref 70–99)

## 2017-09-07 LAB — PREPARE RBC (CROSSMATCH)

## 2017-09-07 LAB — HEMOGLOBIN AND HEMATOCRIT, BLOOD
HCT: 28.7 % — ABNORMAL LOW (ref 35.0–47.0)
Hemoglobin: 9.9 g/dL — ABNORMAL LOW (ref 12.0–16.0)

## 2017-09-07 SURGERY — ESOPHAGOGASTRODUODENOSCOPY (EGD) WITH PROPOFOL
Anesthesia: General

## 2017-09-07 MED ORDER — PROPOFOL 10 MG/ML IV BOLUS
INTRAVENOUS | Status: AC
  Filled 2017-09-07: qty 20

## 2017-09-07 MED ORDER — SUCCINYLCHOLINE CHLORIDE 20 MG/ML IJ SOLN
INTRAMUSCULAR | Status: DC | PRN
  Administered 2017-09-07: 100 mg via INTRAVENOUS

## 2017-09-07 MED ORDER — PROPOFOL 10 MG/ML IV BOLUS
INTRAVENOUS | Status: DC | PRN
  Administered 2017-09-07: 100 mg via INTRAVENOUS

## 2017-09-07 MED ORDER — ALUM & MAG HYDROXIDE-SIMETH 200-200-20 MG/5ML PO SUSP
15.0000 mL | Freq: Once | ORAL | Status: DC
  Filled 2017-09-07: qty 30

## 2017-09-07 MED ORDER — PANTOPRAZOLE SODIUM 40 MG IV SOLR: 40.0000 mg | Freq: Two times a day (BID) | INTRAVENOUS | Status: DC

## 2017-09-07 MED ORDER — SODIUM CHLORIDE 0.9 % IV SOLN
8.0000 mg/h | INTRAVENOUS | Status: DC
  Administered 2017-09-07 – 2017-09-08 (×3): 8 mg/h via INTRAVENOUS
  Filled 2017-09-07 (×3): qty 80

## 2017-09-07 MED ORDER — ONDANSETRON HCL 4 MG/2ML IJ SOLN
INTRAMUSCULAR | Status: AC
  Filled 2017-09-07: qty 2

## 2017-09-07 MED ORDER — LIDOCAINE HCL (PF) 2 % IJ SOLN
INTRAMUSCULAR | Status: AC
  Filled 2017-09-07: qty 10

## 2017-09-07 MED ORDER — RENA-VITE PO TABS
1.0000 | ORAL_TABLET | Freq: Every day | ORAL | Status: DC
  Administered 2017-09-08 – 2017-09-14 (×7): 1 via ORAL
  Filled 2017-09-07 (×8): qty 1

## 2017-09-07 MED ORDER — POTASSIUM CHLORIDE 10 MEQ/100ML IV SOLN
10.0000 meq | Freq: Once | INTRAVENOUS | Status: AC
  Administered 2017-09-07: 10 meq via INTRAVENOUS
  Filled 2017-09-07: qty 100

## 2017-09-07 MED ORDER — SUCCINYLCHOLINE CHLORIDE 20 MG/ML IJ SOLN
INTRAMUSCULAR | Status: AC
Start: 2017-09-07 — End: ?
  Filled 2017-09-07: qty 1

## 2017-09-07 MED ORDER — PANTOPRAZOLE SODIUM 40 MG IV SOLR
80.0000 mg | Freq: Once | INTRAVENOUS | Status: AC
  Administered 2017-09-07: 80 mg via INTRAVENOUS
  Filled 2017-09-07: qty 80

## 2017-09-07 MED ORDER — SODIUM CHLORIDE 0.9% IV SOLUTION
Freq: Once | INTRAVENOUS | Status: AC
  Administered 2017-09-07: 12:00:00 via INTRAVENOUS
  Administered 2017-09-07: 1000 mL via INTRAVENOUS

## 2017-09-07 NOTE — Care Management (Addendum)
Dialysis schedule pending. LTAC screening requested from both [facility]) and [employer] [facility]).  RNCM will follow. Update 11A: [facility] is interested; Administrator, [employer] has declined due to dialysis; Patient doesn't appear stable for LTAC transfer.

## 2017-09-07 NOTE — Op Note (Signed)
[facility] Gastroenterology Patient Name: Bonnie Holt Procedure Date: 09/07/2017 11:58 AM MRN: [id number] Account #: [id number] Date of Birth: 1945-11-06 Admit Type: Inpatient Age: 72 Room: [facility] ENDO ROOM 4 Gender: Female Note Status: Finalized Procedure:            Upper GI endoscopy Indications:          Acute post hemorrhagic anemia Providers:            Lucilla Lame MD, MD Referring MD:         Ocie Cornfield. Ouida Sills MD, MD (Referring MD) Medicines:            Propofol per Anesthesia Complications:        No immediate complications. Procedure:            Pre-Anesthesia Assessment:                       - Prior to the procedure, a History and Physical was                        performed, and patient medications and allergies were                        reviewed. The patient's tolerance of previous                        anesthesia was also reviewed. The risks and benefits of                        the procedure and the sedation options and risks were                        discussed with the patient. All questions were                        answered, and informed consent was obtained. Prior                        Anticoagulants: The patient has taken no previous                        anticoagulant or antiplatelet agents. ASA Grade                        Assessment: IV - A patient with severe systemic disease                        that is a constant threat to life. After reviewing the                        risks and benefits, the patient was deemed in                        satisfactory condition to undergo the procedure.                       After obtaining informed consent, the endoscope was                        passed under direct vision. Throughout the procedure,  the patient's blood pressure, pulse, and oxygen                        saturations were monitored continuously. The Endoscope                        was introduced  through the mouth, and advanced to the                        second part of duodenum. The upper GI endoscopy was                        accomplished without difficulty. The patient tolerated                        the procedure well. Findings:      The examined esophagus was normal.      Two oozing superficial gastric ulcers with a visible vessel were found       in the gastric body. To stop active bleeding, two hemostatic clips were       successfully placed (MR conditional). There was no bleeding at the end       of the procedure.      The examined duodenum was normal. Impression:           - Normal esophagus.                       - Oozing gastric ulcers with a visible vessel. Clips                        (MR conditional) were placed.                       - Normal examined duodenum.                       - No specimens collected.                       - The ulcerations appear to be the result of NG tube                        trauma.                       There so no old blood seen in the small bowel. Recommendation:       - Return patient to hospital ward for ongoing care.                       - Use a proton pump inhibitor IV daily. Procedure Code(s):    --- Professional ---                       [id number], Esophagogastroduodenoscopy, flexible, transoral;                        with control of bleeding, any method Diagnosis Code(s):    --- Professional ---                       D62, Acute posthemorrhagic anemia  K25.4, Chronic or unspecified gastric ulcer with                        hemorrhage CPT copyright 2017 American Medical Association. All rights reserved. The codes documented in this report are preliminary and upon coder review may  be revised to meet current compliance requirements. Lucilla Lame MD, MD 09/07/2017 12:24:50 PM This report has been signed electronically. Number of Addenda: 0 Note Initiated On: 09/07/2017 11:58 AM      [facility]

## 2017-09-07 NOTE — Progress Notes (Signed)
Patient followed all commands. Denies pain. Patient noted to have significant drop in hemoglobin without hemodynamic compromise. ? Coffee grounds from NGT.  Extubated 09/02/17  Vitals:   09/07/17 0412 09/07/17 0416 09/07/17 0500 09/07/17 0600  BP: (!) 175/68  (!) 163/69 (!) 166/61  Pulse:   86 87  Resp:   16 20  Temp:      TempSrc:      SpO2:   97% 99%  Weight:  232 lb 2.3 oz (105.3 kg)    Height:       Gen: comfortable in bed, seen on BIPA HEENT: NCAT, sclera white, dry oral cavity Neck: JVP not visualized Lungs: breath sounds full anteriorly without wheezes or other adventitious sounds Cardiovascular: RRR, no murmurs Abdomen: Obese, soft, nontender, inaudible BS Ext: without clubbing, cyanosis,1+ edema upper extremities Neuro: Awake and appropriate Skin: Limited exam, no lesions noted, groin yeast      BMP Latest Ref Rng & Units 09/07/2017 09/06/2017 09/05/2017  Glucose 70 - 99 mg/dL 123(H) 118(H) 108(H)  BUN 8 - 23 mg/dL 34(H) 51(H) 38(H)  Creatinine 0.44 - 1.00 mg/dL 4.37(H) 5.69(H) 4.30(H)  Sodium 135 - 145 mmol/L 140 140 140  Potassium 3.5 - 5.1 mmol/L 3.3(L) 3.7 3.9  Chloride 98 - 111 mmol/L 100 98(L) 101  CO2 22 - 32 mmol/L _0 Calcium 8.9 - 10.3 mg/dL 8.2(L) 8.3(L) 8.6(L)    CBC Latest Ref Rng & Units 09/07/2017 09/06/2017 09/06/2017  WBC 3.6 - 11.0 K/uL 5.8 - -  Hemoglobin 12.0 - 16.0 g/dL 5.9(L) 7.7(L) 7.8(L)  Hematocrit 35.0 - 47.0 % 17.8(L) 23.3(L) 23.5(L)  Platelets 150 - 440 K/uL 125(L) - -    Hepatic Function Latest Ref Rng & Units 09/07/2017 09/06/2017 09/05/2017  Total Protein 6.5 - 8.1 g/dL - - -  Albumin 3.5 - 5.0 g/dL 2.3(L) 2.5(L) 2.6(L)  AST 15 - 41 U/L - - -  ALT 14 - 54 U/L - - -  Alk Phosphatase 38 - 126 U/L - - -  Total Bilirubin 0.3 - 1.2 mg/dL - - -   Results for orders placed or performed during the hospital encounter of 08/22/17  Blood Culture (routine x 2)     Status: None   Collection Time: 08/22/17 12:52 PM  Result Value Ref Range  Status   Specimen Description BLOOD RIGHT HAND  Final   Special Requests   Final    BOTTLES DRAWN AEROBIC AND ANAEROBIC Blood Culture adequate volume   Culture   Final    NO GROWTH 5 DAYS Performed at [facility], [address], [place], [place] [address]    Report Status 08/27/2017 FINAL  Final  Urine culture     Status: Abnormal   Collection Time: 08/22/17 12:52 PM  Result Value Ref Range Status   Specimen Description   Final    URINE, RANDOM Performed at [facility], [address], [place], [place] [address]    Special Requests   Final    NONE Performed at [facility], [address], [place], [place] [address]    Culture (A)  Final    >=100,000 COLONIES/mL KLEBSIELLA PNEUMONIAE 60,000 COLONIES/mL PROTEUS MIRABILIS    Report Status 08/24/2017 FINAL  Final   Organism ID, Bacteria KLEBSIELLA PNEUMONIAE (A)  Final   Organism ID, Bacteria PROTEUS MIRABILIS (A)  Final      Susceptibility   Klebsiella pneumoniae - MIC*    AMPICILLIN RESISTANT Resistant     CEFAZOLIN <=4  SENSITIVE Sensitive     CEFTRIAXONE <=1 SENSITIVE Sensitive     CIPROFLOXACIN <=0.25 SENSITIVE Sensitive     GENTAMICIN <=1 SENSITIVE Sensitive     IMIPENEM <=0.25 SENSITIVE Sensitive     NITROFURANTOIN 64 INTERMEDIATE Intermediate     TRIMETH/SULFA <=20 SENSITIVE Sensitive     AMPICILLIN/SULBACTAM 4 SENSITIVE Sensitive     PIP/TAZO <=4 SENSITIVE Sensitive     Extended ESBL NEGATIVE Sensitive     * >=100,000 COLONIES/mL KLEBSIELLA PNEUMONIAE   Proteus mirabilis - MIC*    AMPICILLIN <=2 SENSITIVE Sensitive     CEFAZOLIN <=4 SENSITIVE Sensitive     CEFTRIAXONE <=1 SENSITIVE Sensitive     CIPROFLOXACIN <=0.25 SENSITIVE Sensitive     GENTAMICIN <=1 SENSITIVE Sensitive     IMIPENEM 1 SENSITIVE Sensitive     NITROFURANTOIN 128 RESISTANT Resistant     TRIMETH/SULFA <=20 SENSITIVE Sensitive     AMPICILLIN/SULBACTAM <=2 SENSITIVE Sensitive     PIP/TAZO <=4 SENSITIVE Sensitive      * 60,000 COLONIES/mL PROTEUS MIRABILIS  Blood Culture (routine x 2)     Status: None   Collection Time: 08/22/17 12:53 PM  Result Value Ref Range Status   Specimen Description BLOOD RIGHT ARM  Final   Special Requests   Final    BOTTLES DRAWN AEROBIC AND ANAEROBIC Blood Culture results may not be optimal due to an excessive volume of blood received in culture bottles   Culture   Final    NO GROWTH 5 DAYS Performed at [facility], [address] [place], [place] [address]    Report Status 08/27/2017 FINAL  Final  MRSA PCR Screening     Status: None   Collection Time: 08/22/17  9:18 PM  Result Value Ref Range Status   MRSA by PCR NEGATIVE NEGATIVE Final    Comment:        The GeneXpert MRSA Assay (FDA approved for NASAL specimens only), is one component of a comprehensive MRSA colonization surveillance program. It is not intended to diagnose MRSA infection nor to guide or monitor treatment for MRSA infections. Performed at [facility], [address] [place], [place] [address]   Culture, blood (Routine X 2) w Reflex to ID Panel     Status: None   Collection Time: 08/29/17  9:31 PM  Result Value Ref Range Status   Specimen Description BLOOD LEFT FOOT  Final   Special Requests   Final    BOTTLES DRAWN AEROBIC AND ANAEROBIC Blood Culture adequate volume   Culture   Final    NO GROWTH 5 DAYS Performed at [facility], [address] [place], [place] [address]    Report Status 09/03/2017 FINAL  Final  Culture, blood (Routine X 2) w Reflex to ID Panel     Status: None   Collection Time: 08/29/17  9:50 PM  Result Value Ref Range Status   Specimen Description BLOOD RIGHT ANKLE  Final   Special Requests   Final    BOTTLES DRAWN AEROBIC AND ANAEROBIC Blood Culture adequate volume   Culture   Final    NO GROWTH 5 DAYS Performed at [facility], [address], [place],  [address]    Report Status 09/03/2017 FINAL  Final  C  difficile quick scan w PCR reflex     Status: None   Collection Time: 09/02/17 12:31 PM  Result Value Ref Range Status   C Diff antigen NEGATIVE NEGATIVE Final   C Diff toxin NEGATIVE  NEGATIVE Final   C Diff interpretation No C. difficile detected.  Final    Comment: Performed at [facility], [address] [place], [place] [address]  CULTURE, BLOOD (ROUTINE X 2) w Reflex to ID Panel     Status: None (Preliminary result)   Collection Time: 09/02/17  7:25 PM  Result Value Ref Range Status   Specimen Description BLOOD BLOOD RIGHT HAND  Final   Special Requests   Final    BOTTLES DRAWN AEROBIC AND ANAEROBIC Blood Culture adequate volume   Culture   Final    NO GROWTH 4 DAYS Performed at [facility], [address] [place], [place] [address]    Report Status PENDING  Incomplete  CULTURE, BLOOD (ROUTINE X 2) w Reflex to ID Panel     Status: None (Preliminary result)   Collection Time: 09/02/17  7:25 PM  Result Value Ref Range Status   Specimen Description BLOOD BLOOD RIGHT HAND  Final   Special Requests   Final    BOTTLES DRAWN AEROBIC AND ANAEROBIC Blood Culture results may not be optimal due to an inadequate volume of blood received in culture bottles   Culture   Final    NO GROWTH 4 DAYS Performed at [facility], [address], [place], [place] [address]    Report Status PENDING  Incomplete  Urine Culture     Status: Abnormal   Collection Time: 09/03/17  4:23 AM  Result Value Ref Range Status   Specimen Description   Final    URINE, RANDOM Performed at [facility], [address], [place], [place] [address]    Special Requests   Final    NONE Performed at [facility], [address], [place], [place] [address]    Culture (A)  Final    >=100,000 COLONIES/mL DIPHTHEROIDS(CORYNEBACTERIUM SPECIES) Standardized susceptibility testing for this organism is not available. Performed at [facility], [place] [address]  [place], [place] [address]    Report Status 09/04/2017 FINAL  Final    Anti-infectives (From admission, onward)   Start     Dose/Rate Route Frequency Ordered Stop   09/06/17 1400  erythromycin 500 mg in sodium chloride 0.9 % 100 mL IVPB  Status:  Discontinued     500 mg 100 mL/hr over 60 Minutes Intravenous Every 8 hours 09/06/17 1113 09/06/17 1550   08/25/17 1100  cefTRIAXone (ROCEPHIN) 1 g in sodium chloride 0.9 % 100 mL IVPB  Status:  Discontinued     1 g 200 mL/hr over 30 Minutes Intravenous Every 24 hours 08/25/17 1048 08/31/17 1538   08/25/17 1100  doxycycline (VIBRAMYCIN) 100 mg in sodium chloride 0.9 % 250 mL IVPB     100 mg 125 mL/hr over 120 Minutes Intravenous Every 12 hours 08/25/17 1057 09/04/17 0220   08/24/17 1230  ceFAZolin (ANCEF) IVPB 1 g/50 mL premix  Status:  Discontinued     1 g 100 mL/hr over 30 Minutes Intravenous Every 12 hours 08/24/17 1224 08/25/17 1048   08/22/17 2200  piperacillin-tazobactam (ZOSYN) IVPB 3.375 g  Status:  Discontinued     3.375 g 12.5 mL/hr over 240 Minutes Intravenous Every 8 hours 08/22/17 1509 08/24/17 1221   08/22/17 2200  vancomycin (VANCOCIN) IVPB 1000 mg/200 mL premix  Status:  Discontinued     1,000 mg 200 mL/hr over 60 Minutes Intravenous Every 24 hours 08/22/17 1509 08/23/17 2008   08/22/17 1400  piperacillin-tazobactam (ZOSYN) IVPB 3.375 g  3.375 g 100 mL/hr over 30 Minutes Intravenous  Once 08/22/17 1355 08/22/17 1431   08/22/17 1400  vancomycin (VANCOCIN) IVPB 1000 mg/200 mL premix     1,000 mg 200 mL/hr over 60 Minutes Intravenous  Once 08/22/17 1355 08/22/17 1510      CXR: NNF  MRI brain 06/19: No definite acute intracranial abnormality. 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  IMPRESSION: Severe sepsis of unclear etiology has resolved Recent tick bite - concern for ehrlichiosis (serologies negative) vs RSMF ID F/U. Has completed Doxycycline therapy Probable LLL PNA - treated Kelbsiella/proteus UTI - fully  treated Acute encephalopathy resolved Elevated trop I, likely demand ischemia - resolved Pulmonary edema, resolved Chronic LBBB PAF with RVR 6/19 > NSR after amiodarone initiated Acute renal failure on HD therapy as per Nephrology team DM 2, controlled Elevated transaminases, resolved Thrombocytopenia, improving Acute protein-calorie malnutrition Low TSH, low free T4 of unclear etiology. Free T3 pending Diarrhoea- cdiff negative Ileus- persistent Hypokalemia resolved Pyrexia- has resolved; repeated cultures negative Anemia of critical illness H/O breast Ca H/O CAD Acute anemia from GI bleed s/P EGD- NGT induced gastritis without active blood loss  PLAN/REC: Will need ischemia eval after recovers from this acute illness Monitor I/Os Correct electrolytes as indicated HD per Nephrology   completed 10 days of doxycycline  DVT px: SCDs Transfuse per usual guideline Will get PT/OT consult NGT removed, start liquid diet PPI   Family: Husband updated at bedside.  Thank you for allowing me the privileges to care for this patient.  Leroy Libman, MD PCCM service Pager [phone] 09/07/2017 6:11 AM

## 2017-09-07 NOTE — Progress Notes (Signed)
Intensive care nurse at bedside attending to multiple iv drips

## 2017-09-07 NOTE — Anesthesia Procedure Notes (Signed)
Procedure Name: Intubation Date/Time: 09/07/2017 12:15 PM Performed by: Marsh Dolly, CRNA Pre-anesthesia Checklist: Patient identified, Patient being monitored, Timeout performed, Emergency Drugs available and Suction available Patient Re-evaluated:Patient Re-evaluated prior to induction Oxygen Delivery Method: Circle system utilized Preoxygenation: Pre-oxygenation with 100% oxygen Induction Type: IV induction Ventilation: Mask ventilation without difficulty Laryngoscope Size: Mac, 3 and McGraph Grade View: Grade I Tube type: Oral Tube size: 7.5 mm Number of attempts: 1 Airway Equipment and Method: Stylet Placement Confirmation: ETT inserted through vocal cords under direct vision,  positive ETCO2 and breath sounds checked- equal and bilateral Secured at: 21 cm Tube secured with: Tape Dental Injury: Teeth and Oropharynx as per pre-operative assessment
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
## 2017-09-07 NOTE — Progress Notes (Signed)
OT Cancellation Note  Patient Details Name: Bonnie Holt MRN: [id number] DOB: Dec 22, 1945   Cancelled Treatment:    Reason Eval/Treat Not Completed: Medical issues which prohibited therapy. Per chart review, endoscopy performed today, Hgb 5.9, scheduled for 2 units PRBC. Per therapy guidelines, will hold treatment 2:2 low Hgb. Will re-attempt next date as pt is medically appropriate.   Jeni Salles, MPH, MS, OTR/L ascom [phone] 09/07/17, 5:00 PM

## 2017-09-07 NOTE — Progress Notes (Signed)
Dr Leslye Peer in to see pt

## 2017-09-07 NOTE — Progress Notes (Signed)
Patient ID: Bonnie Holt, female   DOB: [birth year], 72 y.o.   MRN: [id number] .   [employer] PROGRESS NOTE  NAVIA LINDAHL HYI:[id number] DOB: 06/18/1945 DOA: 08/22/2017 PCP: Kirk Ruths, MD  HPI/Subjective: Patient feeling tired after endoscopy.  She was seen by me in the PACU after endoscopy was done.  Answered a few questions but went back to sleep.  Objective: Vitals:   09/07/17 1257 09/07/17 1306  BP: (!) 154/66 (!) 147/60  Pulse: 95 88  Resp: 17 (!) 25  Temp:  97.9 F (36.6 C)  SpO2: 98% 97%    Filed Weights   09/06/17 1700 09/06/17 2005 09/07/17 0416  Weight: 112.9 kg (248 lb 14.4 oz) 110.7 kg (244 lb 0.8 oz) 105.3 kg (232 lb 2.3 oz)    ROS: Review of Systems  Unable to perform ROS: Acuity of condition  Respiratory: Negative for shortness of breath.   Cardiovascular: Negative for chest pain.  Gastrointestinal: Negative for abdominal pain.   Exam: Physical Exam  HENT:  Nose: No mucosal edema.  Mouth/Throat: No oropharyngeal exudate or posterior oropharyngeal edema.  Eyes: Pupils are equal, round, and reactive to light. Conjunctivae, EOM and lids are normal.  Neck: No JVD present. Carotid bruit is not present. No edema present. No thyroid mass and no thyromegaly present.  Cardiovascular: S1 normal and S2 normal. Exam reveals no gallop.  No murmur heard. Pulses:      Dorsalis pedis pulses are 2+ on the right side, and 2+ on the left side.  Respiratory: No respiratory distress. She has decreased breath sounds in the right lower field and the left lower field. She has no wheezes. She has no rhonchi. She has no rales.  GI: Soft. Bowel sounds are normal. There is no tenderness.  Musculoskeletal:       Right ankle: She exhibits swelling.       Left ankle: She exhibits swelling.  Lymphadenopathy:    She has no cervical adenopathy.  Neurological: She is alert. No cranial nerve deficit.  Skin: Skin is warm. No rash noted. Nails show no clubbing.   Psychiatric: She has a normal mood and affect.      Data Reviewed: Basic Metabolic Panel: Recent Labs  Lab 09/03/17 0423 09/04/17 0527 09/04/17 1702 09/05/17 0650 09/06/17 0430 09/07/17 0424  NA 137 138  --  140 140 140  K 3.2* 2.6* 3.6 3.9 3.7 3.3*  CL 99* 101  --  101 98* 100  CO2 26 26  --  27 27 28   GLUCOSE 127* 137*  --  108* 118* 123*  BUN 34* 22*  --  38* 51* 34*  CREATININE 3.72* 2.74*  --  4.30* 5.69* 4.37*  CALCIUM 8.1* 8.1*  --  8.6* 8.3* 8.2*  MG 1.9  --   --   --   --   --   PHOS 4.1  4.2 3.7  --  5.8* 7.8* 5.9*   Liver Function Tests: Recent Labs  Lab 09/01/17 0310 09/03/17 0423 09/04/17 0527 09/05/17 0650 09/06/17 0430 09/07/17 0424  AST 40  --   --   --   --   --   ALT 8*  --   --   --   --   --   ALKPHOS 110  --   --   --   --   --   BILITOT 0.8  --   --   --   --   --   PROT  6.7  --   --   --   --   --   ALBUMIN 2.9* 2.3* 2.4* 2.6* 2.5* 2.3*   CBC: Recent Labs  Lab 09/03/17 0423 09/04/17 0527 09/05/17 0650 09/06/17 0431 09/06/17 1017 09/06/17 1525 09/07/17 0424  WBC 11.1* 11.3* 9.7 9.2  --   --  5.8  NEUTROABS 9.1*  --   --   --   --   --  4.3  HGB 8.3* 9.0* 9.1* 8.2* 7.8* 7.7* 5.9*  HCT 24.3* 26.3* 26.5* 24.4* 23.5* 23.3* 17.8*  MCV 91.7 91.8 92.6 93.4  --   --  93.7  PLT 102* 120* 153 150  --   --  125*   Cardiac Enzymes: Recent Labs  Lab 09/01/17 0310  TROPONINI 0.28*    CBG: Recent Labs  Lab 09/06/17 1611 09/06/17 1953 09/06/17 2321 09/07/17 0422 09/07/17 0749  GLUCAP 112* 98 96 117* 137*    Recent Results (from the past 240 hour(s))  Culture, blood (Routine X 2) w Reflex to ID Panel     Status: None   Collection Time: 08/29/17  9:31 PM  Result Value Ref Range Status   Specimen Description BLOOD LEFT FOOT  Final   Special Requests   Final    BOTTLES DRAWN AEROBIC AND ANAEROBIC Blood Culture adequate volume   Culture   Final    NO GROWTH 5 DAYS Performed at [facility], [address]  [place], [place] [address]    Report Status 09/03/2017 FINAL  Final  Culture, blood (Routine X 2) w Reflex to ID Panel     Status: None   Collection Time: 08/29/17  9:50 PM  Result Value Ref Range Status   Specimen Description BLOOD RIGHT ANKLE  Final   Special Requests   Final    BOTTLES DRAWN AEROBIC AND ANAEROBIC Blood Culture adequate volume   Culture   Final    NO GROWTH 5 DAYS Performed at [facility], [address], [place], [place] [address]    Report Status 09/03/2017 FINAL  Final  C difficile quick scan w PCR reflex     Status: None   Collection Time: 09/02/17 12:31 PM  Result Value Ref Range Status   C Diff antigen NEGATIVE NEGATIVE Final   C Diff toxin NEGATIVE NEGATIVE Final   C Diff interpretation No C. difficile detected.  Final    Comment: Performed at [facility], [address] [place], [place] [address]  CULTURE, BLOOD (ROUTINE X 2) w Reflex to ID Panel     Status: None   Collection Time: 09/02/17  7:25 PM  Result Value Ref Range Status   Specimen Description BLOOD BLOOD RIGHT HAND  Final   Special Requests   Final    BOTTLES DRAWN AEROBIC AND ANAEROBIC Blood Culture adequate volume   Culture   Final    NO GROWTH 5 DAYS Performed at [facility], [address] [place], [place] [address]    Report Status 09/07/2017 FINAL  Final  CULTURE, BLOOD (ROUTINE X 2) w Reflex to ID Panel     Status: None   Collection Time: 09/02/17  7:25 PM  Result Value Ref Range Status   Specimen Description BLOOD BLOOD RIGHT HAND  Final   Special Requests   Final    BOTTLES DRAWN AEROBIC AND ANAEROBIC Blood Culture results may not be optimal due to an inadequate volume of blood received in culture bottles   Culture   Final  NO GROWTH 5 DAYS Performed at [facility], [address] [place], [place] [address]    Report Status 09/07/2017 FINAL  Final  Urine Culture     Status: Abnormal   Collection Time: 09/03/17  4:23 AM  Result Value  Ref Range Status   Specimen Description   Final    URINE, RANDOM Performed at [facility], [address], [place], [place] [address]    Special Requests   Final    NONE Performed at [facility], [address] [place], [place] [address]    Culture (A)  Final    >=100,000 COLONIES/mL DIPHTHEROIDS(CORYNEBACTERIUM SPECIES) Standardized susceptibility testing for this organism is not available. Performed at [facility], [place] [address] [place], [place] [address]    Report Status 09/04/2017 FINAL  Final      Scheduled Meds: . [MAR Hold] B-complex with vitamin C  1 tablet Per Tube QHS  . [MAR Hold] bisacodyl  10 mg Rectal Once  . [MAR Hold] chlorhexidine  15 mL Mouth Rinse BID  . [MAR Hold] Chlorhexidine Gluconate Cloth  6 each Topical Q0600  . [MAR Hold] feeding supplement (NEPRO CARB STEADY)  1,000 mL Per Tube Q24H  . [MAR Hold] feeding supplement (PRO-STAT SUGAR FREE 64)  30 mL Per Tube Daily  . [MAR Hold] insulin aspart  0-15 Units Subcutaneous Q4H  . [MAR Hold] insulin aspart  3 Units Subcutaneous Q4H  . [MAR Hold] insulin glargine  10 Units Subcutaneous Daily  . [MAR Hold] letrozole  2.5 mg Oral Daily  . [MAR Hold] mouth rinse  15 mL Mouth Rinse q12n4p  . [MAR Hold] nystatin cream   Topical BID  . [MAR Hold] pantoprazole  40 mg Intravenous Q12H  . [MAR Hold] sodium chloride flush  10-40 mL Intracatheter Q12H   Continuous Infusions: . amiodarone 30 mg/hr (09/07/17 0950)  . dextrose 5 % and 0.45% NaCl 35 mL/hr at 09/07/17 0000  . pantoprozole (PROTONIX) infusion 8 mg/hr (09/07/17 1306)    Assessment/Plan:  1. Sepsis with multiorgan failure and initial hypotension.  Likely combination of urinary tract infection versus tickborne disease.  Finished antibiotics. 2. Acute hypoxic respiratory failure with fluid overload.  Patient extubated on Thursday.  Tapered down on nasal cannula. 3. Upper GI bleed with hemoglobin of 5.9 today.  Will receive 2  units of packed red blood cells today.  Endoscopy showing irritation from NG tube suction.  Patient on Protonix drip.  Hopefully patient can have the NG tube removed. 4. Acute kidney injury.  ATN from sepsis and hypotension.  Hemodialysis yesterday.  Making urine.  Foley had to be placed for urinary retention.  Nephrology will watch off dialysis and see what happens with him the kidney function. 5. Elevated troponin demand ischemia in the setting of sepsis. 6. Atrial fibrillation with rapid ventricular response.  On amiodarone drip.  Currently in normal sinus rhythm. 7. Thrombocytopenia secondary to sepsis.  last platelet count 150..  8. History of breast cancer on Femara 9. Morbid obesity weight loss needed 10. History of sleep apnea on BiPAP at home  11. type 2 diabetes on sliding scale 12. Weakness.  Physical therapy evaluation needed  Code Status:     Code Status Orders  (From admission, onward)        Start     Ordered   08/22/17 1612  Full code  Continuous     08/22/17 1611    Code Status History    This  patient has a current code status but no historical code status.     Family Communication: Husband in the waiting room Disposition Plan: To be determined based on clinical course  Consultants:  Critical care specialist  Nephrology  Time spent: 25 minutes  [place]

## 2017-09-07 NOTE — Transfer of Care (Signed)
Immediate Anesthesia Transfer of Care Note  Patient: Bonnie Holt  Procedure(s) Performed: ESOPHAGOGASTRODUODENOSCOPY (EGD) WITH PROPOFOL (N/A )  Patient Location: PACU  Anesthesia Type:General  Level of Consciousness: awake, alert  and oriented  Airway & Oxygen Therapy: Patient Spontanous Breathing and Patient connected to face mask oxygen  Post-op Assessment: Report given to RN and Post -op Vital signs reviewed and stable  Post vital signs: Reviewed and stable  Last Vitals:  Vitals Value Taken Time  BP    Temp    Pulse    Resp    SpO2      Last Pain:  Vitals:   09/07/17 1200  TempSrc: Oral  PainSc:       Patients Stated Pain Goal: 0 (56/38/75 6433)  Complications: No apparent anesthesia complications

## 2017-09-07 NOTE — Progress Notes (Signed)
Nutrition Follow-up  DOCUMENTATION CODES:   Morbid obesity  INTERVENTION:  Will discontinue tube feed orders at patient's NGT has been removed and she is starting on clear liquid diet.  Once diet able to be advanced to at least full liquids, recommend providing Nepro Shake po TID, each supplement provides 425 kcal and 19 grams protein.  Will change vitamin to Rena-vite QHS.  NUTRITION DIAGNOSIS:   Inadequate oral intake related to inability to eat, lethargy/confusion as evidenced by NPO status.  Ongoing - patient had ileus so tube feedings have been held. Starting CLD now.  GOAL:   Patient will meet greater than or equal to 90% of their needs  Not progressing at this time.  MONITOR:   Diet advancement, Labs, Weight trends, TF tolerance, I & O's  REASON FOR ASSESSMENT:   Low [place], LOS, Consult Enteral/tube feeding initiation and management  ASSESSMENT:   72 year old female with PMHx of HTN, DM type 2, CAD, breast cancer s/p partial left mastectomy who was admitted with severe sepsis of unclear etiology, recent tick bite with concern for ehrlichiosis, probable PNA, acute encephalopathy, UTI, acute renal failure due to hypotension and sepsis requiring HD.   -Patient was emergently intubated on evening of 6/18 for worsening mental status and increased respiratory distress. -Patient was extubated on 6/20.  Patient was taken down for EGD today. She was noted to have drop in hemoglobin and coffee-ground output from NGT. Findings from report were 2 oozing gastric ulcers that appeared to be caused by NG tube trauma s/p placement of 2 hemostatic clips. Patient will receive 2 units of pRBCs today. Discussed with RN in the afternoon and patient ended up pulling out her NGT. She is being started on a CLD today.  Enteral Access: 16 Fr. NGT placed 6/18; terminates in stomach per abdominal x-ray 6/18; 65 cm at right nare  TF: tube feeds have been held since early AM of 6/23 as patient  developed ileus  Medications reviewed and include: B-complex with C QHS, Dulcolax, Novolog 0-15 units Q4hrs, Novolog 3 units Q4hrs for tube feed coverage, Lantus 10 units daily, pantoprazole, amiodarone gtt, D5-1/2NS at 35 mL/hr (42 grams dextrose, 143 kcal daily).  Labs reviewed: CBG 96-137, Potassium 3.3, BUN 34, Creatinine 4.37, Phosphorus 5.9.  I/O: 1005 mL UOP yesterday (0.4 mL/kg/hr), 550 mL output from NGT yesterday (improvement as there was 1150 mL output from NGT the day prior), 1636 mL removed from HD today, 125 mL output in rectal tube  Weight trend: 105.3 kg on 6/25; -0.2 kg from admission  Discussed with RN and on rounds.  Diet Order:   Diet Order           Diet clear liquid Room service appropriate? Yes; Fluid consistency: Thin  Diet effective now          EDUCATION NEEDS:   Not appropriate for education at this time  Skin:  Skin Assessment: Skin Integrity Issues:(MSAD bilateral groin)  Last BM:  08/28/2017 - medium type 6  Height:   Ht Readings from Last 1 Encounters:  08/22/17 5\' 2"  (1.575 m)    Weight:   Wt Readings from Last 1 Encounters:  09/07/17 232 lb 2.3 oz (105.3 kg)    Ideal Body Weight:  50 kg  BMI:  Body mass index is 42.46 kg/m.  Estimated Nutritional Needs:   Kcal:  1790-2090 (MSJ x 1.2-1.4)  Protein:  100-110 grams (0.9-1 grams/kg; 2 grams/kg IBW)  Fluid:  UOP + 1 L  Natayah Warmack  Minette Brine, [place], [place], [place] Office: [phone] Pager: [phone] After Hours/Weekend Pager: [phone]

## 2017-09-07 NOTE — Anesthesia Post-op Follow-up Note (Signed)
Anesthesia QCDR form completed.        

## 2017-09-07 NOTE — Progress Notes (Signed)
[facility]  ROUNDING NOTE   Subjective:   Husband at bedside.   Endoscopy today for acute GI bleed. Hemoglobin 5.9 - scheduled for 2 units PRBC. Gastric ulcers were clipped by Dr. Allen Norris.   Hemodialysis treatment yesterday. Tolerated treatment well. UF 1.6 liters.   UOP 528mL  Objective:  Vital signs in last 24 hours:  Temp:  [97.5 F (36.4 C)-99 F (37.2 C)] 98.2 F (36.8 C) (06/25 1400) Pulse Rate:  [78-103] 89 (06/25 1500) Resp:  [15-25] 18 (06/25 1500) BP: (117-180)/(53-91) 146/62 (06/25 1500) SpO2:  [83 %-100 %] 83 % (06/25 1500) Weight:  [105.3 kg (232 lb 2.3 oz)-112.9 kg (248 lb 14.4 oz)] 105.3 kg (232 lb 2.3 oz) (06/25 0416)  Weight change: 2 kg (4 lb 6.6 oz) Filed Weights   09/06/17 1700 09/06/17 2005 09/07/17 0416  Weight: 112.9 kg (248 lb 14.4 oz) 110.7 kg (244 lb 0.8 oz) 105.3 kg (232 lb 2.3 oz)    Intake/Output: I/O last 3 completed shifts: In: 1042.3 [I.V.:960.6; Other:40; IV Piggyback:41.7] Out: 2831 [Urine:1005; Emesis/NG output:1700; DVVOH:6073; Stool:125]   Intake/Output this shift:  Total I/O In: 370 [I.V.:40; Blood:330] Out: -   Physical Exam: General: Ill appearing  Head: NG tube to suction  Eyes: Anicteric, PERRL  Neck: Supple, trachea midline  Lungs:  Clear to auscultation  Heart: Irregular rhythm  Abdomen:  Hypoactive bowel sounds. nontender  Extremities: trace peripheral edema.  Neurologic: Nonfocal, moving all four extremities  Skin: No lesions  Access: Left IJ temp HD catheter    Basic Metabolic Panel: Recent Labs  Lab 09/03/17 0423 09/04/17 0527 09/04/17 1702 09/05/17 0650 09/06/17 0430 09/07/17 0424  NA 137 138  --  140 140 140  K 3.2* 2.6* 3.6 3.9 3.7 3.3*  CL 99* 101  --  101 98* 100  CO2 26 26  --  27 27 28   GLUCOSE 127* 137*  --  108* 118* 123*  BUN 34* 22*  --  38* 51* 34*  CREATININE 3.72* 2.74*  --  4.30* 5.69* 4.37*  CALCIUM 8.1* 8.1*  --  8.6* 8.3* 8.2*  MG 1.9  --   --   --   --   --   PHOS  4.1  4.2 3.7  --  5.8* 7.8* 5.9*    Liver Function Tests: Recent Labs  Lab 09/01/17 0310 09/03/17 0423 09/04/17 0527 09/05/17 0650 09/06/17 0430 09/07/17 0424  AST 40  --   --   --   --   --   ALT 8*  --   --   --   --   --   ALKPHOS 110  --   --   --   --   --   BILITOT 0.8  --   --   --   --   --   PROT 6.7  --   --   --   --   --   ALBUMIN 2.9* 2.3* 2.4* 2.6* 2.5* 2.3*   No results for input(s): LIPASE, AMYLASE in the last 168 hours. No results for input(s): AMMONIA in the last 168 hours.  CBC: Recent Labs  Lab 09/03/17 0423 09/04/17 0527 09/05/17 0650 09/06/17 0431 09/06/17 1017 09/06/17 1525 09/07/17 0424  WBC 11.1* 11.3* 9.7 9.2  --   --  5.8  NEUTROABS 9.1*  --   --   --   --   --  4.3  HGB 8.3* 9.0* 9.1* 8.2* 7.8* 7.7* 5.9*  HCT 24.3*  26.3* 26.5* 24.4* 23.5* 23.3* 17.8*  MCV 91.7 91.8 92.6 93.4  --   --  93.7  PLT 102* 120* 153 150  --   --  125*    Cardiac Enzymes: Recent Labs  Lab 09/01/17 0310  TROPONINI 0.28*    BNP: Invalid input(s): POCBNP  CBG: Recent Labs  Lab 09/06/17 1611 09/06/17 1953 09/06/17 2321 09/07/17 0422 09/07/17 0749  GLUCAP 112* 98 96 117* 137*    Microbiology: Results for orders placed or performed during the hospital encounter of 08/22/17  Blood Culture (routine x 2)     Status: None   Collection Time: 08/22/17 12:52 PM  Result Value Ref Range Status   Specimen Description BLOOD RIGHT HAND  Final   Special Requests   Final    BOTTLES DRAWN AEROBIC AND ANAEROBIC Blood Culture adequate volume   Culture   Final    NO GROWTH 5 DAYS Performed at [facility], [address] [place], [place] [address]    Report Status 08/27/2017 FINAL  Final  Urine culture     Status: Abnormal   Collection Time: 08/22/17 12:52 PM  Result Value Ref Range Status   Specimen Description   Final    URINE, RANDOM Performed at [facility], [address] [place], [place] [address]    Special Requests   Final     NONE Performed at [facility], [address], [place], [place] [address]    Culture (A)  Final    >=100,000 COLONIES/mL KLEBSIELLA PNEUMONIAE 60,000 COLONIES/mL PROTEUS MIRABILIS    Report Status 08/24/2017 FINAL  Final   Organism ID, Bacteria KLEBSIELLA PNEUMONIAE (A)  Final   Organism ID, Bacteria PROTEUS MIRABILIS (A)  Final      Susceptibility   Klebsiella pneumoniae - MIC*    AMPICILLIN RESISTANT Resistant     CEFAZOLIN <=4 SENSITIVE Sensitive     CEFTRIAXONE <=1 SENSITIVE Sensitive     CIPROFLOXACIN <=0.25 SENSITIVE Sensitive     GENTAMICIN <=1 SENSITIVE Sensitive     IMIPENEM <=0.25 SENSITIVE Sensitive     NITROFURANTOIN 64 INTERMEDIATE Intermediate     TRIMETH/SULFA <=20 SENSITIVE Sensitive     AMPICILLIN/SULBACTAM 4 SENSITIVE Sensitive     PIP/TAZO <=4 SENSITIVE Sensitive     Extended ESBL NEGATIVE Sensitive     * >=100,000 COLONIES/mL KLEBSIELLA PNEUMONIAE   Proteus mirabilis - MIC*    AMPICILLIN <=2 SENSITIVE Sensitive     CEFAZOLIN <=4 SENSITIVE Sensitive     CEFTRIAXONE <=1 SENSITIVE Sensitive     CIPROFLOXACIN <=0.25 SENSITIVE Sensitive     GENTAMICIN <=1 SENSITIVE Sensitive     IMIPENEM 1 SENSITIVE Sensitive     NITROFURANTOIN 128 RESISTANT Resistant     TRIMETH/SULFA <=20 SENSITIVE Sensitive     AMPICILLIN/SULBACTAM <=2 SENSITIVE Sensitive     PIP/TAZO <=4 SENSITIVE Sensitive     * 60,000 COLONIES/mL PROTEUS MIRABILIS  Blood Culture (routine x 2)     Status: None   Collection Time: 08/22/17 12:53 PM  Result Value Ref Range Status   Specimen Description BLOOD RIGHT ARM  Final   Special Requests   Final    BOTTLES DRAWN AEROBIC AND ANAEROBIC Blood Culture results may not be optimal due to an excessive volume of blood received in culture bottles   Culture   Final    NO GROWTH 5 DAYS Performed at [facility], [address], [place], [place] [address]    Report Status 08/27/2017 FINAL  Final  MRSA PCR Screening     Status: None    Collection Time: 08/22/17  9:18 PM  Result Value Ref Range Status   MRSA by PCR NEGATIVE NEGATIVE Final    Comment:        The GeneXpert MRSA Assay (FDA approved for NASAL specimens only), is one component of a comprehensive MRSA colonization surveillance program. It is not intended to diagnose MRSA infection nor to guide or monitor treatment for MRSA infections. Performed at [facility], [address] [place], [place] [address]   Culture, blood (Routine X 2) w Reflex to ID Panel     Status: None   Collection Time: 08/29/17  9:31 PM  Result Value Ref Range Status   Specimen Description BLOOD LEFT FOOT  Final   Special Requests   Final    BOTTLES DRAWN AEROBIC AND ANAEROBIC Blood Culture adequate volume   Culture   Final    NO GROWTH 5 DAYS Performed at [facility], [address], [place], [place] [address]    Report Status 09/03/2017 FINAL  Final  Culture, blood (Routine X 2) w Reflex to ID Panel     Status: None   Collection Time: 08/29/17  9:50 PM  Result Value Ref Range Status   Specimen Description BLOOD RIGHT ANKLE  Final   Special Requests   Final    BOTTLES DRAWN AEROBIC AND ANAEROBIC Blood Culture adequate volume   Culture   Final    NO GROWTH 5 DAYS Performed at [facility], [address] [place], [place] [address]    Report Status 09/03/2017 FINAL  Final  C difficile quick scan w PCR reflex     Status: None   Collection Time: 09/02/17 12:31 PM  Result Value Ref Range Status   C Diff antigen NEGATIVE NEGATIVE Final   C Diff toxin NEGATIVE NEGATIVE Final   C Diff interpretation No C. difficile detected.  Final    Comment: Performed at [facility], [address] [place], [place] [address]  CULTURE, BLOOD (ROUTINE X 2) w Reflex to ID Panel     Status: None   Collection Time: 09/02/17  7:25 PM  Result Value Ref Range Status   Specimen Description BLOOD BLOOD RIGHT HAND  Final   Special Requests   Final    BOTTLES  DRAWN AEROBIC AND ANAEROBIC Blood Culture adequate volume   Culture   Final    NO GROWTH 5 DAYS Performed at [facility], [address] [place], [place] [address]    Report Status 09/07/2017 FINAL  Final  CULTURE, BLOOD (ROUTINE X 2) w Reflex to ID Panel     Status: None   Collection Time: 09/02/17  7:25 PM  Result Value Ref Range Status   Specimen Description BLOOD BLOOD RIGHT HAND  Final   Special Requests   Final    BOTTLES DRAWN AEROBIC AND ANAEROBIC Blood Culture results may not be optimal due to an inadequate volume of blood received in culture bottles   Culture   Final    NO GROWTH 5 DAYS Performed at [facility], [address] [place], [place] [address]    Report Status 09/07/2017 FINAL  Final  Urine Culture     Status: Abnormal   Collection Time: 09/03/17  4:23 AM  Result Value Ref Range Status   Specimen Description   Final    URINE, RANDOM Performed at [facility], [address], [place],  [address]  Special Requests   Final    NONE Performed at [facility], [address] [place], [place] [address]    Culture (A)  Final    >=100,000 COLONIES/mL DIPHTHEROIDS(CORYNEBACTERIUM SPECIES) Standardized susceptibility testing for this organism is not available. Performed at [facility], [place] [address] [place], [place] [address]    Report Status 09/04/2017 FINAL  Final    Coagulation Studies: No results for input(s): LABPROT, INR in the last 72 hours.  Urinalysis: No results for input(s): COLORURINE, LABSPEC, PHURINE, GLUCOSEU, HGBUR, BILIRUBINUR, KETONESUR, PROTEINUR, UROBILINOGEN, NITRITE, LEUKOCYTESUR in the last 72 hours.  Invalid input(s): APPERANCEUR    Imaging: Dg Abd 1 View  Result Date: 09/06/2017 CLINICAL DATA:  Ileus and abdominal pain. Recent bowel movement after an enema. EXAM: ABDOMEN - 1 VIEW COMPARISON:  Abdominal radiograph of September 05, 2017 FINDINGS: There is a moderate amount of gas  within normal caliber:. The a few loops of nondistended gas-filled small bowel are evident. No free extraluminal gas collections are observed. An esophagogastric tube is present whose tip in proximal port project over the gastric body. There is an ovoid calcification measuring approximately 1.5 x 2 cm in the right upper quadrant which may reflect a gallstone. The bony structures exhibit no acute abnormality. There is a radiodense structure in the projecting over the lower pelvis similar to that seen on yesterday's study which is of uncertain significance. IMPRESSION: Moderate amount of gas within large and small bowel which may reflect residual mild ileus. The stool burden is not excessive. No evidence of obstruction or perforation. Probable gallstone. The esophagogastric tube is in reasonable position. Faintly radiodense cylindrical appearing structure projecting over the lower pelvis in the midline similar to that seen on yesterday's study. Electronically Signed   By: David  Martinique M.D.   On: 09/06/2017 14:20     Medications:   . amiodarone 30 mg/hr (09/07/17 0950)  . dextrose 5 % and 0.45% NaCl 35 mL/hr at 09/07/17 0000  . pantoprozole (PROTONIX) infusion 8 mg/hr (09/07/17 1306)   . bisacodyl  10 mg Rectal Once  . chlorhexidine  15 mL Mouth Rinse BID  . Chlorhexidine Gluconate Cloth  6 each Topical Q0600  . insulin aspart  0-15 Units Subcutaneous Q4H  . insulin aspart  3 Units Subcutaneous Q4H  . insulin glargine  10 Units Subcutaneous Daily  . letrozole  2.5 mg Oral Daily  . mouth rinse  15 mL Mouth Rinse q12n4p  . multivitamin  1 tablet Oral QHS  . nystatin cream   Topical BID  . [START ON 09/10/2017] pantoprazole  40 mg Intravenous Q12H  . sodium chloride flush  10-40 mL Intracatheter Q12H   acetaminophen **OR** acetaminophen, bisacodyl, hydrALAZINE, ipratropium-albuterol, metoprolol tartrate, [DISCONTINUED] ondansetron **OR** ondansetron (ZOFRAN) IV, sodium chloride flush  Assessment/  Plan:  Bonnie Holt is a 72 y.o. white female with diabetes mellitus type II, coronary artery disease, obstructive sleep apnea, history of breast cancer, was admitted on6/9/2019with fever, chills, tick bite.  1. Acute renal failure on chronic kidney disease stage III Baseline creatinine of 1.1, GFR of 49 on 06/01/17  Requiring hemodialysis. Last hemodialysis treatment was yesterday 6/24.  Nonoliguric urine output.  Monitor daily for dialysis need.  No acute indication for dialysis today.   2. Sepsis with Urinary tract infection and recent tick bite (concern for tick borne illness). Urine culture with corynebacterium species. Completed course of antibiotics.   3. Anemia with renal failure: now with peptic ulcer disease/GIB and  acute blood loss. Status post endoscopy with clipping of gastric vessels by Dr. Allen Norris on 6/25  Scheduled for 2 units PRBC transfusion today.    LOS: [place] 6/25/20193:32 PM

## 2017-09-07 NOTE — Anesthesia Postprocedure Evaluation (Signed)
Anesthesia Post Note  Patient: Bonnie Holt  Procedure(s) Performed: ESOPHAGOGASTRODUODENOSCOPY (EGD) WITH PROPOFOL (N/A )  Patient location during evaluation: Endoscopy Anesthesia Type: General Level of consciousness: awake and alert Pain management: pain level controlled Vital Signs Assessment: post-procedure vital signs reviewed and stable Respiratory status: spontaneous breathing, nonlabored ventilation, respiratory function stable and patient connected to nasal cannula oxygen Cardiovascular status: blood pressure returned to baseline and stable Postop Assessment: no apparent nausea or vomiting Anesthetic complications: no     Last Vitals:  Vitals:   09/07/17 1257 09/07/17 1306  BP: (!) 154/66 (!) 147/60  Pulse: 95 88  Resp: 17 (!) 25  Temp:  36.6 C  SpO2: 98% 97%    Last Pain:  Vitals:   09/07/17 1306  TempSrc:   PainSc: 0-No pain                 Precious Haws Tkai Large

## 2017-09-08 ENCOUNTER — Inpatient Hospital Stay: Payer: Self-pay

## 2017-09-08 LAB — TYPE AND SCREEN
ABO/RH(D): O POS
ANTIBODY SCREEN: NEGATIVE
UNIT DIVISION: 0
Unit division: 0

## 2017-09-08 LAB — CBC WITH DIFFERENTIAL/PLATELET
BASOS ABS: 0 10*3/uL (ref 0–0.1)
BASOS PCT: 1 %
EOS PCT: 2 %
Eosinophils Absolute: 0.1 10*3/uL (ref 0–0.7)
HCT: 29.1 % — ABNORMAL LOW (ref 35.0–47.0)
Hemoglobin: 10.1 g/dL — ABNORMAL LOW (ref 12.0–16.0)
LYMPHS ABS: 0.7 10*3/uL — AB (ref 1.0–3.6)
LYMPHS PCT: 11 %
MCH: 31.5 pg (ref 26.0–34.0)
MCHC: 34.5 g/dL (ref 32.0–36.0)
MCV: 91.2 fL (ref 80.0–100.0)
MONO ABS: 1 10*3/uL — AB (ref 0.2–0.9)
Monocytes Relative: 16 %
NEUTROS ABS: 4.6 10*3/uL (ref 1.4–6.5)
Neutrophils Relative %: 70 %
PLATELETS: 154 10*3/uL (ref 150–440)
RBC: 3.19 MIL/uL — ABNORMAL LOW (ref 3.80–5.20)
RDW: 16.6 % — ABNORMAL HIGH (ref 11.5–14.5)
WBC: 6.5 10*3/uL (ref 3.6–11.0)

## 2017-09-08 LAB — GLUCOSE, CAPILLARY
GLUCOSE-CAPILLARY: 91 mg/dL (ref 70–99)
GLUCOSE-CAPILLARY: 93 mg/dL (ref 70–99)
Glucose-Capillary: 105 mg/dL — ABNORMAL HIGH (ref 70–99)
Glucose-Capillary: 125 mg/dL — ABNORMAL HIGH (ref 70–99)
Glucose-Capillary: 139 mg/dL — ABNORMAL HIGH (ref 70–99)
Glucose-Capillary: 140 mg/dL — ABNORMAL HIGH (ref 70–99)

## 2017-09-08 LAB — BPAM RBC
Blood Product Expiration Date: 201907192359
Blood Product Expiration Date: 201907202359
ISSUE DATE / TIME: 201906251156
ISSUE DATE / TIME: 201906251509
Unit Type and Rh: 5100
Unit Type and Rh: 5100

## 2017-09-08 LAB — RENAL FUNCTION PANEL
ALBUMIN: 2.5 g/dL — AB (ref 3.5–5.0)
Anion gap: 11 (ref 5–15)
BUN: 41 mg/dL — ABNORMAL HIGH (ref 8–23)
CALCIUM: 8.2 mg/dL — AB (ref 8.9–10.3)
CO2: 28 mmol/L (ref 22–32)
CREATININE: 5.41 mg/dL — AB (ref 0.44–1.00)
Chloride: 101 mmol/L (ref 98–111)
GFR calc Af Amer: 8 mL/min — ABNORMAL LOW (ref >60)
GFR, EST NON AFRICAN AMERICAN: 7 mL/min — AB (ref >60)
Glucose, Bld: 104 mg/dL — ABNORMAL HIGH (ref 70–99)
PHOSPHORUS: 7.3 mg/dL — AB (ref 2.5–4.6)
Potassium: 3.5 mmol/L (ref 3.5–5.1)
SODIUM: 140 mmol/L (ref 135–145)

## 2017-09-08 MED ORDER — AMIODARONE HCL 200 MG PO TABS
200.0000 mg | ORAL_TABLET | Freq: Two times a day (BID) | ORAL | Status: DC
  Administered 2017-09-08 – 2017-09-15 (×14): 200 mg via ORAL
  Filled 2017-09-08 (×14): qty 1

## 2017-09-08 MED ORDER — PANTOPRAZOLE SODIUM 40 MG IV SOLR
40.0000 mg | Freq: Two times a day (BID) | INTRAVENOUS | Status: DC
  Administered 2017-09-08 – 2017-09-09 (×3): 40 mg via INTRAVENOUS
  Filled 2017-09-08 (×2): qty 40

## 2017-09-08 NOTE — Progress Notes (Signed)
   09/08/17 1055  Clinical Encounter Type  Visited With Family  Visit Type Follow-up  Spiritual Encounters  Spiritual Needs Emotional   Chaplain encountered patient's spouse in the hallway; spouse's sister joined the conversation at two different points.  Patient's spouse expressed thoughts and feelings regarding patient's extended stay as well as his questions regarding patient's afib.  Chaplain encouraged patient's spouse and his sister to bring these questions when they meet with the physician.  Chaplain engaged in active and reflective listening with patient's spouse and his sister regarding their thoughts and hopes regarding patient prognosis and plan of care.  Chaplain again encouraged them to bring their questions to the physician to seek clarification.  Chaplain checked on patient's spouse's behalf as to whether the procedure had been completed.  Patient's nurse reported on time frame and chaplain relayed information to [name] and his sister.  Chaplain plans to continue offering emotional support to patient and family while they navigate patient's health issues and plan of care.

## 2017-09-08 NOTE — Progress Notes (Signed)
Follows commands Taking clear liquid diet No SOB Needs HD  S/p EGD s/p clipping   Extubated 09/02/17  Vitals:   09/08/17 0600 09/08/17 0700 09/08/17 0730 09/08/17 0800  BP: (!) 149/49 (!) 153/128  (!) 121/48  Pulse: 78 76 80 79  Resp: (!) 21 13 16 16   Temp:    98.1 F (36.7 C)  TempSrc:    Axillary  SpO2: 95% 90% 92% 95%  Weight:      Height:       Gen: comfortable in bed, seen on BIPA HEENT: NCAT, sclera white, dry oral cavity Neck: JVP not visualized Lungs: breath sounds full anteriorly without wheezes or other adventitious sounds Cardiovascular: RRR, no murmurs Abdomen: Obese, soft, nontender, inaudible BS Ext: without clubbing, cyanosis,1+ edema upper extremities Neuro: Awake and appropriate Skin: Limited exam, no lesions noted, groin yeast       Anti-infectives (From admission, onward)   Start     Dose/Rate Route Frequency Ordered Stop   09/06/17 1400  erythromycin 500 mg in sodium chloride 0.9 % 100 mL IVPB  Status:  Discontinued     500 mg 100 mL/hr over 60 Minutes Intravenous Every 8 hours 09/06/17 1113 09/06/17 1550   08/25/17 1100  cefTRIAXone (ROCEPHIN) 1 g in sodium chloride 0.9 % 100 mL IVPB  Status:  Discontinued     1 g 200 mL/hr over 30 Minutes Intravenous Every 24 hours 08/25/17 1048 08/31/17 1538   08/25/17 1100  doxycycline (VIBRAMYCIN) 100 mg in sodium chloride 0.9 % 250 mL IVPB     100 mg 125 mL/hr over 120 Minutes Intravenous Every 12 hours 08/25/17 1057 09/04/17 0220   08/24/17 1230  ceFAZolin (ANCEF) IVPB 1 g/50 mL premix  Status:  Discontinued     1 g 100 mL/hr over 30 Minutes Intravenous Every 12 hours 08/24/17 1224 08/25/17 1048   08/22/17 2200  piperacillin-tazobactam (ZOSYN) IVPB 3.375 g  Status:  Discontinued     3.375 g 12.5 mL/hr over 240 Minutes Intravenous Every 8 hours 08/22/17 1509 08/24/17 1221   08/22/17 2200  vancomycin (VANCOCIN) IVPB 1000 mg/200 mL premix  Status:  Discontinued     1,000 mg 200 mL/hr over 60 Minutes  Intravenous Every 24 hours 08/22/17 1509 08/23/17 2008   08/22/17 1400  piperacillin-tazobactam (ZOSYN) IVPB 3.375 g     3.375 g 100 mL/hr over 30 Minutes Intravenous  Once 08/22/17 1355 08/22/17 1431   08/22/17 1400  vancomycin (VANCOCIN) IVPB 1000 mg/200 mL premix     1,000 mg 200 mL/hr over 60 Minutes Intravenous  Once 08/22/17 1355 08/22/17 1510       MRI brain 06/19: No definite acute intracranial abnormality. 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 IMPRESSION: Recent tick bite - concern for ehrlichiosis (serologies negative) vs RSMF ID F/U. Has completed Doxycycline therapy Probable LLL PNA - treated Kelbsiella/proteus UTI - fully treated Acute encephalopathy resolved Elevated trop I, likely demand ischemia - resolved Pulmonary edema, resolved Chronic LBBB PAF with RVR 6/19 > NSR after amiodarone initiated-need to re-consult cardiology Acute renal failure on HD therapy as per Nephrology team Elevated transaminases, resolved Thrombocytopenia, improving Acute protein-calorie malnutrition Low TSH, low free T4 of unclear etiology. Free T3 pending Diarrhoea- cdiff negative Ileus- persistent Hypokalemia resolved Pyrexia- has resolved; repeated cultures negative Anemia of critical illness H/O breast Ca H/O CAD Acute anemia from GI bleed s/P EGD- NGT induced gastritis without active blood loss  PLAN/REC: resp issues resolved Monitor I/Os Correct electrolytes as indicated HD per  Nephrology  completed 10 days of doxycycline  DVT px: SCDs Transfuse per usual guideline Will get PT/OT consult liquid diet PPI  OK to transfer to gen med floor   [place] Patricia Pesa, M.D.  [facility]  Medical Director [place] Director [facility] Cardio-Pulmonary Department

## 2017-09-08 NOTE — Progress Notes (Signed)
Pt's husband and sister-in-law asking when pt would be moved out of the ICU and what the plan is. I explained that while she was on the amio drip that she would remain in the ICU. Husband asked what the amio drip is for and RN explained that it was to help keep her rate/rythym controlled as she had previously gone into a fib with RVR this admission. Pt's husband expressed concern as he had been told the a fib was just a bump in the road and he didn't think it was an ongoing issue. Sister-in-law asked if Dr. Clayborn Bigness had seen her or would see her as he is her cardiologist on an outpatient basis. RN explained that I hadn't had a chance to look through the most recent notes but that I would. RN spoke with Dr. Mortimer Fries regarding family's concerns and Dr. Mortimer Fries asked that whoever was on call for carodiology be contacted and asked to see the patient. Dr. Mortimer Fries also stated that he would speak with family sometime after rounds. Dr. Clayborn Bigness contacted and given the background of patient's admission and the family's concerns. Dr. Clayborn Bigness asked for the consult order to be put back in and he would come see her today.

## 2017-09-08 NOTE — Clinical Social Work Note (Signed)
Clinical Social Work Assessment  Patient Details  Name: Bonnie Holt MRN: [id number] Date of Birth: 1946-02-01  Date of referral:  09/08/17               Reason for consult:  Facility Placement                Permission sought to share information with:  Family Supports Permission granted to share information::  Yes, Release of Information Signed  Name::     Bonnie Holt  Agency::     Relationship::  Spouse  Contact Information:  [phone]  Housing/Transportation Living arrangements for the past 2 months:  [place] of Information:  Patient, Spouse Patient Interpreter Needed:  None Criminal Activity/Legal Involvement Pertinent to Current Situation/Hospitalization:  No - Comment as needed Significant Relationships:  Spouse, Siblings Lives with:  Spouse, Pets(Dog) Do you feel safe going back to the place where you live?  Yes Need for family participation in patient care:  Yes (Comment)  Care giving concerns:  Patient lives alone with husband.   Social Worker assessment / plan:  CSW met with patient and husband, Bonnie Holt [phone]) to address consult for short term rehab (STR) placement at a [facility] (SNF). CSW introduced self and explained role of Education officer, museum. Physical therapy is recommending STR. CSW explained process of discharging to SNF. Pt and husband reluctant to agree to SNF at first, as the preference is to go home, but patient ultimately agreed to "give it a try." Patient has not been to a SNF before and does not have a preference. CSW provided SNF listing. Patient and husband would like to look over listing and speak with a sister this evening about choice. Patient to have a decision on Thursday. CSW completed FL-2 and PASRR. CSW will continue to follow.    Employment status:  Retired Forensic scientist:  Other (Comment Required)([employer]) PT Recommendations:  [place] / Referral to  community resources:  [place]  Patient/Family's Response to care:  Patient and husband are pleased that patient was able to move from ICU to telemetry after being in ICU for 17 days. Patient and husband verbalized they are pleased with the treatment plan and care they've received thus far.   Patient/Family's Understanding of and Emotional Response to Diagnosis, Current Treatment, and Prognosis:  Patient and husband understand patient's medical status. Patient stated flatly she will either "get better and go home or get worse and die." Patient and husband understand patient will benefit from STR prior to returning home.  Emotional Assessment Appearance:  Appears stated age Attitude/Demeanor/Rapport:  Other(Appropriate) Affect (typically observed):  Accepting, Adaptable Orientation:  Oriented to Self, Oriented to Situation, Oriented to Place Alcohol / Substance use:  Other Psych involvement (Current and /or in the community):  No (Comment)  Discharge Needs  Concerns to be addressed:  Care Coordination, Discharge Planning Concerns Readmission within the last 30 days:  No Current discharge risk:  Dependent with Mobility Barriers to Discharge:  Continued Medical Work up   Truitt Merle, LCSW 09/08/2017, 3:48 PM

## 2017-09-08 NOTE — Progress Notes (Signed)
Peripherally Inserted Central Catheter/Midline Placement  The IV Nurse has discussed with the patient and/or persons authorized to consent for the patient, the purpose of this procedure and the potential benefits and risks involved with this procedure.  The benefits include less needle sticks, lab draws from the catheter, and the patient may be discharged home with the catheter. Risks include, but not limited to, infection, bleeding, blood clot (thrombus formation), and puncture of an artery; nerve damage and irregular heartbeat and possibility to perform a PICC exchange if needed/ordered by physician.  Alternatives to this procedure were also discussed.  Bard Power PICC patient education guide, fact sheet on infection prevention and patient information card has been provided to patient /or left at bedside.    PICC/Midline Placement Documentation  PICC Triple Lumen 38/10/17 PICC Right Basilic 42 cm 0 cm (Active)  Indication for Insertion or Continuance of Line Vasoactive infusions 09/08/2017 11:16 AM  Exposed Catheter (cm) 0 cm 09/08/2017 11:16 AM  Site Assessment Clean;Dry;Intact 09/08/2017 11:16 AM  Lumen #1 Status Saline locked;Flushed;Blood return noted 09/08/2017 11:16 AM  Lumen #2 Status Flushed;Saline locked;Blood return noted 09/08/2017 11:16 AM  Lumen #3 Status Flushed;Saline locked;Blood return noted 09/08/2017 11:16 AM  Dressing Type Transparent;Securing device 09/08/2017 11:16 AM  Dressing Status Clean;Dry;Intact;Antimicrobial disc in place 09/08/2017 11:16 AM  Dressing Change Due 09/15/17 09/08/2017 11:16 AM   Previous PICC malpositioned. PICC exchanged via same site.    Bonnie Holt 09/08/2017, 11:19 AM

## 2017-09-08 NOTE — NC FL2 (Addendum)
[place] LEVEL OF CARE SCREENING TOOL     IDENTIFICATION  Patient Name: Bonnie Holt Birthdate: March 16, 1946 Sex: female Admission Date (Current Location): 08/22/2017  [place] and [place] Number:  Engineering geologist and Address:  [facility], [address] [place], [place] [address]      Provider Number: [id number]  Attending Physician Name and Address:  Loletha Grayer, MD  Relative Name and Phone Number:  Husband Jimya Ciani [phone]    Current Level of Care: Hospital Recommended Level of Care: [place] Prior Approval Number:    Date Approved/Denied:   PASRR Number: [id number]  Discharge Plan: SNF    Current Diagnoses: Patient Active Problem List   Diagnosis Date Noted  . Acute gastric ulcer with hemorrhage   . Palliative care encounter   . Respiratory failure ([place])   . Acute encephalopathy   . Severe sepsis ([place])   . Acute respiratory failure with hypoxia ([place])   . ARF (acute renal failure) ([place])   . Elevated liver function tests   . Encounter for intubation   . Sepsis ([place]) 08/22/2017  . UTI (urinary tract infection) 08/22/2017  . CAD (coronary artery disease) 08/22/2017  . Diabetes ([place]) 08/22/2017  . Primary cancer of upper outer quadrant of left female breast ([place]) 12/06/2016    Orientation RESPIRATION BLADDER Height & Weight     Self, Place, Time  O2(2L Nasal Cannula) Indwelling catheter(Acute urinary retention) Weight: 246 lb (111.6 kg) Height:  5\' 3"  (160 cm)  BEHAVIORAL SYMPTOMS/MOOD NEUROLOGICAL BOWEL NUTRITION STATUS      Continent Diet(Clear liquid diet; thin fluids)  AMBULATORY STATUS COMMUNICATION OF NEEDS Skin   Extensive Assist Verbally Normal                       Personal Care Assistance Level of Assistance  Bathing, Feeding, Dressing Bathing Assistance: Maximum assistance Feeding assistance: Limited assistance Dressing Assistance: Maximum assistance      Functional Limitations Info  Sight, Hearing, Speech Sight Info: Adequate Hearing Info: Adequate Speech Info: Adequate    SPECIAL CARE FACTORS FREQUENCY  PT (By licensed PT), OT (By licensed OT)     PT Frequency: 5x/week OT Frequency: 5x/week            Contractures Contractures Info: Not present    Additional Factors Info  Code Status, Allergies, Insulin Sliding Scale Code Status Info: Full Allergies Info: Ace Inhibitors   Insulin Sliding Scale Info: See Medication List       Current Medications (09/08/2017):  This is the current hospital active medication list Current Facility-Administered Medications  Medication Dose Route Frequency Provider Last Rate Last Dose  . acetaminophen (TYLENOL) tablet 650 mg  650 mg Per Tube Q6H PRN Wilhelmina Mcardle, MD   650 mg at 09/04/17 6301   Or  . acetaminophen (TYLENOL) suppository 650 mg  650 mg Rectal Q6H PRN Wilhelmina Mcardle, MD      . alum & mag hydroxide-simeth (MAALOX/MYLANTA) 200-200-20 MG/5ML suspension 15 mL  15 mL Oral Once Wilhelmina Mcardle, MD      . amiodarone (NEXTERONE PREMIX) 360-4.14 MG/200ML-% (1.8 mg/mL) IV infusion  30 mg/hr Intravenous Continuous Wilhelmina Mcardle, MD 16.7 mL/hr at 09/07/17 2050 30 mg/hr at 09/07/17 2050  . bisacodyl (DULCOLAX) suppository 10 mg  10 mg Rectal Daily PRN Idelle Crouch, MD      . bisacodyl (DULCOLAX) suppository 10 mg  10 mg Rectal Once Cammie Sickle  A, MD      . chlorhexidine (PERIDEX) 0.12 % solution 15 mL  15 mL Mouth Rinse BID Conforti, John, DO   15 mL at 09/08/17 0947  . Chlorhexidine Gluconate Cloth 2 % PADS 6 each  6 each Topical Q0600 Murlean Iba, MD   6 each at 09/08/17 0400  . hydrALAZINE (APRESOLINE) injection 10 mg  10 mg Intravenous Q2H PRN Tukov-Yual, Magdalene S, NP   10 mg at 09/07/17 1954  . insulin aspart (novoLOG) injection 0-15 Units  0-15 Units Subcutaneous Q4H Tukov-Yual, Magdalene S, NP   3 Units at 09/08/17 1208  . insulin aspart (novoLOG) injection 3  Units  3 Units Subcutaneous Q4H Wilhelmina Mcardle, MD   3 Units at 09/08/17 1208  . insulin glargine (LANTUS) injection 10 Units  10 Units Subcutaneous Daily Wilhelmina Mcardle, MD   10 Units at 09/08/17 0950  . ipratropium-albuterol (DUONEB) 0.5-2.5 (3) MG/3ML nebulizer solution 3 mL  3 mL Nebulization Q6H PRN Wilhelmina Mcardle, MD      . letrozole [facility]) tablet 2.5 mg  2.5 mg Oral Daily [name], MD   2.5 mg at 09/08/17 0947  . MEDLINE mouth rinse  15 mL Mouth Rinse q12n4p Tukov-Yual, Magdalene S, NP   15 mL at 09/08/17 1138  . metoprolol tartrate (LOPRESSOR) injection 2.5-5 mg  2.5-5 mg Intravenous Q3H PRN Wilhelmina Mcardle, MD      . multivitamin (RENA-VIT) tablet 1 tablet  1 tablet Oral QHS Lafayette Dragon, MD   Stopped at 09/07/17 2202  . nystatin cream (MYCOSTATIN)   Topical BID Lafayette Dragon, MD   1 application at 44/81/85 [phone]  . ondansetron (ZOFRAN) injection 4 mg  4 mg Intravenous Q6H PRN Idelle Crouch, MD   4 mg at 09/07/17 1209  . pantoprazole (PROTONIX) injection 40 mg  40 mg Intravenous Q12H Flora Lipps, MD   40 mg at 09/08/17 1139  . sodium chloride flush (NS) 0.9 % injection 10-40 mL  10-40 mL Intracatheter Q12H Fritzi Mandes, MD   30 mL at 09/08/17 0948  . sodium chloride flush (NS) 0.9 % injection 10-40 mL  10-40 mL Intracatheter PRN Fritzi Mandes, MD         Discharge Medications: Please see discharge summary for a list of discharge medications.  Relevant Imaging Results:  Relevant Lab Results:   Additional Information SSN: [id number]; New Hemodialysis patient  Truitt Merle, LCSW   Updated Jones Broom. [place], MSW, [place]  09/10/2017 11:03 AM

## 2017-09-08 NOTE — Progress Notes (Signed)
Bonnie Lame, MD [facility]   [address] [place], [place] [address] Phone: [phone] Fax : [phone]   Subjective: The patient hemoglobin has been stable.  There is no sign of any further bleeding.  The patient was transfused blood and her husband reports that she is looking much better today than she did yesterday.  The patient's hemoglobin was 9.9 yesterday and 10.1 today.   Objective: Vital signs in last 24 hours: Vitals:   09/08/17 0600 09/08/17 0700 09/08/17 0730 09/08/17 0800  BP: (!) 149/49 (!) 153/128  (!) 121/48  Pulse: 78 76 80 79  Resp: (!) 21 13 16 16   Temp:    98.1 F (36.7 C)  TempSrc:    Axillary  SpO2: 95% 90% 92% 95%  Weight:      Height:       Weight change: -14 lb 15.9 oz (-6.8 kg)  Intake/Output Summary (Last 24 hours) at 09/08/2017 1144 Last data filed at 09/08/2017 7253 Gross per 24 hour  Intake 2189.4 ml  Output 1405 ml  Net 784.4 ml     Exam: Heart:: Regular rate and rhythm, S1S2 present or without murmur or extra heart sounds Lungs: normal and clear to auscultation and percussion Abdomen: soft, nontender, normal bowel sounds   Lab Results: @LABTEST2 @ Micro Results: Recent Results (from the past 240 hour(s))  Culture, blood (Routine X 2) w Reflex to ID Panel     Status: None   Collection Time: 08/29/17  9:31 PM  Result Value Ref Range Status   Specimen Description BLOOD LEFT FOOT  Final   Special Requests   Final    BOTTLES DRAWN AEROBIC AND ANAEROBIC Blood Culture adequate volume   Culture   Final    NO GROWTH 5 DAYS Performed at [facility], [address] [place], [place] [address]    Report Status 09/03/2017 FINAL  Final  Culture, blood (Routine X 2) w Reflex to ID Panel     Status: None   Collection Time: 08/29/17  9:50 PM  Result Value Ref Range Status   Specimen Description BLOOD RIGHT ANKLE  Final   Special Requests   Final    BOTTLES DRAWN AEROBIC AND ANAEROBIC Blood Culture adequate volume   Culture    Final    NO GROWTH 5 DAYS Performed at [facility], [address] [place], [place] [address]    Report Status 09/03/2017 FINAL  Final  C difficile quick scan w PCR reflex     Status: None   Collection Time: 09/02/17 12:31 PM  Result Value Ref Range Status   C Diff antigen NEGATIVE NEGATIVE Final   C Diff toxin NEGATIVE NEGATIVE Final   C Diff interpretation No C. difficile detected.  Final    Comment: Performed at [facility], [address] [place], [place] [address]  CULTURE, BLOOD (ROUTINE X 2) w Reflex to ID Panel     Status: None   Collection Time: 09/02/17  7:25 PM  Result Value Ref Range Status   Specimen Description BLOOD BLOOD RIGHT HAND  Final   Special Requests   Final    BOTTLES DRAWN AEROBIC AND ANAEROBIC Blood Culture adequate volume   Culture   Final    NO GROWTH 5 DAYS Performed at [facility], [address] [place], [place] [address]    Report Status 09/07/2017 FINAL  Final  CULTURE, BLOOD (ROUTINE X 2) w Reflex to ID Panel     Status: None  Collection Time: 09/02/17  7:25 PM  Result Value Ref Range Status   Specimen Description BLOOD BLOOD RIGHT HAND  Final   Special Requests   Final    BOTTLES DRAWN AEROBIC AND ANAEROBIC Blood Culture results may not be optimal due to an inadequate volume of blood received in culture bottles   Culture   Final    NO GROWTH 5 DAYS Performed at [facility], [address], [place], [place] [address]    Report Status 09/07/2017 FINAL  Final  Urine Culture     Status: Abnormal   Collection Time: 09/03/17  4:23 AM  Result Value Ref Range Status   Specimen Description   Final    URINE, RANDOM Performed at [facility], [address] [place], [place] [address]    Special Requests   Final    NONE Performed at [facility], [address], [place], [place] [address]    Culture (A)  Final    >=100,000 COLONIES/mL DIPHTHEROIDS(CORYNEBACTERIUM  SPECIES) Standardized susceptibility testing for this organism is not available. Performed at [facility], [place] [address], [place], [place] [address]    Report Status 09/04/2017 FINAL  Final   Studies/Results: Dg Abd 1 View  Result Date: 09/06/2017 CLINICAL DATA:  Ileus and abdominal pain. Recent bowel movement after an enema. EXAM: ABDOMEN - 1 VIEW COMPARISON:  Abdominal radiograph of September 05, 2017 FINDINGS: There is a moderate amount of gas within normal caliber:. The a few loops of nondistended gas-filled small bowel are evident. No free extraluminal gas collections are observed. An esophagogastric tube is present whose tip in proximal port project over the gastric body. There is an ovoid calcification measuring approximately 1.5 x 2 cm in the right upper quadrant which may reflect a gallstone. The bony structures exhibit no acute abnormality. There is a radiodense structure in the projecting over the lower pelvis similar to that seen on yesterday's study which is of uncertain significance. IMPRESSION: Moderate amount of gas within large and small bowel which may reflect residual mild ileus. The stool burden is not excessive. No evidence of obstruction or perforation. Probable gallstone. The esophagogastric tube is in reasonable position. Faintly radiodense cylindrical appearing structure projecting over the lower pelvis in the midline similar to that seen on yesterday's study. Electronically Signed   By: David  Martinique M.D.   On: 09/06/2017 14:20   Dg Chest Port 1 View  Result Date: 09/07/2017 CLINICAL DATA:  PICC placement. EXAM: PORTABLE CHEST 1 VIEW COMPARISON:  09/02/2017. FINDINGS: Right PICC tip poorly visualized in the region of the confluence of the innominate veins. Stable left jugular double-lumen catheter. The endotracheal and nasogastric tubes have been removed. Stable enlarged cardiac silhouette. Increased prominence of the pulmonary vasculature and interstitial markings. No  significant change in a small to moderate-sized left pleural effusion with left basilar patchy opacity. Mild increase in size of a small right pleural effusion with interval patchy opacity at the right lung base. Thoracic spine degenerative changes. IMPRESSION: 1. Right PICC tip in the region of the origin of the superior vena cava. This would need to be advanced 7 cm to place it at the superior cavoatrial junction. 2. Progressive pulmonary vascular congestion with interval interstitial pulmonary edema with a probable of the lower component in the lower lung zones. 3. Stable left pleural effusion and left basilar pneumonia or patchy atelectasis. 4. Slight increase in size of a small right pleural effusion with interval mild right basilar alveolar edema,  pneumonia or patchy atelectasis. Electronically Signed   By: Claudie Revering M.D.   On: 09/07/2017 16:46   [place] Ekg Site Rite  Result Date: 09/08/2017 If Site Rite image not attached, placement could not be confirmed due to current cardiac rhythm.  Medications: I have reviewed the patient's current medications. Scheduled Meds: . alum & mag hydroxide-simeth  15 mL Oral Once  . bisacodyl  10 mg Rectal Once  . chlorhexidine  15 mL Mouth Rinse BID  . Chlorhexidine Gluconate Cloth  6 each Topical Q0600  . insulin aspart  0-15 Units Subcutaneous Q4H  . insulin aspart  3 Units Subcutaneous Q4H  . insulin glargine  10 Units Subcutaneous Daily  . letrozole  2.5 mg Oral Daily  . mouth rinse  15 mL Mouth Rinse q12n4p  . multivitamin  1 tablet Oral QHS  . nystatin cream   Topical BID  . pantoprazole  40 mg Intravenous Q12H  . sodium chloride flush  10-40 mL Intracatheter Q12H   Continuous Infusions: . amiodarone 30 mg/hr (09/07/17 2050)   PRN Meds:.acetaminophen **OR** acetaminophen, bisacodyl, hydrALAZINE, ipratropium-albuterol, metoprolol tartrate, [DISCONTINUED] ondansetron **OR** ondansetron (ZOFRAN) IV, sodium chloride flush   Assessment: Principal  Problem:   Sepsis ([place]) Active Problems:   UTI (urinary tract infection)   CAD (coronary artery disease)   Diabetes ([place])   ARF (acute renal failure) (HCC)   Elevated liver function tests   Encounter for intubation   Severe sepsis ([place])   Acute respiratory failure with hypoxia (HCC)   Respiratory failure ([place])   Acute encephalopathy   Palliative care encounter   Acute gastric ulcer with hemorrhage    Plan: The patient has been doing well without any sign of further bleeding.  The patient had ulcers in the stomach 1 of which was oozing blood and was clipped with 2 endoclips.  The patient is now stable without any further sign of bleeding.  Nothing further to do from a GI point of view except keep her on a PPI.  I will sign off.  Please call if any further GI concerns or questions.  We would like to thank you for the opportunity to participate in the care of Bonnie Holt.     LOS: 17 days   Yadhira Mckneely 09/08/2017, 11:44 AM

## 2017-09-08 NOTE — Progress Notes (Signed)
PT Cancellation Note  Patient Details Name: Bonnie Holt MRN: [id number] DOB: 1945-12-16   Cancelled Treatment:     Chart reviewed. PT attempted to see at this time however sterile procedure currently in progress upon arrival at room. PT will attempt at next date pt is medically stable.  Chantea Surace [name], SPT   Kaydense Rizo 09/08/2017, 11:15 AM

## 2017-09-08 NOTE — Clinical Social Work Placement (Signed)
   CLINICAL SOCIAL WORK PLACEMENT  NOTE  Date:  09/08/2017  Patient Details  Name: Bonnie Holt MRN: [id number] Date of Birth: 10-17-1945  Clinical Social Work is seeking post-discharge placement for this patient at the [place] level of care (*CSW will initial, date and re-position this form in  chart as items are completed):  Yes   Patient/family provided with [place] Work Department's list of facilities offering this level of care within the geographic area requested by the patient (or if unable, by the patient's family).  Yes   Patient/family informed of their freedom to choose among providers that offer the needed level of care, that participate in Medicare, Medicaid or managed care program needed by the patient, have an available bed and are willing to accept the patient.  Yes   Patient/family informed of [facility]'s ownership interest in [facility] and [facility], as well as of the fact that they are under no obligation to receive care at these facilities.  PASRR submitted to EDS on 09/08/17     PASRR number received on 09/08/17     Existing PASRR number confirmed on       FL2 transmitted to all facilities in geographic area requested by pt/family on       FL2 transmitted to all facilities within larger geographic area on       Patient informed that his/her managed care company has contracts with or will negotiate with certain facilities, including the following:            Patient/family informed of bed offers received.  Patient chooses bed at       Physician recommends and patient chooses bed at      Patient to be transferred to   on  .  Patient to be transferred to facility by       Patient family notified on   of transfer.  Name of family member notified:        PHYSICIAN Please sign FL2, Please prepare prescriptions, Please prepare priority discharge summary, including medications     Additional Comment:     _______________________________________________ Truitt Merle, LCSW 09/08/2017, 4:22 PM

## 2017-09-08 NOTE — Care Management (Signed)
Per progression rounds, patient plan to transfer to telemetry today. No LTAC needs. PT/OT evaluation.

## 2017-09-08 NOTE — Progress Notes (Addendum)
Patient needs AUTOCPAP at night and while sleeping for OSA Oxygen if needed  No acute resp issues at this time  Husband at bedside updated and notified. Husband is very satisfied with plan of care.  Ok to transfer to gen med floor 2A.

## 2017-09-08 NOTE — Progress Notes (Signed)
Physical Therapy Treatment Patient Details Name: Bonnie Holt MRN: [id number] DOB: [birth year] Today's Date: 09/08/2017    History of Present Illness Pt admitted to hospital on 08/22/17 and has been diagnosed with sepsis secondary to UTI, acute renal failure, and acute respiratory failure. Pts hospital stay has been complicated by fall on 8/41/32 intubation on 08/31/17 and extubation on 09/02/17. Pt has a past medical history that includes breast cancer, coronary artery disease, and HTN. Pt currently has temporary IJ catheter for HD, clearance from MD required for mobility.    PT Comments    Pt has been transferred from the CCU and is now on the telemetry floor. Pt follows commands well and does not appear to be at all agitated during visit. Pt agreeable to therapy and is very excited to get out of bed. Pts husband reports that pt has been OOB independently multiple times which can not be confirmed by nursing. Pt on 5L of O2 via [place] and remained on it throughout session. Oxygen saturation monitored throughout and remained in the high 90s even with activity. Pt instructed in supine there-ex which she tolerated well. Pt performs supine>sit with min assist and can sit unsupported EOB without gross LOB. Pt transferred sit<>stand with mod assist +2 to initiate movement however pt able to maintain standing with CGA to min assist. Pt amb 3' to chair beside bed using RW and min assist. Pt does complain of dizziness with change in position rating it an 8/10. Pt tolerated treatment well and was very thankful to PT for working with her. Clearance from MD given to get pt OOB with temporary IJ HD cath. Pt could benefit from continued skilled therapy at this time to improve deficits toward PLOF. PT will continue to work with pt at least 2x/week while admitted. D/c recommendations continue to be SNF.   Follow Up Recommendations  SNF     Equipment Recommendations  None recommended by PT    Recommendations for Other  Services       Precautions / Restrictions Precautions Precautions: Fall;Other (comment) Precaution Comments: L IJ HD cath. Clearance recieved to get pt OOB with L IJ HD cath Restrictions Weight Bearing Restrictions: No    Mobility  Bed Mobility Overal bed mobility: Needs Assistance Bed Mobility: Supine to Sit     Supine to sit: Min assist     General bed mobility comments: pt min assist including verbal and tactile cuing for hand placement. Pt requires increased time and UE support to perform.  Transfers Overall transfer level: Needs assistance Equipment used: Rolling walker (2 wheeled) Transfers: Sit to/from Stand Sit to Stand: Mod assist;+2 physical assistance         General transfer comment: pt requires mod assist +2 to transfer sit<>stand requiring more assistance to initiate movement. Pt able to maintain upright posture with CGA to min assist,  Ambulation/Gait Ambulation/Gait assistance: Min assist Gait Distance (Feet): 3 Feet Assistive device: Rolling walker (2 wheeled) Gait Pattern/deviations: Shuffle     General Gait Details: pt amb 3' with min assistance +2 and RW. Pt uses RW appropriately and safely.   Stairs             Wheelchair Mobility    Modified Rankin (Stroke Patients Only)       Balance  Cognition Arousal/Alertness: Awake/alert Behavior During Therapy: WFL for tasks assessed/performed Overall Cognitive Status: Within Functional Limits for tasks assessed                                        Exercises Other Exercises Other Exercises: Pt instructed and performed B LE ther-ex x10 with verbal cuing for technique including SLR, hip abd, LAQ, resisted knee flexion and extension, ankle pumps. pt tolerated well.    General Comments        Pertinent Vitals/Pain Pain Assessment: No/denies pain Pain Score: 0-No pain Pain Intervention(s): Monitored during  session;Repositioned    Home Living                      Prior Function            PT Goals (current goals can now be found in the care plan section) Acute Rehab PT Goals Patient Stated Goal: get better and go home PT Goal Formulation: With patient Time For Goal Achievement: 09/17/17 Potential to Achieve Goals: Fair Progress towards PT goals: Progressing toward goals    Frequency    Min 2X/week      PT Plan Current plan remains appropriate    Co-evaluation              AM-PAC PT "6 Clicks" Daily Activity  Outcome Measure  Difficulty turning over in bed (including adjusting bedclothes, sheets and blankets)?: Unable Difficulty moving from lying on back to sitting on the side of the bed? : Unable Difficulty sitting down on and standing up from a chair with arms (e.g., wheelchair, bedside commode, etc,.)?: Unable Help needed moving to and from a bed to chair (including a wheelchair)?: A Little Help needed walking in hospital room?: A Lot Help needed climbing 3-5 steps with a railing? : Total 6 Click Score: 9    End of Session Equipment Utilized During Treatment: Gait belt Activity Tolerance: Patient tolerated treatment well Patient left: in chair;with call bell/phone within reach;with chair alarm set;with family/visitor present Nurse Communication: Mobility status PT Visit Diagnosis: Unsteadiness on feet (R26.81);Other abnormalities of gait and mobility (R26.89);Muscle weakness (generalized) (M62.81);Ataxic gait (R26.0);Difficulty in walking, not elsewhere classified (R26.2)     Time: 9518-8416 PT Time Calculation (min) (ACUTE ONLY): 42 min  Charges:                       G Codes:       Axyl Sitzman [name], SPT    Afnan Cadiente 09/08/2017, 5:06 PM

## 2017-09-08 NOTE — Progress Notes (Signed)
Occupational Therapy Treatment Patient Details Name: Bonnie Holt MRN: [id number] DOB: [birth year] Today's Date: 09/08/2017    History of present illness Pt admitted to hospital on 08/22/17 and has been diagnosed with sepsis secondary to UTI, acute renal failure, and acute respiratory failure. Pts hospital stay has been complicated by fall on 8/33/82 intubation on 08/31/17 and extubation on 09/02/17. Pt has a past medical history that includes breast cancer, coronary artery disease, and HTN. Pt currently has temporary IJ catheter for HD, clearance from MD required for mobility.   OT comments  Pt seen for brief OT tx session this date, limited by pt reported fatigue. Pt up in recliner after mobility with PT. Pt/spouse/family instructed in BUE ther-ex with theraband for shoulder flexion/extension, abduction, elbow extension/flexion, and shoulder external rotation. All verbalized understanding. Instructed pt/spouse only to perform with therapist or with spouse. Theraband and handout with written and visual instructions left on counter in room. Pt appreciative for brief session, stating "thank you, I'm just so tired." Will continue to progress.   Follow Up Recommendations  SNF    Equipment Recommendations       Recommendations for Other Services      Precautions / Restrictions Precautions Precautions: Fall;Other (comment) Precaution Comments: L IJ HD cath. Clearance recieved to get pt OOB with L IJ HD cath Restrictions Weight Bearing Restrictions: No       Mobility Bed Mobility     General bed mobility comments: deferred, up in recliner  Transfers         General transfer comment: pt declines, too fatigued from recent PT session    Balance Overall balance assessment: Needs assistance                             ADL either performed or assessed with clinical judgement   ADL Overall ADL's : Needs assistance/impaired                                        General ADL Comments: pt requires mod assist for UB bathing/dressing and max assist for LB bathing/dressing     Vision Baseline Vision/History: Wears glasses Wears Glasses: At all times Patient Visual Report: No change from baseline     Perception     Praxis      Cognition Arousal/Alertness: Awake/alert Behavior During Therapy: WFL for tasks assessed/performed Overall Cognitive Status: Within Functional Limits for tasks assessed                                 General Comments: very tired        Exercises Exercises: Other exercises Other Exercises Other Exercises: Pt/spouse/family instructed in BUE ther-ex with theraband for shoulder flexion/extension, abduction, elbow extension/flexion, and shoulder external rotation. All verbalize understanding. Instructed to do only with a family member or therapist    Shoulder Instructions       General Comments      Pertinent Vitals/ Pain       Pain Assessment: No/denies pain Pain Score: 0-No pain Pain Intervention(s): Monitored during session  Home Living  Prior Functioning/Environment              Frequency  Min 2X/week        Progress Toward Goals  OT Goals(current goals can now be found in the care plan section)  Progress towards OT goals: Progressing toward goals  Acute Rehab OT Goals Patient Stated Goal: get better and go home OT Goal Formulation: With patient/family Time For Goal Achievement: 09/18/17 Potential to Achieve Goals: Good  Plan Discharge plan remains appropriate;Frequency remains appropriate    Co-evaluation                 AM-PAC PT "6 Clicks" Daily Activity     Outcome Measure   Help from another person eating meals?: A Little Help from another person taking care of personal grooming?: A Little Help from another person toileting, which includes using toliet, bedpan, or urinal?: A Lot Help from  another person bathing (including washing, rinsing, drying)?: A Lot Help from another person to put on and taking off regular upper body clothing?: A Lot Help from another person to put on and taking off regular lower body clothing?: A Lot 6 Click Score: 14    End of Session    OT Visit Diagnosis: Other abnormalities of gait and mobility (R26.89);Other symptoms and signs involving cognitive function   Activity Tolerance Patient limited by fatigue   Patient Left in chair;with call bell/phone within reach;with chair alarm set;with family/visitor present;with nursing/sitter in room   Nurse Communication          Time: 7672-0947 OT Time Calculation (min): 9 min  Charges: OT General Charges $OT Visit: 1 Visit OT Treatments $Therapeutic Exercise: 8-22 mins  Jeni Salles, MPH, MS, OTR/L ascom [phone] 09/08/17, 5:29 PM

## 2017-09-08 NOTE — Progress Notes (Signed)
Patient ID: Bonnie Holt, female   DOB: November 09, 1945, 72 y.o.   MRN: [id number] .   [facility] PROGRESS NOTE  Bonnie Holt:[id number] DOB: 1946-01-13 DOA: 08/22/2017 PCP: Kirk Ruths, MD  HPI/Subjective: Patient asking for ribeye steak.  Patient answers a few questions.  Hopeful to be moving out of the ICU today.  Rectal tube leaking a little bit.  Objective: Vitals:   09/08/17 1130 09/08/17 1200  BP:  (!) 154/60  Pulse: 71 76  Resp: 13 (!) 24  Temp:  98.5 F (36.9 C)  SpO2: 96% 100%    Filed Weights   09/06/17 2005 09/07/17 0416 09/08/17 0352  Weight: 110.7 kg (244 lb 0.8 oz) 105.3 kg (232 lb 2.3 oz) 106.1 kg (233 lb 14.5 oz)    ROS: Review of Systems  Unable to perform ROS: Acuity of condition  Respiratory: Negative for shortness of breath.   Cardiovascular: Negative for chest pain.  Gastrointestinal: Positive for diarrhea. Negative for abdominal pain.   Exam: Physical Exam  HENT:  Nose: No mucosal edema.  Mouth/Throat: No oropharyngeal exudate or posterior oropharyngeal edema.  Eyes: Pupils are equal, round, and reactive to light. Conjunctivae, EOM and lids are normal.  Neck: No JVD present. Carotid bruit is not present. No edema present. No thyroid mass and no thyromegaly present.  Cardiovascular: S1 normal and S2 normal. Exam reveals no gallop.  No murmur heard. Pulses:      Dorsalis pedis pulses are 2+ on the right side, and 2+ on the left side.  Respiratory: No respiratory distress. She has decreased breath sounds in the right lower field and the left lower field. She has no wheezes. She has no rhonchi. She has no rales.  GI: Soft. Bowel sounds are normal. There is no tenderness.  Musculoskeletal:       Right ankle: She exhibits swelling.       Left ankle: She exhibits swelling.  Lymphadenopathy:    She has no cervical adenopathy.  Neurological: She is alert. No cranial nerve deficit.  Able to straight leg raise bilaterally.  Skin: Skin is  warm. No rash noted. Nails show no clubbing.  Psychiatric: She has a normal mood and affect.      Data Reviewed: Basic Metabolic Panel: Recent Labs  Lab 09/03/17 0423 09/04/17 0527 09/04/17 1702 09/05/17 0650 09/06/17 0430 09/07/17 0424 09/08/17 0353  NA 137 138  --  140 140 140 140  K 3.2* 2.6* 3.6 3.9 3.7 3.3* 3.5  CL 99* 101  --  101 98* 100 101  CO2 26 26  --  27 27 28 28   GLUCOSE 127* 137*  --  108* 118* 123* 104*  BUN 34* 22*  --  38* 51* 34* 41*  CREATININE 3.72* 2.74*  --  4.30* 5.69* 4.37* 5.41*  CALCIUM 8.1* 8.1*  --  8.6* 8.3* 8.2* 8.2*  MG 1.9  --   --   --   --   --   --   PHOS 4.1  4.2 3.7  --  5.8* 7.8* 5.9* 7.3*   Liver Function Tests: Recent Labs  Lab 09/04/17 0527 09/05/17 0650 09/06/17 0430 09/07/17 0424 09/08/17 0353  ALBUMIN 2.4* 2.6* 2.5* 2.3* 2.5*   CBC: Recent Labs  Lab 09/03/17 0423 09/04/17 0527 09/05/17 0650 09/06/17 0431 09/06/17 1017 09/06/17 1525 09/07/17 0424 09/07/17 1940 09/08/17 0353  WBC 11.1* 11.3* 9.7 9.2  --   --  5.8  --  6.5  NEUTROABS 9.1*  --   --   --   --   --  4.3  --  4.6  HGB 8.3* 9.0* 9.1* 8.2* 7.8* 7.7* 5.9* 9.9* 10.1*  HCT 24.3* 26.3* 26.5* 24.4* 23.5* 23.3* 17.8* 28.7* 29.1*  MCV 91.7 91.8 92.6 93.4  --   --  93.7  --  91.2  PLT 102* 120* 153 150  --   --  125*  --  154   Is CBG: Recent Labs  Lab 09/07/17 1950 09/08/17 0000 09/08/17 0423 09/08/17 0801 09/08/17 1145  GLUCAP 109* 125* 91 105* 139*    Recent Results (from the past 240 hour(s))  Culture, blood (Routine X 2) w Reflex to ID Panel     Status: None   Collection Time: 08/29/17  9:31 PM  Result Value Ref Range Status   Specimen Description BLOOD LEFT FOOT  Final   Special Requests   Final    BOTTLES DRAWN AEROBIC AND ANAEROBIC Blood Culture adequate volume   Culture   Final    NO GROWTH 5 DAYS Performed at [facility], [address] [place], [place] [address]    Report Status 09/03/2017 FINAL  Final  Culture, blood  (Routine X 2) w Reflex to ID Panel     Status: None   Collection Time: 08/29/17  9:50 PM  Result Value Ref Range Status   Specimen Description BLOOD RIGHT ANKLE  Final   Special Requests   Final    BOTTLES DRAWN AEROBIC AND ANAEROBIC Blood Culture adequate volume   Culture   Final    NO GROWTH 5 DAYS Performed at [facility], [address], [place], [place] [address]    Report Status 09/03/2017 FINAL  Final  C difficile quick scan w PCR reflex     Status: None   Collection Time: 09/02/17 12:31 PM  Result Value Ref Range Status   C Diff antigen NEGATIVE NEGATIVE Final   C Diff toxin NEGATIVE NEGATIVE Final   C Diff interpretation No C. difficile detected.  Final    Comment: Performed at [facility], [address] [place], [place] [address]  CULTURE, BLOOD (ROUTINE X 2) w Reflex to ID Panel     Status: None   Collection Time: 09/02/17  7:25 PM  Result Value Ref Range Status   Specimen Description BLOOD BLOOD RIGHT HAND  Final   Special Requests   Final    BOTTLES DRAWN AEROBIC AND ANAEROBIC Blood Culture adequate volume   Culture   Final    NO GROWTH 5 DAYS Performed at [facility], [address] [place], [place] [address]    Report Status 09/07/2017 FINAL  Final  CULTURE, BLOOD (ROUTINE X 2) w Reflex to ID Panel     Status: None   Collection Time: 09/02/17  7:25 PM  Result Value Ref Range Status   Specimen Description BLOOD BLOOD RIGHT HAND  Final   Special Requests   Final    BOTTLES DRAWN AEROBIC AND ANAEROBIC Blood Culture results may not be optimal due to an inadequate volume of blood received in culture bottles   Culture   Final    NO GROWTH 5 DAYS Performed at [facility], [address], [place], [place] [address]    Report Status 09/07/2017 FINAL  Final  Urine Culture     Status: Abnormal   Collection Time: 09/03/17  4:23 AM  Result Value Ref Range Status   Specimen Description   Final    URINE, RANDOM Performed at  [facility], [address],  [place], [place] [address]    Special Requests   Final    NONE Performed at [facility], [address] [place], [place] [address]    Culture (A)  Final    >=100,000 COLONIES/mL DIPHTHEROIDS(CORYNEBACTERIUM SPECIES) Standardized susceptibility testing for this organism is not available. Performed at [facility], [place] [address], [place], [place] [address]    Report Status 09/04/2017 FINAL  Final      Scheduled Meds: . alum & mag hydroxide-simeth  15 mL Oral Once  . bisacodyl  10 mg Rectal Once  . chlorhexidine  15 mL Mouth Rinse BID  . Chlorhexidine Gluconate Cloth  6 each Topical Q0600  . insulin aspart  0-15 Units Subcutaneous Q4H  . insulin aspart  3 Units Subcutaneous Q4H  . insulin glargine  10 Units Subcutaneous Daily  . letrozole  2.5 mg Oral Daily  . mouth rinse  15 mL Mouth Rinse q12n4p  . multivitamin  1 tablet Oral QHS  . nystatin cream   Topical BID  . pantoprazole  40 mg Intravenous Q12H  . sodium chloride flush  10-40 mL Intracatheter Q12H   Continuous Infusions: . amiodarone 30 mg/hr (09/07/17 2050)    Assessment/Plan:  1. Sepsis with multiorgan failure and initial hypotension.  Likely combination of urinary tract infection versus tickborne disease.  Finished antibiotics. 2. Acute hypoxic respiratory failure with fluid overload.  Patient extubated on last Thursday.  Tapered down on nasal cannula. 3. Acute blood loss anemia with upper GI bleed.  Endoscopy showing irritation from NG tube.  NG tube removed.  Patient on IV Protonix.  Hemoglobin responded well to transfusion.  Last hemoglobin 10.1.. 4. Acute kidney injury.  ATN from sepsis and hypotension.  Hemodialysis yesterday.  Making urine.  Foley had to be placed for urinary retention.  Today's creatinine 5.41.  Nephrology considering PermCath placement and then they can consider whether or not dialysis is needed as outpatient. 5. Diarrhea.  Patient has  rectal tube in.  Patient received medications with the illness previously. 6. Elevated troponin demand ischemia in the setting of sepsis. 7. Atrial fibrillation with rapid ventricular response.  On amiodarone drip.  Currently in normal sinus rhythm. 8. Thrombocytopenia secondary to sepsis.  last platelet count 154.  9. History of breast cancer on Femara 10. Morbid obesity weight loss needed 11. History of sleep apnea on BiPAP at home  12. type 2 diabetes on sliding scale 13. Weakness.  Physical therapy needed daily to get her stronger.  Code Status:     Code Status Orders  (From admission, onward)        Start     Ordered   08/22/17 1612  Full code  Continuous     08/22/17 1611    Code Status History    This patient has a current code status but no historical code status.     Family Communication: Husband at the bedside Disposition Plan: To be determined based on clinical course  Consultants:  Critical care specialist  Nephrology  Time spent: 26 minutes  [place]

## 2017-09-08 NOTE — Progress Notes (Signed)
[facility]  ROUNDING NOTE   Subjective:   Husband at bedside.   2 units PRBC yesterday after endoscopy   UOP228mL  Objective:  Vital signs in last 24 hours:  Temp:  [97.5 F (36.4 C)-98.8 F (37.1 C)] 97.9 F (36.6 C) (06/26 0200) Pulse Rate:  [72-103] 78 (06/26 0600) Resp:  [13-25] 21 (06/26 0600) BP: (123-168)/(49-91) 149/49 (06/26 0600) SpO2:  [82 %-100 %] 95 % (06/26 0600) Weight:  [106.1 kg (233 lb 14.5 oz)] 106.1 kg (233 lb 14.5 oz) (06/26 0352)  Weight change: -6.8 kg (-14 lb 15.9 oz) Filed Weights   09/06/17 2005 09/07/17 0416 09/08/17 0352  Weight: 110.7 kg (244 lb 0.8 oz) 105.3 kg (232 lb 2.3 oz) 106.1 kg (233 lb 14.5 oz)    Intake/Output: I/O last 3 completed shifts: In: 4016.6 [P.O.:480; I.V.:1451.6; Blood:1085; NG/GT:1000] Out: 7858 [Urine:810; Emesis/NG output:1450; IFOYD:7412; Stool:350]   Intake/Output this shift:  Total I/O In: -  Out: 75 [Urine:75]  Physical Exam: General: Ill appearing  Head: NG tube to suction  Eyes: Anicteric, PERRL  Neck: Supple, trachea midline  Lungs:  Bases diminished, 5 L [place]  Heart: Irregular rhythm  Abdomen:  Obese, nontender  Extremities: 1+ peripheral edema.  Neurologic: Nonfocal, moving all four extremities  Skin: No lesions  Access: Left IJ temp HD catheter    Basic Metabolic Panel: Recent Labs  Lab 09/03/17 0423 09/04/17 0527 09/04/17 1702 09/05/17 0650 09/06/17 0430 09/07/17 0424 09/08/17 0353  NA 137 138  --  140 140 140 140  K 3.2* 2.6* 3.6 3.9 3.7 3.3* 3.5  CL 99* 101  --  101 98* 100 101  CO2 26 26  --  27 27 28 28   GLUCOSE 127* 137*  --  108* 118* 123* 104*  BUN 34* 22*  --  38* 51* 34* 41*  CREATININE 3.72* 2.74*  --  4.30* 5.69* 4.37* 5.41*  CALCIUM 8.1* 8.1*  --  8.6* 8.3* 8.2* 8.2*  MG 1.9  --   --   --   --   --   --   PHOS 4.1  4.2 3.7  --  5.8* 7.8* 5.9* 7.3*    Liver Function Tests: Recent Labs  Lab 09/04/17 0527 09/05/17 0650 09/06/17 0430 09/07/17 0424  09/08/17 0353  ALBUMIN 2.4* 2.6* 2.5* 2.3* 2.5*   No results for input(s): LIPASE, AMYLASE in the last 168 hours. No results for input(s): AMMONIA in the last 168 hours.  CBC: Recent Labs  Lab 09/03/17 0423 09/04/17 0527 09/05/17 0650 09/06/17 0431 09/06/17 1017 09/06/17 1525 09/07/17 0424 09/07/17 1940 09/08/17 0353  WBC 11.1* 11.3* 9.7 9.2  --   --  5.8  --  6.5  NEUTROABS 9.1*  --   --   --   --   --  4.3  --  4.6  HGB 8.3* 9.0* 9.1* 8.2* 7.8* 7.7* 5.9* 9.9* 10.1*  HCT 24.3* 26.3* 26.5* 24.4* 23.5* 23.3* 17.8* 28.7* 29.1*  MCV 91.7 91.8 92.6 93.4  --   --  93.7  --  91.2  PLT 102* 120* 153 150  --   --  125*  --  154    Cardiac Enzymes: No results for input(s): CKTOTAL, CKMB, CKMBINDEX, TROPONINI in the last 168 hours.  BNP: Invalid input(s): POCBNP  CBG: Recent Labs  Lab 09/07/17 1638 09/07/17 1950 09/08/17 0000 09/08/17 0423 09/08/17 0801  GLUCAP 134* 109* 125* 91 105*    Microbiology: Results for orders placed or performed  during the hospital encounter of 08/22/17  Blood Culture (routine x 2)     Status: None   Collection Time: 08/22/17 12:52 PM  Result Value Ref Range Status   Specimen Description BLOOD RIGHT HAND  Final   Special Requests   Final    BOTTLES DRAWN AEROBIC AND ANAEROBIC Blood Culture adequate volume   Culture   Final    NO GROWTH 5 DAYS Performed at [facility], [address] [place], [place] [address]    Report Status 08/27/2017 FINAL  Final  Urine culture     Status: Abnormal   Collection Time: 08/22/17 12:52 PM  Result Value Ref Range Status   Specimen Description   Final    URINE, RANDOM Performed at [facility], [address], [place], [place] [address]    Special Requests   Final    NONE Performed at [facility], [address], [place], [place] [address]    Culture (A)  Final    >=100,000 COLONIES/mL KLEBSIELLA PNEUMONIAE 60,000 COLONIES/mL PROTEUS MIRABILIS    Report Status  08/24/2017 FINAL  Final   Organism ID, Bacteria KLEBSIELLA PNEUMONIAE (A)  Final   Organism ID, Bacteria PROTEUS MIRABILIS (A)  Final      Susceptibility   Klebsiella pneumoniae - MIC*    AMPICILLIN RESISTANT Resistant     CEFAZOLIN <=4 SENSITIVE Sensitive     CEFTRIAXONE <=1 SENSITIVE Sensitive     CIPROFLOXACIN <=0.25 SENSITIVE Sensitive     GENTAMICIN <=1 SENSITIVE Sensitive     IMIPENEM <=0.25 SENSITIVE Sensitive     NITROFURANTOIN 64 INTERMEDIATE Intermediate     TRIMETH/SULFA <=20 SENSITIVE Sensitive     AMPICILLIN/SULBACTAM 4 SENSITIVE Sensitive     PIP/TAZO <=4 SENSITIVE Sensitive     Extended ESBL NEGATIVE Sensitive     * >=100,000 COLONIES/mL KLEBSIELLA PNEUMONIAE   Proteus mirabilis - MIC*    AMPICILLIN <=2 SENSITIVE Sensitive     CEFAZOLIN <=4 SENSITIVE Sensitive     CEFTRIAXONE <=1 SENSITIVE Sensitive     CIPROFLOXACIN <=0.25 SENSITIVE Sensitive     GENTAMICIN <=1 SENSITIVE Sensitive     IMIPENEM 1 SENSITIVE Sensitive     NITROFURANTOIN 128 RESISTANT Resistant     TRIMETH/SULFA <=20 SENSITIVE Sensitive     AMPICILLIN/SULBACTAM <=2 SENSITIVE Sensitive     PIP/TAZO <=4 SENSITIVE Sensitive     * 60,000 COLONIES/mL PROTEUS MIRABILIS  Blood Culture (routine x 2)     Status: None   Collection Time: 08/22/17 12:53 PM  Result Value Ref Range Status   Specimen Description BLOOD RIGHT ARM  Final   Special Requests   Final    BOTTLES DRAWN AEROBIC AND ANAEROBIC Blood Culture results may not be optimal due to an excessive volume of blood received in culture bottles   Culture   Final    NO GROWTH 5 DAYS Performed at [facility], [address] [place], [place] [address]    Report Status 08/27/2017 FINAL  Final  MRSA PCR Screening     Status: None   Collection Time: 08/22/17  9:18 PM  Result Value Ref Range Status   MRSA by PCR NEGATIVE NEGATIVE Final    Comment:        The GeneXpert MRSA Assay (FDA approved for NASAL specimens only), is one component of  a comprehensive MRSA colonization surveillance program. It is not intended to diagnose MRSA infection nor to guide or monitor treatment for MRSA infections. Performed at [facility], [phone] [place], [place] [address]   Culture, blood (Routine X 2) w Reflex to ID Panel     Status: None   Collection Time: 08/29/17  9:31 PM  Result Value Ref Range Status   Specimen Description BLOOD LEFT FOOT  Final   Special Requests   Final    BOTTLES DRAWN AEROBIC AND ANAEROBIC Blood Culture adequate volume   Culture   Final    NO GROWTH 5 DAYS Performed at [facility], [address] [place], [place] [address]    Report Status 09/03/2017 FINAL  Final  Culture, blood (Routine X 2) w Reflex to ID Panel     Status: None   Collection Time: 08/29/17  9:50 PM  Result Value Ref Range Status   Specimen Description BLOOD RIGHT ANKLE  Final   Special Requests   Final    BOTTLES DRAWN AEROBIC AND ANAEROBIC Blood Culture adequate volume   Culture   Final    NO GROWTH 5 DAYS Performed at [facility], [address] [place], [place] [address]    Report Status 09/03/2017 FINAL  Final  C difficile quick scan w PCR reflex     Status: None   Collection Time: 09/02/17 12:31 PM  Result Value Ref Range Status   C Diff antigen NEGATIVE NEGATIVE Final   C Diff toxin NEGATIVE NEGATIVE Final   C Diff interpretation No C. difficile detected.  Final    Comment: Performed at [facility], [address] [place], [place] [address]  CULTURE, BLOOD (ROUTINE X 2) w Reflex to ID Panel     Status: None   Collection Time: 09/02/17  7:25 PM  Result Value Ref Range Status   Specimen Description BLOOD BLOOD RIGHT HAND  Final   Special Requests   Final    BOTTLES DRAWN AEROBIC AND ANAEROBIC Blood Culture adequate volume   Culture   Final    NO GROWTH 5 DAYS Performed at [facility], [address] [place], [place] [address]    Report Status 09/07/2017 FINAL   Final  CULTURE, BLOOD (ROUTINE X 2) w Reflex to ID Panel     Status: None   Collection Time: 09/02/17  7:25 PM  Result Value Ref Range Status   Specimen Description BLOOD BLOOD RIGHT HAND  Final   Special Requests   Final    BOTTLES DRAWN AEROBIC AND ANAEROBIC Blood Culture results may not be optimal due to an inadequate volume of blood received in culture bottles   Culture   Final    NO GROWTH 5 DAYS Performed at [facility], [address], [place], [place] [address]    Report Status 09/07/2017 FINAL  Final  Urine Culture     Status: Abnormal   Collection Time: 09/03/17  4:23 AM  Result Value Ref Range Status   Specimen Description   Final    URINE, RANDOM Performed at [facility], [address] [place], [place] [address]    Special Requests   Final    NONE Performed at [facility], [address] [place], [place] [address]    Culture (A)  Final    >=100,000 COLONIES/mL DIPHTHEROIDS(CORYNEBACTERIUM SPECIES) Standardized susceptibility testing for this organism is not available. Performed at [facility], [place] [address] [place], [place] [address]    Report Status 09/04/2017 FINAL  Final    Coagulation Studies: No results for input(s): LABPROT, INR in the last 72 hours.  Urinalysis:  No results for input(s): COLORURINE, LABSPEC, [place], GLUCOSEU, HGBUR, BILIRUBINUR, KETONESUR, PROTEINUR, UROBILINOGEN, NITRITE, LEUKOCYTESUR in the last 72 hours.  Invalid input(s): APPERANCEUR    Imaging: Dg Abd 1 View  Result Date: 09/06/2017 CLINICAL DATA:  Ileus and abdominal pain. Recent bowel movement after an enema. EXAM: ABDOMEN - 1 VIEW COMPARISON:  Abdominal radiograph of September 05, 2017 FINDINGS: There is a moderate amount of gas within normal caliber:. The a few loops of nondistended gas-filled small bowel are evident. No free extraluminal gas collections are observed. An esophagogastric tube is present whose tip in proximal port project  over the gastric body. There is an ovoid calcification measuring approximately 1.5 x 2 cm in the right upper quadrant which may reflect a gallstone. The bony structures exhibit no acute abnormality. There is a radiodense structure in the projecting over the lower pelvis similar to that seen on yesterday's study which is of uncertain significance. IMPRESSION: Moderate amount of gas within large and small bowel which may reflect residual mild ileus. The stool burden is not excessive. No evidence of obstruction or perforation. Probable gallstone. The esophagogastric tube is in reasonable position. Faintly radiodense cylindrical appearing structure projecting over the lower pelvis in the midline similar to that seen on yesterday's study. Electronically Signed   By: David  Martinique M.D.   On: 09/06/2017 14:20   Dg Chest Port 1 View  Result Date: 09/07/2017 CLINICAL DATA:  PICC placement. EXAM: PORTABLE CHEST 1 VIEW COMPARISON:  09/02/2017. FINDINGS: Right PICC tip poorly visualized in the region of the confluence of the innominate veins. Stable left jugular double-lumen catheter. The endotracheal and nasogastric tubes have been removed. Stable enlarged cardiac silhouette. Increased prominence of the pulmonary vasculature and interstitial markings. No significant change in a small to moderate-sized left pleural effusion with left basilar patchy opacity. Mild increase in size of a small right pleural effusion with interval patchy opacity at the right lung base. Thoracic spine degenerative changes. IMPRESSION: 1. Right PICC tip in the region of the origin of the superior vena cava. This would need to be advanced 7 cm to place it at the superior cavoatrial junction. 2. Progressive pulmonary vascular congestion with interval interstitial pulmonary edema with a probable of the lower component in the lower lung zones. 3. Stable left pleural effusion and left basilar pneumonia or patchy atelectasis. 4. Slight increase in size  of a small right pleural effusion with interval mild right basilar alveolar edema, pneumonia or patchy atelectasis. Electronically Signed   By: Claudie Revering M.D.   On: 09/07/2017 16:46     Medications:   . amiodarone 30 mg/hr (09/07/17 2050)  . dextrose 5 % and 0.45% NaCl 35 mL/hr (09/07/17 1803)  . pantoprozole (PROTONIX) infusion 8 mg/hr (09/07/17 2201)   . alum & mag hydroxide-simeth  15 mL Oral Once  . bisacodyl  10 mg Rectal Once  . chlorhexidine  15 mL Mouth Rinse BID  . Chlorhexidine Gluconate Cloth  6 each Topical Q0600  . insulin aspart  0-15 Units Subcutaneous Q4H  . insulin aspart  3 Units Subcutaneous Q4H  . insulin glargine  10 Units Subcutaneous Daily  . letrozole  2.5 mg Oral Daily  . mouth rinse  15 mL Mouth Rinse q12n4p  . multivitamin  1 tablet Oral QHS  . nystatin cream   Topical BID  . [START ON 09/10/2017] pantoprazole  40 mg Intravenous Q12H  . sodium chloride flush  10-40 mL Intracatheter Q12H   acetaminophen **OR** acetaminophen, bisacodyl, hydrALAZINE,  ipratropium-albuterol, metoprolol tartrate, [DISCONTINUED] ondansetron **OR** ondansetron (ZOFRAN) IV, sodium chloride flush  Assessment/ Plan:  Ms. JAKHIYA BROWER is a 72 y.o. white female with diabetes mellitus type II, coronary artery disease, obstructive sleep apnea, history of breast cancer, was admitted on6/9/2019with fever, chills, tick bite.  1. Acute renal failure on chronic kidney disease stage III Baseline creatinine of 1.1, GFR of 49 on 06/01/17  Requiring hemodialysis. Last hemodialysis treatment was 6/24.  Nonoliguric urine output.  Monitor daily for dialysis need.  No acute indication for dialysis today.   2. Sepsis with Urinary tract infection and recent tick bite (concern for tick borne illness). Urine culture with corynebacterium species. Completed course of antibiotics.  Off vasopressors.   3. Anemia with renal failure: now with peptic ulcer disease/GIB and acute blood loss. Status  post endoscopy with clipping of gastric vessels by Dr. Allen Norris on 6/25  Status post 2 units PRBC transfusion 6/25.    LOS: 17 [place] Einstein 6/26/20198:24 AM

## 2017-09-08 NOTE — Progress Notes (Signed)
Patient confused and attempting to remove Bi-Pap and pulling at PICC line site. Safety mitts applied to patient after redirection was unsuccessful.

## 2017-09-09 ENCOUNTER — Encounter
Admission: EM | Disposition: A | Discharge status: Skilled Nursing Facility | Payer: Self-pay | Source: Home / Self Care | Attending: Specialist

## 2017-09-09 DIAGNOSIS — N189 Chronic kidney disease, unspecified: Secondary | ICD-10-CM

## 2017-09-09 DIAGNOSIS — N186 End stage renal disease: Secondary | ICD-10-CM

## 2017-09-09 HISTORY — PX: DIALYSIS/PERMA CATHETER INSERTION: CATH118288

## 2017-09-09 LAB — GLUCOSE, CAPILLARY
GLUCOSE-CAPILLARY: 129 mg/dL — AB (ref 70–99)
GLUCOSE-CAPILLARY: 233 mg/dL — AB (ref 70–99)
Glucose-Capillary: 107 mg/dL — ABNORMAL HIGH (ref 70–99)
Glucose-Capillary: 111 mg/dL — ABNORMAL HIGH (ref 70–99)
Glucose-Capillary: 140 mg/dL — ABNORMAL HIGH (ref 70–99)
Glucose-Capillary: 209 mg/dL — ABNORMAL HIGH (ref 70–99)
Glucose-Capillary: 67 mg/dL — ABNORMAL LOW (ref 70–99)
Glucose-Capillary: 94 mg/dL (ref 70–99)

## 2017-09-09 LAB — RENAL FUNCTION PANEL
ALBUMIN: 2.4 g/dL — AB (ref 3.5–5.0)
Anion gap: 11 (ref 5–15)
BUN: 48 mg/dL — ABNORMAL HIGH (ref 8–23)
CALCIUM: 8.6 mg/dL — AB (ref 8.9–10.3)
CO2: 26 mmol/L (ref 22–32)
Chloride: 102 mmol/L (ref 98–111)
Creatinine, Ser: 6.4 mg/dL — ABNORMAL HIGH (ref 0.44–1.00)
GFR, EST AFRICAN AMERICAN: 7 mL/min — AB (ref >60)
GFR, EST NON AFRICAN AMERICAN: 6 mL/min — AB (ref >60)
Glucose, Bld: 143 mg/dL — ABNORMAL HIGH (ref 70–99)
Phosphorus: 6.9 mg/dL — ABNORMAL HIGH (ref 2.5–4.6)
Potassium: 3.3 mmol/L — ABNORMAL LOW (ref 3.5–5.1)
SODIUM: 139 mmol/L (ref 135–145)

## 2017-09-09 LAB — CBC
HEMATOCRIT: 28.5 % — AB (ref 35.0–47.0)
HEMOGLOBIN: 9.6 g/dL — AB (ref 12.0–16.0)
MCH: 31.2 pg (ref 26.0–34.0)
MCHC: 33.8 g/dL (ref 32.0–36.0)
MCV: 92.3 fL (ref 80.0–100.0)
Platelets: 163 10*3/uL (ref 150–440)
RBC: 3.09 MIL/uL — AB (ref 3.80–5.20)
RDW: 16.5 % — ABNORMAL HIGH (ref 11.5–14.5)
WBC: 5.8 10*3/uL (ref 3.6–11.0)

## 2017-09-09 SURGERY — DIALYSIS/PERMA CATHETER INSERTION
Anesthesia: Moderate Sedation

## 2017-09-09 MED ORDER — PANTOPRAZOLE SODIUM 40 MG PO TBEC
40.0000 mg | DELAYED_RELEASE_TABLET | Freq: Every day | ORAL | Status: DC
  Administered 2017-09-10 – 2017-09-15 (×6): 40 mg via ORAL
  Filled 2017-09-09 (×7): qty 1

## 2017-09-09 MED ORDER — FENTANYL CITRATE (PF) 100 MCG/2ML IJ SOLN
INTRAMUSCULAR | Status: DC | PRN
  Administered 2017-09-09: 50 ug via INTRAVENOUS

## 2017-09-09 MED ORDER — SODIUM CHLORIDE 0.9 % IV SOLN: INTRAVENOUS | Status: DC

## 2017-09-09 MED ORDER — LIDOCAINE-EPINEPHRINE (PF) 1 %-1:200000 IJ SOLN
INTRAMUSCULAR | Status: AC
  Filled 2017-09-09: qty 30

## 2017-09-09 MED ORDER — DEXTROSE 5 % IV SOLN
1000.0000 mg | Freq: Once | INTRAVENOUS | Status: AC
  Administered 2017-09-09: 1000 mg via INTRAVENOUS
  Filled 2017-09-09: qty 10

## 2017-09-09 MED ORDER — MIDAZOLAM HCL 2 MG/2ML IJ SOLN
INTRAMUSCULAR | Status: DC | PRN
  Administered 2017-09-09: 2 mg via INTRAVENOUS

## 2017-09-09 MED ORDER — NEPRO/CARBSTEADY PO LIQD
237.0000 mL | Freq: Two times a day (BID) | ORAL | Status: DC
  Administered 2017-09-10 – 2017-09-14 (×8): 237 mL via ORAL

## 2017-09-09 MED ORDER — INSULIN ASPART 100 UNIT/ML ~~LOC~~ SOLN
0.0000 [IU] | Freq: Every day | SUBCUTANEOUS | Status: DC
  Administered 2017-09-09 – 2017-09-14 (×4): 2 [IU] via SUBCUTANEOUS
  Filled 2017-09-09 (×5): qty 1

## 2017-09-09 MED ORDER — EPOETIN ALFA 10000 UNIT/ML IJ SOLN
10000.0000 [IU] | INTRAMUSCULAR | Status: DC
  Administered 2017-09-09: 10000 [IU] via INTRAVENOUS
  Filled 2017-09-09: qty 1

## 2017-09-09 MED ORDER — HEPARIN (PORCINE) IN NACL 1000-0.9 UT/500ML-% IV SOLN
INTRAVENOUS | Status: AC
  Filled 2017-09-09: qty 500

## 2017-09-09 MED ORDER — HEPARIN SODIUM (PORCINE) 10000 UNIT/ML IJ SOLN
INTRAMUSCULAR | Status: AC
  Filled 2017-09-09: qty 1

## 2017-09-09 MED ORDER — INSULIN ASPART 100 UNIT/ML ~~LOC~~ SOLN
0.0000 [IU] | Freq: Three times a day (TID) | SUBCUTANEOUS | Status: DC
  Administered 2017-09-10: 3 [IU] via SUBCUTANEOUS
  Administered 2017-09-10: 2 [IU] via SUBCUTANEOUS
  Administered 2017-09-11: 5 [IU] via SUBCUTANEOUS
  Administered 2017-09-11 – 2017-09-12 (×3): 2 [IU] via SUBCUTANEOUS
  Administered 2017-09-12 (×2): 3 [IU] via SUBCUTANEOUS
  Administered 2017-09-13: 2 [IU] via SUBCUTANEOUS
  Administered 2017-09-13 (×2): 3 [IU] via SUBCUTANEOUS
  Administered 2017-09-14: 08:00:00 2 [IU] via SUBCUTANEOUS
  Administered 2017-09-14: 5 [IU] via SUBCUTANEOUS
  Administered 2017-09-14: 18:00:00 2 [IU] via SUBCUTANEOUS
  Administered 2017-09-15 (×2): 3 [IU] via SUBCUTANEOUS
  Filled 2017-09-09 (×15): qty 1

## 2017-09-09 MED ORDER — MIDAZOLAM HCL 5 MG/5ML IJ SOLN
INTRAMUSCULAR | Status: AC
  Filled 2017-09-09: qty 5

## 2017-09-09 MED ORDER — FENTANYL CITRATE (PF) 100 MCG/2ML IJ SOLN
INTRAMUSCULAR | Status: AC
  Filled 2017-09-09: qty 2

## 2017-09-09 SURGICAL SUPPLY — 5 items
BIOPATCH RED 1 DISK 7.0 (GAUZE/BANDAGES/DRESSINGS) ×2 IMPLANT
CATH PALINDROME RT-P 15FX19CM (CATHETERS) ×2 IMPLANT
PACK ANGIOGRAPHY (CUSTOM PROCEDURE TRAY) ×2 IMPLANT
SUT MNCRL AB 4-0 PS2 18 (SUTURE) ×2 IMPLANT
SUT PROLENE 0 CT 1 30 (SUTURE) ×2 IMPLANT

## 2017-09-09 NOTE — H&P (Signed)
[facility] History & Physical Update  The patient was interviewed and re-examined.  The patient's previous History and Physical has been reviewed and is unchanged.  There is no change in the plan of care. We plan to proceed with the scheduled procedure.  Leotis Pain, MD  09/09/2017, 4:50 PM

## 2017-09-09 NOTE — Consult Note (Signed)
   [facility] CM Inpatient Consult   09/09/2017  Bonnie Holt 08/08/1945 [id number]   Chart review revealed patient eligible for [place] Management services and post hospital discharge follow up related to a diagnosis of DM, and Sepsis. Patient was evaluated for community based chronic disease management services with [facility] care Management Program as a benefit of patient's [employer] Medicare. Met with the patient's spouse at the bedside to explain [place] Management services. Patient was having a procedure. Spouse explained patient had been extremely sick and he felt he was going to lose her a couple of times during this illness. Spouse stated patient is very weak but he has seen much improvement. Spouse stated the discharge plan is for patient to go to SNF first and then home.   Verbal consent received by spouse for services. Spouse [name] gave [phone] as the best number to reach them. Patient will receive post hospital discharge calls and be evaluated for monthly home visits by [facility]. Patient will receive a visit from [facility] while in SNF to assist with discharge planning. [facility] Care Management services do not interfere with or replace any services arranged by the inpatient care management team. RNCM left contact information and [facility] literature at the bedside. Made inpatient social worker aware  [facility] will be following for care management. For additional questions please contact:   Damaris Geers RN, [facility] Liaison  [phone]) Business Mobile [phone]) Toll free office

## 2017-09-09 NOTE — Plan of Care (Signed)
  Problem: Clinical Measurements: Goal: Diagnostic test results will improve Outcome: Progressing   Problem: Respiratory: Goal: Ability to maintain adequate ventilation will improve Outcome: Progressing

## 2017-09-09 NOTE — Progress Notes (Signed)
PT Cancellation Note  Patient Details Name: Bonnie Holt MRN: [id number] DOB: [birth year]   Cancelled Treatment:    Reason Eval/Treat Not Completed: Other (comment). Pt currently in HD at this time, unavailable for treatment. Will re-attempt next date.   Yenty Bloch 09/09/2017, 1:16 PM  Greggory Stallion, PT, DPT [phone]

## 2017-09-09 NOTE — Op Note (Signed)
OPERATIVE NOTE    PRE-OPERATIVE DIAGNOSIS: 1. ESRD   POST-OPERATIVE DIAGNOSIS: same as above  PROCEDURE: 1. Ultrasound guidance for vascular access to the right internal jugular vein 2. Fluoroscopic guidance for placement of catheter 3. Placement of a 19 cm tip to cuff tunneled hemodialysis catheter via the right internal jugular vein  SURGEON: Leotis Pain, MD  ASSISTANT: Hezzie Bump, PA-C  ANESTHESIA:  Local with Moderate conscious sedation for approximately 25 minutes using 2 mg of Versed and 50 mcg of Fentanyl  ESTIMATED BLOOD LOSS: 15 cc  FLUORO TIME: less than one minute  CONTRAST: none  FINDING(S): 1.  Patent right internal jugular vein  SPECIMEN(S):  None  INDICATIONS:   Bonnie Holt is a 72 y.o.female who presents with renal failure after sepsis and MSOF.  The patient needs long term dialysis access for their ESRD, and a Permcath is necessary.  Risks and benefits are discussed and informed consent is obtained.    DESCRIPTION: After obtaining full informed written consent, the patient was brought back to the vascular suited. The patient's right neck and chest were sterilely prepped and draped in a sterile surgical field was created. Moderate conscious sedation was administered during a face to face encounter with the patient throughout the procedure with my supervision of the RN administering medicines and monitoring the patient's vital signs, pulse oximetry, telemetry and mental status throughout from the start of the procedure until the patient was taken to the recovery room.  The right internal jugular vein was visualized with ultrasound and found to be patent. It was then accessed under direct ultrasound guidance and a permanent image was recorded. A wire was placed. After skin nick and dilatation, the peel-away sheath was placed over the wire. I then turned my attention to an area under the clavicle. Approximately 1-2 fingerbreadths below the clavicle a small  counterincision was created and tunneled from the subclavicular incision to the access site. Using fluoroscopic guidance, a 19 centimeter tip to cuff tunneled hemodialysis catheter was selected, and tunneled from the subclavicular incision to the access site. It was then placed through the peel-away sheath and the peel-away sheath was removed. Using fluoroscopic guidance the catheter tips were parked in the right atrium. The appropriate distal connectors were placed. It withdrew blood well and flushed easily with heparinized saline and a concentrated heparin solution was then placed. It was secured to the chest wall with 2 Prolene sutures. The access incision was closed single 4-0 Monocryl. A 4-0 Monocryl pursestring suture was placed around the exit site. Sterile dressings were placed. The patient tolerated the procedure well and was taken to the recovery room in stable condition.  COMPLICATIONS: None  CONDITION: Stable  Leotis Pain, MD 09/09/2017 5:18 PM   This note was created with Dragon Medical transcription system. Any errors in dictation are purely unintentional.

## 2017-09-09 NOTE — Progress Notes (Signed)
   09/09/17 1425  Clinical Encounter Type  Visited With Family;Patient  Visit Type Follow-up  Spiritual Encounters  Spiritual Needs Emotional   Patient's spouse expressed feelings of hopefulness with patient's transfer to new unit.  He reported some concerns regarding managing household finances as patient has the passwords and bills are coming due; he plans to talk with her today to get the information he needs.  Spouse expressed appreciation for his family/support system but says that there's nothing like having his partner with him.  Patient returned from dialysis, holding her head and vocalizing that she had a headache.  Chaplain left to allow patient and spouse time alone together.

## 2017-09-09 NOTE — Progress Notes (Signed)
HD tx end   09/09/17 1423  Vital Signs  Pulse Rate 72  Pulse Rate Source Monitor  Resp 15  BP (!) 140/106  BP Location Right Arm  BP Method Automatic  Patient Position (if appropriate) Lying  Oxygen Therapy  SpO2 100 %  O2 Device Nasal Cannula  O2 Flow Rate (L/min) 2 L/min  During Hemodialysis Assessment  Dialysis Fluid Bolus Normal Saline  Bolus Amount (mL) 250 mL  Intra-Hemodialysis Comments Tx completed

## 2017-09-09 NOTE — Progress Notes (Signed)
Notified MD of blood sugar of 67. Orders placed. Will continue to monitor and assess.

## 2017-09-09 NOTE — Progress Notes (Signed)
Inpatient Diabetes Program Recommendations  AACE/ADA: New Consensus Statement on Inpatient Glycemic Control (2019)  Target Ranges:  Prepandial:   less than 140 mg/dL      Peak postprandial:   less than 180 mg/dL (1-2 hours)      Critically ill patients:  140 - 180 mg/dL   Results for Bonnie Holt, Bonnie Holt (MRN [id number]) as of 09/09/2017 10:05  Ref. Range 09/08/2017 08:01 09/08/2017 11:45 09/08/2017 16:35 09/08/2017 19:59 09/09/2017 00:48 09/09/2017 04:44 09/09/2017 05:27 09/09/2017 08:29  Glucose-Capillary Latest Ref Range: 70 - 99 mg/dL 105 (H) 139 (H) 93 140 (H) 140 (H) 67 (L) 111 (H) 129 (H)   Review of Glycemic Control  Current orders for Inpatient glycemic control: Lantus 10 units daily, Novolog 3 units Q4H for tube feeding coverage, Novolog 0-15 units Q4 hours  Inpatient Diabetes Program Recommendations:  Correction (SSI): If patient is tolerating diet, please consider changing CBGs and Novolog to ACHS. Insulin - Tube Feeding Coverage: Noted patient has diet ordered and no longer ordered tube feedings. Please discontinue Novolog 3 units Q4H which was intended to be for tube feeding coverage.  Thanks, Barnie Alderman, RN, MSN, CDE Diabetes Coordinator Inpatient Diabetes Program [phone] (Team Pager from 8am to 5pm)

## 2017-09-09 NOTE — Care Management Important Message (Signed)
Copy of signed IM left in patient's room.    

## 2017-09-09 NOTE — Progress Notes (Signed)
Post HD assessment. Pt tolerated tx well without c/o. Pt experienced a brief drop in BP and quickly recovered once UF was turned off for a few minutes. Net UF -42, goal not met, MD aware.    09/09/17 1430  Vital Signs  Temp 98.3 F (36.8 C)  Temp Source Oral  Pulse Rate 71  Pulse Rate Source Monitor  Resp 11  BP (!) 132/55  BP Location Right Arm  BP Method Automatic  Patient Position (if appropriate) Lying  Oxygen Therapy  SpO2 99 %  O2 Device Nasal Cannula  O2 Flow Rate (L/min) 2 L/min  Dialysis Weight  Weight 116.6 kg (257 lb)  Type of Weight Post-Dialysis  Post-Hemodialysis Assessment  Rinseback Volume (mL) 250 mL  KECN 67 V  Dialyzer Clearance Lightly streaked  Duration of HD Treatment -hour(s) 3 hour(s)  Hemodialysis Intake (mL) 500 mL  UF Total -Machine (mL) 458 mL  Net UF (mL) -42 mL  Tolerated HD Treatment Yes  Education / Care Plan  Dialysis Education Provided Yes  Documented Education in Care Plan Yes  Hemodialysis Catheter Left Internal jugular Triple-lumen  Placement Date/Time: 08/29/17 1400   Time Out: Correct patient;Correct site;Correct procedure  Maximum sterile barrier precautions: Hand hygiene;Sterile gloves;Cap;Large sterile sheet;Mask;Sterile gown  Site Prep: Chlorhexidine  Local Anesthetic: Inje...  Site Condition No complications  Blue Lumen Status Heparin locked  Red Lumen Status Heparin locked  Purple Lumen Status N/A  Catheter fill solution Heparin 1000 units/ml  Catheter fill volume (Arterial) 1.4 cc  Catheter fill volume (Venous) 1.4  Dressing Type Biopatch  Dressing Status Clean;Dry;Intact  Drainage Description None  Post treatment catheter status Capped and Clamped

## 2017-09-09 NOTE — Progress Notes (Signed)
Post HD assessment    09/09/17 1428  Neurological  Level of Consciousness Alert  Orientation Level Oriented X4  Respiratory  Respiratory Pattern Regular;Unlabored;Dyspnea with exertion  Chest Assessment Chest expansion symmetrical  Cardiac  ECG Monitor Yes  Vascular  R Radial Pulse +2  L Radial Pulse +2  Edema Generalized;Right upper extremity;Left upper extremity;Right lower extremity;Left lower extremity  Integumentary  Integumentary (WDL) X  Skin Color Appropriate for ethnicity;Red  Musculoskeletal  Musculoskeletal (WDL) X  Generalized Weakness Yes  Assistive Device None  GU Assessment  Genitourinary (WDL) X  Genitourinary Symptoms  (HD)  Psychosocial  Psychosocial (WDL) X  Patient Behaviors Cooperative;Calm;Anxious;Restless;Sad

## 2017-09-09 NOTE — Progress Notes (Signed)
Pre HD assessment    09/09/17 1113  Neurological  Level of Consciousness Alert  Orientation Level Oriented X4  Respiratory  Respiratory Pattern Regular;Unlabored;Dyspnea with exertion  Chest Assessment Chest expansion symmetrical  Cardiac  ECG Monitor Yes  Vascular  R Radial Pulse +2  L Radial Pulse +2  Edema Generalized;Right upper extremity;Left upper extremity;Right lower extremity;Left lower extremity  Integumentary  Integumentary (WDL) X  Skin Color Appropriate for ethnicity;Red  Musculoskeletal  Musculoskeletal (WDL) X  Generalized Weakness Yes  Assistive Device None  GU Assessment  Genitourinary (WDL) X  Genitourinary Symptoms  (HD)  Psychosocial  Psychosocial (WDL) WDL

## 2017-09-09 NOTE — Progress Notes (Signed)
Patient ID: Bonnie Holt, female   DOB: 12/31/1945, 72 y.o.   MRN: [id number] .   [facility] PROGRESS NOTE  Bonnie Holt PJA:[id number] DOB: 03/05/1946 DOA: 08/22/2017 PCP: Bonnie Ruths, MD  HPI/Subjective: Patient feeling a little bit better but does not feel like going dancing yet.  She has gotten better since I am seeing her.   Objective: Vitals:   09/09/17 1513 09/09/17 1626  BP: (!) 155/56 (!) 160/60  Pulse: 74 82  Resp: 18 19  Temp: 98.4 F (36.9 C) 98.9 F (37.2 C)  SpO2: 99% 93%    Filed Weights   09/09/17 1112 09/09/17 1430 09/09/17 1626  Weight: 116.6 kg (257 lb) 116.6 kg (257 lb) 116.6 kg (257 lb)    ROS: Review of Systems  Constitutional: Negative for chills and fever.  Eyes: Negative for blurred vision.  Respiratory: Negative for cough and shortness of breath.   Cardiovascular: Negative for chest pain.  Gastrointestinal: Negative for abdominal pain, constipation, diarrhea, nausea and vomiting.  Genitourinary: Negative for dysuria.  Musculoskeletal: Negative for joint pain.  Neurological: Negative for dizziness and headaches.   Exam: Physical Exam  HENT:  Nose: No mucosal edema.  Mouth/Throat: No oropharyngeal exudate or posterior oropharyngeal edema.  Eyes: Pupils are equal, round, and reactive to light. Conjunctivae, EOM and lids are normal.  Neck: No JVD present. Carotid bruit is not present. No edema present. No thyroid mass and no thyromegaly present.  Cardiovascular: S1 normal and S2 normal. Exam reveals no gallop.  No murmur heard. Pulses:      Dorsalis pedis pulses are 2+ on the right side, and 2+ on the left side.  Respiratory: No respiratory distress. She has decreased breath sounds in the right lower field and the left lower field. She has no wheezes. She has no rhonchi. She has no rales.  GI: Soft. Bowel sounds are normal. There is no tenderness.  Musculoskeletal:       Right ankle: She exhibits swelling.       Left ankle:  She exhibits swelling.  Lymphadenopathy:    She has no cervical adenopathy.  Neurological: She is alert. No cranial nerve deficit.  Able to straight leg raise bilaterally.  Skin: Skin is warm. No rash noted. Nails show no clubbing.  Psychiatric: She has a normal mood and affect.      Data Reviewed: Basic Metabolic Panel: Recent Labs  Lab 09/03/17 0423  09/05/17 3419 09/06/17 0430 09/07/17 0424 09/08/17 0353 09/09/17 0908  NA 137   < > 140 140 140 140 139  K 3.2*   < > 3.9 3.7 3.3* 3.5 3.3*  CL 99*   < > 101 98* 100 101 102  CO2 26   < > 27 27 28 28 26   GLUCOSE 127*   < > 108* 118* 123* 104* 143*  BUN 34*   < > 38* 51* 34* 41* 48*  CREATININE 3.72*   < > 4.30* 5.69* 4.37* 5.41* 6.40*  CALCIUM 8.1*   < > 8.6* 8.3* 8.2* 8.2* 8.6*  MG 1.9  --   --   --   --   --   --   PHOS 4.1  4.2   < > 5.8* 7.8* 5.9* 7.3* 6.9*   < > = values in this interval not displayed.   Liver Function Tests: Recent Labs  Lab 09/05/17 0650 09/06/17 0430 09/07/17 0424 09/08/17 0353 09/09/17 0908  ALBUMIN 2.6* 2.5* 2.3* 2.5* 2.4*   CBC: Recent  Labs  Lab 09/03/17 0423  09/05/17 0650 09/06/17 0431  09/06/17 1525 09/07/17 0424 09/07/17 1940 09/08/17 0353 09/09/17 1136  WBC 11.1*   < > 9.7 9.2  --   --  5.8  --  6.5 5.8  NEUTROABS 9.1*  --   --   --   --   --  4.3  --  4.6  --   HGB 8.3*   < > 9.1* 8.2*   < > 7.7* 5.9* 9.9* 10.1* 9.6*  HCT 24.3*   < > 26.5* 24.4*   < > 23.3* 17.8* 28.7* 29.1* 28.5*  MCV 91.7   < > 92.6 93.4  --   --  93.7  --  91.2 92.3  PLT 102*   < > 153 150  --   --  125*  --  154 163   < > = values in this interval not displayed.   Is CBG: Recent Labs  Lab 09/09/17 0048 09/09/17 0444 09/09/17 0527 09/09/17 0829 09/09/17 1512  GLUCAP 140* 67* 111* 129* 94    Recent Results (from the past 240 hour(s))  C difficile quick scan w PCR reflex     Status: None   Collection Time: 09/02/17 12:31 PM  Result Value Ref Range Status   C Diff antigen NEGATIVE NEGATIVE  Final   C Diff toxin NEGATIVE NEGATIVE Final   C Diff interpretation No C. difficile detected.  Final    Comment: Performed at [facility], [address] [place], [place] [address]  CULTURE, BLOOD (ROUTINE X 2) w Reflex to ID Panel     Status: None   Collection Time: 09/02/17  7:25 PM  Result Value Ref Range Status   Specimen Description BLOOD BLOOD RIGHT HAND  Final   Special Requests   Final    BOTTLES DRAWN AEROBIC AND ANAEROBIC Blood Culture adequate volume   Culture   Final    NO GROWTH 5 DAYS Performed at [facility], [address] [place], [place] [address]    Report Status 09/07/2017 FINAL  Final  CULTURE, BLOOD (ROUTINE X 2) w Reflex to ID Panel     Status: None   Collection Time: 09/02/17  7:25 PM  Result Value Ref Range Status   Specimen Description BLOOD BLOOD RIGHT HAND  Final   Special Requests   Final    BOTTLES DRAWN AEROBIC AND ANAEROBIC Blood Culture results may not be optimal due to an inadequate volume of blood received in culture bottles   Culture   Final    NO GROWTH 5 DAYS Performed at [facility], [address], [place], [place] [address]    Report Status 09/07/2017 FINAL  Final  Urine Culture     Status: Abnormal   Collection Time: 09/03/17  4:23 AM  Result Value Ref Range Status   Specimen Description   Final    URINE, RANDOM Performed at [facility], [address] [place], [place] [address]    Special Requests   Final    NONE Performed at [facility], [address], [place], [place] [address]    Culture (A)  Final    >=100,000 COLONIES/mL DIPHTHEROIDS(CORYNEBACTERIUM SPECIES) Standardized susceptibility testing for this organism is not available. Performed at [facility], [place] [address] [place], [place] [address]    Report Status 09/04/2017 FINAL  Final      Scheduled Meds: . [MAR Hold] amiodarone  200 mg Oral BID  . [facility]  Hold] chlorhexidine  15 mL Mouth Rinse BID  . [MAR  Hold] Chlorhexidine Gluconate Cloth  6 each Topical Q0600  . [MAR Hold] epoetin (EPOGEN/PROCRIT) injection  10,000 Units Intravenous Q T,Th,Sa-HD  . [MAR Hold] feeding supplement (NEPRO CARB STEADY)  237 mL Oral BID BM  . [MAR Hold] insulin aspart  0-5 Units Subcutaneous QHS  . [MAR Hold] insulin aspart  0-9 Units Subcutaneous TID WC  . [MAR Hold] insulin glargine  10 Units Subcutaneous Daily  . [MAR Hold] letrozole  2.5 mg Oral Daily  . [MAR Hold] mouth rinse  15 mL Mouth Rinse q12n4p  . [MAR Hold] multivitamin  1 tablet Oral QHS  . [MAR Hold] nystatin cream   Topical BID  . [MAR Hold] pantoprazole  40 mg Oral QAC breakfast  . [MAR Hold] sodium chloride flush  10-40 mL Intracatheter Q12H   Continuous Infusions: . sodium chloride      Assessment/Plan:  1. Sepsis with multiorgan failure and initial hypotension.  Likely combination of urinary tract infection versus tickborne disease.  Finished antibiotics. 2. Acute hypoxic respiratory failure with fluid overload.  Patient extubated on last Thursday.  Tapered down on nasal cannula.  Asked nurse to continue to taper oxygen. 3. Acute kidney injury.  Nephrology to have vascular place a PermCath.  Dialysis today.   4. Acute blood loss anemia with upper GI bleed.  Endoscopy showing irritation from NG tube.  NG tube removed.  Patient on po Protonix.  Hemoglobin responded well to transfusion.  Last hemoglobin 9.6. 5. Diarrhea.  Rectal tube is out 6. Elevated troponin demand ischemia in the setting of sepsis. 7. Atrial fibrillation with rapid ventricular response.  On amiodarone drip.  Currently in normal sinus rhythm.  No blood thinners with recent GI bleed 8. Thrombocytopenia secondary to sepsis.  last platelet count 163.  9. History of breast cancer on Femara 10. Morbid obesity weight loss needed 11. History of sleep apnea on BiPAP at home  12. type 2 diabetes on sliding scale 13. Weakness.  Physical therapy needed daily to get her  stronger.  Code Status:     Code Status Orders  (From admission, onward)        Start     Ordered   08/22/17 1612  Full code  Continuous     08/22/17 1611    Code Status History    This patient has a current code status but no historical code status.     Family Communication: Husband and sister at the bedside Disposition Plan: Likely will need rehab  Consultants:  Nephrology  Time spent: 28 minutes  [place]

## 2017-09-09 NOTE — Progress Notes (Signed)
HD tx start    09/09/17 1118  Vital Signs  Pulse Rate 80  Pulse Rate Source Monitor  Resp 16  BP 133/64  BP Location Right Wrist  BP Method Automatic  Patient Position (if appropriate) Lying  Oxygen Therapy  SpO2 98 %  O2 Device Nasal Cannula  O2 Flow Rate (L/min) 2 L/min  During Hemodialysis Assessment  Blood Flow Rate (mL/min) 400 mL/min  Arterial Pressure (mmHg) -170 mmHg  Venous Pressure (mmHg) 160 mmHg  Transmembrane Pressure (mmHg) 50 mmHg  Ultrafiltration Rate (mL/min) 170 mL/min  Dialysate Flow Rate (mL/min) 600 ml/min  Conductivity: Machine  13.9  HD Safety Checks Performed Yes  Dialysis Fluid Bolus Normal Saline  Bolus Amount (mL) 250 mL  Intra-Hemodialysis Comments Tx initiated  Hemodialysis Catheter Left Internal jugular Triple-lumen  Placement Date/Time: 08/29/17 1400   Time Out: Correct patient;Correct site;Correct procedure  Maximum sterile barrier precautions: Hand hygiene;Sterile gloves;Cap;Large sterile sheet;Mask;Sterile gown  Site Prep: Chlorhexidine  Local Anesthetic: Inje...  Blue Lumen Status Infusing  Red Lumen Status Infusing

## 2017-09-09 NOTE — Progress Notes (Signed)
[facility]  ROUNDING NOTE   Subjective:   Seen and examined on hemodialysis.   Creatinine 6.4 (5.41)  UOP 730  Objective:  Vital signs in last 24 hours:  Temp:  [98.3 F (36.8 C)-98.9 F (37.2 C)] 98.9 F (37.2 C) (06/27 1112) Pulse Rate:  [72-89] 77 (06/27 1245) Resp:  [13-20] 13 (06/27 1245) BP: (125-162)/(32-73) 138/58 (06/27 1245) SpO2:  [97 %-100 %] 100 % (06/27 1245) Weight:  [106.6 kg (235 lb)-116.6 kg (257 lb)] 116.6 kg (257 lb) (06/27 1112)  Weight change: 5.485 kg (12 lb 1.5 oz) Filed Weights   09/08/17 1449 09/09/17 0443 09/09/17 1112  Weight: 111.6 kg (246 lb) 106.6 kg (235 lb) 116.6 kg (257 lb)    Intake/Output: I/O last 3 completed shifts: In: 530.1 [P.O.:420; I.V.:110.1] Out: 1060 [Urine:1010; Stool:50]   Intake/Output this shift:  No intake/output data recorded.  Physical Exam: General: Ill appearing  Head: NG tube to suction  Eyes: Anicteric, PERRL  Neck: Supple, trachea midline  Lungs:  Bases diminished, 5 L [place]  Heart: Irregular rhythm  Abdomen:  Obese, nontender  Extremities: 1+ peripheral edema.  Neurologic: Nonfocal, moving all four extremities  Skin: No lesions  Access: Left IJ temp HD catheter    Basic Metabolic Panel: Recent Labs  Lab 09/03/17 0423  09/05/17 0650 09/06/17 0430 09/07/17 0424 09/08/17 0353 09/09/17 0908  NA 137   < > 140 140 140 140 139  K 3.2*   < > 3.9 3.7 3.3* 3.5 3.3*  CL 99*   < > 101 98* 100 101 102  CO2 26   < > 27 27 28 28 26   GLUCOSE 127*   < > 108* 118* 123* 104* 143*  BUN 34*   < > 38* 51* 34* 41* 48*  CREATININE 3.72*   < > 4.30* 5.69* 4.37* 5.41* 6.40*  CALCIUM 8.1*   < > 8.6* 8.3* 8.2* 8.2* 8.6*  MG 1.9  --   --   --   --   --   --   PHOS 4.1  4.2   < > 5.8* 7.8* 5.9* 7.3* 6.9*   < > = values in this interval not displayed.    Liver Function Tests: Recent Labs  Lab 09/05/17 0650 09/06/17 0430 09/07/17 0424 09/08/17 0353 09/09/17 0908  ALBUMIN 2.6* 2.5* 2.3* 2.5* 2.4*    No results for input(s): LIPASE, AMYLASE in the last 168 hours. No results for input(s): AMMONIA in the last 168 hours.  CBC: Recent Labs  Lab 09/03/17 0423  09/05/17 0650 09/06/17 0431  09/06/17 1525 09/07/17 0424 09/07/17 1940 09/08/17 0353 09/09/17 1136  WBC 11.1*   < > 9.7 9.2  --   --  5.8  --  6.5 5.8  NEUTROABS 9.1*  --   --   --   --   --  4.3  --  4.6  --   HGB 8.3*   < > 9.1* 8.2*   < > 7.7* 5.9* 9.9* 10.1* 9.6*  HCT 24.3*   < > 26.5* 24.4*   < > 23.3* 17.8* 28.7* 29.1* 28.5*  MCV 91.7   < > 92.6 93.4  --   --  93.7  --  91.2 92.3  PLT 102*   < > 153 150  --   --  125*  --  154 163   < > = values in this interval not displayed.    Cardiac Enzymes: No results for input(s): CKTOTAL, CKMB, CKMBINDEX,  TROPONINI in the last 168 hours.  BNP: Invalid input(s): POCBNP  CBG: Recent Labs  Lab 09/08/17 1959 09/09/17 0048 09/09/17 0444 09/09/17 0527 09/09/17 0829  GLUCAP 140* 140* 67* 111* 129*    Microbiology: Results for orders placed or performed during the hospital encounter of 08/22/17  Blood Culture (routine x 2)     Status: None   Collection Time: 08/22/17 12:52 PM  Result Value Ref Range Status   Specimen Description BLOOD RIGHT HAND  Final   Special Requests   Final    BOTTLES DRAWN AEROBIC AND ANAEROBIC Blood Culture adequate volume   Culture   Final    NO GROWTH 5 DAYS Performed at [facility], [address] [place], [place] [address]    Report Status 08/27/2017 FINAL  Final  Urine culture     Status: Abnormal   Collection Time: 08/22/17 12:52 PM  Result Value Ref Range Status   Specimen Description   Final    URINE, RANDOM Performed at [facility], [address] [place], [place] [address]    Special Requests   Final    NONE Performed at [facility], [address], [place], [place] [address]    Culture (A)  Final    >=100,000 COLONIES/mL KLEBSIELLA PNEUMONIAE 60,000 COLONIES/mL PROTEUS MIRABILIS     Report Status 08/24/2017 FINAL  Final   Organism ID, Bacteria KLEBSIELLA PNEUMONIAE (A)  Final   Organism ID, Bacteria PROTEUS MIRABILIS (A)  Final      Susceptibility   Klebsiella pneumoniae - MIC*    AMPICILLIN RESISTANT Resistant     CEFAZOLIN <=4 SENSITIVE Sensitive     CEFTRIAXONE <=1 SENSITIVE Sensitive     CIPROFLOXACIN <=0.25 SENSITIVE Sensitive     GENTAMICIN <=1 SENSITIVE Sensitive     IMIPENEM <=0.25 SENSITIVE Sensitive     NITROFURANTOIN 64 INTERMEDIATE Intermediate     TRIMETH/SULFA <=20 SENSITIVE Sensitive     AMPICILLIN/SULBACTAM 4 SENSITIVE Sensitive     PIP/TAZO <=4 SENSITIVE Sensitive     Extended ESBL NEGATIVE Sensitive     * >=100,000 COLONIES/mL KLEBSIELLA PNEUMONIAE   Proteus mirabilis - MIC*    AMPICILLIN <=2 SENSITIVE Sensitive     CEFAZOLIN <=4 SENSITIVE Sensitive     CEFTRIAXONE <=1 SENSITIVE Sensitive     CIPROFLOXACIN <=0.25 SENSITIVE Sensitive     GENTAMICIN <=1 SENSITIVE Sensitive     IMIPENEM 1 SENSITIVE Sensitive     NITROFURANTOIN 128 RESISTANT Resistant     TRIMETH/SULFA <=20 SENSITIVE Sensitive     AMPICILLIN/SULBACTAM <=2 SENSITIVE Sensitive     PIP/TAZO <=4 SENSITIVE Sensitive     * 60,000 COLONIES/mL PROTEUS MIRABILIS  Blood Culture (routine x 2)     Status: None   Collection Time: 08/22/17 12:53 PM  Result Value Ref Range Status   Specimen Description BLOOD RIGHT ARM  Final   Special Requests   Final    BOTTLES DRAWN AEROBIC AND ANAEROBIC Blood Culture results may not be optimal due to an excessive volume of blood received in culture bottles   Culture   Final    NO GROWTH 5 DAYS Performed at [facility], [address] [place], [place] [address]    Report Status 08/27/2017 FINAL  Final  MRSA PCR Screening     Status: None   Collection Time: 08/22/17  9:18 PM  Result Value Ref Range Status   MRSA by PCR NEGATIVE NEGATIVE Final    Comment:  The GeneXpert MRSA Assay (FDA approved for NASAL specimens only), is one  component of a comprehensive MRSA colonization surveillance program. It is not intended to diagnose MRSA infection nor to guide or monitor treatment for MRSA infections. Performed at [facility], [address] [place], [place] [address]   Culture, blood (Routine X 2) w Reflex to ID Panel     Status: None   Collection Time: 08/29/17  9:31 PM  Result Value Ref Range Status   Specimen Description BLOOD LEFT FOOT  Final   Special Requests   Final    BOTTLES DRAWN AEROBIC AND ANAEROBIC Blood Culture adequate volume   Culture   Final    NO GROWTH 5 DAYS Performed at [facility], [address], [place], [place] [address]    Report Status 09/03/2017 FINAL  Final  Culture, blood (Routine X 2) w Reflex to ID Panel     Status: None   Collection Time: 08/29/17  9:50 PM  Result Value Ref Range Status   Specimen Description BLOOD RIGHT ANKLE  Final   Special Requests   Final    BOTTLES DRAWN AEROBIC AND ANAEROBIC Blood Culture adequate volume   Culture   Final    NO GROWTH 5 DAYS Performed at [facility], [address], [place], [place] [address]    Report Status 09/03/2017 FINAL  Final  C difficile quick scan w PCR reflex     Status: None   Collection Time: 09/02/17 12:31 PM  Result Value Ref Range Status   C Diff antigen NEGATIVE NEGATIVE Final   C Diff toxin NEGATIVE NEGATIVE Final   C Diff interpretation No C. difficile detected.  Final    Comment: Performed at [facility], [address] [place], [place] [address]  CULTURE, BLOOD (ROUTINE X 2) w Reflex to ID Panel     Status: None   Collection Time: 09/02/17  7:25 PM  Result Value Ref Range Status   Specimen Description BLOOD BLOOD RIGHT HAND  Final   Special Requests   Final    BOTTLES DRAWN AEROBIC AND ANAEROBIC Blood Culture adequate volume   Culture   Final    NO GROWTH 5 DAYS Performed at [facility], [address] [place], [place] [address]    Report Status  09/07/2017 FINAL  Final  CULTURE, BLOOD (ROUTINE X 2) w Reflex to ID Panel     Status: None   Collection Time: 09/02/17  7:25 PM  Result Value Ref Range Status   Specimen Description BLOOD BLOOD RIGHT HAND  Final   Special Requests   Final    BOTTLES DRAWN AEROBIC AND ANAEROBIC Blood Culture results may not be optimal due to an inadequate volume of blood received in culture bottles   Culture   Final    NO GROWTH 5 DAYS Performed at [facility], [address] [place], [place] [address]    Report Status 09/07/2017 FINAL  Final  Urine Culture     Status: Abnormal   Collection Time: 09/03/17  4:23 AM  Result Value Ref Range Status   Specimen Description   Final    URINE, RANDOM Performed at [facility], [address] [place], [place] [address]    Special Requests   Final    NONE Performed at [facility], [address] [place], [place] [address]    Culture (A)  Final    >=100,000 COLONIES/mL DIPHTHEROIDS(CORYNEBACTERIUM SPECIES) Standardized susceptibility testing for this  organism is not available. Performed at [facility], [place] [address], [place], [place] [address]    Report Status 09/04/2017 FINAL  Final    Coagulation Studies: No results for input(s): LABPROT, INR in the last 72 hours.  Urinalysis: No results for input(s): COLORURINE, LABSPEC, PHURINE, GLUCOSEU, HGBUR, BILIRUBINUR, KETONESUR, PROTEINUR, UROBILINOGEN, NITRITE, LEUKOCYTESUR in the last 72 hours.  Invalid input(s): APPERANCEUR    Imaging: Dg Chest Port 1 View  Result Date: 09/07/2017 CLINICAL DATA:  PICC placement. EXAM: PORTABLE CHEST 1 VIEW COMPARISON:  09/02/2017. FINDINGS: Right PICC tip poorly visualized in the region of the confluence of the innominate veins. Stable left jugular double-lumen catheter. The endotracheal and nasogastric tubes have been removed. Stable enlarged cardiac silhouette. Increased prominence of the pulmonary vasculature and interstitial  markings. No significant change in a small to moderate-sized left pleural effusion with left basilar patchy opacity. Mild increase in size of a small right pleural effusion with interval patchy opacity at the right lung base. Thoracic spine degenerative changes. IMPRESSION: 1. Right PICC tip in the region of the origin of the superior vena cava. This would need to be advanced 7 cm to place it at the superior cavoatrial junction. 2. Progressive pulmonary vascular congestion with interval interstitial pulmonary edema with a probable of the lower component in the lower lung zones. 3. Stable left pleural effusion and left basilar pneumonia or patchy atelectasis. 4. Slight increase in size of a small right pleural effusion with interval mild right basilar alveolar edema, pneumonia or patchy atelectasis. Electronically Signed   By: Claudie Revering M.D.   On: 09/07/2017 16:46   [place] Ekg Site Rite  Result Date: 09/08/2017 If Site Rite image not attached, placement could not be confirmed due to current cardiac rhythm.    Medications:    . amiodarone  200 mg Oral BID  . chlorhexidine  15 mL Mouth Rinse BID  . Chlorhexidine Gluconate Cloth  6 each Topical Q0600  . insulin aspart  0-5 Units Subcutaneous QHS  . insulin aspart  0-9 Units Subcutaneous TID WC  . insulin glargine  10 Units Subcutaneous Daily  . letrozole  2.5 mg Oral Daily  . mouth rinse  15 mL Mouth Rinse q12n4p  . multivitamin  1 tablet Oral QHS  . nystatin cream   Topical BID  . [START ON 09/10/2017] pantoprazole  40 mg Oral QAC breakfast  . sodium chloride flush  10-40 mL Intracatheter Q12H   acetaminophen **OR** acetaminophen, bisacodyl, ipratropium-albuterol, metoprolol tartrate, [DISCONTINUED] ondansetron **OR** ondansetron (ZOFRAN) IV, sodium chloride flush  Assessment/ Plan:  [name] is a 72 y.o. white female with diabetes mellitus type II, coronary artery disease, obstructive sleep apnea, history of breast cancer, was  admitted on6/9/2019with fever, chills, tick bite.  1. Acute renal failure on chronic kidney disease stage III Baseline creatinine of 1.1, GFR of 49 on 06/01/17  Requiring hemodialysis. Last hemodialysis treatment was 6/24. Placed on hemodialysis treatment today. Tolerating treatment.  Nonoliguric urine output.  Monitor daily for dialysis need.  Tunneled catheter for later today - appreciate Vascular input.  Start outpatient planning  2. Sepsis with Urinary tract infection and recent tick bite (concern for tick borne illness). Urine culture with corynebacterium species. Completed course of antibiotics.  Off vasopressors.   3. Anemia with renal failure: now with peptic ulcer disease/GIB and acute blood loss. Status post endoscopy with clipping of gastric vessels by Dr. Allen Norris on 6/25  Status post 2 units PRBC transfusion 6/25.  -  EPO with HD treatment today.    LOS: 18 Braxton Weisbecker 6/27/20191:03 PM

## 2017-09-09 NOTE — Progress Notes (Signed)
Pre HD assessment    09/09/17 1112  Vital Signs  Temp 98.9 F (37.2 C)  Temp Source Oral  Pulse Rate 80  Pulse Rate Source Monitor  Resp 17  BP (!) 134/58  BP Location Right Wrist  BP Method Automatic  Patient Position (if appropriate) Lying  Oxygen Therapy  SpO2 97 %  O2 Device Nasal Cannula  O2 Flow Rate (L/min) 2 L/min  Pain Assessment  Pain Scale 0-10  Pain Score 0  Dialysis Weight  Weight 116.6 kg (257 lb)  Type of Weight Pre-Dialysis  Time-Out for Hemodialysis  What Procedure? HD  Pt Identifiers(min of two) First/Last Name;MRN/Account#  Correct Site? Yes  Correct Side? Yes  Correct Procedure? Yes  Consents Verified? Yes  Rad Studies Available? N/A  Safety Precautions Reviewed? Yes  Engineer, civil (consulting) Number  (3A)  Station Number 4  UF/Alarm Test Passed  Conductivity: Meter 13.8  Conductivity: Machine  13.9  pH 7.4  Reverse Osmosis main  Normal Saline Lot Number [id number]  Dialyzer Lot Number [id number]  Disposable Set Lot Number [id number]  Machine Temperature 96.8 F (36 C)  Musician and Audible Yes  Blood Lines Intact and Secured Yes  Education / Care Plan  Dialysis Education Provided Yes  Documented Education in Care Plan Yes  Hemodialysis Catheter Left Internal jugular Triple-lumen  Placement Date/Time: 08/29/17 1400   Time Out: Correct patient;Correct site;Correct procedure  Maximum sterile barrier precautions: Hand hygiene;Sterile gloves;Cap;Large sterile sheet;Mask;Sterile gown  Site Prep: Chlorhexidine  Local Anesthetic: Inje...  Site Condition No complications  Blue Lumen Status Heparin locked  Red Lumen Status Heparin locked  Purple Lumen Status N/A  Dressing Type Biopatch  Dressing Status Clean;Dry;Intact  Drainage Description None

## 2017-09-09 NOTE — Consult Note (Signed)
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  Has been extubated and is now weaning off oxygen.  Renal function has not improved.  Continuing IV antibiotics 3.  Diabetes.  On sliding scale insulin.  Glucose control important in reducing progression of atherosclerosis. 4.  Thrombocytopenia from sepsis which is now improved with last platelet count over 150,000 5.  Atrial fibrillation with rapid ventricular response during her septic episode.  Has been on amiodarone.  Not on blood thinners with a recent GI bleed.   Leotis Pain, MD  09/09/2017 4:51 PM    This note was created with Dragon medical transcription system.  Any error is purely unintentional

## 2017-09-10 ENCOUNTER — Encounter: Payer: Self-pay | Admitting: Gastroenterology

## 2017-09-10 ENCOUNTER — Encounter: Payer: Self-pay | Admitting: *Deleted

## 2017-09-10 LAB — GLUCOSE, CAPILLARY
GLUCOSE-CAPILLARY: 121 mg/dL — AB (ref 70–99)
GLUCOSE-CAPILLARY: 190 mg/dL — AB (ref 70–99)
GLUCOSE-CAPILLARY: 174 mg/dL — AB (ref 70–99)
Glucose-Capillary: 127 mg/dL — ABNORMAL HIGH (ref 70–99)

## 2017-09-10 LAB — RENAL FUNCTION PANEL
ANION GAP: 10 (ref 5–15)
Albumin: 2.5 g/dL — ABNORMAL LOW (ref 3.5–5.0)
BUN: 26 mg/dL — ABNORMAL HIGH (ref 8–23)
CALCIUM: 8.6 mg/dL — AB (ref 8.9–10.3)
CO2: 27 mmol/L (ref 22–32)
Chloride: 102 mmol/L (ref 98–111)
Creatinine, Ser: 4.07 mg/dL — ABNORMAL HIGH (ref 0.44–1.00)
GFR calc non Af Amer: 10 mL/min — ABNORMAL LOW (ref >60)
GFR, EST AFRICAN AMERICAN: 12 mL/min — AB (ref >60)
Glucose, Bld: 128 mg/dL — ABNORMAL HIGH (ref 70–99)
POTASSIUM: 3.5 mmol/L (ref 3.5–5.1)
Phosphorus: 3.5 mg/dL (ref 2.5–4.6)
Sodium: 139 mmol/L (ref 135–145)

## 2017-09-10 LAB — ROCKY MTN SPOTTED FVR ABS PNL(IGG+IGM)
RMSF IGM: 0.26 {index} (ref 0.00–0.89)
RMSF IgG: NEGATIVE

## 2017-09-10 MED ORDER — METOPROLOL SUCCINATE ER 50 MG PO TB24
50.0000 mg | ORAL_TABLET | Freq: Every day | ORAL | Status: DC
  Administered 2017-09-10 – 2017-09-15 (×6): 50 mg via ORAL
  Filled 2017-09-10 (×6): qty 1

## 2017-09-10 MED ORDER — VITAMIN D 1000 UNITS PO TABS
5000.0000 [IU] | ORAL_TABLET | Freq: Every day | ORAL | Status: DC
  Administered 2017-09-10 – 2017-09-15 (×6): 5000 [IU] via ORAL
  Filled 2017-09-10 (×6): qty 5

## 2017-09-10 MED ORDER — ATORVASTATIN CALCIUM 80 MG PO TABS
80.0000 mg | ORAL_TABLET | Freq: Every day | ORAL | Status: DC
  Administered 2017-09-10 – 2017-09-14 (×5): 80 mg via ORAL
  Filled 2017-09-10 (×2): qty 4
  Filled 2017-09-10 (×4): qty 1
  Filled 2017-09-10 (×3): qty 4
  Filled 2017-09-10 (×2): qty 1

## 2017-09-10 MED ORDER — MELATONIN 5 MG PO TABS
10.0000 mg | ORAL_TABLET | Freq: Every day | ORAL | Status: DC
  Administered 2017-09-10 – 2017-09-14 (×5): 10 mg via ORAL
  Filled 2017-09-10 (×6): qty 2

## 2017-09-10 NOTE — Clinical Social Work Note (Signed)
CSW met with patient and her husband to discuss SNF placement.  CSW explained how insurance will pay for stay, and what the process is for finding bed placement for SNF.  CSW explained what to expect at SNF and how transfer to facility would occur.  CSW was given permission to begin bed search in [place].    Jones Broom. Norval Morton, MSW, [place]  09/10/2017 12:23 PM

## 2017-09-10 NOTE — Progress Notes (Signed)
[facility]  ROUNDING NOTE   Subjective:   Hemodialysis treatment yesterday.   RIJ permcath placed yesterday  Husband at bedside.   Sitting in chair  Objective:  Vital signs in last 24 hours:  Temp:  [98 F (36.7 C)-98.9 F (37.2 C)] 98 F (36.7 C) (06/28 0739) Pulse Rate:  [69-98] 98 (06/28 0739) Resp:  [11-21] 18 (06/28 0316) BP: (85-181)/(32-106) 154/42 (06/28 0739) SpO2:  [90 %-100 %] 94 % (06/28 0739) Weight:  [106.5 kg (234 lb 12.8 oz)-116.6 kg (257 lb)] 106.5 kg (234 lb 12.8 oz) (06/28 0316)  Weight change: 4.99 kg (11 lb) Filed Weights   09/09/17 1430 09/09/17 1626 09/10/17 0316  Weight: 116.6 kg (257 lb) 116.6 kg (257 lb) 106.5 kg (234 lb 12.8 oz)    Intake/Output: I/O last 3 completed shifts: In: 50.1 [I.V.:50.1] Out: 883 [Urine:925]   Intake/Output this shift:  Total I/O In: 240 [P.O.:240] Out: -   Physical Exam: General: Sitting in chair  Head: NT/AT  Eyes: Anicteric, PERRL  Neck: Supple, trachea midline  Lungs:  clear  Heart: Irregular rhythm  Abdomen:  Obese, nontender  Extremities: trace peripheral edema.  Neurologic: Nonfocal, moving all four extremities  Skin: No lesions  Access: Left IJ temp HD catheter, RIJ permcath    Basic Metabolic Panel: Recent Labs  Lab 09/06/17 0430 09/07/17 0424 09/08/17 0353 09/09/17 0908 09/10/17 0828  NA 140 140 140 139 139  K 3.7 3.3* 3.5 3.3* 3.5  CL 98* 100 101 102 102  CO2 27 28 28 26 27   GLUCOSE 118* 123* 104* 143* 128*  BUN 51* 34* 41* 48* 26*  CREATININE 5.69* 4.37* 5.41* 6.40* 4.07*  CALCIUM 8.3* 8.2* 8.2* 8.6* 8.6*  PHOS 7.8* 5.9* 7.3* 6.9* 3.5    Liver Function Tests: Recent Labs  Lab 09/06/17 0430 09/07/17 0424 09/08/17 0353 09/09/17 0908 09/10/17 0828  ALBUMIN 2.5* 2.3* 2.5* 2.4* 2.5*   No results for input(s): LIPASE, AMYLASE in the last 168 hours. No results for input(s): AMMONIA in the last 168 hours.  CBC: Recent Labs  Lab 09/05/17 0650 09/06/17 0431   09/06/17 1525 09/07/17 0424 09/07/17 1940 09/08/17 0353 09/09/17 1136  WBC 9.7 9.2  --   --  5.8  --  6.5 5.8  NEUTROABS  --   --   --   --  4.3  --  4.6  --   HGB 9.1* 8.2*   < > 7.7* 5.9* 9.9* 10.1* 9.6*  HCT 26.5* 24.4*   < > 23.3* 17.8* 28.7* 29.1* 28.5*  MCV 92.6 93.4  --   --  93.7  --  91.2 92.3  PLT 153 150  --   --  125*  --  154 163   < > = values in this interval not displayed.    Cardiac Enzymes: No results for input(s): CKTOTAL, CKMB, CKMBINDEX, TROPONINI in the last 168 hours.  BNP: Invalid input(s): POCBNP  CBG: Recent Labs  Lab 09/09/17 1512 09/09/17 1742 09/09/17 2043 09/09/17 2244 09/10/17 0741  GLUCAP 94 107* 233* 209* 121*    Microbiology: Results for orders placed or performed during the hospital encounter of 08/22/17  Blood Culture (routine x 2)     Status: None   Collection Time: 08/22/17 12:52 PM  Result Value Ref Range Status   Specimen Description BLOOD RIGHT HAND  Final   Special Requests   Final    BOTTLES DRAWN AEROBIC AND ANAEROBIC Blood Culture adequate volume   Culture  Final    NO GROWTH 5 DAYS Performed at [facility], [address] [place], [place] [address]    Report Status 08/27/2017 FINAL  Final  Urine culture     Status: Abnormal   Collection Time: 08/22/17 12:52 PM  Result Value Ref Range Status   Specimen Description   Final    URINE, RANDOM Performed at [facility], [address], [place], [place] [address]    Special Requests   Final    NONE Performed at [facility], [address] [place], [place] [address]    Culture (A)  Final    >=100,000 COLONIES/mL KLEBSIELLA PNEUMONIAE 60,000 COLONIES/mL PROTEUS MIRABILIS    Report Status 08/24/2017 FINAL  Final   Organism ID, Bacteria KLEBSIELLA PNEUMONIAE (A)  Final   Organism ID, Bacteria PROTEUS MIRABILIS (A)  Final      Susceptibility   Klebsiella pneumoniae - MIC*    AMPICILLIN RESISTANT Resistant     CEFAZOLIN <=4 SENSITIVE  Sensitive     CEFTRIAXONE <=1 SENSITIVE Sensitive     CIPROFLOXACIN <=0.25 SENSITIVE Sensitive     GENTAMICIN <=1 SENSITIVE Sensitive     IMIPENEM <=0.25 SENSITIVE Sensitive     NITROFURANTOIN 64 INTERMEDIATE Intermediate     TRIMETH/SULFA <=20 SENSITIVE Sensitive     AMPICILLIN/SULBACTAM 4 SENSITIVE Sensitive     PIP/TAZO <=4 SENSITIVE Sensitive     Extended ESBL NEGATIVE Sensitive     * >=100,000 COLONIES/mL KLEBSIELLA PNEUMONIAE   Proteus mirabilis - MIC*    AMPICILLIN <=2 SENSITIVE Sensitive     CEFAZOLIN <=4 SENSITIVE Sensitive     CEFTRIAXONE <=1 SENSITIVE Sensitive     CIPROFLOXACIN <=0.25 SENSITIVE Sensitive     GENTAMICIN <=1 SENSITIVE Sensitive     IMIPENEM 1 SENSITIVE Sensitive     NITROFURANTOIN 128 RESISTANT Resistant     TRIMETH/SULFA <=20 SENSITIVE Sensitive     AMPICILLIN/SULBACTAM <=2 SENSITIVE Sensitive     PIP/TAZO <=4 SENSITIVE Sensitive     * 60,000 COLONIES/mL PROTEUS MIRABILIS  Blood Culture (routine x 2)     Status: None   Collection Time: 08/22/17 12:53 PM  Result Value Ref Range Status   Specimen Description BLOOD RIGHT ARM  Final   Special Requests   Final    BOTTLES DRAWN AEROBIC AND ANAEROBIC Blood Culture results may not be optimal due to an excessive volume of blood received in culture bottles   Culture   Final    NO GROWTH 5 DAYS Performed at [facility], [address] [place], [place] [address]    Report Status 08/27/2017 FINAL  Final  MRSA PCR Screening     Status: None   Collection Time: 08/22/17  9:18 PM  Result Value Ref Range Status   MRSA by PCR NEGATIVE NEGATIVE Final    Comment:        The GeneXpert MRSA Assay (FDA approved for NASAL specimens only), is one component of a comprehensive MRSA colonization surveillance program. It is not intended to diagnose MRSA infection nor to guide or monitor treatment for MRSA infections. Performed at [facility], [address] [place], [place] [address]   Culture,  blood (Routine X 2) w Reflex to ID Panel     Status: None   Collection Time: 08/29/17  9:31 PM  Result Value Ref Range Status   Specimen Description BLOOD LEFT FOOT  Final   Special Requests   Final    BOTTLES DRAWN AEROBIC AND ANAEROBIC Blood Culture  adequate volume   Culture   Final    NO GROWTH 5 DAYS Performed at [facility], [address] [place], [place] [address]    Report Status 09/03/2017 FINAL  Final  Culture, blood (Routine X 2) w Reflex to ID Panel     Status: None   Collection Time: 08/29/17  9:50 PM  Result Value Ref Range Status   Specimen Description BLOOD RIGHT ANKLE  Final   Special Requests   Final    BOTTLES DRAWN AEROBIC AND ANAEROBIC Blood Culture adequate volume   Culture   Final    NO GROWTH 5 DAYS Performed at [facility], [address], [place], [place] [address]    Report Status 09/03/2017 FINAL  Final  C difficile quick scan w PCR reflex     Status: None   Collection Time: 09/02/17 12:31 PM  Result Value Ref Range Status   C Diff antigen NEGATIVE NEGATIVE Final   C Diff toxin NEGATIVE NEGATIVE Final   C Diff interpretation No C. difficile detected.  Final    Comment: Performed at [facility], [address] [place], [place] [address]  CULTURE, BLOOD (ROUTINE X 2) w Reflex to ID Panel     Status: None   Collection Time: 09/02/17  7:25 PM  Result Value Ref Range Status   Specimen Description BLOOD BLOOD RIGHT HAND  Final   Special Requests   Final    BOTTLES DRAWN AEROBIC AND ANAEROBIC Blood Culture adequate volume   Culture   Final    NO GROWTH 5 DAYS Performed at [facility], [address] [place], [place] [address]    Report Status 09/07/2017 FINAL  Final  CULTURE, BLOOD (ROUTINE X 2) w Reflex to ID Panel     Status: None   Collection Time: 09/02/17  7:25 PM  Result Value Ref Range Status   Specimen Description BLOOD BLOOD RIGHT HAND  Final   Special Requests   Final    BOTTLES DRAWN AEROBIC  AND ANAEROBIC Blood Culture results may not be optimal due to an inadequate volume of blood received in culture bottles   Culture   Final    NO GROWTH 5 DAYS Performed at [facility], [address] [place], [place] [address]    Report Status 09/07/2017 FINAL  Final  Urine Culture     Status: Abnormal   Collection Time: 09/03/17  4:23 AM  Result Value Ref Range Status   Specimen Description   Final    URINE, RANDOM Performed at [facility], [address], [place], [place] [address]    Special Requests   Final    NONE Performed at [facility], [address], [place], [place] [address]    Culture (A)  Final    >=100,000 COLONIES/mL DIPHTHEROIDS(CORYNEBACTERIUM SPECIES) Standardized susceptibility testing for this organism is not available. Performed at [facility], [place] [address] [place], [place] [address]    Report Status 09/04/2017 FINAL  Final    Coagulation Studies: No results for input(s): LABPROT, INR in the last 72 hours.  Urinalysis: No results for input(s): COLORURINE, LABSPEC, PHURINE, GLUCOSEU, HGBUR, BILIRUBINUR, KETONESUR, PROTEINUR, UROBILINOGEN, NITRITE, LEUKOCYTESUR in the last 72 hours.  Invalid input(s): APPERANCEUR    Imaging: No results found.   Medications:    . amiodarone  200 mg Oral BID  . chlorhexidine  15 mL Mouth Rinse BID  . Chlorhexidine Gluconate Cloth  6 each Topical Q0600  . [  START ON 09/11/2017] epoetin (EPOGEN/PROCRIT) injection  10,000 Units Intravenous Q T,Th,Sa-HD  . feeding supplement (NEPRO CARB STEADY)  237 mL Oral BID BM  . insulin aspart  0-5 Units Subcutaneous QHS  . insulin aspart  0-9 Units Subcutaneous TID WC  . insulin glargine  10 Units Subcutaneous Daily  . letrozole  2.5 mg Oral Daily  . mouth rinse  15 mL Mouth Rinse q12n4p  . Melatonin  10 mg Oral QHS  . multivitamin  1 tablet Oral QHS  . nystatin cream   Topical BID  . pantoprazole  40 mg Oral QAC breakfast  . sodium  chloride flush  10-40 mL Intracatheter Q12H   acetaminophen **OR** acetaminophen, bisacodyl, ipratropium-albuterol, metoprolol tartrate, [DISCONTINUED] ondansetron **OR** ondansetron (ZOFRAN) IV, sodium chloride flush  Assessment/ Plan:  [name] is a 73 y.o. white female with diabetes mellitus type II, coronary artery disease, obstructive sleep apnea, history of breast cancer, was admitted on6/9/2019with fever, chills, tick bite.  1. Acute renal failure on chronic kidney disease stage III Baseline creatinine of 1.1, GFR of 49 on 06/01/17  Requiring hemodialysis. Last hemodialysis treatment was 6/27.  Nonoliguric urine output.  Monitor daily for dialysis need.  Start outpatient planning. [place].   2. Sepsis with Urinary tract infection and recent tick bite (concern for tick borne illness). Urine culture with corynebacterium species. Completed course of antibiotics.   3. Anemia with renal failure: now with peptic ulcer disease/GIB and acute blood loss. Status post endoscopy with clipping of gastric vessels by Dr. Allen Norris on 6/25  Status post 2 units PRBC transfusion 6/25.  - EPO with HD treatments.    LOS: [place] 6/28/201910:42 AM

## 2017-09-10 NOTE — Progress Notes (Signed)
   09/05/17 1803  PPD Results  Does patient have an induration at the injection site? No

## 2017-09-10 NOTE — Plan of Care (Signed)
°  Problem: Respiratory: °Goal: Ability to maintain adequate ventilation will improve °Outcome: Progressing °  °

## 2017-09-10 NOTE — Progress Notes (Signed)
Pt does not want to wear use our machine, recommended she have someone bring in her machine from home. She stated tomorrow

## 2017-09-10 NOTE — Progress Notes (Signed)
Physical Therapy Treatment Patient Details Name: Bonnie Holt MRN: [id number] DOB: 08-Oct-1945 Today's Date: 09/10/2017    History of Present Illness Pt admitted to hospital on 08/22/17 and has been diagnosed with sepsis secondary to UTI, acute renal failure, and acute respiratory failure. Pts hospital stay has been complicated by fall on 5/00/93 intubation on 08/31/17 and extubation on 09/02/17. Pt has a past medical history that includes breast cancer, coronary artery disease, and HTN. Pt currently has tunneled L IJ catheter for HD.     PT Comments    Pt states that she is doing well this afternoon however is very fatigued after session with OT this morning. Pt is eager to work with therapy and is instructed in seated there-ex which she tolerated well. Pt instructed with continuous verbal cuing sit>stands for safety and technique. Pt demonstrates variable performance in sit to stands however does better towards end even with fatigue. Amb not assessed at this session due to pts fatigue. Endurance and fatigue appear to be the overall limiting factor at this point. Pt could benefit from continued skilled therapy at this time to improve deficits toward PLOF. PT will continue to work with pt at least 2x/week while admitted. D/c recommendations continue to be SNF.   Follow Up Recommendations  SNF     Equipment Recommendations  None recommended by PT    Recommendations for Other Services       Precautions / Restrictions Precautions Precautions: Fall;Other (comment) Precaution Comments: R IJ HD permcath now Restrictions Weight Bearing Restrictions: No    Mobility  Bed Mobility               General bed mobility comments: deferred, up in recliner  Transfers Overall transfer level: Needs assistance Equipment used: Rolling walker (2 wheeled) Transfers: Sit to/from Stand Sit to Stand: Min assist         General transfer comment: pt min assist and verbal cuing for safe sit<>stand  transfers. Pt performed 7x total improving performance throughout despite fatiguing during.  Ambulation/Gait             General Gait Details: not assessed this date due to pt fatigue upon arrival and with ther-ex   Stairs             Wheelchair Mobility    Modified Rankin (Stroke Patients Only)       Balance Overall balance assessment: Needs assistance   Sitting balance-Leahy Scale: Good       Standing balance-Leahy Scale: Fair                              Cognition Arousal/Alertness: Awake/alert Behavior During Therapy: WFL for tasks assessed/performed Overall Cognitive Status: Within Functional Limits for tasks assessed                                 General Comments: overall WFL, min cues for safety during session and additional time/simple cues for performing novel BUE exercises with the theraband      Exercises Other Exercises Other Exercises: Pt instructed and performed B LE exercises including SLR, LAQ, marching x 17 with verbal cuing for technique as well as sit to stands x7 with verbal cuing for hand placement and safety Other Exercises: Pt instructed in BUE ther-ex with theraband for shoulder flexion/extension, abduction, elbow extension/flexion, and shoulder external rotation, 1x7 BUE. Minimal cues for technique  after visual and verbal instruction    General Comments General comments (skin integrity, edema, etc.): pitting edema, worst in RUE, RN notified      Pertinent Vitals/Pain Pain Assessment: No/denies pain Pain Intervention(s): Monitored during session    Home Living                      Prior Function            PT Goals (current goals can now be found in the care plan section) Acute Rehab PT Goals Patient Stated Goal: get better and go home PT Goal Formulation: With patient Time For Goal Achievement: 09/17/17 Potential to Achieve Goals: Good Progress towards PT goals: Progressing toward  goals    Frequency    Min 2X/week      PT Plan Current plan remains appropriate    Co-evaluation              AM-PAC PT "6 Clicks" Daily Activity  Outcome Measure  Difficulty turning over in bed (including adjusting bedclothes, sheets and blankets)?: Unable Difficulty moving from lying on back to sitting on the side of the bed? : Unable Difficulty sitting down on and standing up from a chair with arms (e.g., wheelchair, bedside commode, etc,.)?: Unable Help needed moving to and from a bed to chair (including a wheelchair)?: A Little Help needed walking in hospital room?: A Lot Help needed climbing 3-5 steps with a railing? : Total 6 Click Score: 9    End of Session Equipment Utilized During Treatment: Gait belt Activity Tolerance: Patient tolerated treatment well Patient left: in chair;with call bell/phone within reach;with chair alarm set;with family/visitor present   PT Visit Diagnosis: Unsteadiness on feet (R26.81);Other abnormalities of gait and mobility (R26.89);Muscle weakness (generalized) (M62.81);Ataxic gait (R26.0);Difficulty in walking, not elsewhere classified (R26.2)     Time: 9784-7841 PT Time Calculation (min) (ACUTE ONLY): 26 min  Charges:  $Therapeutic Exercise: 8-22 mins $Therapeutic Activity: 8-22 mins                    G Codes:      Bonnie Holt, SPT    Bonnie Holt 09/10/2017, 5:03 PM

## 2017-09-10 NOTE — Care Management (Addendum)
Outpatient hemodialysis referral is being initiated so will anticipate need for treatments post discharge. Most likely will have final decision by 7/1.  Clinic: [facility].  It does not appear patient has sat for an entire treatment.  Discussed with primary nurse. Spoke with patient's husband about discharge disposition.  At present, leaning towards skilled nursing facility for "one - two weeks."  Discussed if decided to discharge directly home from the hospital that could receive home health services.  Discussed that [employer] has discontinued co pays for home health visits. No agency preference.  Patient has a walker at home "somewhere" that belonged to her mother.  Would anticipate need for home health RN aide, PT OT and SW.  Equipment needs may entail Hospital bed, bedside commode, ?walker. No agency preference. Heads up called to [facility]. PPD and hepatitis results available for [facility] with [employer]  Results for CAYLEA, FORONDA (MRN [id number]) as of 09/10/2017 13:19  Ref. Range 08/29/2017 15:52  Hepatitis B Surface Ag Latest Ref Range: Negative  Negative  Hep B S Ab Unknown Reactive  Hep B Core Ab, Tot Latest Ref Range: Negative  Negative

## 2017-09-10 NOTE — Progress Notes (Signed)
Occupational Therapy Treatment Patient Details Name: Bonnie Holt MRN: [id number] DOB: 01-03-1946 Today's Date: 09/10/2017    History of present illness Pt admitted to hospital on 08/22/17 and has been diagnosed with sepsis secondary to UTI, acute renal failure, and acute respiratory failure. Pts hospital stay has been complicated by fall on 0/86/57 intubation on 08/31/17 and extubation on 09/02/17. Pt has a past medical history that includes breast cancer, coronary artery disease, and HTN. Pt currently has tunneled L IJ catheter for HD.    OT comments  Pt seen for OT tx this date. Pt eager to work with therapist and requesting to use the bathroom promptly upon entry. BSC brought nearby due to urgency of need. Cues for RW safety and CGA provided for pt to complete STS t/f from recliner ambulate approx 5 feet and perform toilet transfer to [facility]. Pt + for large BM. Required max assist for pericare while pt stood with B forearms resting on the RW. Once back in the recliner, pt agreeable to there-ex. Pt completed 1x7 for BUE exercises with theraband as described below. Pt eager to progress with therapy so she can go home by July 4th and have "strawberry ice cream with pound cake on the holiday", a family tradition. Will continue to progress and will re-assess appropriateness of HHOT services next session.   Follow Up Recommendations  SNF    Equipment Recommendations       Recommendations for Other Services      Precautions / Restrictions Precautions Precautions: Fall;Other (comment) Precaution Comments: R IJ HD permcath now Restrictions Weight Bearing Restrictions: No       Mobility Bed Mobility               General bed mobility comments: deferred, up in recliner  Transfers Overall transfer level: Needs assistance Equipment used: Rolling walker (2 wheeled) Transfers: Sit to/from Stand Sit to Stand: Min guard;Min assist         General transfer comment: cues for hand  placement    Balance Overall balance assessment: Needs assistance   Sitting balance-Leahy Scale: Good       Standing balance-Leahy Scale: Fair                             ADL either performed or assessed with clinical judgement   ADL Overall ADL's : Needs assistance/impaired Eating/Feeding: Independent;Sitting   Grooming: Sitting;Set up   Upper Body Bathing: Sitting;Minimal assistance   Lower Body Bathing: Sit to/from stand;Minimal assistance;Moderate assistance   Upper Body Dressing : Sitting;Min guard   Lower Body Dressing: Sit to/from stand;Minimal assistance;Moderate assistance Lower Body Dressing Details (indicate cue type and reason): difficulty reaching down to feet, with min-mod assist to support her RLE across other leg, pt able to pull up socks Toilet Transfer: BSC;RW;Ambulation;Cueing for safety;Min guard Toilet Transfer Details (indicate cue type and reason): verbal cues to keep RW in front of her and for hand placement Toileting- Clothing Manipulation and Hygiene: Maximal assistance;Sit to/from stand Toileting - Clothing Manipulation Details (indicate cue type and reason): large BM, max assist to perform pericare while pt able to stand with forearms resting on RW             Vision Baseline Vision/History: Wears glasses Wears Glasses: At all times Patient Visual Report: No change from baseline     Perception     Praxis      Cognition Arousal/Alertness: Awake/alert Behavior During Therapy: [facility] for  tasks assessed/performed Overall Cognitive Status: Within Functional Limits for tasks assessed                                 General Comments: overall WFL, min cues for safety during session and additional time/simple cues for performing novel BUE exercises with the theraband        Exercises Other Exercises Other Exercises: Pt instructed in BUE ther-ex with theraband for shoulder flexion/extension, abduction, elbow  extension/flexion, and shoulder external rotation, 1x7 BUE. Minimal cues for technique after visual and verbal instruction   Shoulder Instructions       General Comments pitting edema, worst in RUE, RN notified    Pertinent Vitals/ Pain       Pain Assessment: No/denies pain Pain Intervention(s): Monitored during session  Home Living                                          Prior Functioning/Environment              Frequency  Min 2X/week        Progress Toward Goals  OT Goals(current goals can now be found in the care plan section)  Progress towards OT goals: Progressing toward goals  Acute Rehab OT Goals Patient Stated Goal: get better and go home OT Goal Formulation: With patient/family Time For Goal Achievement: 09/18/17 Potential to Achieve Goals: Good  Plan Discharge plan remains appropriate;Frequency remains appropriate    Co-evaluation                 AM-PAC PT "6 Clicks" Daily Activity     Outcome Measure   Help from another person eating meals?: None Help from another person taking care of personal grooming?: A Little Help from another person toileting, which includes using toliet, bedpan, or urinal?: A Lot Help from another person bathing (including washing, rinsing, drying)?: A Lot Help from another person to put on and taking off regular upper body clothing?: A Little Help from another person to put on and taking off regular lower body clothing?: A Lot 6 Click Score: 16    End of Session Equipment Utilized During Treatment: Rolling walker;Gait belt  OT Visit Diagnosis: Other abnormalities of gait and mobility (R26.89);Other symptoms and signs involving cognitive function   Activity Tolerance Patient tolerated treatment well   Patient Left in chair;with call bell/phone within reach;with chair alarm set;with family/visitor present   Nurse Communication          Time: 1310-1333 OT Time Calculation (min): 23  min  Charges: OT General Charges $OT Visit: 1 Visit OT Treatments $Self Care/Home Management : 8-22 mins $Therapeutic Exercise: 8-22 mins  Jeni Salles, MPH, MS, OTR/L ascom [phone] 09/10/17, 2:09 PM

## 2017-09-10 NOTE — Progress Notes (Signed)
Patient ID: Bonnie Holt, female   DOB: 03/31/1945, 72 y.o.   MRN: [id number]    [employer] PROGRESS NOTE  [name]:[id number] DOB: [birth year] DOA: 08/22/2017 PCP: Kirk Ruths, MD  HPI/Subjective: Patient every day feeling a little bit better.  Had PermCath placed yesterday right chest.   Objective: Vitals:   09/10/17 0316 09/10/17 0739  BP: (!) 181/60 (!) 154/42  Pulse: 87 98  Resp: 18   Temp: 98.4 F (36.9 C) 98 F (36.7 C)  SpO2: 92% 94%    Filed Weights   09/09/17 1430 09/09/17 1626 09/10/17 0316  Weight: 116.6 kg (257 lb) 116.6 kg (257 lb) 106.5 kg (234 lb 12.8 oz)    ROS: Review of Systems  Constitutional: Negative for chills and fever.  Eyes: Negative for blurred vision.  Respiratory: Negative for cough and shortness of breath.   Cardiovascular: Negative for chest pain.  Gastrointestinal: Positive for diarrhea. Negative for abdominal pain, constipation, nausea and vomiting.  Genitourinary: Negative for dysuria.  Musculoskeletal: Negative for joint pain.  Neurological: Negative for dizziness and headaches.   Exam: Physical Exam  HENT:  Nose: No mucosal edema.  Mouth/Throat: No oropharyngeal exudate or posterior oropharyngeal edema.  Eyes: Pupils are equal, round, and reactive to light. Conjunctivae, EOM and lids are normal.  Neck: No JVD present. Carotid bruit is not present. No edema present. No thyroid mass and no thyromegaly present.  Cardiovascular: S1 normal and S2 normal. Exam reveals no gallop.  No murmur heard. Pulses:      Dorsalis pedis pulses are 2+ on the right side, and 2+ on the left side.  Respiratory: No respiratory distress. She has decreased breath sounds in the right lower field and the left lower field. She has no wheezes. She has no rhonchi. She has no rales.  GI: Soft. Bowel sounds are normal. There is no tenderness.  Musculoskeletal:       Right ankle: She exhibits swelling.       Left ankle: She exhibits  swelling.  Lymphadenopathy:    She has no cervical adenopathy.  Neurological: She is alert. No cranial nerve deficit.  Able to straight leg raise bilaterally.  Skin: Skin is warm. No rash noted. Nails show no clubbing.  Psychiatric: She has a normal mood and affect.      Data Reviewed: Basic Metabolic Panel: Recent Labs  Lab 09/06/17 0430 09/07/17 0424 09/08/17 0353 09/09/17 0908 09/10/17 0828  NA 140 140 140 139 139  K 3.7 3.3* 3.5 3.3* 3.5  CL 98* 100 101 102 102  CO2 27 28 28 26 27   GLUCOSE 118* 123* 104* 143* 128*  BUN 51* 34* 41* 48* 26*  CREATININE 5.69* 4.37* 5.41* 6.40* 4.07*  CALCIUM 8.3* 8.2* 8.2* 8.6* 8.6*  PHOS 7.8* 5.9* 7.3* 6.9* 3.5   Liver Function Tests: Recent Labs  Lab 09/06/17 0430 09/07/17 0424 09/08/17 0353 09/09/17 0908 09/10/17 0828  ALBUMIN 2.5* 2.3* 2.5* 2.4* 2.5*   CBC: Recent Labs  Lab 09/05/17 0650 09/06/17 0431  09/06/17 1525 09/07/17 0424 09/07/17 1940 09/08/17 0353 09/09/17 1136  WBC 9.7 9.2  --   --  5.8  --  6.5 5.8  NEUTROABS  --   --   --   --  4.3  --  4.6  --   HGB 9.1* 8.2*   < > 7.7* 5.9* 9.9* 10.1* 9.6*  HCT 26.5* 24.4*   < > 23.3* 17.8* 28.7* 29.1* 28.5*  MCV 92.6 93.4  --   --  93.7  --  91.2 92.3  PLT 153 150  --   --  125*  --  154 163   < > = values in this interval not displayed.   Is CBG: Recent Labs  Lab 09/09/17 1742 09/09/17 2043 09/09/17 2244 09/10/17 0741 09/10/17 1122  GLUCAP 107* 233* 209* 121* 190*    Recent Results (from the past 240 hour(s))  C difficile quick scan w PCR reflex     Status: None   Collection Time: 09/02/17 12:31 PM  Result Value Ref Range Status   C Diff antigen NEGATIVE NEGATIVE Final   C Diff toxin NEGATIVE NEGATIVE Final   C Diff interpretation No C. difficile detected.  Final    Comment: Performed at [facility], [address] [place], [place] [address]  CULTURE, BLOOD (ROUTINE X 2) w Reflex to ID Panel     Status: None   Collection Time: 09/02/17   7:25 PM  Result Value Ref Range Status   Specimen Description BLOOD BLOOD RIGHT HAND  Final   Special Requests   Final    BOTTLES DRAWN AEROBIC AND ANAEROBIC Blood Culture adequate volume   Culture   Final    NO GROWTH 5 DAYS Performed at [facility], [address] [place], [place] [address]    Report Status 09/07/2017 FINAL  Final  CULTURE, BLOOD (ROUTINE X 2) w Reflex to ID Panel     Status: None   Collection Time: 09/02/17  7:25 PM  Result Value Ref Range Status   Specimen Description BLOOD BLOOD RIGHT HAND  Final   Special Requests   Final    BOTTLES DRAWN AEROBIC AND ANAEROBIC Blood Culture results may not be optimal due to an inadequate volume of blood received in culture bottles   Culture   Final    NO GROWTH 5 DAYS Performed at [facility], [address] [place], [place] [address]    Report Status 09/07/2017 FINAL  Final  Urine Culture     Status: Abnormal   Collection Time: 09/03/17  4:23 AM  Result Value Ref Range Status   Specimen Description   Final    URINE, RANDOM Performed at [facility], [address] [place], [place] [address]    Special Requests   Final    NONE Performed at [facility], [address] [place], [place] [address]    Culture (A)  Final    >=100,000 COLONIES/mL DIPHTHEROIDS(CORYNEBACTERIUM SPECIES) Standardized susceptibility testing for this organism is not available. Performed at [facility], [place] [address] [place],  [address]    Report Status 09/04/2017 FINAL  Final      Scheduled Meds: . amiodarone  200 mg Oral BID  . atorvastatin  80 mg Oral q1800  . chlorhexidine  15 mL Mouth Rinse BID  . Chlorhexidine Gluconate Cloth  6 each Topical Q0600  . cholecalciferol  5,000 Units Oral Daily  . [START ON 09/11/2017] epoetin (EPOGEN/PROCRIT) injection  10,000 Units Intravenous Q T,Th,Sa-HD  . feeding supplement (NEPRO CARB STEADY)  237 mL Oral BID BM  . insulin aspart  0-5 Units  Subcutaneous QHS  . insulin aspart  0-9 Units Subcutaneous TID WC  . insulin glargine  10 Units Subcutaneous Daily  . letrozole  2.5 mg Oral Daily  . mouth rinse  15 mL Mouth Rinse q12n4p  . Melatonin  10 mg Oral QHS  . metoprolol succinate  50 mg Oral Daily  . multivitamin  1 tablet Oral QHS  . nystatin cream   Topical BID  . pantoprazole  40 mg Oral QAC breakfast  . sodium chloride flush  10-40 mL Intracatheter Q12H   Continuous Infusions:   Assessment/Plan:  1. Sepsis with multiorgan failure and initial hypotension.  Likely combination of urinary tract infection versus tickborne disease.  Finished antibiotics. 2. Acute hypoxic respiratory failure with fluid overload.  Patient extubated on last Thursday.  Tapered down on nasal cannula 2 L.  Will check pulse ox on room air tomorrow morning. 3. Acute kidney injury.   permacath placed yesterday.  Spoke with nephrology and dialysis tomorrow.  They were looking into outpatient slots and rehab. 4. Acute blood loss anemia with upper GI bleed.  Endoscopy showing irritation from NG tube.  NG tube removed.  Patient on po Protonix.  Hemoglobin responded well to transfusion.  Last hemoglobin 9.6. 5. Diarrhea.  Rectal tube is out for 2 days.  Will hold off on slowing this down secondary to recent ileus. 6. Elevated troponin demand ischemia in the setting of sepsis. 7. Atrial fibrillation with rapid ventricular response.  On amiodarone oral tablets.  Currently in normal sinus rhythm.  No blood thinners with recent GI bleed.  8. Thrombocytopenia secondary to sepsis.  last platelet count 163.  9. History of breast cancer on Femara 10. Morbid obesity weight loss needed 11. History of sleep apnea on BiPAP at home  12. type 2 diabetes on sliding scale 13. Weakness.  Physical therapy needed daily to get her stronger. 14. Essential hypertension restart Toprol  Code Status:     Code Status Orders  (From admission, onward)        Start     Ordered    08/22/17 1612  Full code  Continuous     08/22/17 1611    Code Status History    This patient has a current code status but no historical code status.     Family Communication: Husband at the bedside Disposition Plan: Likely will need rehab  Consultants:  Nephrology  Time spent: 27 minutes.  Case discussed with nephrology.  Enes Rokosz [employer]

## 2017-09-10 NOTE — Clinical Social Work Note (Signed)
CSW spoke with patient and her husband to present bed offers.  Patient and her husband will look at offers and discuss with patient's sister which SNF they would like to consider.  CSW informed them to notify social worker over the weekend or they can let CSW on Monday which facility they have decided if they are agreeable.  Case manager has been made aware that patient may not agree to SNF.  CSW to continue to follow patient's progress throughout discharge planning.  Jones Broom. [place], MSW, [place]  09/10/2017 5:10 PM

## 2017-09-11 LAB — GLUCOSE, CAPILLARY
GLUCOSE-CAPILLARY: 152 mg/dL — AB (ref 70–99)
GLUCOSE-CAPILLARY: 184 mg/dL — AB (ref 70–99)
Glucose-Capillary: 275 mg/dL — ABNORMAL HIGH (ref 70–99)
Glucose-Capillary: 162 mg/dL — ABNORMAL HIGH (ref 70–99)

## 2017-09-11 LAB — RENAL FUNCTION PANEL
ALBUMIN: 2.4 g/dL — AB (ref 3.5–5.0)
Anion gap: 9 (ref 5–15)
BUN: 32 mg/dL — AB (ref 8–23)
CALCIUM: 8.5 mg/dL — AB (ref 8.9–10.3)
CO2: 27 mmol/L (ref 22–32)
CREATININE: 4.91 mg/dL — AB (ref 0.44–1.00)
Chloride: 101 mmol/L (ref 98–111)
GFR, EST AFRICAN AMERICAN: 9 mL/min — AB (ref >60)
GFR, EST NON AFRICAN AMERICAN: 8 mL/min — AB (ref >60)
Glucose, Bld: 147 mg/dL — ABNORMAL HIGH (ref 70–99)
PHOSPHORUS: 3.8 mg/dL (ref 2.5–4.6)
Potassium: 3.3 mmol/L — ABNORMAL LOW (ref 3.5–5.1)
Sodium: 137 mmol/L (ref 135–145)

## 2017-09-11 NOTE — Progress Notes (Signed)
Patient ID: Bonnie Holt, female   DOB: [birth year], 72 y.o.   MRN: [id number]    [employer] PROGRESS NOTE  Bonnie Holt HCW:[id number] DOB: [birth year] DOA: 08/22/2017 PCP: Kirk Ruths, MD  HPI/Subjective: Feeling better states that she had some shortness of breath earlier   Objective: Vitals:   09/11/17 0949 09/11/17 0950  BP:    Pulse:    Resp:    Temp:    SpO2: (!) 86% 95%    Filed Weights   09/09/17 1626 09/10/17 0316 09/11/17 0323  Weight: 116.6 kg (257 lb) 106.5 kg (234 lb 12.8 oz) 107.8 kg (237 lb 9.6 oz)    ROS: Review of Systems  Constitutional: Negative for chills and fever.  Eyes: Negative for blurred vision.  Respiratory: Negative for cough and shortness of breath.   Cardiovascular: Negative for chest pain.  Gastrointestinal: Positive for diarrhea. Negative for abdominal pain, constipation, nausea and vomiting.  Genitourinary: Negative for dysuria.  Musculoskeletal: Negative for joint pain.  Neurological: Negative for dizziness and headaches.   Exam: Physical Exam  HENT:  Nose: No mucosal edema.  Mouth/Throat: No oropharyngeal exudate or posterior oropharyngeal edema.  Eyes: Pupils are equal, round, and reactive to light. Conjunctivae, EOM and lids are normal.  Neck: No JVD present. Carotid bruit is not present. No edema present. No thyroid mass and no thyromegaly present.  Cardiovascular: S1 normal and S2 normal. Exam reveals no gallop.  No murmur heard. Pulses:      Dorsalis pedis pulses are 2+ on the right side, and 2+ on the left side.  Respiratory: No respiratory distress. She has decreased breath sounds in the right lower field and the left lower field. She has no wheezes. She has no rhonchi. She has no rales.  GI: Soft. Bowel sounds are normal. There is no tenderness.  Musculoskeletal:       Right ankle: She exhibits swelling.       Left ankle: She exhibits swelling.  Lymphadenopathy:    She has no cervical adenopathy.   Neurological: She is alert. No cranial nerve deficit.  Able to straight leg raise bilaterally.  Skin: Skin is warm. No rash noted. Nails show no clubbing.  Psychiatric: She has a normal mood and affect.      Data Reviewed: Basic Metabolic Panel: Recent Labs  Lab 09/07/17 0424 09/08/17 0353 09/09/17 0908 09/10/17 0828 09/11/17 0459  NA 140 140 139 139 137  K 3.3* 3.5 3.3* 3.5 3.3*  CL 100 101 102 102 101  CO2 28 28 26 27 27   GLUCOSE 123* 104* 143* 128* 147*  BUN 34* 41* 48* 26* 32*  CREATININE 4.37* 5.41* 6.40* 4.07* 4.91*  CALCIUM 8.2* 8.2* 8.6* 8.6* 8.5*  PHOS 5.9* 7.3* 6.9* 3.5 3.8   Liver Function Tests: Recent Labs  Lab 09/07/17 0424 09/08/17 0353 09/09/17 0908 09/10/17 0828 09/11/17 0459  ALBUMIN 2.3* 2.5* 2.4* 2.5* 2.4*   CBC: Recent Labs  Lab 09/05/17 0650 09/06/17 0431  09/06/17 1525 09/07/17 0424 09/07/17 1940 09/08/17 0353 09/09/17 1136  WBC 9.7 9.2  --   --  5.8  --  6.5 5.8  NEUTROABS  --   --   --   --  4.3  --  4.6  --   HGB 9.1* 8.2*   < > 7.7* 5.9* 9.9* 10.1* 9.6*  HCT 26.5* 24.4*   < > 23.3* 17.8* 28.7* 29.1* 28.5*  MCV 92.6 93.4  --   --  93.7  --  91.2 92.3  PLT 153 150  --   --  125*  --  154 163   < > = values in this interval not displayed.   Is CBG: Recent Labs  Lab 09/10/17 1122 09/10/17 1639 09/10/17 2027 09/11/17 0759 09/11/17 1156  GLUCAP 190* 174* 127* 152* 275*    Recent Results (from the past 240 hour(s))  C difficile quick scan w PCR reflex     Status: None   Collection Time: 09/02/17 12:31 PM  Result Value Ref Range Status   C Diff antigen NEGATIVE NEGATIVE Final   C Diff toxin NEGATIVE NEGATIVE Final   C Diff interpretation No C. difficile detected.  Final    Comment: Performed at [facility], [address] [place], [place] [address]  CULTURE, BLOOD (ROUTINE X 2) w Reflex to ID Panel     Status: None   Collection Time: 09/02/17  7:25 PM  Result Value Ref Range Status   Specimen Description  BLOOD BLOOD RIGHT HAND  Final   Special Requests   Final    BOTTLES DRAWN AEROBIC AND ANAEROBIC Blood Culture adequate volume   Culture   Final    NO GROWTH 5 DAYS Performed at [facility], [address] [place], [place] [address]    Report Status 09/07/2017 FINAL  Final  CULTURE, BLOOD (ROUTINE X 2) w Reflex to ID Panel     Status: None   Collection Time: 09/02/17  7:25 PM  Result Value Ref Range Status   Specimen Description BLOOD BLOOD RIGHT HAND  Final   Special Requests   Final    BOTTLES DRAWN AEROBIC AND ANAEROBIC Blood Culture results may not be optimal due to an inadequate volume of blood received in culture bottles   Culture   Final    NO GROWTH 5 DAYS Performed at [facility], [address], [place], [place] [address]    Report Status 09/07/2017 FINAL  Final  Urine Culture     Status: Abnormal   Collection Time: 09/03/17  4:23 AM  Result Value Ref Range Status   Specimen Description   Final    URINE, RANDOM Performed at [facility], [address] [place], [place] [address]    Special Requests   Final    NONE Performed at [facility], [address] [place], [place] [address]    Culture (A)  Final    >=100,000 COLONIES/mL DIPHTHEROIDS(CORYNEBACTERIUM SPECIES) Standardized susceptibility testing for this organism is not available. Performed at [facility], [place] [address], [place],  [address]    Report Status 09/04/2017 FINAL  Final      Scheduled Meds: . amiodarone  200 mg Oral BID  . atorvastatin  80 mg Oral q1800  . chlorhexidine  15 mL Mouth Rinse BID  . Chlorhexidine Gluconate Cloth  6 each Topical Q0600  . cholecalciferol  5,000 Units Oral Daily  . epoetin (EPOGEN/PROCRIT) injection  10,000 Units Intravenous Q T,Th,Sa-HD  . feeding supplement (NEPRO CARB STEADY)  237 mL Oral BID BM  . insulin aspart  0-5 Units Subcutaneous QHS  . insulin aspart  0-9 Units Subcutaneous TID WC  . insulin  glargine  10 Units Subcutaneous Daily  . letrozole  2.5 mg Oral Daily  . mouth rinse  15 mL Mouth Rinse q12n4p  . Melatonin  10 mg Oral QHS  . metoprolol succinate  50 mg Oral Daily  . multivitamin  1 tablet Oral QHS  .  nystatin cream   Topical BID  . pantoprazole  40 mg Oral QAC breakfast  . sodium chloride flush  10-40 mL Intracatheter Q12H   Continuous Infusions:   Assessment/Plan:  1. Sepsis with multiorgan failure and initial hypotension.  Likely combination of urinary tract infection versus tickborne disease.  Finished antibiotics. 2. Acute hypoxic respiratory failure with fluid overload.  Patient extubated on last Thursday.  Tapered down on nasal cannula 2 L.  Add incentive spirometry 3. Acute kidney injury.   permacath placed   They were looking into outpatient slots and rehab.  Making good urine output 4. Acute blood loss anemia with upper GI bleed.  Endoscopy showing irritation from NG tube.  NG tube removed.  Patient on po Protonix.  Hemoglobin responded well to transfusion.  Last hemoglobin 9.6. 5. Diarrhea.  Rectal tube is out for 2 days.  Will hold off on slowing this down secondary to recent ileus. 6. Elevated troponin demand ischemia in the setting of sepsis. 7. Atrial fibrillation with rapid ventricular response.  On amiodarone oral tablets.  Currently in normal sinus rhythm.  No blood thinners with recent GI bleed.  8. Thrombocytopenia secondary to sepsis.  last platelet count 163.  9. History of breast cancer on Femara 10. Morbid obesity weight loss needed 11. History of sleep apnea on BiPAP at home  12. type 2 diabetes on sliding scale 13. Weakness.  Physical therapy needed daily to get her stronger.  Patient will need rehab 14. Essential hypertension continue Toprol  Code Status:     Code Status Orders  (From admission, onward)        Start     Ordered   08/22/17 1612  Full code  Continuous     08/22/17 1611    Code Status History    This patient has a  current code status but no historical code status.     Family Communication: Husband at the bedside Disposition Plan: Likely will need rehab  Consultants:  Nephrology  Time spent: 27 minutes.  Case discussed with nephrology.  Alaney Witter [employer]

## 2017-09-11 NOTE — Progress Notes (Signed)
Physical Therapy Treatment Patient Details Name: Bonnie Holt MRN: [id number] DOB: 1945/06/29 Today's Date: 09/11/2017    History of Present Illness Pt admitted to hospital on 08/22/17 and has been diagnosed with sepsis secondary to UTI, acute renal failure, and acute respiratory failure. Pts hospital stay has been complicated by fall on 3/81/01 intubation on 08/31/17 and extubation on 09/02/17. Pt has a past medical history that includes breast cancer, coronary artery disease, and HTN. Pt currently has tunneled L IJ catheter for HD.     PT Comments    Pt sitting EOB upon arrival.  Pt stated she had recently walked to/from bathroom with nursing staff and was generally fatigued.  Pt agreed to attempt gait but once standing to walker, she requested to sit.  On second attempt after an extended rest period, she remained fatigue with some nausea and requested just to get up to chair.  Transferred with min guard.  Back and leg pain also were limiting factor in session.     Follow Up Recommendations  SNF     Equipment Recommendations       Recommendations for Other Services       Precautions / Restrictions Precautions Precautions: Fall;Other (comment) Precaution Comments: R IJ HD permcath now Restrictions Weight Bearing Restrictions: No    Mobility  Bed Mobility               General bed mobility comments: sitting EOB upon arrival  Transfers Overall transfer level: Needs assistance Equipment used: Rolling walker (2 wheeled) Transfers: Sit to/from Stand Sit to Stand: Min assist            Ambulation/Gait Ambulation/Gait assistance: Min Web designer (Feet): 3 Feet Assistive device: Rolling walker (2 wheeled) Gait Pattern/deviations: Shuffle   Gait velocity interpretation: <1.8 ft/sec, indicate of risk for recurrent falls General Gait Details: Stated she walked to/from BR with nursing prior to session.  Fatigued with increased back and LE pain, declined further  gait.   Stairs             Wheelchair Mobility    Modified Rankin (Stroke Patients Only)       Balance Overall balance assessment: Needs assistance   Sitting balance-Leahy Scale: Good Sitting balance - Comments: able to sit unsupported EOB without UE support or LOB     Standing balance-Leahy Scale: Fair Standing balance comment: pt able to stand with B UE support on RW without gross LOB.                            Cognition Arousal/Alertness: Awake/alert Behavior During Therapy: WFL for tasks assessed/performed Overall Cognitive Status: Within Functional Limits for tasks assessed                                        Exercises      General Comments        Pertinent Vitals/Pain Pain Assessment: Faces Faces Pain Scale: Hurts even more Pain Location: back and LE's Pain Descriptors / Indicators: Aching Pain Intervention(s): Limited activity within patient's tolerance;Monitored during session    Home Living                      Prior Function            PT Goals (current goals can now be found in the care  plan section) Progress towards PT goals: Progressing toward goals    Frequency    Min 2X/week      PT Plan Current plan remains appropriate    Co-evaluation              AM-PAC PT "6 Clicks" Daily Activity  Outcome Measure  Difficulty turning over in bed (including adjusting bedclothes, sheets and blankets)?: Unable Difficulty moving from lying on back to sitting on the side of the bed? : Unable Difficulty sitting down on and standing up from a chair with arms (e.g., wheelchair, bedside commode, etc,.)?: Unable Help needed moving to and from a bed to chair (including a wheelchair)?: A Little Help needed walking in hospital room?: A Lot Help needed climbing 3-5 steps with a railing? : Total 6 Click Score: 9    End of Session Equipment Utilized During Treatment: Gait belt Activity Tolerance:  Patient limited by fatigue;Patient limited by pain Patient left: in chair;with chair alarm set;with call bell/phone within reach;with family/visitor present         Time: 2778-2423 PT Time Calculation (min) (ACUTE ONLY): 19 min  Charges:  $Therapeutic Activity: 8-22 mins                    G Codes:       Chesley Noon, PTA 09/11/17, 10:40 AM

## 2017-09-11 NOTE — Plan of Care (Signed)
  Problem: Education: Goal: Knowledge of General Education information will improve 09/11/2017 0242 by Liliane Channel, RN Outcome: Progressing 09/11/2017 0242 by Liliane Channel, RN Outcome: Progressing   Problem: Fluid Volume: Goal: Hemodynamic stability will improve Outcome: Progressing

## 2017-09-11 NOTE — Progress Notes (Signed)
[facility]  ROUNDING NOTE   Subjective:   Husband at bedside.   UOP 1875  Breathing room air  Objective:  Vital signs in last 24 hours:  Temp:  [98.3 F (36.8 C)-99.3 F (37.4 C)] 98.3 F (36.8 C) (06/29 0323) Pulse Rate:  [73-91] 73 (06/29 0800) Resp:  [14-18] 14 (06/29 0800) BP: (135-158)/(55-68) 158/55 (06/29 0800) SpO2:  [92 %-100 %] 92 % (06/29 0909) Weight:  [107.8 kg (237 lb 9.6 oz)] 107.8 kg (237 lb 9.6 oz) (06/29 0323)  Weight change: -8.8 kg (-19 lb 6.4 oz) Filed Weights   09/09/17 1626 09/10/17 0316 09/11/17 0323  Weight: 116.6 kg (257 lb) 106.5 kg (234 lb 12.8 oz) 107.8 kg (237 lb 9.6 oz)    Intake/Output: I/O last 3 completed shifts: In: 5 [P.O.:960; I.V.:10] Out: 1876 [Urine:1875; Stool:1]   Intake/Output this shift:  Total I/O In: -  Out: 300 [Urine:300]  Physical Exam: General: Laying in bed  Head: [place]/AT  Eyes: Anicteric, PERRL  Neck: Supple, trachea midline  Lungs:  clear  Heart: Irregular rhythm  Abdomen:  Obese, nontender  Extremities: trace peripheral edema.  Neurologic: Nonfocal, moving all four extremities  Skin: No lesions  Access: RIJ permcath    Basic Metabolic Panel: Recent Labs  Lab 09/07/17 0424 09/08/17 0353 09/09/17 0908 09/10/17 0828 09/11/17 0459  NA 140 140 139 139 137  K 3.3* 3.5 3.3* 3.5 3.3*  CL 100 101 102 102 101  CO2 28 28 26 27 27   GLUCOSE 123* 104* 143* 128* 147*  BUN 34* 41* 48* 26* 32*  CREATININE 4.37* 5.41* 6.40* 4.07* 4.91*  CALCIUM 8.2* 8.2* 8.6* 8.6* 8.5*  PHOS 5.9* 7.3* 6.9* 3.5 3.8    Liver Function Tests: Recent Labs  Lab 09/07/17 0424 09/08/17 0353 09/09/17 0908 09/10/17 0828 09/11/17 0459  ALBUMIN 2.3* 2.5* 2.4* 2.5* 2.4*   No results for input(s): LIPASE, AMYLASE in the last 168 hours. No results for input(s): AMMONIA in the last 168 hours.  CBC: Recent Labs  Lab 09/05/17 0650 09/06/17 0431  09/06/17 1525 09/07/17 0424 09/07/17 1940 09/08/17 0353  09/09/17 1136  WBC 9.7 9.2  --   --  5.8  --  6.5 5.8  NEUTROABS  --   --   --   --  4.3  --  4.6  --   HGB 9.1* 8.2*   < > 7.7* 5.9* 9.9* 10.1* 9.6*  HCT 26.5* 24.4*   < > 23.3* 17.8* 28.7* 29.1* 28.5*  MCV 92.6 93.4  --   --  93.7  --  91.2 92.3  PLT 153 150  --   --  125*  --  154 163   < > = values in this interval not displayed.    Cardiac Enzymes: No results for input(s): CKTOTAL, CKMB, CKMBINDEX, TROPONINI in the last 168 hours.  BNP: Invalid input(s): POCBNP  CBG: Recent Labs  Lab 09/10/17 0741 09/10/17 1122 09/10/17 1639 09/10/17 2027 09/11/17 0759  GLUCAP 121* 190* 174* 127* 152*    Microbiology: Results for orders placed or performed during the hospital encounter of 08/22/17  Blood Culture (routine x 2)     Status: None   Collection Time: 08/22/17 12:52 PM  Result Value Ref Range Status   Specimen Description BLOOD RIGHT HAND  Final   Special Requests   Final    BOTTLES DRAWN AEROBIC AND ANAEROBIC Blood Culture adequate volume   Culture   Final    NO GROWTH 5 DAYS  Performed at [facility], [address] [place], [place] [address]    Report Status 08/27/2017 FINAL  Final  Urine culture     Status: Abnormal   Collection Time: 08/22/17 12:52 PM  Result Value Ref Range Status   Specimen Description   Final    URINE, RANDOM Performed at [facility], [address] [place], [place] [address]    Special Requests   Final    NONE Performed at [facility], [address] [place], [place] [address]    Culture (A)  Final    >=100,000 COLONIES/mL KLEBSIELLA PNEUMONIAE 60,000 COLONIES/mL PROTEUS MIRABILIS    Report Status 08/24/2017 FINAL  Final   Organism ID, Bacteria KLEBSIELLA PNEUMONIAE (A)  Final   Organism ID, Bacteria PROTEUS MIRABILIS (A)  Final      Susceptibility   Klebsiella pneumoniae - MIC*    AMPICILLIN RESISTANT Resistant     CEFAZOLIN <=4 SENSITIVE Sensitive     CEFTRIAXONE <=1 SENSITIVE Sensitive      CIPROFLOXACIN <=0.25 SENSITIVE Sensitive     GENTAMICIN <=1 SENSITIVE Sensitive     IMIPENEM <=0.25 SENSITIVE Sensitive     NITROFURANTOIN 64 INTERMEDIATE Intermediate     TRIMETH/SULFA <=20 SENSITIVE Sensitive     AMPICILLIN/SULBACTAM 4 SENSITIVE Sensitive     PIP/TAZO <=4 SENSITIVE Sensitive     Extended ESBL NEGATIVE Sensitive     * >=100,000 COLONIES/mL KLEBSIELLA PNEUMONIAE   Proteus mirabilis - MIC*    AMPICILLIN <=2 SENSITIVE Sensitive     CEFAZOLIN <=4 SENSITIVE Sensitive     CEFTRIAXONE <=1 SENSITIVE Sensitive     CIPROFLOXACIN <=0.25 SENSITIVE Sensitive     GENTAMICIN <=1 SENSITIVE Sensitive     IMIPENEM 1 SENSITIVE Sensitive     NITROFURANTOIN 128 RESISTANT Resistant     TRIMETH/SULFA <=20 SENSITIVE Sensitive     AMPICILLIN/SULBACTAM <=2 SENSITIVE Sensitive     PIP/TAZO <=4 SENSITIVE Sensitive     * 60,000 COLONIES/mL PROTEUS MIRABILIS  Blood Culture (routine x 2)     Status: None   Collection Time: 08/22/17 12:53 PM  Result Value Ref Range Status   Specimen Description BLOOD RIGHT ARM  Final   Special Requests   Final    BOTTLES DRAWN AEROBIC AND ANAEROBIC Blood Culture results may not be optimal due to an excessive volume of blood received in culture bottles   Culture   Final    NO GROWTH 5 DAYS Performed at [facility], [address] [place], [place] [address]    Report Status 08/27/2017 FINAL  Final  MRSA PCR Screening     Status: None   Collection Time: 08/22/17  9:18 PM  Result Value Ref Range Status   MRSA by PCR NEGATIVE NEGATIVE Final    Comment:        The GeneXpert MRSA Assay (FDA approved for NASAL specimens only), is one component of a comprehensive MRSA colonization surveillance program. It is not intended to diagnose MRSA infection nor to guide or monitor treatment for MRSA infections. Performed at [facility], [address] [place], [place] [address]   Culture, blood (Routine X 2) w Reflex to ID Panel     Status:  None   Collection Time: 08/29/17  9:31 PM  Result Value Ref Range Status   Specimen Description BLOOD LEFT FOOT  Final   Special Requests   Final    BOTTLES DRAWN AEROBIC AND ANAEROBIC Blood Culture adequate volume   Culture   Final  NO GROWTH 5 DAYS Performed at [facility], [address] [place], [place] [address]    Report Status 09/03/2017 FINAL  Final  Culture, blood (Routine X 2) w Reflex to ID Panel     Status: None   Collection Time: 08/29/17  9:50 PM  Result Value Ref Range Status   Specimen Description BLOOD RIGHT ANKLE  Final   Special Requests   Final    BOTTLES DRAWN AEROBIC AND ANAEROBIC Blood Culture adequate volume   Culture   Final    NO GROWTH 5 DAYS Performed at [facility], [address] [place], [place] [address]    Report Status 09/03/2017 FINAL  Final  C difficile quick scan w PCR reflex     Status: None   Collection Time: 09/02/17 12:31 PM  Result Value Ref Range Status   C Diff antigen NEGATIVE NEGATIVE Final   C Diff toxin NEGATIVE NEGATIVE Final   C Diff interpretation No C. difficile detected.  Final    Comment: Performed at [facility], [address] [place], [place] [address]  CULTURE, BLOOD (ROUTINE X 2) w Reflex to ID Panel     Status: None   Collection Time: 09/02/17  7:25 PM  Result Value Ref Range Status   Specimen Description BLOOD BLOOD RIGHT HAND  Final   Special Requests   Final    BOTTLES DRAWN AEROBIC AND ANAEROBIC Blood Culture adequate volume   Culture   Final    NO GROWTH 5 DAYS Performed at [facility], [address] [place], [place] [address]    Report Status 09/07/2017 FINAL  Final  CULTURE, BLOOD (ROUTINE X 2) w Reflex to ID Panel     Status: None   Collection Time: 09/02/17  7:25 PM  Result Value Ref Range Status   Specimen Description BLOOD BLOOD RIGHT HAND  Final   Special Requests   Final    BOTTLES DRAWN AEROBIC AND ANAEROBIC Blood Culture results may not be optimal  due to an inadequate volume of blood received in culture bottles   Culture   Final    NO GROWTH 5 DAYS Performed at [facility], [address] [place], [place] [address]    Report Status 09/07/2017 FINAL  Final  Urine Culture     Status: Abnormal   Collection Time: 09/03/17  4:23 AM  Result Value Ref Range Status   Specimen Description   Final    URINE, RANDOM Performed at [facility], [address], [place], [place] [address]    Special Requests   Final    NONE Performed at [facility], [address] [place], [place] [address]    Culture (A)  Final    >=100,000 COLONIES/mL DIPHTHEROIDS(CORYNEBACTERIUM SPECIES) Standardized susceptibility testing for this organism is not available. Performed at [facility], [place] [address], [place], [place] [address]    Report Status 09/04/2017 FINAL  Final    Coagulation Studies: No results for input(s): LABPROT, INR in the last 72 hours.  Urinalysis: No results for input(s): COLORURINE, LABSPEC, PHURINE, GLUCOSEU, HGBUR, BILIRUBINUR, KETONESUR, PROTEINUR, UROBILINOGEN, NITRITE, LEUKOCYTESUR in the last 72 hours.  Invalid input(s): APPERANCEUR    Imaging: No results found.   Medications:    . amiodarone  200 mg Oral BID  . atorvastatin  80 mg Oral q1800  . chlorhexidine  15 mL Mouth Rinse BID  . Chlorhexidine Gluconate Cloth  6 each Topical Q0600  . cholecalciferol  5,000 Units Oral Daily  . epoetin (EPOGEN/PROCRIT) injection  10,000 Units Intravenous Q T,Th,Sa-HD  . feeding supplement (NEPRO CARB STEADY)  237 mL Oral BID BM  . insulin aspart  0-5 Units Subcutaneous QHS  . insulin aspart  0-9 Units Subcutaneous TID WC  . insulin glargine  10 Units Subcutaneous Daily  . letrozole  2.5 mg Oral Daily  . mouth rinse  15 mL Mouth Rinse q12n4p  . Melatonin  10 mg Oral QHS  . metoprolol succinate  50 mg Oral Daily  . multivitamin  1 tablet Oral QHS  . nystatin cream   Topical BID  .  pantoprazole  40 mg Oral QAC breakfast  . sodium chloride flush  10-40 mL Intracatheter Q12H   acetaminophen **OR** acetaminophen, bisacodyl, ipratropium-albuterol, metoprolol tartrate, [DISCONTINUED] ondansetron **OR** ondansetron (ZOFRAN) IV, sodium chloride flush  Assessment/ Plan:  [name] is a 72 y.o. white female with diabetes mellitus type II, coronary artery disease, obstructive sleep apnea, history of breast cancer, was admitted on6/9/2019with fever, chills, tick bite.  1. Acute renal failure on chronic kidney disease stage III Baseline creatinine of 1.1, GFR of 49 on 06/01/17  Requiring hemodialysis. Last hemodialysis treatment was 6/27.  Nonoliguric urine output.  Monitor daily for dialysis need. Daily labs.   2. Sepsis with Urinary tract infection and recent tick bite (concern for tick borne illness). Urine culture with corynebacterium species. Completed course of antibiotics.   3. Anemia with renal failure: now with peptic ulcer disease/GIB and acute blood loss. Status post endoscopy with clipping of gastric vessels by Dr. Allen Norris on 6/25  Status post 2 units PRBC transfusion 6/25.  - EPO with HD treatments.    LOS: [place] 6/29/20199:35 AM

## 2017-09-11 NOTE — Clinical Social Work Note (Addendum)
The CSW met with the patient and her husband at bedside to discuss discharge planning. The patient and her husband have chosen [facility], and they had general questions about copay and transport pay to and from dialysis if necessary. The CSW has begun the insurance auth request with [employer] in the case the patient discharges Monday or Tuesday. The CSW has alerted the patient and her husband to contact the insurance company on Monday to inquire about copay as the weekend representative does not have access to that information. CSW has updated the choice in the HUB and will continue to follow.  UPDATE: The patient will discharge Monday or Tuesday once she has a dialysis location. The [employer] authorization for SNF is [phone]. CSW is following.  Santiago Bumpers, MSW, Latanya Presser [phone]

## 2017-09-12 LAB — RENAL FUNCTION PANEL
ALBUMIN: 2.6 g/dL — AB (ref 3.5–5.0)
Anion gap: 10 (ref 5–15)
BUN: 47 mg/dL — ABNORMAL HIGH (ref 8–23)
CHLORIDE: 102 mmol/L (ref 98–111)
CO2: 27 mmol/L (ref 22–32)
Calcium: 8.8 mg/dL — ABNORMAL LOW (ref 8.9–10.3)
Creatinine, Ser: 5.07 mg/dL — ABNORMAL HIGH (ref 0.44–1.00)
GFR calc Af Amer: 9 mL/min — ABNORMAL LOW (ref >60)
GFR calc non Af Amer: 8 mL/min — ABNORMAL LOW (ref >60)
GLUCOSE: 224 mg/dL — AB (ref 70–99)
PHOSPHORUS: 3.4 mg/dL (ref 2.5–4.6)
POTASSIUM: 3.5 mmol/L (ref 3.5–5.1)
Sodium: 139 mmol/L (ref 135–145)

## 2017-09-12 LAB — GLUCOSE, CAPILLARY
GLUCOSE-CAPILLARY: 210 mg/dL — AB (ref 70–99)
GLUCOSE-CAPILLARY: 211 mg/dL — AB (ref 70–99)
GLUCOSE-CAPILLARY: 220 mg/dL — AB (ref 70–99)
Glucose-Capillary: 177 mg/dL — ABNORMAL HIGH (ref 70–99)

## 2017-09-12 NOTE — Progress Notes (Signed)
Patient weaned to room air. O2 sats 93% at rest. Report given to oncoming RN. Patient educated to call if feels short of breath. Will continue to monitor.

## 2017-09-12 NOTE — Progress Notes (Signed)
Patient ID: Bonnie Holt, female   DOB: 06/27/1945, 72 y.o.   MRN: [id number]    [facility] PROGRESS NOTE  ABBEGAIL MATUSKA TMH:[id number] DOB: March 08, 1946 DOA: 08/22/2017 PCP: Kirk Ruths, MD  HPI/Subjective: Sitting at the side of the bed currently denies any chest pain or shortness of breath   Objective: Vitals:   09/12/17 0316 09/12/17 0833  BP: (!) 146/98 (!) 154/48  Pulse: 69 70  Resp: 18 16  Temp: 98.3 F (36.8 C) 98.4 F (36.9 C)  SpO2: 100% 98%    Filed Weights   09/10/17 0316 09/11/17 0323 09/12/17 0316  Weight: 106.5 kg (234 lb 12.8 oz) 107.8 kg (237 lb 9.6 oz) 108.3 kg (238 lb 11.2 oz)    ROS: Review of Systems  Constitutional: Negative for chills and fever.  Eyes: Negative for blurred vision.  Respiratory: Negative for cough and shortness of breath.   Cardiovascular: Negative for chest pain.  Gastrointestinal: Positive for diarrhea. Negative for abdominal pain, constipation, nausea and vomiting.  Genitourinary: Negative for dysuria.  Musculoskeletal: Negative for joint pain.  Neurological: Negative for dizziness and headaches.   Exam: Physical Exam  HENT:  Nose: No mucosal edema.  Mouth/Throat: No oropharyngeal exudate or posterior oropharyngeal edema.  Eyes: Pupils are equal, round, and reactive to light. Conjunctivae, EOM and lids are normal.  Neck: No JVD present. Carotid bruit is not present. No edema present. No thyroid mass and no thyromegaly present.  Cardiovascular: S1 normal and S2 normal. Exam reveals no gallop.  No murmur heard. Pulses:      Dorsalis pedis pulses are 2+ on the right side, and 2+ on the left side.  Respiratory: No respiratory distress. She has decreased breath sounds in the right lower field and the left lower field. She has no wheezes. She has no rhonchi. She has no rales.  GI: Soft. Bowel sounds are normal. There is no tenderness.  Musculoskeletal:       Right ankle: She exhibits swelling.       Left ankle: She  exhibits swelling.  Lymphadenopathy:    She has no cervical adenopathy.  Neurological: She is alert. No cranial nerve deficit.  Able to straight leg raise bilaterally.  Skin: Skin is warm. No rash noted. Nails show no clubbing.  Psychiatric: She has a normal mood and affect.      Data Reviewed: Basic Metabolic Panel: Recent Labs  Lab 09/08/17 0353 09/09/17 0908 09/10/17 0828 09/11/17 0459 09/12/17 1140  NA 140 139 139 137 139  K 3.5 3.3* 3.5 3.3* 3.5  CL 101 102 102 101 102  CO2 28 26 27 27 27   GLUCOSE 104* 143* 128* 147* 224*  BUN 41* 48* 26* 32* 47*  CREATININE 5.41* 6.40* 4.07* 4.91* 5.07*  CALCIUM 8.2* 8.6* 8.6* 8.5* 8.8*  PHOS 7.3* 6.9* 3.5 3.8 3.4   Liver Function Tests: Recent Labs  Lab 09/08/17 0353 09/09/17 0908 09/10/17 0828 09/11/17 0459 09/12/17 1140  ALBUMIN 2.5* 2.4* 2.5* 2.4* 2.6*   CBC: Recent Labs  Lab 09/06/17 0431  09/06/17 1525 09/07/17 0424 09/07/17 1940 09/08/17 0353 09/09/17 1136  WBC 9.2  --   --  5.8  --  6.5 5.8  NEUTROABS  --   --   --  4.3  --  4.6  --   HGB 8.2*   < > 7.7* 5.9* 9.9* 10.1* 9.6*  HCT 24.4*   < > 23.3* 17.8* 28.7* 29.1* 28.5*  MCV 93.4  --   --  93.7  --  91.2 92.3  PLT 150  --   --  125*  --  154 163   < > = values in this interval not displayed.   Is CBG: Recent Labs  Lab 09/11/17 1156 09/11/17 1647 09/11/17 2039 09/12/17 0739 09/12/17 1144  GLUCAP 275* 162* 184* 177* 210*    Recent Results (from the past 240 hour(s))  CULTURE, BLOOD (ROUTINE X 2) w Reflex to ID Panel     Status: None   Collection Time: 09/02/17  7:25 PM  Result Value Ref Range Status   Specimen Description BLOOD BLOOD RIGHT HAND  Final   Special Requests   Final    BOTTLES DRAWN AEROBIC AND ANAEROBIC Blood Culture adequate volume   Culture   Final    NO GROWTH 5 DAYS Performed at [facility], [address] [place], [place] [address]    Report Status 09/07/2017 FINAL  Final  CULTURE, BLOOD (ROUTINE X 2) w Reflex  to ID Panel     Status: None   Collection Time: 09/02/17  7:25 PM  Result Value Ref Range Status   Specimen Description BLOOD BLOOD RIGHT HAND  Final   Special Requests   Final    BOTTLES DRAWN AEROBIC AND ANAEROBIC Blood Culture results may not be optimal due to an inadequate volume of blood received in culture bottles   Culture   Final    NO GROWTH 5 DAYS Performed at [facility], [address], [place], [place] [address]    Report Status 09/07/2017 FINAL  Final  Urine Culture     Status: Abnormal   Collection Time: 09/03/17  4:23 AM  Result Value Ref Range Status   Specimen Description   Final    URINE, RANDOM Performed at [facility], [address], [place], [place] [address]    Special Requests   Final    NONE Performed at [facility], [address], [place], [place] [address]    Culture (A)  Final    >=100,000 COLONIES/mL DIPHTHEROIDS(CORYNEBACTERIUM SPECIES) Standardized susceptibility testing for this organism is not available. Performed at [facility], [place] [address], [place], [place] [address]    Report Status 09/04/2017 FINAL  Final      Scheduled Meds: . amiodarone  200 mg Oral BID  . atorvastatin  80 mg Oral q1800  . chlorhexidine  15 mL Mouth Rinse BID  . Chlorhexidine Gluconate Cloth  6 each Topical Q0600  . cholecalciferol  5,000 Units Oral Daily  . feeding supplement (NEPRO CARB STEADY)  237 mL Oral BID BM  . insulin aspart  0-5 Units Subcutaneous QHS  . insulin aspart  0-9 Units Subcutaneous TID WC  . insulin glargine  10 Units Subcutaneous Daily  . letrozole  2.5 mg Oral Daily  . mouth rinse  15 mL Mouth Rinse q12n4p  . Melatonin  10 mg Oral QHS  . metoprolol succinate  50 mg Oral Daily  . multivitamin  1 tablet Oral QHS  . nystatin cream   Topical BID  . pantoprazole  40 mg Oral QAC breakfast  . sodium chloride flush  10-40 mL Intracatheter Q12H   Continuous Infusions:   Assessment/Plan:  1. Sepsis  with multiorgan failure and initial hypotension.  Likely combination of urinary tract infection versus tickborne disease.  Finished antibiotics. 2. Acute hypoxic respiratory failure with fluid overload.  Patient extubated on last Thursday.  Tapered down on nasal cannula 2 L.  Continue  incentive spirometry 3. Acute kidney injury.   permacath placed creatinine trending up nephrology following PRN hemodialysis 4. Acute blood loss anemia with upper GI bleed.  Endoscopy showing irritation from NG tube.  NG tube removed.  Patient on po Protonix.  Hemoglobin responded well to transfusion.   5. Diarrhea.  Rectal tube is out for 2 days.  Have improved 6. Elevated troponin demand ischemia in the setting of sepsis. 7. Atrial fibrillation with rapid ventricular response.  On amiodarone oral tablets.  Currently in normal sinus rhythm.  No blood thinners with recent GI bleed.  8. Thrombocytopenia secondary to sepsis.  last platelet count 163.  9. History of breast cancer on Femara 10. Morbid obesity weight loss needed 11. History of sleep apnea on BiPAP at home  12. type 2 diabetes on sliding scale 13. Weakness.  Physical therapy needed daily to get her stronger.  Patient will need rehab 14. Essential hypertension continue Toprol  Code Status:     Code Status Orders  (From admission, onward)        Start     Ordered   08/22/17 1612  Full code  Continuous     08/22/17 1611    Code Status History    This patient has a current code status but no historical code status.     Family Communication: Husband at the bedside Disposition Plan: Likely will need rehab  Consultants:  Nephrology  Time spent: 27 minutes.  Case discussed with nephrology.  Elmyra Banwart [employer]

## 2017-09-12 NOTE — Progress Notes (Signed)
[facility]  ROUNDING NOTE   Subjective:   Husband at bedside.   UOP 1725  Objective:  Vital signs in last 24 hours:  Temp:  [98.3 F (36.8 C)-99.4 F (37.4 C)] 98.4 F (36.9 C) (06/30 0833) Pulse Rate:  [69-78] 70 (06/30 0833) Resp:  [14-18] 16 (06/30 0833) BP: (146-154)/(48-98) 154/48 (06/30 0833) SpO2:  [98 %-100 %] 98 % (06/30 0833) Weight:  [108.3 kg (238 lb 11.2 oz)] 108.3 kg (238 lb 11.2 oz) (06/30 0316)  Weight change: 0.499 kg (1 lb 1.6 oz) Filed Weights   09/10/17 0316 09/11/17 0323 09/12/17 0316  Weight: 106.5 kg (234 lb 12.8 oz) 107.8 kg (237 lb 9.6 oz) 108.3 kg (238 lb 11.2 oz)    Intake/Output: I/O last 3 completed shifts: In: 009 [P.O.:357; I.V.:10] Out: 2525 [Urine:2525]   Intake/Output this shift:  No intake/output data recorded.  Physical Exam: General: Laying in bed  Head: [place]/AT  Eyes: Anicteric, PERRL  Neck: Supple, trachea midline  Lungs:  clear  Heart: Irregular rhythm  Abdomen:  Obese, nontender  Extremities: 1+ peripheral edema.  Neurologic: Nonfocal, moving all four extremities  Skin: No lesions  Access: RIJ permcath    Basic Metabolic Panel: Recent Labs  Lab 09/07/17 0424 09/08/17 0353 09/09/17 0908 09/10/17 0828 09/11/17 0459  NA 140 140 139 139 137  K 3.3* 3.5 3.3* 3.5 3.3*  CL 100 101 102 102 101  CO2 28 28 26 27 27   GLUCOSE 123* 104* 143* 128* 147*  BUN 34* 41* 48* 26* 32*  CREATININE 4.37* 5.41* 6.40* 4.07* 4.91*  CALCIUM 8.2* 8.2* 8.6* 8.6* 8.5*  PHOS 5.9* 7.3* 6.9* 3.5 3.8    Liver Function Tests: Recent Labs  Lab 09/07/17 0424 09/08/17 0353 09/09/17 0908 09/10/17 0828 09/11/17 0459  ALBUMIN 2.3* 2.5* 2.4* 2.5* 2.4*   No results for input(s): LIPASE, AMYLASE in the last 168 hours. No results for input(s): AMMONIA in the last 168 hours.  CBC: Recent Labs  Lab 09/06/17 0431  09/06/17 1525 09/07/17 0424 09/07/17 1940 09/08/17 0353 09/09/17 1136  WBC 9.2  --   --  5.8  --  6.5 5.8   NEUTROABS  --   --   --  4.3  --  4.6  --   HGB 8.2*   < > 7.7* 5.9* 9.9* 10.1* 9.6*  HCT 24.4*   < > 23.3* 17.8* 28.7* 29.1* 28.5*  MCV 93.4  --   --  93.7  --  91.2 92.3  PLT 150  --   --  125*  --  154 163   < > = values in this interval not displayed.    Cardiac Enzymes: No results for input(s): CKTOTAL, CKMB, CKMBINDEX, TROPONINI in the last 168 hours.  BNP: Invalid input(s): POCBNP  CBG: Recent Labs  Lab 09/11/17 0759 09/11/17 1156 09/11/17 1647 09/11/17 2039 09/12/17 0739  GLUCAP 152* 275* 162* 184* 177*    Microbiology: Results for orders placed or performed during the hospital encounter of 08/22/17  Blood Culture (routine x 2)     Status: None   Collection Time: 08/22/17 12:52 PM  Result Value Ref Range Status   Specimen Description BLOOD RIGHT HAND  Final   Special Requests   Final    BOTTLES DRAWN AEROBIC AND ANAEROBIC Blood Culture adequate volume   Culture   Final    NO GROWTH 5 DAYS Performed at [facility], [address] [place], [place] [address]    Report Status  08/27/2017 FINAL  Final  Urine culture     Status: Abnormal   Collection Time: 08/22/17 12:52 PM  Result Value Ref Range Status   Specimen Description   Final    URINE, RANDOM Performed at [facility], [address] [place], [place] [address]    Special Requests   Final    NONE Performed at [facility], [address] [place], [place] [address]    Culture (A)  Final    >=100,000 COLONIES/mL KLEBSIELLA PNEUMONIAE 60,000 COLONIES/mL PROTEUS MIRABILIS    Report Status 08/24/2017 FINAL  Final   Organism ID, Bacteria KLEBSIELLA PNEUMONIAE (A)  Final   Organism ID, Bacteria PROTEUS MIRABILIS (A)  Final      Susceptibility   Klebsiella pneumoniae - MIC*    AMPICILLIN RESISTANT Resistant     CEFAZOLIN <=4 SENSITIVE Sensitive     CEFTRIAXONE <=1 SENSITIVE Sensitive     CIPROFLOXACIN <=0.25 SENSITIVE Sensitive     GENTAMICIN <=1 SENSITIVE Sensitive      IMIPENEM <=0.25 SENSITIVE Sensitive     NITROFURANTOIN 64 INTERMEDIATE Intermediate     TRIMETH/SULFA <=20 SENSITIVE Sensitive     AMPICILLIN/SULBACTAM 4 SENSITIVE Sensitive     PIP/TAZO <=4 SENSITIVE Sensitive     Extended ESBL NEGATIVE Sensitive     * >=100,000 COLONIES/mL KLEBSIELLA PNEUMONIAE   Proteus mirabilis - MIC*    AMPICILLIN <=2 SENSITIVE Sensitive     CEFAZOLIN <=4 SENSITIVE Sensitive     CEFTRIAXONE <=1 SENSITIVE Sensitive     CIPROFLOXACIN <=0.25 SENSITIVE Sensitive     GENTAMICIN <=1 SENSITIVE Sensitive     IMIPENEM 1 SENSITIVE Sensitive     NITROFURANTOIN 128 RESISTANT Resistant     TRIMETH/SULFA <=20 SENSITIVE Sensitive     AMPICILLIN/SULBACTAM <=2 SENSITIVE Sensitive     PIP/TAZO <=4 SENSITIVE Sensitive     * 60,000 COLONIES/mL PROTEUS MIRABILIS  Blood Culture (routine x 2)     Status: None   Collection Time: 08/22/17 12:53 PM  Result Value Ref Range Status   Specimen Description BLOOD RIGHT ARM  Final   Special Requests   Final    BOTTLES DRAWN AEROBIC AND ANAEROBIC Blood Culture results may not be optimal due to an excessive volume of blood received in culture bottles   Culture   Final    NO GROWTH 5 DAYS Performed at [facility], [address] [place], [place] [address]    Report Status 08/27/2017 FINAL  Final  MRSA PCR Screening     Status: None   Collection Time: 08/22/17  9:18 PM  Result Value Ref Range Status   MRSA by PCR NEGATIVE NEGATIVE Final    Comment:        The GeneXpert MRSA Assay (FDA approved for NASAL specimens only), is one component of a comprehensive MRSA colonization surveillance program. It is not intended to diagnose MRSA infection nor to guide or monitor treatment for MRSA infections. Performed at [facility], [address] [place],  [address]   Culture, blood (Routine X 2) w Reflex to ID Panel     Status: None   Collection Time: 08/29/17  9:31 PM  Result Value Ref Range Status    Specimen Description BLOOD LEFT FOOT  Final   Special Requests   Final    BOTTLES DRAWN AEROBIC AND ANAEROBIC Blood Culture adequate volume   Culture   Final    NO GROWTH 5 DAYS Performed at [facility], [place],  [place] [address]    Report Status 09/03/2017 FINAL  Final  Culture, blood (Routine X 2) w Reflex to ID Panel     Status: None   Collection Time: 08/29/17  9:50 PM  Result Value Ref Range Status   Specimen Description BLOOD RIGHT ANKLE  Final   Special Requests   Final    BOTTLES DRAWN AEROBIC AND ANAEROBIC Blood Culture adequate volume   Culture   Final    NO GROWTH 5 DAYS Performed at [facility], [address], [place], [place] [address]    Report Status 09/03/2017 FINAL  Final  C difficile quick scan w PCR reflex     Status: None   Collection Time: 09/02/17 12:31 PM  Result Value Ref Range Status   C Diff antigen NEGATIVE NEGATIVE Final   C Diff toxin NEGATIVE NEGATIVE Final   C Diff interpretation No C. difficile detected.  Final    Comment: Performed at [facility], [address] [place], [place] [address]  CULTURE, BLOOD (ROUTINE X 2) w Reflex to ID Panel     Status: None   Collection Time: 09/02/17  7:25 PM  Result Value Ref Range Status   Specimen Description BLOOD BLOOD RIGHT HAND  Final   Special Requests   Final    BOTTLES DRAWN AEROBIC AND ANAEROBIC Blood Culture adequate volume   Culture   Final    NO GROWTH 5 DAYS Performed at [facility], [address] [place], [place] [address]    Report Status 09/07/2017 FINAL  Final  CULTURE, BLOOD (ROUTINE X 2) w Reflex to ID Panel     Status: None   Collection Time: 09/02/17  7:25 PM  Result Value Ref Range Status   Specimen Description BLOOD BLOOD RIGHT HAND  Final   Special Requests   Final    BOTTLES DRAWN AEROBIC AND ANAEROBIC Blood Culture results may not be optimal due to an inadequate volume of blood received in culture bottles   Culture    Final    NO GROWTH 5 DAYS Performed at [facility], [address] [place], [place] [address]    Report Status 09/07/2017 FINAL  Final  Urine Culture     Status: Abnormal   Collection Time: 09/03/17  4:23 AM  Result Value Ref Range Status   Specimen Description   Final    URINE, RANDOM Performed at [facility], [address] [place], [place] [address]    Special Requests   Final    NONE Performed at [facility], [address], [place], [place] [address]    Culture (A)  Final    >=100,000 COLONIES/mL DIPHTHEROIDS(CORYNEBACTERIUM SPECIES) Standardized susceptibility testing for this organism is not available. Performed at [facility], [place] [address], [place], [place] [address]    Report Status 09/04/2017 FINAL  Final    Coagulation Studies: No results for input(s): LABPROT, INR in the last 72 hours.  Urinalysis: No results for input(s): COLORURINE, LABSPEC, PHURINE, GLUCOSEU, HGBUR, BILIRUBINUR, KETONESUR, PROTEINUR, UROBILINOGEN, NITRITE, LEUKOCYTESUR in the last 72 hours.  Invalid input(s): APPERANCEUR    Imaging: No results found.   Medications:    . amiodarone  200 mg Oral BID  . atorvastatin  80 mg Oral q1800  . chlorhexidine  15 mL Mouth Rinse BID  . Chlorhexidine Gluconate Cloth  6 each Topical Q0600  . cholecalciferol  5,000 Units Oral Daily  . feeding supplement (NEPRO CARB STEADY)  237 mL  Oral BID BM  . insulin aspart  0-5 Units Subcutaneous QHS  . insulin aspart  0-9 Units Subcutaneous TID WC  . insulin glargine  10 Units Subcutaneous Daily  . letrozole  2.5 mg Oral Daily  . mouth rinse  15 mL Mouth Rinse q12n4p  . Melatonin  10 mg Oral QHS  . metoprolol succinate  50 mg Oral Daily  . multivitamin  1 tablet Oral QHS  . nystatin cream   Topical BID  . pantoprazole  40 mg Oral QAC breakfast  . sodium chloride flush  10-40 mL Intracatheter Q12H   acetaminophen **OR** acetaminophen, bisacodyl,  ipratropium-albuterol, metoprolol tartrate, [DISCONTINUED] ondansetron **OR** ondansetron (ZOFRAN) IV, sodium chloride flush  Assessment/ Plan:  Bonnie Holt is a 72 y.o. white female with diabetes mellitus type II, coronary artery disease, obstructive sleep apnea, history of breast cancer, was admitted on6/9/2019with fever, chills, tick bite.  Brief hospital course: Admitted on 08/22/2017 for sepsis and urinary tract infection. There was concern for tick borne illness. Urine grew corynebacterium species. Patient required hemodialysis on 6/16 first treatment. Last hemodialysis treatment was 6/27. Permcath placed on 6/27. Since then has not required hemodialysis.   1. Acute renal failure on chronic kidney disease stage III Baseline creatinine of 1.1, GFR of 49 on 06/01/17  Requiring hemodialysis. Last hemodialysis treatment was 6/27.  Nonoliguric urine output.  Monitor daily for dialysis need. Daily labs. No indication for dialysis today.   2. Sepsis with Urinary tract infection and recent tick bite (concern for tick borne illness). Urine culture with corynebacterium species. Completed course of antibiotics.   3. Anemia with renal failure: now with peptic ulcer disease/GIB and acute blood loss. Status post endoscopy with clipping of gastric vessels by Dr. Allen Norris on 6/25  Status post 2 units PRBC transfusion 6/25.  - EPO with HD treatments.    LOS: 21 Ly Bacchi 6/30/201911:22 AM

## 2017-09-12 NOTE — Progress Notes (Signed)
Patient has refused cpap for the night. States its too much pressure and its too much noise. She states she does use one at home and wears it but its a lot different than hospital unit

## 2017-09-13 ENCOUNTER — Encounter: Payer: Self-pay | Admitting: Vascular Surgery

## 2017-09-13 LAB — EHRLICHIA ANTIBODY PANEL
E CHAFFEENSIS AB, IGM: NEGATIVE
E. Chaffeensis (HME) IgM Titer: NEGATIVE

## 2017-09-13 LAB — CBC
HEMATOCRIT: 27.5 % — AB (ref 35.0–47.0)
HEMOGLOBIN: 9.3 g/dL — AB (ref 12.0–16.0)
MCH: 30.8 pg (ref 26.0–34.0)
MCHC: 33.7 g/dL (ref 32.0–36.0)
MCV: 91.3 fL (ref 80.0–100.0)
Platelets: 180 10*3/uL (ref 150–440)
RBC: 3.01 MIL/uL — ABNORMAL LOW (ref 3.80–5.20)
RDW: 15.7 % — ABNORMAL HIGH (ref 11.5–14.5)
WBC: 7.4 10*3/uL (ref 3.6–11.0)

## 2017-09-13 LAB — RENAL FUNCTION PANEL
ANION GAP: 9 (ref 5–15)
Albumin: 2.5 g/dL — ABNORMAL LOW (ref 3.5–5.0)
BUN: 54 mg/dL — ABNORMAL HIGH (ref 8–23)
CHLORIDE: 104 mmol/L (ref 98–111)
CO2: 28 mmol/L (ref 22–32)
Calcium: 8.8 mg/dL — ABNORMAL LOW (ref 8.9–10.3)
Creatinine, Ser: 5.38 mg/dL — ABNORMAL HIGH (ref 0.44–1.00)
GFR calc Af Amer: 8 mL/min — ABNORMAL LOW (ref >60)
GFR calc non Af Amer: 7 mL/min — ABNORMAL LOW (ref >60)
GLUCOSE: 187 mg/dL — AB (ref 70–99)
POTASSIUM: 3.3 mmol/L — AB (ref 3.5–5.1)
Phosphorus: 3.8 mg/dL (ref 2.5–4.6)
Sodium: 141 mmol/L (ref 135–145)

## 2017-09-13 LAB — GLUCOSE, CAPILLARY
GLUCOSE-CAPILLARY: 237 mg/dL — AB (ref 70–99)
Glucose-Capillary: 163 mg/dL — ABNORMAL HIGH (ref 70–99)
Glucose-Capillary: 239 mg/dL — ABNORMAL HIGH (ref 70–99)
Glucose-Capillary: 221 mg/dL — ABNORMAL HIGH (ref 70–99)

## 2017-09-13 MED ORDER — OXYCODONE HCL 5 MG PO TABS: 5.0000 mg | ORAL_TABLET | Freq: Four times a day (QID) | ORAL | Status: DC | PRN

## 2017-09-13 MED ORDER — NICOTINE 14 MG/24HR TD PT24: 14.0000 mg | MEDICATED_PATCH | Freq: Every day | TRANSDERMAL | Status: DC

## 2017-09-13 NOTE — Progress Notes (Signed)
[facility]  ROUNDING NOTE   Subjective:   Husband at bedside.  Brisk UOP noted Patient is able to eat and drink without nausea or vomiting Continues to have large amount of edema  Objective:  Vital signs in last 24 hours:  Temp:  [98.3 F (36.8 C)-99.1 F (37.3 C)] 98.3 F (36.8 C) (07/01 0537) Pulse Rate:  [61-78] 61 (07/01 1000) Resp:  [15-20] 18 (07/01 1000) BP: (148-166)/(59-99) 156/59 (07/01 1000) SpO2:  [96 %-100 %] 100 % (07/01 1000)  Weight change:  Filed Weights   09/10/17 0316 09/11/17 0323 09/12/17 0316  Weight: 106.5 kg (234 lb 12.8 oz) 107.8 kg (237 lb 9.6 oz) 108.3 kg (238 lb 11.2 oz)    Intake/Output: I/O last 3 completed shifts: In: -  Out: 2250 [Urine:2250]   Intake/Output this shift:  Total I/O In: 120 [P.O.:120] Out: 900 [Urine:900]  Physical Exam: General: Laying in bed  Head: [place]/AT  Eyes: Anicteric, moist oral mucus membranes  Neck: Supple,   Lungs:  clear  Heart: Irregular rhythm  Abdomen:  Obese, nontender  Extremities: 2+ peripheral edema.  Neurologic: Nonfocal, moving all four extremities  Skin: Pink rash over lower abdomen and legs b/l  Access: RIJ permcath    Basic Metabolic Panel: Recent Labs  Lab 09/09/17 0908 09/10/17 0828 09/11/17 0459 09/12/17 1140 09/13/17 0557  NA 139 139 137 139 141  K 3.3* 3.5 3.3* 3.5 3.3*  CL 102 102 101 102 104  CO2 26 27 27 27 28   GLUCOSE 143* 128* 147* 224* 187*  BUN 48* 26* 32* 47* 54*  CREATININE 6.40* 4.07* 4.91* 5.07* 5.38*  CALCIUM 8.6* 8.6* 8.5* 8.8* 8.8*  PHOS 6.9* 3.5 3.8 3.4 3.8    Liver Function Tests: Recent Labs  Lab 09/09/17 0908 09/10/17 0828 09/11/17 0459 09/12/17 1140 09/13/17 0557  ALBUMIN 2.4* 2.5* 2.4* 2.6* 2.5*   No results for input(s): LIPASE, AMYLASE in the last 168 hours. No results for input(s): AMMONIA in the last 168 hours.  CBC: Recent Labs  Lab 09/07/17 0424 09/07/17 1940 09/08/17 0353 09/09/17 1136 09/13/17 0557  WBC 5.8  --   6.5 5.8 7.4  NEUTROABS 4.3  --  4.6  --   --   HGB 5.9* 9.9* 10.1* 9.6* 9.3*  HCT 17.8* 28.7* 29.1* 28.5* 27.5*  MCV 93.7  --  91.2 92.3 91.3  PLT 125*  --  154 163 180    Cardiac Enzymes: No results for input(s): CKTOTAL, CKMB, CKMBINDEX, TROPONINI in the last 168 hours.  BNP: Invalid input(s): POCBNP  CBG: Recent Labs  Lab 09/12/17 1144 09/12/17 1637 09/12/17 2027 09/13/17 0735 09/13/17 1140  GLUCAP 210* 211* 220* 163* 237*    Microbiology: Results for orders placed or performed during the hospital encounter of 08/22/17  Blood Culture (routine x 2)     Status: None   Collection Time: 08/22/17 12:52 PM  Result Value Ref Range Status   Specimen Description BLOOD RIGHT HAND  Final   Special Requests   Final    BOTTLES DRAWN AEROBIC AND ANAEROBIC Blood Culture adequate volume   Culture   Final    NO GROWTH 5 DAYS Performed at [facility], [address] [place], [place] [address]    Report Status 08/27/2017 FINAL  Final  Urine culture     Status: Abnormal   Collection Time: 08/22/17 12:52 PM  Result Value Ref Range Status   Specimen Description   Final    URINE, RANDOM Performed at  [facility], [address], [place], [place] [address]    Special Requests   Final    NONE Performed at [facility], [address] [place], [place] [address]    Culture (A)  Final    >=100,000 COLONIES/mL KLEBSIELLA PNEUMONIAE 60,000 COLONIES/mL PROTEUS MIRABILIS    Report Status 08/24/2017 FINAL  Final   Organism ID, Bacteria KLEBSIELLA PNEUMONIAE (A)  Final   Organism ID, Bacteria PROTEUS MIRABILIS (A)  Final      Susceptibility   Klebsiella pneumoniae - MIC*    AMPICILLIN RESISTANT Resistant     CEFAZOLIN <=4 SENSITIVE Sensitive     CEFTRIAXONE <=1 SENSITIVE Sensitive     CIPROFLOXACIN <=0.25 SENSITIVE Sensitive     GENTAMICIN <=1 SENSITIVE Sensitive     IMIPENEM <=0.25 SENSITIVE Sensitive     NITROFURANTOIN 64 INTERMEDIATE Intermediate      TRIMETH/SULFA <=20 SENSITIVE Sensitive     AMPICILLIN/SULBACTAM 4 SENSITIVE Sensitive     PIP/TAZO <=4 SENSITIVE Sensitive     Extended ESBL NEGATIVE Sensitive     * >=100,000 COLONIES/mL KLEBSIELLA PNEUMONIAE   Proteus mirabilis - MIC*    AMPICILLIN <=2 SENSITIVE Sensitive     CEFAZOLIN <=4 SENSITIVE Sensitive     CEFTRIAXONE <=1 SENSITIVE Sensitive     CIPROFLOXACIN <=0.25 SENSITIVE Sensitive     GENTAMICIN <=1 SENSITIVE Sensitive     IMIPENEM 1 SENSITIVE Sensitive     NITROFURANTOIN 128 RESISTANT Resistant     TRIMETH/SULFA <=20 SENSITIVE Sensitive     AMPICILLIN/SULBACTAM <=2 SENSITIVE Sensitive     PIP/TAZO <=4 SENSITIVE Sensitive     * 60,000 COLONIES/mL PROTEUS MIRABILIS  Blood Culture (routine x 2)     Status: None   Collection Time: 08/22/17 12:53 PM  Result Value Ref Range Status   Specimen Description BLOOD RIGHT ARM  Final   Special Requests   Final    BOTTLES DRAWN AEROBIC AND ANAEROBIC Blood Culture results may not be optimal due to an excessive volume of blood received in culture bottles   Culture   Final    NO GROWTH 5 DAYS Performed at [facility], [address] [place], [place] [address]    Report Status 08/27/2017 FINAL  Final  MRSA PCR Screening     Status: None   Collection Time: 08/22/17  9:18 PM  Result Value Ref Range Status   MRSA by PCR NEGATIVE NEGATIVE Final    Comment:        The GeneXpert MRSA Assay (FDA approved for NASAL specimens only), is one component of a comprehensive MRSA colonization surveillance program. It is not intended to diagnose MRSA infection nor to guide or monitor treatment for MRSA infections. Performed at [facility], [address] [place], [place] [address]   Culture, blood (Routine X 2) w Reflex to ID Panel     Status: None   Collection Time: 08/29/17  9:31 PM  Result Value Ref Range Status   Specimen Description BLOOD LEFT FOOT  Final   Special Requests   Final    BOTTLES DRAWN AEROBIC  AND ANAEROBIC Blood Culture adequate volume   Culture   Final    NO GROWTH 5 DAYS Performed at [facility], [address], [place], [place] [address]    Report Status 09/03/2017 FINAL  Final  Culture, blood (Routine X 2) w Reflex to ID Panel     Status: None   Collection Time: 08/29/17  9:50 PM  Result Value Ref  Range Status   Specimen Description BLOOD RIGHT ANKLE  Final   Special Requests   Final    BOTTLES DRAWN AEROBIC AND ANAEROBIC Blood Culture adequate volume   Culture   Final    NO GROWTH 5 DAYS Performed at [facility], [address] [place], [place] [address]    Report Status 09/03/2017 FINAL  Final  C difficile quick scan w PCR reflex     Status: None   Collection Time: 09/02/17 12:31 PM  Result Value Ref Range Status   C Diff antigen NEGATIVE NEGATIVE Final   C Diff toxin NEGATIVE NEGATIVE Final   C Diff interpretation No C. difficile detected.  Final    Comment: Performed at [facility], [address] [place], [place] [address]  CULTURE, BLOOD (ROUTINE X 2) w Reflex to ID Panel     Status: None   Collection Time: 09/02/17  7:25 PM  Result Value Ref Range Status   Specimen Description BLOOD BLOOD RIGHT HAND  Final   Special Requests   Final    BOTTLES DRAWN AEROBIC AND ANAEROBIC Blood Culture adequate volume   Culture   Final    NO GROWTH 5 DAYS Performed at [facility], [address] [place], [place] [address]    Report Status 09/07/2017 FINAL  Final  CULTURE, BLOOD (ROUTINE X 2) w Reflex to ID Panel     Status: None   Collection Time: 09/02/17  7:25 PM  Result Value Ref Range Status   Specimen Description BLOOD BLOOD RIGHT HAND  Final   Special Requests   Final    BOTTLES DRAWN AEROBIC AND ANAEROBIC Blood Culture results may not be optimal due to an inadequate volume of blood received in culture bottles   Culture   Final    NO GROWTH 5 DAYS Performed at [facility], [address] [place],  [place] [address]    Report Status 09/07/2017 FINAL  Final  Urine Culture     Status: Abnormal   Collection Time: 09/03/17  4:23 AM  Result Value Ref Range Status   Specimen Description   Final    URINE, RANDOM Performed at [facility], [address] [place], [place] [address]    Special Requests   Final    NONE Performed at [facility], [address] [place], [place] [address]    Culture (A)  Final    >=100,000 COLONIES/mL DIPHTHEROIDS(CORYNEBACTERIUM SPECIES) Standardized susceptibility testing for this organism is not available. Performed at [facility], [place] [address], [place], [place] [address]    Report Status 09/04/2017 FINAL  Final    Coagulation Studies: No results for input(s): LABPROT, INR in the last 72 hours.  Urinalysis: No results for input(s): COLORURINE, LABSPEC, PHURINE, GLUCOSEU, HGBUR, BILIRUBINUR, KETONESUR, PROTEINUR, UROBILINOGEN, NITRITE, LEUKOCYTESUR in the last 72 hours.  Invalid input(s): APPERANCEUR    Imaging: No results found.   Medications:    . amiodarone  200 mg Oral BID  . atorvastatin  80 mg Oral q1800  . chlorhexidine  15 mL Mouth Rinse BID  . Chlorhexidine Gluconate Cloth  6 each Topical Q0600  . cholecalciferol  5,000 Units Oral Daily  . feeding supplement (NEPRO CARB STEADY)  237 mL Oral BID BM  . insulin aspart  0-5 Units Subcutaneous QHS  . insulin aspart  0-9 Units Subcutaneous TID WC  . insulin glargine  10 Units Subcutaneous Daily  . letrozole  2.5 mg Oral Daily  .  mouth rinse  15 mL Mouth Rinse q12n4p  . Melatonin  10 mg Oral QHS  . metoprolol succinate  50 mg Oral Daily  . multivitamin  1 tablet Oral QHS  . nystatin cream   Topical BID  . pantoprazole  40 mg Oral QAC breakfast  . sodium chloride flush  10-40 mL Intracatheter Q12H   acetaminophen **OR** acetaminophen, bisacodyl, ipratropium-albuterol, metoprolol tartrate, [DISCONTINUED] ondansetron **OR** ondansetron (ZOFRAN) IV, sodium  chloride flush  Assessment/ Plan:  Bonnie Holt is a 72 y.o. white female with diabetes mellitus type II, coronary artery disease, obstructive sleep apnea, history of breast cancer, was admitted on6/9/2019with fever, chills, tick bite (left groin and back)  Brief hospital course: Admitted on 08/22/2017 for sepsis and urinary tract infection. There was concern for tick borne illness. Urine grew corynebacterium species. Patient required hemodialysis on 6/16 first treatment. Last hemodialysis treatment was 6/27. Permcath placed on 6/27. Since then has not required hemodialysis.   1. Acute renal failure on chronic kidney disease stage III Baseline creatinine of 1.1, GFR of 49 on 06/01/17  UOP has  Improved. No uremic symptoms. Electrolytes and volume status are acceptable. No acute indication of HD at present - Consider removing foley catheter - BUN.Cr is expected to continue to improve    2. Sepsis with Urinary tract infection and recent tick bite (concern for tick borne illness). Urine culture with corynebacterium species. Completed course of antibiotics.   3. Anemia with renal failure: now with peptic ulcer disease/GIB and acute blood loss. Status post endoscopy with clipping of gastric vessels by Dr. Allen Norris on 6/25  Status post 2 units PRBC transfusion 6/25.   4. Generalized edema - good diuresis without diuretics - avoid hypotension  5. Hypokalemia - replace PRN   LOS: 22 Noemi Bellissimo 7/1/201912:56 PM

## 2017-09-13 NOTE — Plan of Care (Signed)
  Problem: Education: Goal: Knowledge of General Education information will improve Outcome: Progressing   Problem: Fluid Volume: Goal: Hemodynamic stability will improve Outcome: Progressing   Problem: Clinical Measurements: Goal: Diagnostic test results will improve Outcome: Progressing Goal: Signs and symptoms of infection will decrease Outcome: Progressing   Problem: Respiratory: Goal: Ability to maintain adequate ventilation will improve Outcome: Progressing

## 2017-09-13 NOTE — Progress Notes (Signed)
Patient was transferred to 112.  Patient decided she wanted to wear cpap.  Placed patient on auto cpap unit. She states her home unit is a auto cpap as well.  2 liters o2 added. Patient tolerated well.

## 2017-09-13 NOTE — Progress Notes (Signed)
Physical Therapy Treatment Patient Details Name: Bonnie Holt MRN: [id number] DOB: 1946-01-24 Today's Date: 09/13/2017    History of Present Illness Pt admitted to hospital on 08/22/17 and has been diagnosed with sepsis secondary to UTI, acute renal failure, and acute respiratory failure. Pts hospital stay has been complicated by fall on 0/25/85 intubation on 08/31/17 and extubation on 09/02/17. Pt has a past medical history that includes breast cancer, coronary artery disease, and HTN. Pt currently has tunneled R IJ catheter for HD.     PT Comments    Chart reviewed, PT planned treatment session this morning however per nursing pt has scheduled HD session today. Session held due to scheduled HD. PT plans to see pt on next date she is medically appropriate.  Follow Up Recommendations        Equipment Recommendations       Recommendations for Other Services       Precautions / Restrictions Precautions Precautions: Fall;Other (comment) Precaution Comments: R IJ HD permcath now Restrictions Weight Bearing Restrictions: No    Mobility  Bed Mobility                  Transfers                    Ambulation/Gait                 Stairs             Wheelchair Mobility    Modified Rankin (Stroke Patients Only)       Balance Overall balance assessment: Needs assistance   Sitting balance-Leahy Scale: Good                                      Cognition Arousal/Alertness: Awake/alert Behavior During Therapy: WFL for tasks assessed/performed Overall Cognitive Status: Within Functional Limits for tasks assessed                                        Exercises Other Exercises Other Exercises: Retrograde massage to R fingers, hand, and wrist to minimize edema. Pt reported her hand feeling "better" afterwards. Positioned on pillows to elevate. Instructed pt/spouse on positioning.    General Comments General  comments (skin integrity, edema, etc.): RUE wrist/hand continues to be edematous (+2 pitting), elevated on pillows to improve positioning      Pertinent Vitals/Pain Pain Assessment: No/denies pain    Home Living                      Prior Function            PT Goals (current goals can now be found in the care plan section) Acute Rehab PT Goals Patient Stated Goal: get better and go home    Frequency           PT Plan      Co-evaluation              AM-PAC PT "6 Clicks" Daily Activity  Outcome Measure                   End of Session               Time:  -     Charges:  G Codes:      Sharalee Witman [name], SPT    Stacyann Mcconaughy 09/13/2017, 11:52 AM

## 2017-09-13 NOTE — Care Management Important Message (Signed)
Important Message  Patient Details  Name: [name] MRN: [id number] Date of Birth: 1946-02-27   Medicare Important Message Given:  Yes    Juliann Pulse A Jayr Lupercio 09/13/2017, 11:40 AM

## 2017-09-13 NOTE — Progress Notes (Signed)
Patient ID: Bonnie Holt, female   DOB: [birth year], 72 y.o.   MRN: [id number]    [facility] PROGRESS NOTE  Bonnie Holt VQQ:[id number] DOB: March 15, 1946 DOA: 08/22/2017 PCP: Bonnie Ruths, MD  HPI/Subjective: Patient still very weak creatinine increasing urine output good though,    Objective: Vitals:   09/13/17 0537 09/13/17 1000  BP: (!) 152/68 (!) 156/59  Pulse: 68 61  Resp: 15 18  Temp: 98.3 F (36.8 C)   SpO2: 100% 100%    Filed Weights   09/10/17 0316 09/11/17 0323 09/12/17 0316  Weight: 106.5 kg (234 lb 12.8 oz) 107.8 kg (237 lb 9.6 oz) 108.3 kg (238 lb 11.2 oz)    ROS: Review of Systems  Constitutional: Negative for chills and fever.  Eyes: Negative for blurred vision.  Respiratory: Negative for cough and shortness of breath.   Cardiovascular: Negative for chest pain.  Gastrointestinal: Negative for abdominal pain, constipation, diarrhea, nausea and vomiting.  Genitourinary: Negative for dysuria.  Musculoskeletal: Negative for joint pain.  Neurological: Negative for dizziness and headaches.   Exam: Physical Exam  HENT:  Nose: No mucosal edema.  Mouth/Throat: No oropharyngeal exudate or posterior oropharyngeal edema.  Eyes: Pupils are equal, round, and reactive to light. Conjunctivae, EOM and lids are normal.  Neck: No JVD present. Carotid bruit is not present. No edema present. No thyroid mass and no thyromegaly present.  Cardiovascular: S1 normal and S2 normal. Exam reveals no gallop.  No murmur heard. Pulses:      Dorsalis pedis pulses are 2+ on the right side, and 2+ on the left side.  Respiratory: No respiratory distress. She has decreased breath sounds in the right lower field and the left lower field. She has no wheezes. She has no rhonchi. She has no rales.  GI: Soft. Bowel sounds are normal. There is no tenderness.  Musculoskeletal:       Right ankle: She exhibits swelling.       Left ankle: She exhibits swelling.  Lymphadenopathy:     She has no cervical adenopathy.  Neurological: She is alert. No cranial nerve deficit.  Able to straight leg raise bilaterally.  Skin: Skin is warm. No rash noted. Nails show no clubbing.  Psychiatric: She has a normal mood and affect.      Data Reviewed: Basic Metabolic Panel: Recent Labs  Lab 09/09/17 0908 09/10/17 0828 09/11/17 0459 09/12/17 1140 09/13/17 0557  NA 139 139 137 139 141  K 3.3* 3.5 3.3* 3.5 3.3*  CL 102 102 101 102 104  CO2 26 27 27 27 28   GLUCOSE 143* 128* 147* 224* 187*  BUN 48* 26* 32* 47* 54*  CREATININE 6.40* 4.07* 4.91* 5.07* 5.38*  CALCIUM 8.6* 8.6* 8.5* 8.8* 8.8*  PHOS 6.9* 3.5 3.8 3.4 3.8   Liver Function Tests: Recent Labs  Lab 09/09/17 0908 09/10/17 0828 09/11/17 0459 09/12/17 1140 09/13/17 0557  ALBUMIN 2.4* 2.5* 2.4* 2.6* 2.5*   CBC: Recent Labs  Lab 09/07/17 0424 09/07/17 1940 09/08/17 0353 09/09/17 1136 09/13/17 0557  WBC 5.8  --  6.5 5.8 7.4  NEUTROABS 4.3  --  4.6  --   --   HGB 5.9* 9.9* 10.1* 9.6* 9.3*  HCT 17.8* 28.7* 29.1* 28.5* 27.5*  MCV 93.7  --  91.2 92.3 91.3  PLT 125*  --  154 163 180   Is CBG: Recent Labs  Lab 09/12/17 1144 09/12/17 1637 09/12/17 2027 09/13/17 0735 09/13/17 1140  GLUCAP 210* 211* 220* 163* 237*  No results found for this or any previous visit (from the past 240 hour(s)).    Scheduled Meds: . amiodarone  200 mg Oral BID  . atorvastatin  80 mg Oral q1800  . chlorhexidine  15 mL Mouth Rinse BID  . Chlorhexidine Gluconate Cloth  6 each Topical Q0600  . cholecalciferol  5,000 Units Oral Daily  . feeding supplement (NEPRO CARB STEADY)  237 mL Oral BID BM  . insulin aspart  0-5 Units Subcutaneous QHS  . insulin aspart  0-9 Units Subcutaneous TID WC  . insulin glargine  10 Units Subcutaneous Daily  . letrozole  2.5 mg Oral Daily  . mouth rinse  15 mL Mouth Rinse q12n4p  . Melatonin  10 mg Oral QHS  . metoprolol succinate  50 mg Oral Daily  . multivitamin  1 tablet Oral QHS  .  nystatin cream   Topical BID  . pantoprazole  40 mg Oral QAC breakfast  . sodium chloride flush  10-40 mL Intracatheter Q12H   Continuous Infusions:   Assessment/Plan:  1. Sepsis with multiorgan failure and initial hypotension.  Likely combination of urinary tract infection versus tickborne disease.  Finished antibiotics. 2. Acute hypoxic respiratory failure with fluid overload.  Patient extubated on last Thursday.  Tapered down on nasal cannula 2 L.  Continue incentive spirometry 3. Acute kidney injury.   permacath placed creatinine trending up nephrology following PRN hemodialysis, I discussed with nephrology 4. Acute blood loss anemia with upper GI bleed.  Endoscopy showing irritation from NG tube.  NG tube removed.  Patient on po Protonix.  Hemoglobin responded well to transfusion.   5. Diarrhea.  Rectal tube is out for 2 days.  Have improved 6. Elevated troponin demand ischemia in the setting of sepsis. 7. Atrial fibrillation with rapid ventricular response.  On amiodarone oral tablets.  Currently in normal sinus rhythm.  No blood thinners with recent GI bleed.  8. Thrombocytopenia secondary to sepsis.  Platelet count stable  9. history of breast cancer on Femara 10. Morbid obesity weight loss needed 11. History of sleep apnea on BiPAP at home  12. type 2 diabetes on sliding scale 13. Weakness.  Physical therapy needed daily to get her stronger.  Patient will need rehab 14. Essential hypertension continue Toprol  Code Status:     Code Status Orders  (From admission, onward)        Start     Ordered   08/22/17 1612  Full code  Continuous     08/22/17 1611    Code Status History    This patient has a current code status but no historical code status.     Family Communication: Husband at the bedside Disposition Plan: Likely will need rehab  Consultants:  Nephrology  Time spent: 27 minutes.  Case discussed with nephrology.  Bonnie Holt [facility]

## 2017-09-13 NOTE — Progress Notes (Signed)
Pt transferred to 112. No concerns offered from patient. No SOB, no c/o pain.

## 2017-09-13 NOTE — Progress Notes (Signed)
Notified spouse that patient has been relocated to room 112. Patient did not war CPAP throughout the shift, was unable to tolerate it. Patient is up in the chair at 0600.

## 2017-09-13 NOTE — Progress Notes (Signed)
Occupational Therapy Treatment Patient Details Name: Bonnie Holt MRN: [id number] DOB: 07/25/1945 Today's Date: 09/13/2017    History of present illness Pt admitted to hospital on 08/22/17 and has been diagnosed with sepsis secondary to UTI, acute renal failure, and acute respiratory failure. Pts hospital stay has been complicated by fall on 04/28/06 intubation on 08/31/17 and extubation on 09/02/17. Pt has a past medical history that includes breast cancer, coronary artery disease, and HTN. Pt currently has tunneled R IJ catheter for HD.    OT comments  Pt seen for OT tx this date. Spouse in room, both agreeable to OT. Pt noted with continued pitting edema to RUE, primarily in the hand and wrist. Retrograde massage to R fingers, hand, and wrist to minimize edema. Pt reported her hand feeling "better" afterwards. Positioned on pillows to elevate. Instructed pt/spouse on positioning.  Pt also instructed in gentle ROM and continued BUE there-ex to perform daily. Pt/spouse verbalized understanding. Pt pleased with her progress to date and continues to be motivated to participate. Will continue to progress.   Follow Up Recommendations  SNF    Equipment Recommendations       Recommendations for Other Services      Precautions / Restrictions Precautions Precautions: Fall;Other (comment) Precaution Comments: R IJ HD permcath now Restrictions Weight Bearing Restrictions: No       Mobility Bed Mobility                  Transfers                      Balance Overall balance assessment: Needs assistance   Sitting balance-Leahy Scale: Good                                     ADL either performed or assessed with clinical judgement   ADL Overall ADL's : Needs assistance/impaired                                             Vision Baseline Vision/History: Wears glasses Wears Glasses: At all times Patient Visual Report: No change from  baseline     Perception     Praxis      Cognition Arousal/Alertness: Awake/alert Behavior During Therapy: WFL for tasks assessed/performed Overall Cognitive Status: Within Functional Limits for tasks assessed                                          Exercises Other Exercises Other Exercises: Retrograde massage to R fingers, hand, and wrist to minimize edema. Pt reported her hand feeling "better" afterwards. Positioned on pillows to elevate. Instructed pt/spouse on positioning.   Shoulder Instructions       General Comments RUE wrist/hand continues to be edematous (+2 pitting), elevated on pillows to improve positioning    Pertinent Vitals/ Pain       Pain Assessment: No/denies pain  Home Living                                          Prior Functioning/Environment  Frequency  Min 2X/week        Progress Toward Goals  OT Goals(current goals can now be found in the care plan section)  Progress towards OT goals: Progressing toward goals  Acute Rehab OT Goals Patient Stated Goal: get better and go home OT Goal Formulation: With patient/family Time For Goal Achievement: 09/18/17 Potential to Achieve Goals: Good  Plan Discharge plan remains appropriate;Frequency remains appropriate    Co-evaluation                 AM-PAC PT "6 Clicks" Daily Activity     Outcome Measure   Help from another person eating meals?: None Help from another person taking care of personal grooming?: A Little Help from another person toileting, which includes using toliet, bedpan, or urinal?: A Lot Help from another person bathing (including washing, rinsing, drying)?: A Lot Help from another person to put on and taking off regular upper body clothing?: A Little Help from another person to put on and taking off regular lower body clothing?: A Lot 6 Click Score: 16    End of Session    OT Visit Diagnosis: Other abnormalities  of gait and mobility (R26.89);Other symptoms and signs involving cognitive function   Activity Tolerance Patient tolerated treatment well   Patient Left in bed;with call bell/phone within reach;with bed alarm set;with family/visitor present   Nurse Communication          Time: 7793-9030 OT Time Calculation (min): 27 min  Charges: OT General Charges $OT Visit: 1 Visit OT Treatments $Therapeutic Exercise: 23-37 mins   Jeni Salles, MPH, MS, OTR/L ascom [phone] 09/13/17, 11:36 AM

## 2017-09-14 LAB — BASIC METABOLIC PANEL
ANION GAP: 11 (ref 5–15)
BUN: 56 mg/dL — AB (ref 8–23)
CHLORIDE: 104 mmol/L (ref 98–111)
CO2: 26 mmol/L (ref 22–32)
Calcium: 9.1 mg/dL (ref 8.9–10.3)
Creatinine, Ser: 5.53 mg/dL — ABNORMAL HIGH (ref 0.44–1.00)
GFR calc Af Amer: 8 mL/min — ABNORMAL LOW (ref >60)
GFR, EST NON AFRICAN AMERICAN: 7 mL/min — AB (ref >60)
Glucose, Bld: 269 mg/dL — ABNORMAL HIGH (ref 70–99)
POTASSIUM: 3.5 mmol/L (ref 3.5–5.1)
SODIUM: 141 mmol/L (ref 135–145)

## 2017-09-14 LAB — RENAL FUNCTION PANEL
Albumin: 2.7 g/dL — ABNORMAL LOW (ref 3.5–5.0)
Anion gap: 12 (ref 5–15)
BUN: 58 mg/dL — ABNORMAL HIGH (ref 8–23)
CALCIUM: 9 mg/dL (ref 8.9–10.3)
CHLORIDE: 103 mmol/L (ref 98–111)
CO2: 25 mmol/L (ref 22–32)
CREATININE: 5.67 mg/dL — AB (ref 0.44–1.00)
GFR calc Af Amer: 8 mL/min — ABNORMAL LOW (ref >60)
GFR calc non Af Amer: 7 mL/min — ABNORMAL LOW (ref >60)
GLUCOSE: 269 mg/dL — AB (ref 70–99)
Phosphorus: 4.3 mg/dL (ref 2.5–4.6)
Potassium: 3.5 mmol/L (ref 3.5–5.1)
SODIUM: 140 mmol/L (ref 135–145)

## 2017-09-14 LAB — GLUCOSE, CAPILLARY
GLUCOSE-CAPILLARY: 173 mg/dL — AB (ref 70–99)
Glucose-Capillary: 279 mg/dL — ABNORMAL HIGH (ref 70–99)
Glucose-Capillary: 194 mg/dL — ABNORMAL HIGH (ref 70–99)
Glucose-Capillary: 222 mg/dL — ABNORMAL HIGH (ref 70–99)

## 2017-09-14 MED ORDER — ADULT MULTIVITAMIN W/MINERALS CH
1.0000 | ORAL_TABLET | Freq: Every day | ORAL | Status: DC
  Administered 2017-09-14 – 2017-09-15 (×2): 1 via ORAL
  Filled 2017-09-14 (×2): qty 1

## 2017-09-14 NOTE — Progress Notes (Signed)
Nutrition Follow-up  DOCUMENTATION CODES:   Morbid obesity  INTERVENTION:   MVI daily  Rena-vite daily  Liberalize diet   NUTRITION DIAGNOSIS:   Inadequate oral intake related to inability to eat, lethargy/confusion as evidenced by NPO status.  -improving   GOAL:   Patient will meet greater than or equal to 90% of their needs -progressing   MONITOR:   PO intake, Supplement acceptance, Labs, Weight trends, Skin, I & O's  ASSESSMENT:   72 year old female with PMHx of HTN, DM type 2, CAD, breast cancer s/p partial left mastectomy who was admitted with severe sepsis of unclear etiology, recent tick bite with concern for ehrlichiosis, probable PNA, acute encephalopathy, UTI, acute renal failure due to hypotension and sepsis requiring HD. Status post endoscopy with clipping of gastric vessels by Dr. Allen Norris on 6/25. Pt s/p RIJ cath 6/27  Pt with increased appetite and oral intake; eating 100% of meals today. Pt declines supplements at this time. Nepro discontinued by MD. Last HD treatment 6/27. RD will liberalize diet and add MVI. Per chart, pt's weight trending back down. Pt continues to have moderate edema in BLE. Urine output improved per MD. Diarrhea resolved.   Medications reviewed and include: vitamin D, insulin, melatonin, rena-vite, protonix  Labs reviewed: BUN 58(H), creat 5.67(H), P 4.3 wnl cbgs- 173, 279 x 24 hrs  Diet Order:   Diet Order           Diet renal/carb modified with fluid restriction Diet-HS Snack? Nothing; Fluid restriction: 1200 mL Fluid; Room service appropriate? Yes; Fluid consistency: Thin  Diet effective now         EDUCATION NEEDS:   Not appropriate for education at this time  Skin:  Skin Assessment: Skin Integrity Issues:(MSAD bilateral groin)  Last BM:  7/2- type 5  Height:   Ht Readings from Last 1 Encounters:  09/09/17 5\' 3"  (1.6 m)    Weight:   Wt Readings from Last 1 Encounters:  09/14/17 237 lb 9.6 oz (107.8 kg)    Ideal  Body Weight:  50 kg  BMI:  Body mass index is 42.09 kg/m.  Estimated Nutritional Needs:   Kcal:  5643-3295 (MSJ x 1.2-1.4)  Protein:  100-110 grams (0.9-1 grams/kg; 2 grams/kg IBW)  Fluid:  UOP + 1 L  Koleen Distance MS, RD, LDN Pager #- [phone] Office#- [phone] After Hours Pager: [phone]

## 2017-09-14 NOTE — Progress Notes (Signed)
Physical Therapy Treatment Patient Details Name: KADIAN BARCELLOS MRN: [id number] DOB: 19-Nov-1945 Today's Date: 09/14/2017    History of Present Illness Pt admitted to hospital on 08/22/17 and has been diagnosed with sepsis secondary to UTI, acute renal failure, and acute respiratory failure. Pts hospital stay has been complicated by fall on 7/90/24 intubation on 08/31/17 and extubation on 09/02/17. Pt has a past medical history that includes breast cancer, coronary artery disease, and HTN. Pt currently has tunneled R IJ catheter for HD.     PT Comments    Pt states that she is doing well today however pt and husband express frustration in changing of dialysis dates and projected d/c date. Pt states that she has been up to the bathroom with nursing on multiple occasions however fatigues during. Pt presents with increased edema most notably in R UE and R LE. Pt educated on keeping extremities elevated. Pt instructed in LE there-ex which she tolerated well. Pt performed 5 times sit to stand test completing safely in 31 seconds which is indicative of high falls risk. Pts O2 and HR monitored throughout session and remained WNL. Pt amb 30' requiring one seated rest break half way due to fatigue, pt demonstrates slow labored gait with increased B UE support needed. Pt continues to be drastically limited due to fatigue. Pt could benefit from continued skilled therapy at this time to improve deficits toward PLOF. PT will continue to work with pt at least 2x/week while admitted. D/c recommendations continue to be SNF.    Follow Up Recommendations  SNF     Equipment Recommendations  None recommended by PT    Recommendations for Other Services       Precautions / Restrictions Precautions Precautions: Fall;Other (comment) Precaution Comments: R IJ HD permcath now Restrictions Weight Bearing Restrictions: No    Mobility  Bed Mobility               General bed mobility comments: deferred this  session. Up in chair upon arrival prefers to remain in chair at conclusion of session  Transfers Overall transfer level: Modified independent Equipment used: Rolling walker (2 wheeled) Transfers: Sit to/from Stand Sit to Stand: Supervision         General transfer comment: pt requires close supervision to perform sit<>stand safely. requires decreasing verbal cues for hand placement and safety.  Ambulation/Gait Ambulation/Gait assistance: Min guard;Min assist(chair follow) Gait Distance (Feet): 30 Feet Assistive device: Rolling walker (2 wheeled) Gait Pattern/deviations: Shuffle     General Gait Details: pt amb 30' total with one seated rest break with CGA to min assist and RW. pt continues to fatigue very easy and states that throughout amb she feels weak.   Stairs             Wheelchair Mobility    Modified Rankin (Stroke Patients Only)       Balance                                            Cognition Arousal/Alertness: Awake/alert Behavior During Therapy: WFL for tasks assessed/performed Overall Cognitive Status: Within Functional Limits for tasks assessed                                 General Comments: WFL, requires min verbal cuing throughout session for safety  Exercises Other Exercises Other Exercises: pt instructed and performed B LE exercises x20 with verbal cuing for technique including seated marching, LAQ, hip abd. Reviewed ther-ex packet with patient.    General Comments General comments (skin integrity, edema, etc.): pt displays pitting edema primarily of R UE and R LE. Educated on keeping extremities elevated.      Pertinent Vitals/Pain Pain Assessment: No/denies pain    Home Living                      Prior Function            PT Goals (current goals can now be found in the care plan section) Acute Rehab PT Goals Patient Stated Goal: get better and go home PT Goal Formulation: With  patient Time For Goal Achievement: 09/17/17 Potential to Achieve Goals: Good Progress towards PT goals: Progressing toward goals    Frequency    Min 2X/week      PT Plan Current plan remains appropriate    Co-evaluation              AM-PAC PT "6 Clicks" Daily Activity  Outcome Measure  Difficulty turning over in bed (including adjusting bedclothes, sheets and blankets)?: A Little Difficulty moving from lying on back to sitting on the side of the bed? : A Little Difficulty sitting down on and standing up from a chair with arms (e.g., wheelchair, bedside commode, etc,.)?: A Little Help needed moving to and from a bed to chair (including a wheelchair)?: A Little Help needed walking in hospital room?: A Lot Help needed climbing 3-5 steps with a railing? : Total 6 Click Score: 15    End of Session Equipment Utilized During Treatment: Gait belt Activity Tolerance: Patient limited by fatigue;Patient limited by pain Patient left: in chair;with chair alarm set;with call bell/phone within reach;with family/visitor present Nurse Communication: Mobility status;Other (comment)(pt requesting LE stockings for edema) PT Visit Diagnosis: Unsteadiness on feet (R26.81);Other abnormalities of gait and mobility (R26.89);Muscle weakness (generalized) (M62.81);Ataxic gait (R26.0);Difficulty in walking, not elsewhere classified (R26.2)     Time: 9480-1655 PT Time Calculation (min) (ACUTE ONLY): 39 min  Charges:                       G Codes:       Gregori Abril [name], SPT    [place]ad 09/14/2017, 12:05 PM

## 2017-09-14 NOTE — Progress Notes (Signed)
[facility]  ROUNDING NOTE   Subjective:   Husband at bedside.  Patient is doing better.  Asking about going home.  She is able to eat and drink without nausea or vomiting Continues to have large amount of edema UOP >2900 cc S Cr 5.67  Objective:  Vital signs in last 24 hours:  Temp:  [97.8 F (36.6 C)-99.1 F (37.3 C)] 98 F (36.7 C) (07/02 0836) Pulse Rate:  [63-73] 63 (07/02 0836) Resp:  [18-20] 20 (07/02 0836) BP: (150-183)/(58-76) 156/58 (07/02 0836) SpO2:  [95 %-100 %] 100 % (07/02 0836) Weight:  [107.8 kg (237 lb 9.6 oz)] 107.8 kg (237 lb 9.6 oz) (07/02 0510)  Weight change:  Filed Weights   09/11/17 0323 09/12/17 0316 09/14/17 0510  Weight: 107.8 kg (237 lb 9.6 oz) 108.3 kg (238 lb 11.2 oz) 107.8 kg (237 lb 9.6 oz)    Intake/Output: I/O last 3 completed shifts: In: 120 [P.O.:120] Out: 3526 [Urine:3525; Stool:1]   Intake/Output this shift:  Total I/O In: 240 [P.O.:240] Out: -   Physical Exam: General: Laying in bed  Head: [place]/AT  Eyes: Anicteric, moist oral mucus membranes  Neck: Supple,   Lungs:  clear  Heart: Irregular rhythm  Abdomen:  Obese, nontender  Extremities: 2+ peripheral edema.  Neurologic: Nonfocal, moving all four extremities  Skin: Pink rash over lower abdomen and legs b/l-improving  Access: RIJ permcath    Basic Metabolic Panel: Recent Labs  Lab 09/10/17 0828 09/11/17 0459 09/12/17 1140 09/13/17 0557 09/14/17 1015  NA 139 137 139 141 140  141  K 3.5 3.3* 3.5 3.3* 3.5  3.5  CL 102 101 102 104 103  104  CO2 27 27 27 28 25  26   GLUCOSE 128* 147* 224* 187* 269*  269*  BUN 26* 32* 47* 54* 58*  56*  CREATININE 4.07* 4.91* 5.07* 5.38* 5.67*  5.53*  CALCIUM 8.6* 8.5* 8.8* 8.8* 9.0  9.1  PHOS 3.5 3.8 3.4 3.8 4.3    Liver Function Tests: Recent Labs  Lab 09/10/17 0828 09/11/17 0459 09/12/17 1140 09/13/17 0557 09/14/17 1015  ALBUMIN 2.5* 2.4* 2.6* 2.5* 2.7*   No results for input(s): LIPASE, AMYLASE in  the last 168 hours. No results for input(s): AMMONIA in the last 168 hours.  CBC: Recent Labs  Lab 09/07/17 1940 09/08/17 0353 09/09/17 1136 09/13/17 0557  WBC  --  6.5 5.8 7.4  NEUTROABS  --  4.6  --   --   HGB 9.9* 10.1* 9.6* 9.3*  HCT 28.7* 29.1* 28.5* 27.5*  MCV  --  91.2 92.3 91.3  PLT  --  154 163 180    Cardiac Enzymes: No results for input(s): CKTOTAL, CKMB, CKMBINDEX, TROPONINI in the last 168 hours.  BNP: Invalid input(s): POCBNP  CBG: Recent Labs  Lab 09/13/17 0735 09/13/17 1140 09/13/17 1654 09/13/17 2037 09/14/17 0751  GLUCAP 163* 237* 239* 221* 173*    Microbiology: Results for orders placed or performed during the hospital encounter of 08/22/17  Blood Culture (routine x 2)     Status: None   Collection Time: 08/22/17 12:52 PM  Result Value Ref Range Status   Specimen Description BLOOD RIGHT HAND  Final   Special Requests   Final    BOTTLES DRAWN AEROBIC AND ANAEROBIC Blood Culture adequate volume   Culture   Final    NO GROWTH 5 DAYS Performed at [facility], [address], [place], [place] [address]    Report Status 08/27/2017 FINAL  Final  Urine culture     Status: Abnormal   Collection Time: 08/22/17 12:52 PM  Result Value Ref Range Status   Specimen Description   Final    URINE, RANDOM Performed at [facility], [address] [place], [place] [address]    Special Requests   Final    NONE Performed at [facility], [address] [place], [place] [address]    Culture (A)  Final    >=100,000 COLONIES/mL KLEBSIELLA PNEUMONIAE 60,000 COLONIES/mL PROTEUS MIRABILIS    Report Status 08/24/2017 FINAL  Final   Organism ID, Bacteria KLEBSIELLA PNEUMONIAE (A)  Final   Organism ID, Bacteria PROTEUS MIRABILIS (A)  Final      Susceptibility   Klebsiella pneumoniae - MIC*    AMPICILLIN RESISTANT Resistant     CEFAZOLIN <=4 SENSITIVE Sensitive     CEFTRIAXONE <=1 SENSITIVE Sensitive     CIPROFLOXACIN <=0.25  SENSITIVE Sensitive     GENTAMICIN <=1 SENSITIVE Sensitive     IMIPENEM <=0.25 SENSITIVE Sensitive     NITROFURANTOIN 64 INTERMEDIATE Intermediate     TRIMETH/SULFA <=20 SENSITIVE Sensitive     AMPICILLIN/SULBACTAM 4 SENSITIVE Sensitive     PIP/TAZO <=4 SENSITIVE Sensitive     Extended ESBL NEGATIVE Sensitive     * >=100,000 COLONIES/mL KLEBSIELLA PNEUMONIAE   Proteus mirabilis - MIC*    AMPICILLIN <=2 SENSITIVE Sensitive     CEFAZOLIN <=4 SENSITIVE Sensitive     CEFTRIAXONE <=1 SENSITIVE Sensitive     CIPROFLOXACIN <=0.25 SENSITIVE Sensitive     GENTAMICIN <=1 SENSITIVE Sensitive     IMIPENEM 1 SENSITIVE Sensitive     NITROFURANTOIN 128 RESISTANT Resistant     TRIMETH/SULFA <=20 SENSITIVE Sensitive     AMPICILLIN/SULBACTAM <=2 SENSITIVE Sensitive     PIP/TAZO <=4 SENSITIVE Sensitive     * 60,000 COLONIES/mL PROTEUS MIRABILIS  Blood Culture (routine x 2)     Status: None   Collection Time: 08/22/17 12:53 PM  Result Value Ref Range Status   Specimen Description BLOOD RIGHT ARM  Final   Special Requests   Final    BOTTLES DRAWN AEROBIC AND ANAEROBIC Blood Culture results may not be optimal due to an excessive volume of blood received in culture bottles   Culture   Final    NO GROWTH 5 DAYS Performed at [facility], [address] [place], [place] [address]    Report Status 08/27/2017 FINAL  Final  MRSA PCR Screening     Status: None   Collection Time: 08/22/17  9:18 PM  Result Value Ref Range Status   MRSA by PCR NEGATIVE NEGATIVE Final    Comment:        The GeneXpert MRSA Assay (FDA approved for NASAL specimens only), is one component of a comprehensive MRSA colonization surveillance program. It is not intended to diagnose MRSA infection nor to guide or monitor treatment for MRSA infections. Performed at [facility], [address] [place], [place] [address]   Culture, blood (Routine X 2) w Reflex to ID Panel     Status: None   Collection Time:  08/29/17  9:31 PM  Result Value Ref Range Status   Specimen Description BLOOD LEFT FOOT  Final   Special Requests   Final    BOTTLES DRAWN AEROBIC AND ANAEROBIC Blood Culture adequate volume   Culture   Final    NO GROWTH 5 DAYS Performed at [facility], [address] [place], [place] [address]  Report Status 09/03/2017 FINAL  Final  Culture, blood (Routine X 2) w Reflex to ID Panel     Status: None   Collection Time: 08/29/17  9:50 PM  Result Value Ref Range Status   Specimen Description BLOOD RIGHT ANKLE  Final   Special Requests   Final    BOTTLES DRAWN AEROBIC AND ANAEROBIC Blood Culture adequate volume   Culture   Final    NO GROWTH 5 DAYS Performed at [facility], [address], [place], [place] [address]    Report Status 09/03/2017 FINAL  Final  C difficile quick scan w PCR reflex     Status: None   Collection Time: 09/02/17 12:31 PM  Result Value Ref Range Status   C Diff antigen NEGATIVE NEGATIVE Final   C Diff toxin NEGATIVE NEGATIVE Final   C Diff interpretation No C. difficile detected.  Final    Comment: Performed at [facility], [address] [place], [place] [address]  CULTURE, BLOOD (ROUTINE X 2) w Reflex to ID Panel     Status: None   Collection Time: 09/02/17  7:25 PM  Result Value Ref Range Status   Specimen Description BLOOD BLOOD RIGHT HAND  Final   Special Requests   Final    BOTTLES DRAWN AEROBIC AND ANAEROBIC Blood Culture adequate volume   Culture   Final    NO GROWTH 5 DAYS Performed at [facility], [address] [place], [place] [address]    Report Status 09/07/2017 FINAL  Final  CULTURE, BLOOD (ROUTINE X 2) w Reflex to ID Panel     Status: None   Collection Time: 09/02/17  7:25 PM  Result Value Ref Range Status   Specimen Description BLOOD BLOOD RIGHT HAND  Final   Special Requests   Final    BOTTLES DRAWN AEROBIC AND ANAEROBIC Blood Culture results may not be optimal due to an inadequate  volume of blood received in culture bottles   Culture   Final    NO GROWTH 5 DAYS Performed at [facility], [address], [place], [place] [address]    Report Status 09/07/2017 FINAL  Final  Urine Culture     Status: Abnormal   Collection Time: 09/03/17  4:23 AM  Result Value Ref Range Status   Specimen Description   Final    URINE, RANDOM Performed at [facility], [address], [place], [place] [address]    Special Requests   Final    NONE Performed at [facility], [address], [place], [place] [address]    Culture (A)  Final    >=100,000 COLONIES/mL DIPHTHEROIDS(CORYNEBACTERIUM SPECIES) Standardized susceptibility testing for this organism is not available. Performed at [facility], [place] [address], [place], [place] [address]    Report Status 09/04/2017 FINAL  Final    Coagulation Studies: No results for input(s): LABPROT, INR in the last 72 hours.  Urinalysis: No results for input(s): COLORURINE, LABSPEC, PHURINE, GLUCOSEU, HGBUR, BILIRUBINUR, KETONESUR, PROTEINUR, UROBILINOGEN, NITRITE, LEUKOCYTESUR in the last 72 hours.  Invalid input(s): APPERANCEUR    Imaging: No results found.   Medications:    . amiodarone  200 mg Oral BID  . atorvastatin  80 mg Oral q1800  . chlorhexidine  15 mL Mouth Rinse BID  . Chlorhexidine Gluconate Cloth  6 each Topical Q0600  . cholecalciferol  5,000 Units Oral Daily  . insulin aspart  0-5 Units Subcutaneous QHS  . insulin aspart  0-9  Units Subcutaneous TID WC  . insulin glargine  10 Units Subcutaneous Daily  . letrozole  2.5 mg Oral Daily  . mouth rinse  15 mL Mouth Rinse q12n4p  . Melatonin  10 mg Oral QHS  . metoprolol succinate  50 mg Oral Daily  . multivitamin  1 tablet Oral QHS  . nystatin cream   Topical BID  . pantoprazole  40 mg Oral QAC breakfast  . sodium chloride flush  10-40 mL Intracatheter Q12H   acetaminophen **OR** acetaminophen, bisacodyl, ipratropium-albuterol,  metoprolol tartrate, [DISCONTINUED] ondansetron **OR** ondansetron (ZOFRAN) IV, sodium chloride flush  Assessment/ Plan:  [name] is a 72 y.o. white female with diabetes mellitus type II, coronary artery disease, obstructive sleep apnea, history of breast cancer, was admitted on6/9/2019with fever, chills, tick bite (left groin and back)  Brief hospital course: Admitted on 08/22/2017 for sepsis and urinary tract infection. There was concern for tick borne illness. Urine grew corynebacterium species. Patient required hemodialysis on 6/16 first treatment. Last hemodialysis treatment was 6/27. Permcath placed on 6/27. Since then has not required hemodialysis.   1. Acute renal failure on chronic kidney disease stage III Baseline creatinine of 1.1, GFR of 49 on 06/01/17  UOP has  Improved. No uremic symptoms. Electrolytes and volume status are acceptable. No acute indication of HD at present - Consider removing foley catheter - BUN/Cr is expected to improve now that urine output is significantly better  2. Sepsis with Urinary tract infection and recent tick bite (concern for tick borne illness). Urine culture with corynebacterium species. Completed course of antibiotics.   3. Anemia with renal failure: now with peptic ulcer disease/GIB and acute blood loss. Status post endoscopy with clipping of gastric vessels by Dr. Allen Norris on 6/25  Status post 2 units PRBC transfusion 6/25.   4. Generalized edema - good diuresis without diuretics - avoid hypotension  5. Hypokalemia - replace PRN   LOS: 23 Mennie Spiller 7/2/201911:26 AM

## 2017-09-14 NOTE — Progress Notes (Signed)
Inpatient Diabetes Program Recommendations  AACE/ADA: New Consensus Statement on Inpatient Glycemic Control (2015)  Target Ranges:  Prepandial:   less than 140 mg/dL      Peak postprandial:   less than 180 mg/dL (1-2 hours)      Critically ill patients:  140 - 180 mg/dL   Lab Results  Component Value Date   GLUCAP 279 (H) 09/14/2017    Review of Glycemic ControlResults for CHAVELA, JUSTINIANO (MRN [id number]) as of 09/14/2017 12:16  Ref. Range 09/13/2017 11:40 09/13/2017 16:54 09/13/2017 20:37 09/14/2017 07:51 09/14/2017 11:46  Glucose-Capillary Latest Ref Range: 70 - 99 mg/dL 237 (H) 239 (H) 221 (H) 173 (H) 279 (H)    Diabetes history: Type 2 DM  Outpatient Diabetes medications: Glucotrol XL 5 mg daily, Metformin 1000 mg bid Current orders for Inpatient glycemic control:  Novolog sensitive tid with meals and HS, Lantus 10 units daily Inpatient Diabetes Program Recommendations:  May consider adding Novolog meal coverage 3 units tid with meals.  ? Was patient taking metformin prior to admit. Metformin should not be resumed due to elevated creatinine and reduced creatinine clearance. She may need insulin at d/c?  Will follow.  Thanks,  Adah Perl, RN, BC-ADM Inpatient Diabetes Coordinator Pager [phone] (8a-5p)

## 2017-09-14 NOTE — Progress Notes (Signed)
Patient ID: Bonnie Holt, female   DOB: Jul 28, 1945, 72 y.o.   MRN: [id number]    [employer] PROGRESS NOTE  Bonnie Holt ZDG:[id number] DOB: 02/11/1946 DOA: 08/22/2017 PCP: Bonnie Ruths, MD  HPI/Subjective:  c/o swelling in legs, still making good uop   Objective: Vitals:   09/14/17 0836 09/14/17 1419  BP: (!) 156/58 (!) 130/51  Pulse: 63 66  Resp: 20 18  Temp: 98 F (36.7 C) 98.1 F (36.7 C)  SpO2: 100% 100%    Filed Weights   09/11/17 0323 09/12/17 0316 09/14/17 0510  Weight: 107.8 kg (237 lb 9.6 oz) 108.3 kg (238 lb 11.2 oz) 107.8 kg (237 lb 9.6 oz)    ROS: Review of Systems  Constitutional: Negative for chills and fever.  Eyes: Negative for blurred vision.  Respiratory: Negative for cough and shortness of breath.   Cardiovascular: Negative for chest pain.  Gastrointestinal: Negative for abdominal pain, constipation, diarrhea, nausea and vomiting.  Genitourinary: Negative for dysuria.  Musculoskeletal: Negative for joint pain.  Neurological: Negative for dizziness and headaches.   Exam: Physical Exam  HENT:  Nose: No mucosal edema.  Mouth/Throat: No oropharyngeal exudate or posterior oropharyngeal edema.  Eyes: Pupils are equal, round, and reactive to light. Conjunctivae, EOM and lids are normal.  Neck: No JVD present. Carotid bruit is not present. No edema present. No thyroid mass and no thyromegaly present.  Cardiovascular: S1 normal and S2 normal. Exam reveals no gallop.  No murmur heard. Pulses:      Dorsalis pedis pulses are 2+ on the right side, and 2+ on the left side.  Respiratory: No respiratory distress. She has decreased breath sounds in the right lower field and the left lower field. She has no wheezes. She has no rhonchi. She has no rales.  GI: Soft. Bowel sounds are normal. There is no tenderness.  Musculoskeletal:       Right ankle: She exhibits swelling.       Left ankle: She exhibits swelling.  Lymphadenopathy:    She has no  cervical adenopathy.  Neurological: She is alert. No cranial nerve deficit.  Able to straight leg raise bilaterally.  Skin: Skin is warm. No rash noted. Nails show no clubbing.  Psychiatric: She has a normal mood and affect.      Data Reviewed: Basic Metabolic Panel: Recent Labs  Lab 09/10/17 0828 09/11/17 0459 09/12/17 1140 09/13/17 0557 09/14/17 1015  NA 139 137 139 141 140  141  K 3.5 3.3* 3.5 3.3* 3.5  3.5  CL 102 101 102 104 103  104  CO2 27 27 27 28 25  26   GLUCOSE 128* 147* 224* 187* 269*  269*  BUN 26* 32* 47* 54* 58*  56*  CREATININE 4.07* 4.91* 5.07* 5.38* 5.67*  5.53*  CALCIUM 8.6* 8.5* 8.8* 8.8* 9.0  9.1  PHOS 3.5 3.8 3.4 3.8 4.3   Liver Function Tests: Recent Labs  Lab 09/10/17 0828 09/11/17 0459 09/12/17 1140 09/13/17 0557 09/14/17 1015  ALBUMIN 2.5* 2.4* 2.6* 2.5* 2.7*   CBC: Recent Labs  Lab 09/07/17 1940 09/08/17 0353 09/09/17 1136 09/13/17 0557  WBC  --  6.5 5.8 7.4  NEUTROABS  --  4.6  --   --   HGB 9.9* 10.1* 9.6* 9.3*  HCT 28.7* 29.1* 28.5* 27.5*  MCV  --  91.2 92.3 91.3  PLT  --  154 163 180   Is CBG: Recent Labs  Lab 09/13/17 1140 09/13/17 1654 09/13/17 2037 09/14/17 0751  09/14/17 1146  GLUCAP 237* 239* 221* 173* 279*    No results found for this or any previous visit (from the past 240 hour(s)).    Scheduled Meds: . amiodarone  200 mg Oral BID  . atorvastatin  80 mg Oral q1800  . chlorhexidine  15 mL Mouth Rinse BID  . Chlorhexidine Gluconate Cloth  6 each Topical Q0600  . cholecalciferol  5,000 Units Oral Daily  . insulin aspart  0-5 Units Subcutaneous QHS  . insulin aspart  0-9 Units Subcutaneous TID WC  . insulin glargine  10 Units Subcutaneous Daily  . letrozole  2.5 mg Oral Daily  . mouth rinse  15 mL Mouth Rinse q12n4p  . Melatonin  10 mg Oral QHS  . metoprolol succinate  50 mg Oral Daily  . multivitamin  1 tablet Oral QHS  . nystatin cream   Topical BID  . pantoprazole  40 mg Oral QAC breakfast   . sodium chloride flush  10-40 mL Intracatheter Q12H   Continuous Infusions:   Assessment/Plan:  1. Sepsis with multiorgan failure and initial hypotension.  Likely combination of urinary tract infection versus tickborne disease.  Finished antibiotics. 2. Acute hypoxic respiratory failure with fluid overload.  Patient extubated on last Thursday.  Tapered down on nasal cannula 2 L.  Continue incentive spirometry 3. Acute kidney injury.   permacath placed creatinine trending up nephrology following dialysis on hold for now due to good urine output 4. Acute blood loss anemia with upper GI bleed.  Endoscopy showing irritation from NG tube.  NG tube removed.  Patient on po Protonix.  Hemoglobin responded well to transfusion.   5. Diarrhea.  Resolved 6. Elevated troponin demand ischemia in the setting of sepsis. 7. Atrial fibrillation with rapid ventricular response.  On amiodarone oral tablets.  Currently in normal sinus rhythm.  No blood thinners with recent GI bleed.  8. Thrombocytopenia secondary to sepsis.  Platelet count stable  9. history of breast cancer on Femara 10. Morbid obesity weight loss needed 11. History of sleep apnea on BiPAP at home  12. type 2 diabetes on sliding scale 13. Weakness.  Physical therapy needed daily to get her stronger.  Patient will need rehab 14. Essential hypertension continue Toprol  Code Status:     Code Status Orders  (From admission, onward)        Start     Ordered   08/22/17 1612  Full code  Continuous     08/22/17 1611    Code Status History    This patient has a current code status but no historical code status.     Family Communication: Husband at the bedside Disposition Plan: Likely will need rehab  Consultants:  Nephrology  Time spent: 27 minutes.  Case discussed with nephrology.  Bonnie Holt [facility]

## 2017-09-14 NOTE — Plan of Care (Signed)
  Problem: Education: Goal: Knowledge of General Education information will improve Outcome: Progressing   Problem: Fluid Volume: Goal: Hemodynamic stability will improve Outcome: Progressing   Problem: Clinical Measurements: Goal: Diagnostic test results will improve Outcome: Progressing Goal: Signs and symptoms of infection will decrease Outcome: Progressing   Problem: Respiratory: Goal: Ability to maintain adequate ventilation will improve Outcome: Progressing

## 2017-09-15 DIAGNOSIS — J9600 Acute respiratory failure, unspecified whether with hypoxia or hypercapnia: Secondary | ICD-10-CM | POA: Diagnosis not present

## 2017-09-15 DIAGNOSIS — D62 Acute posthemorrhagic anemia: Secondary | ICD-10-CM | POA: Diagnosis not present

## 2017-09-15 DIAGNOSIS — J9601 Acute respiratory failure with hypoxia: Secondary | ICD-10-CM | POA: Diagnosis not present

## 2017-09-15 DIAGNOSIS — I4891 Unspecified atrial fibrillation: Secondary | ICD-10-CM | POA: Diagnosis not present

## 2017-09-15 DIAGNOSIS — G4709 Other insomnia: Secondary | ICD-10-CM | POA: Diagnosis not present

## 2017-09-15 DIAGNOSIS — E119 Type 2 diabetes mellitus without complications: Secondary | ICD-10-CM | POA: Diagnosis not present

## 2017-09-15 DIAGNOSIS — K219 Gastro-esophageal reflux disease without esophagitis: Secondary | ICD-10-CM | POA: Diagnosis not present

## 2017-09-15 DIAGNOSIS — R41841 Cognitive communication deficit: Secondary | ICD-10-CM | POA: Diagnosis not present

## 2017-09-15 DIAGNOSIS — N179 Acute kidney failure, unspecified: Secondary | ICD-10-CM | POA: Diagnosis not present

## 2017-09-15 DIAGNOSIS — Z8719 Personal history of other diseases of the digestive system: Secondary | ICD-10-CM | POA: Diagnosis not present

## 2017-09-15 DIAGNOSIS — R21 Rash and other nonspecific skin eruption: Secondary | ICD-10-CM | POA: Diagnosis not present

## 2017-09-15 DIAGNOSIS — E1165 Type 2 diabetes mellitus with hyperglycemia: Secondary | ICD-10-CM | POA: Diagnosis not present

## 2017-09-15 DIAGNOSIS — D649 Anemia, unspecified: Secondary | ICD-10-CM | POA: Diagnosis not present

## 2017-09-15 DIAGNOSIS — M6281 Muscle weakness (generalized): Secondary | ICD-10-CM | POA: Diagnosis not present

## 2017-09-15 DIAGNOSIS — R601 Generalized edema: Secondary | ICD-10-CM | POA: Diagnosis not present

## 2017-09-15 DIAGNOSIS — A419 Sepsis, unspecified organism: Secondary | ICD-10-CM | POA: Diagnosis not present

## 2017-09-15 DIAGNOSIS — R1312 Dysphagia, oropharyngeal phase: Secondary | ICD-10-CM | POA: Diagnosis not present

## 2017-09-15 DIAGNOSIS — E785 Hyperlipidemia, unspecified: Secondary | ICD-10-CM | POA: Diagnosis not present

## 2017-09-15 DIAGNOSIS — R262 Difficulty in walking, not elsewhere classified: Secondary | ICD-10-CM | POA: Diagnosis not present

## 2017-09-15 DIAGNOSIS — I10 Essential (primary) hypertension: Secondary | ICD-10-CM | POA: Diagnosis not present

## 2017-09-15 DIAGNOSIS — J9690 Respiratory failure, unspecified, unspecified whether with hypoxia or hypercapnia: Secondary | ICD-10-CM | POA: Diagnosis not present

## 2017-09-15 DIAGNOSIS — C7981 Secondary malignant neoplasm of breast: Secondary | ICD-10-CM | POA: Diagnosis not present

## 2017-09-15 DIAGNOSIS — I25118 Atherosclerotic heart disease of native coronary artery with other forms of angina pectoris: Secondary | ICD-10-CM | POA: Diagnosis not present

## 2017-09-15 DIAGNOSIS — Z853 Personal history of malignant neoplasm of breast: Secondary | ICD-10-CM | POA: Diagnosis not present

## 2017-09-15 DIAGNOSIS — Z7401 Bed confinement status: Secondary | ICD-10-CM | POA: Diagnosis not present

## 2017-09-15 LAB — BASIC METABOLIC PANEL
Anion gap: 8 (ref 5–15)
BUN: 58 mg/dL — ABNORMAL HIGH (ref 8–23)
CHLORIDE: 105 mmol/L (ref 98–111)
CO2: 27 mmol/L (ref 22–32)
CREATININE: 5.25 mg/dL — AB (ref 0.44–1.00)
Calcium: 9 mg/dL (ref 8.9–10.3)
GFR calc Af Amer: 9 mL/min — ABNORMAL LOW (ref >60)
GFR, EST NON AFRICAN AMERICAN: 7 mL/min — AB (ref >60)
GLUCOSE: 198 mg/dL — AB (ref 70–99)
Potassium: 3.8 mmol/L (ref 3.5–5.1)
SODIUM: 140 mmol/L (ref 135–145)

## 2017-09-15 LAB — GLUCOSE, CAPILLARY
Glucose-Capillary: 230 mg/dL — ABNORMAL HIGH (ref 70–99)
Glucose-Capillary: 237 mg/dL — ABNORMAL HIGH (ref 70–99)

## 2017-09-15 MED ORDER — IPRATROPIUM-ALBUTEROL 0.5-2.5 (3) MG/3ML IN SOLN: 3.0000 mL | Freq: Four times a day (QID) | RESPIRATORY_TRACT | Status: DC | PRN

## 2017-09-15 MED ORDER — PANTOPRAZOLE SODIUM 40 MG PO TBEC: 40.0000 mg | DELAYED_RELEASE_TABLET | Freq: Every day | ORAL | Status: DC

## 2017-09-15 MED ORDER — ACETAMINOPHEN 325 MG PO TABS: 650.0000 mg | ORAL_TABLET | Freq: Four times a day (QID) | ORAL | Status: DC | PRN

## 2017-09-15 MED ORDER — MELATONIN 5 MG PO TABS: 10.0000 mg | ORAL_TABLET | Freq: Every day | ORAL | 0 refills | Status: DC

## 2017-09-15 MED ORDER — CHLORHEXIDINE GLUCONATE 0.12 % MT SOLN: 15.0000 mL | Freq: Two times a day (BID) | OROMUCOSAL | 0 refills | Status: DC

## 2017-09-15 MED ORDER — AMIODARONE HCL 200 MG PO TABS: 200.0000 mg | ORAL_TABLET | Freq: Two times a day (BID) | ORAL | Status: DC

## 2017-09-15 MED ORDER — INSULIN ASPART 100 UNIT/ML ~~LOC~~ SOLN: 0.0000 [IU] | Freq: Every day | SUBCUTANEOUS | 11 refills | Status: DC

## 2017-09-15 MED ORDER — INSULIN GLARGINE 100 UNIT/ML ~~LOC~~ SOLN: 10.0000 [IU] | Freq: Every day | SUBCUTANEOUS | 11 refills | Status: DC

## 2017-09-15 MED ORDER — NYSTATIN 100000 UNIT/GM EX CREA: TOPICAL_CREAM | Freq: Two times a day (BID) | CUTANEOUS | 0 refills | Status: DC

## 2017-09-15 MED ORDER — RENA-VITE PO TABS: 1.0000 | ORAL_TABLET | Freq: Every day | ORAL | 0 refills | Status: DC

## 2017-09-15 NOTE — Clinical Social Work Note (Signed)
Patient is medically ready for discharge today. CSW notified patient and husband Rayleigh Gillyard at bedside of discharge. Patient will be transported by EMS due to the need for oxygen. CSW notified Tammy at [facility] of discharge today. RN will call report and call for transport.   [place], [place]

## 2017-09-15 NOTE — Discharge Summary (Signed)
[facility] - [place] at [facility], Bonnie Holt y.o., DOB 01/17/1946, MRN [id number]. Admission date: 08/22/2017 Discharge Date 09/15/2017 Primary MD Kirk Ruths, MD Admitting Physician Idelle Crouch, MD  Admission Diagnosis  Severe sepsis ([place]) [A41.9, R65.20]  Discharge Diagnosis   Principal Problem:   Sepsis [facility]) with multiorgan failure due to combination a UTI versus tickborne disease status post treatment with antibody Acute hypoxic respiratory failure with fluid overload requiring intubation on 2 L of oxygen wean as tolerated Acute kidney injury due to ATN status post PermCath placed Diarrhea Elevated troponin due to demand ischemia Atrial fibrillation with rapid ventricular rate Thrombocytopenia secondary to sepsis now resolved Acute gastric bleed with antral ulcer History of breast cancer CAD (coronary artery disease) Diabetes ([place]) Elevated liver function tests          Hospital Course Bonnie Holt is a 72 y.o. female has a past medical history significant for DM, HTN, and CAD now with 3 day hx of fever, chills, and back apin. Now confused and lethargic. In ER, pt febrile and tachycardic. Lactic acid elevated c/w with sepsis. UA abnormal and CXR negative.  Patient was admitted.  She was noted to have severe sepsis condition continued to deteriorate.  She required intubation.  Patient was subsequently extubated.  Patient also during hospitalization developed acute renal failure.  And required dialysis.  Patient's dialysis has been held she has been making urine urine output her creatinine is trending down.  Patient also was noted to have She was noted to have sepsis which was thought to be due to possible tickborne disease and UTI.  He was treated with antibiotics.  Now afebrile.  She is finished course of her antibiotics. Patient also developed A. fib with RVR during hospitalization she started on amiodarone heart rate is stable.  Due to gastric  bleeding noted during hospitalization she is not a candidate for anticoagulation currently.   1. Patient to have sliding scale at the nursing home 2.  Leave PermCath in until seen by nephrology next week 3.  Patient to have a BMP check 09/20/2017 4.  Incentive spirometry to be used      Consults  cardiology, NEHPROLOGY, INTESIVIST, GI  Significant Tests:  See full reports for all details     Dg Abd 1 View  Result Date: 09/06/2017 CLINICAL DATA:  Ileus and abdominal pain. Recent bowel movement after an enema. EXAM: ABDOMEN - 1 VIEW COMPARISON:  Abdominal radiograph of September 05, 2017 FINDINGS: There is a moderate amount of gas within normal caliber:. The a few loops of nondistended gas-filled small bowel are evident. No free extraluminal gas collections are observed. An esophagogastric tube is present whose tip in proximal port project over the gastric body. There is an ovoid calcification measuring approximately 1.5 x 2 cm in the right upper quadrant which may reflect a gallstone. The bony structures exhibit no acute abnormality. There is a radiodense structure in the projecting over the lower pelvis similar to that seen on yesterday's study which is of uncertain significance. IMPRESSION: Moderate amount of gas within large and small bowel which may reflect residual mild ileus. The stool burden is not excessive. No evidence of obstruction or perforation. Probable gallstone. The esophagogastric tube is in reasonable position. Faintly radiodense cylindrical appearing structure projecting over the lower pelvis in the midline similar to that seen on yesterday's study. Electronically Signed   By: David  Martinique M.D.   On: 09/06/2017 14:20   Dg Abd 1  View  Result Date: 09/05/2017 CLINICAL DATA:  Ileus. EXAM: ABDOMEN - 1 VIEW COMPARISON:  08/31/2017 and chest x-ray 09/02/2017 FINDINGS: Nasogastric tube has tip over the stomach in the left upper quadrant. Bowel gas pattern is nonobstructive with air  throughout the colon. No free peritoneal air. Findings suggesting small left pleural effusion. Left IJ central venous catheter tip unchanged. Right-sided PICC line present. Degenerative change of the spine IMPRESSION: Nonobstructive bowel gas pattern. Tubes and lines as described with nasogastric tube tip over the stomach in the left upper quadrant. Electronically Signed   By: Marin Olp M.D.   On: 09/05/2017 15:58   Dg Abd 1 View  Result Date: 08/31/2017 CLINICAL DATA:  Nasogastric tube placement. EXAM: ABDOMEN - 1 VIEW COMPARISON:  None. FINDINGS: The bowel gas pattern is normal. Distal tip of feeding tube is seen in expected position of stomach. No radio-opaque calculi or other significant radiographic abnormality are seen. IMPRESSION: No evidence of bowel obstruction or ileus. Distal tip of feeding tube seen in expected position of stomach. Electronically Signed   By: Marijo Conception, M.D.   On: 08/31/2017 12:12   Ct Head Wo Contrast  Result Date: 08/24/2017 CLINICAL DATA:  Altered level of consciousness, type II diabetes mellitus, hypertension, coronary artery disease, LEFT breast cancer EXAM: CT HEAD WITHOUT CONTRAST TECHNIQUE: Contiguous axial images were obtained from the base of the skull through the vertex without intravenous contrast. Sagittal and coronal MPR images reconstructed from axial data set. COMPARISON:  None FINDINGS: Brain: Normal ventricular morphology. No midline shift or mass effect. Normal appearance of brain parenchyma for age. No intracranial hemorrhage, mass lesion, or evidence of acute infarction. No extra-axial fluid collections. Vascular: Atherosclerotic calcification of internal carotid arteries at skull base. No hyperdense vessels. Skull: Intact Sinuses/Orbits: Clear Other: N/A IMPRESSION: No acute intracranial abnormalities. Electronically Signed   By: Lavonia Dana M.D.   On: 08/24/2017 20:35   Mr Brain Wo Contrast  Result Date: 09/01/2017 CLINICAL DATA:  Initial  evaluation for acute altered mental status, fever, tech bite. EXAM: MRI HEAD WITHOUT CONTRAST TECHNIQUE: Multiplanar, multiecho pulse sequences of the brain and surrounding structures were obtained without intravenous contrast. COMPARISON:  Prior CT from 08/14/2017. FINDINGS: Brain: Examination moderately degraded by motion artifact. No evidence for acute infarct. Gray-white matter differentiation maintained. Small remote lacunar infarct noted within the left caudate. No other discernible areas of chronic infarction. No acute or chronic intracranial hemorrhage. No appreciable mass lesion on this motion degraded exam. No mass effect or midline shift. No hydrocephalus. No extra-axial collection. Normal pituitary gland. Vascular: Major intracranial vascular flow voids maintained at the skull base. Skull and upper cervical spine: Craniocervical junction within normal limits. Upper cervical spine normal. No marrow replacing lesion. Scalp soft tissues unremarkable. Sinuses/Orbits: Globes and orbital soft tissues grossly within normal limits. Scattered mucosal thickening within the ethmoidal air cells and sphenoid sinuses. Fluid seen within the nasopharynx. Nasogastric tube in place. Bilateral mastoid effusions noted. Other: None. IMPRESSION: 1. Limited study due to extensive motion artifact. No definite acute intracranial abnormality. 2. 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 Electronically Signed   By: Jeannine Boga M.D.   On: 09/01/2017 15:37   US Abdomen Complete  Result Date: 08/28/2017 CLINICAL DATA:  Elevated liver function tests.  Acute renal failure. EXAM: ABDOMEN ULTRASOUND COMPLETE COMPARISON:  None. FINDINGS: Gallbladder: 1.6 cm gallstone within the neck. Nonmobile. No wall thickening or pericholecystic fluid. Sonographic Murphy's sign was not elicited. Common bile duct: Diameter: Normal, 4  mm. Liver: Moderately increased hepatic echogenicity. Presumed sparing adjacent to the gallbladder  and possibly within the caudate lobe. Portal vein is patent on color Doppler imaging with normal direction of blood flow towards the liver. IVC: No abnormality visualized. Pancreas: Pancreatic tail partially obscured by bowel gas. Spleen: Size and appearance within normal limits. Right Kidney: Length: 12.9 cm. Echogenicity within normal limits. No mass or hydronephrosis visualized. Left Kidney: Length: 12.3 cm. Echogenicity within normal limits. No mass or hydronephrosis visualized. Abdominal aorta: Atherosclerotic irregularity.  No aneurysm. Other findings: No ascites. Possible gastric distension, including on image 104. Suboptimally evaluated. IMPRESSION: 1. Increased echogenicity throughout the liver, suggesting steatosis. Areas of presumed pericholecystic fat sparing and possible caudate lobe fat sparing. Consider ultrasound follow-up at 6 months or further characterization with nonemergent outpatient pre and post contrast abdominal MRI. 2. Cholelithiasis with a stone in the gallbladder neck, immobile. No evidence of acute cholecystitis or biliary duct dilatation. 3.  Aortic Atherosclerosis (ICD10-I70.0). 4. Questionable gastric distension, suboptimally evaluated. Electronically Signed   By: Abigail Miyamoto M.D.   On: 08/28/2017 10:57   Dg Chest Port 1 View  Result Date: 09/07/2017 CLINICAL DATA:  PICC placement. EXAM: PORTABLE CHEST 1 VIEW COMPARISON:  09/02/2017. FINDINGS: Right PICC tip poorly visualized in the region of the confluence of the innominate veins. Stable left jugular double-lumen catheter. The endotracheal and nasogastric tubes have been removed. Stable enlarged cardiac silhouette. Increased prominence of the pulmonary vasculature and interstitial markings. No significant change in a small to moderate-sized left pleural effusion with left basilar patchy opacity. Mild increase in size of a small right pleural effusion with interval patchy opacity at the right lung base. Thoracic spine  degenerative changes. IMPRESSION: 1. Right PICC tip in the region of the origin of the superior vena cava. This would need to be advanced 7 cm to place it at the superior cavoatrial junction. 2. Progressive pulmonary vascular congestion with interval interstitial pulmonary edema with a probable of the lower component in the lower lung zones. 3. Stable left pleural effusion and left basilar pneumonia or patchy atelectasis. 4. Slight increase in size of a small right pleural effusion with interval mild right basilar alveolar edema, pneumonia or patchy atelectasis. Electronically Signed   By: Claudie Revering M.D.   On: 09/07/2017 16:46   Dg Chest Port 1 View  Result Date: 09/02/2017 CLINICAL DATA:  Respiratory failure EXAM: PORTABLE CHEST 1 VIEW COMPARISON:  Two days ago FINDINGS: Cardiomegaly and thickened hila, likely vascular. There is a left pleural effusion that is small to moderate. No Kerley lines or air bronchogram. Left IJ dialysis catheter with tip near the upper SVC. Right upper extremity PICC in good position. Endotracheal tube tip is between the clavicular heads and carina. An orogastric tube reaches the stomach. IMPRESSION: 1. Stable cardiomegaly with vascular congestion.Stable left pleural effusion. 2. Unremarkable positioning of hardware. Electronically Signed   By: Monte Fantasia M.D.   On: 09/02/2017 07:34   Dg Chest Port 1 View  Result Date: 08/31/2017 CLINICAL DATA:  Intubation, history breast cancer, coronary artery disease, diabetes mellitus, hypertension EXAM: PORTABLE CHEST 1 VIEW COMPARISON:  Portable exam 1709 hours compared to 0341 hours FINDINGS: Tip of endotracheal tube is at the carina recommend withdrawal 2.0-2.5 cm. Nasogastric tube extends into stomach. RIGHT arm PICC line tip projects over SVC. LEFT jugular central venous catheter tip projects over SVC. Numerous EKG leads project over chest. Enlargement of cardiac silhouette. Stable mediastinal contours and pulmonary vascularity.  Question minimal pulmonary  edema with bibasilar effusions and atelectasis. No pneumothorax. IMPRESSION: Probable bibasilar effusions and atelectasis. Tip of endotracheal tube is at the carina, recommend withdrawal 2.0-2.5 cm. Findings called to patient's nurse Tanali RN in ICU on 08/31/2017 at 1738 hrs. Electronically Signed   By: Lavonia Dana M.D.   On: 08/31/2017 17:39   Dg Chest Port 1 View  Result Date: 08/31/2017 CLINICAL DATA:  Respiratory failure. EXAM: PORTABLE CHEST 1 VIEW COMPARISON:  08/29/2017. FINDINGS: Dual-lumen left IJ catheter tip in upper SVC. Right PICC line tip in upper SVC. Cardiomegaly with diffuse interstitial prominence again noted. Bilateral pleural effusions again noted. Findings suggest CHF. Bilateral pneumonitis cannot be excluded. Low lung volumes with bibasilar atelectasis/infiltrates. No pneumothorax. IMPRESSION: 1. Dual-lumen left IJ catheter tip in upper SVC. Right PICC line tip in upper SVC. No pneumothorax. 2. Cardiomegaly with diffuse interstitial prominence bilateral pleural effusions suggesting CHF again noted. Bibasilar atelectasis/infiltrates again noted. Similar findings noted on prior exam. Electronically Signed   By: [place]   On: 08/31/2017 05:53   Dg Chest Port 1 View  Result Date: 08/29/2017 CLINICAL DATA:  Central line placement EXAM: PORTABLE CHEST 1 VIEW COMPARISON:  08/26/2017 FINDINGS: Right-sided PICC line is difficult to visualize centrally but likely similar. A left-sided internal jugular catheter terminates at the mid to low SVC. Midline trachea. Mild cardiomegaly. Atherosclerosis in the transverse aorta. Small bilateral pleural effusions, similar on the left and new or increased on the right. No pneumothorax. Moderate interstitial edema, increased. Left greater than right base airspace disease, similar. IMPRESSION: Left-sided central line with tip at mid to low SVC; no pneumothorax. Worsened aeration, with progressive interstitial edema and new  or enlarging right pleural effusion. A small left pleural effusion is similar. Bibasilar airspace disease which is most likely atelectasis. Concurrent infection or aspiration cannot be excluded. Electronically Signed   By: Abigail Miyamoto M.D.   On: 08/29/2017 14:28   Dg Chest Port 1 View  Result Date: 08/26/2017 CLINICAL DATA:  Respiratory failure. EXAM: PORTABLE CHEST 1 VIEW COMPARISON:  08/25/2017.  08/22/2017. FINDINGS: Right PICC line noted with tip noted over the superior vena cava. Stable cardiomegaly. Persistent left base infiltrate and left-sided pleural effusion noted. Questionable nodular density noted over the left apex. A PA and lateral chest x-ray suggested for further evaluation when the patient is clinically capable. No pneumothorax. Degenerative changes thoracic spine. IMPRESSION: 1.  Right PICC line noted with tip over superior vena cava. 2. Persistent left base infiltrate and left-sided pleural effusion. No interim change. 3. Questionable nodular density over the left pulmonary apex. A PA and lateral chest x-ray is suggested for further evaluation when the patient is clinically capable. 4.  Stable cardiomegaly. Electronically Signed   By: Marcello Moores  Register   On: 08/26/2017 05:57   Dg Chest Port 1 View  Result Date: 08/25/2017 CLINICAL DATA:  Patient admitted for sepsis of unknown cause 08/22/2017. Respiratory failure. EXAM: PORTABLE CHEST 1 VIEW COMPARISON:  Single view of the chest 08/22/2017. FINDINGS: Small pleural effusions, left greater than right, persist. There is left basilar airspace disease. Mild interstitial edema is seen. No pneumothorax. Heart size is upper normal. Aortic atherosclerosis. No acute bony abnormality. IMPRESSION: No notable change in small bilateral effusions, larger on the left. Left basilar airspace disease could be due to compressive atelectasis or pneumonia. Mild interstitial edema. Electronically Signed   By: Inge Rise M.D.   On: 08/25/2017 08:52   Dg  Chest Port 1 View  Result Date: 08/22/2017 CLINICAL DATA:  Acute onset of shortness of breath. EXAM: PORTABLE CHEST 1 VIEW COMPARISON:  Radiograph earlier this day. FINDINGS: Unchanged cardiomegaly and mediastinal contours. Development of pulmonary edema and small pleural effusions from prior exam. No confluent airspace disease. No pneumothorax. No acute osseous abnormalities. IMPRESSION: Development of pulmonary edema and small pleural effusions from exam earlier this day consistent with CHF. Electronically Signed   By: Jeb Levering M.D.   On: 08/22/2017 21:00   Dg Chest Port 1 View  Result Date: 08/22/2017 CLINICAL DATA:  Chills with unsteady gait.  Recent tick bite EXAM: PORTABLE CHEST 1 VIEW COMPARISON:  None. FINDINGS: There is no edema or consolidation. Lungs are mildly hyperexpanded. There is cardiomegaly. Pulmonary vascularity is normal. No adenopathy evident. There is an old healed fracture of the left clavicle. IMPRESSION: Lungs mildly hyperexpanded without edema or consolidation. There is cardiomegaly. No adenopathy evident. Electronically Signed   By: Lowella Grip III M.D.   On: 08/22/2017 13:21   [place] Ekg Site Rite  Result Date: 09/08/2017 If Site Rite image not attached, placement could not be confirmed due to current cardiac rhythm.  [place] Ekg Site Rite  Result Date: 08/25/2017 If Site Rite image not attached, placement could not be confirmed due to current cardiac rhythm.      Today   Subjective:   Ileana Roup patient doing much better slowly improving Objective:   Blood pressure (!) 168/66, pulse 61, temperature 98.1 F (36.7 C), temperature source Oral, resp. rate 16, height 5\' 3"  (1.6 m), weight 97 kg (213 lb 14.4 oz), SpO2 99 %.  .  Intake/Output Summary (Last 24 hours) at 09/15/2017 1101 Last data filed at 09/15/2017 1029 Gross per 24 hour  Intake 2070 ml  Output 600 ml  Net 1470 ml    Exam VITAL SIGNS: Blood pressure (!) 168/66, pulse 61, temperature 98.1 F  (36.7 C), temperature source Oral, resp. rate 16, height 5\' 3"  (1.6 m), weight 97 kg (213 lb 14.4 oz), SpO2 99 %.  GENERAL:  72 y.o.-year-old patient lying in the bed with no acute distress.  EYES: Pupils equal, round, reactive to light and accommodation. No scleral icterus. Extraocular muscles intact.  HEENT: Head atraumatic, normocephalic. Oropharynx and nasopharynx clear.  NECK:  Supple, no jugular venous distention. No thyroid enlargement, no tenderness.  LUNGS: Normal breath sounds bilaterally, no wheezing, rales,rhonchi or crepitation. No use of accessory muscles of respiration.  CARDIOVASCULAR: S1, S2 normal. No murmurs, rubs, or gallops.  ABDOMEN: Soft, nontender, nondistended. Bowel sounds present. No organomegaly or mass.  EXTREMITIES: 2+ pedal edema, cyanosis, or clubbing.  NEUROLOGIC: Cranial nerves II through XII are intact. Muscle strength 5/5 in all extremities. Sensation intact. Gait not checked.  PSYCHIATRIC: The patient is alert and oriented x 3.  SKIN: No obvious rash, lesion, or ulcer.   Data Review     CBC w Diff:  Lab Results  Component Value Date   WBC 7.4 09/13/2017   HGB 9.3 (L) 09/13/2017   HCT 27.5 (L) 09/13/2017   PLT 180 09/13/2017   LYMPHOPCT 11 09/08/2017   MONOPCT 16 09/08/2017   EOSPCT 2 09/08/2017   BASOPCT 1 09/08/2017   CMP:  Lab Results  Component Value Date   NA 140 09/15/2017   K 3.8 09/15/2017   CL 105 09/15/2017   CO2 27 09/15/2017   BUN 58 (H) 09/15/2017   CREATININE 5.25 (H) 09/15/2017   PROT 6.7 09/01/2017   ALBUMIN 2.7 (L) 09/14/2017   BILITOT 0.8 09/01/2017  ALKPHOS 110 09/01/2017   AST 40 09/01/2017   ALT 8 (L) 09/01/2017  .  Micro Results No results found for this or any previous visit (from the past 240 hour(s)).      Code Status Orders  (From admission, onward)        Start     Ordered   08/22/17 1612  Full code  Continuous     08/22/17 1611    Code Status History    This patient has a current code  status but no historical code status.          Contact information for after-discharge care    Destination    [facility] .   Service:  Skilled Nursing Contact information: [address] [place] [phone]              Discharge Medications   Allergies as of 09/15/2017      Reactions   Ace Inhibitors Cough      Medication List    STOP taking these medications   CINNAMON PLUS CHROMIUM PO   Co Q10 100 MG Caps   FLAX SEEDS PO   glipiZIDE 5 MG 24 hr tablet Commonly known as:  GLUCOTROL XL   losartan 50 MG tablet Commonly known as:  COZAAR   Magnesium 400 MG Caps   metFORMIN 1000 MG tablet Commonly known as:  GLUCOPHAGE   pravastatin 80 MG tablet Commonly known as:  PRAVACHOL   SF 1.1 % Gel dental gel Generic drug:  sodium fluoride   silver sulfADIAZINE 1 % cream Commonly known as:  SILVADENE     TAKE these medications   acetaminophen 325 MG tablet Commonly known as:  TYLENOL Place 2 tablets (650 mg total) into feeding tube every 6 (six) hours as needed for mild pain (or Fever >/= 101).   amiodarone 200 MG tablet Commonly known as:  PACERONE Take 1 tablet (200 mg total) by mouth 2 (two) times daily.   aspirin EC 81 MG tablet Take 81 mg by mouth daily.   atorvastatin 80 MG tablet Commonly known as:  LIPITOR Take 1 tablet by mouth daily.   chlorhexidine 0.12 % solution Commonly known as:  PERIDEX 15 mLs by Mouth Rinse route 2 (two) times daily.   clopidogrel 75 MG tablet Commonly known as:  PLAVIX TAKE 1 TABLET BY MOUTH ONCE DAILY   insulin aspart 100 UNIT/ML injection Commonly known as:  novoLOG Inject 0-5 Units into the skin at bedtime. PLEASE USE LOW DOSE SSI AT snf   insulin glargine 100 UNIT/ML injection Commonly known as:  LANTUS Inject 0.1 mLs (10 Units total) into the skin daily. Start taking on:  09/16/2017   ipratropium-albuterol 0.5-2.5 (3) MG/3ML Soln Commonly known as:   DUONEB Take 3 mLs by nebulization every 6 (six) hours as needed.   letrozole 2.5 MG tablet Commonly known as:  FEMARA Take 1 tablet (2.5 mg total) by mouth daily.   Melatonin 5 MG Tabs Take 2 tablets (10 mg total) by mouth at bedtime.   metoprolol succinate 50 MG 24 hr tablet Commonly known as:  TOPROL-XL Take 50 mg by mouth daily.   multivitamin Tabs tablet Take 1 tablet by mouth at bedtime.   nystatin cream Commonly known as:  MYCOSTATIN Apply topically 2 (two) times daily.   pantoprazole 40 MG tablet Commonly known as:  PROTONIX Take 1 tablet (40 mg total) by mouth daily before breakfast. Start taking on:  09/16/2017  Vitamin D-3 5000 units Tabs Take 1 tablet by mouth daily.          Total Time in preparing paper work, data evaluation and todays exam - 57 minutes  Dustin Flock M.D on 09/15/2017 at [place]  [phone]

## 2017-09-15 NOTE — Progress Notes (Signed)
Met with pt earlier this AM.  Discussed with pt that she will not be able to take the Glipizide and Metformin after d/c.  Metformin contraindicated with renal failure and Glipizide poses high risk for Hypoglycemia with renal failure.  Patient stated she is OK going home on insulin for now.  Will be in SNF for Rehab for about a week or so.  Told pt that the RNs in the SNF will give her the insulin, but would like for pt to practice insulin prior to d/c just in case the MDs continue her insulin after she leaves the SNF for home.  Educated patient on insulin pen use at home.  Reviewed all steps of insulin pen including attachment of needle, 2-unit air shot, dialing up dose, giving injection, rotation of injection sites, removing needle, disposal of sharps, storage of unused insulin, disposal of insulin etc.  Patient able to provide successful return demonstration.  Reviewed troubleshooting with insulin pen.  Also reviewed Signs/Symptoms of Hypoglycemia with patient and how to treat Hypoglycemia at home.    Patient interested in seeing what other oral medicines are available and safe to use with kidney disease, however, she is willing to use insulin for now.    --Will follow patient during hospitalization--  Wyn Quaker RN, MSN, CDE Diabetes Coordinator Inpatient Glycemic Control Team Team Pager: [phone] (8a-5p)

## 2017-09-15 NOTE — Plan of Care (Signed)
  Problem: Education: Goal: Knowledge of General Education information will improve Outcome: Progressing   Problem: Fluid Volume: Goal: Hemodynamic stability will improve Outcome: Progressing   Problem: Clinical Measurements: Goal: Diagnostic test results will improve Outcome: Progressing Goal: Signs and symptoms of infection will decrease Outcome: Progressing   Problem: Respiratory: Goal: Ability to maintain adequate ventilation will improve Outcome: Progressing  [facility]

## 2017-09-15 NOTE — Progress Notes (Signed)
Inpatient Diabetes Program Recommendations  AACE/ADA: New Consensus Statement on Inpatient Glycemic Control (2015)  Target Ranges:  Prepandial:   less than 140 mg/dL      [facility] postprandial:   less than 180 mg/dL (1-2 hours)      Critically ill patients:  140 - 180 mg/dL   Results for Bonnie Holt, Bonnie Holt (MRN [id number]) as of 09/15/2017 10:50  Ref. Range 09/14/2017 07:51 09/14/2017 11:46 09/14/2017 17:01 09/14/2017 21:04  Glucose-Capillary Latest Ref Range: 70 - 99 mg/dL 173 (H)  2 units NOVOLOG +  10 units LANTUS at 9am  279 (H)  5 units NOVOLOG  194 (H)  2 units NOVOLOG  222 (H)  2 units NOVOLOG    Results for Bonnie Holt, Bonnie Holt (MRN [id number]) as of 09/15/2017 10:50  Ref. Range 09/15/2017 07:51  Glucose-Capillary Latest Ref Range: 70 - 99 mg/dL 230 (H)  3 units NOVOLOG +  10 units LANTUS    Home DM Meds: Metformin 1000 mg BID       Glipizide 5 mg daily  Current Insulin Orders: Lantus 10 units daily      Novolog Sensitive Correction Scale/ SSI (0-9 units) TID AC + HS     MD- Please consider the following in-hospital insulin adjustments:  1. Increase Lantus to 12 units daily  2. Start Low dose Novolog Meal Coverage: Novolog 3 units TID with meals (Please add the following Hold Parameters: Hold if pt eats <50% of meal, Hold if pt NPO)  3. Do NOT recommend patient continuing Metformin at time of discharge given elevated creatinine.    4. Will patient need insulin at time of discharge?        --Will follow patient during hospitalization--  Wyn Quaker RN, MSN, CDE Diabetes Coordinator Inpatient Glycemic Control Team Team Pager: [phone] (8a-5p)

## 2017-09-15 NOTE — Care Management Important Message (Signed)
Important Message  Patient Details  Name: Bonnie Holt MRN: [id number] Date of Birth: [birth year]   Medicare Important Message Given:  Yes    Juliann Pulse A Yanely Mast 09/15/2017, 10:50 AM

## 2017-09-15 NOTE — Progress Notes (Signed)
[facility]  ROUNDING NOTE   Subjective:   Husband at bedside.  Patient is doing better.  Asking about going home.  She is able to eat and drink without nausea or vomiting Continues to have large amount of edema UOP remains good S Cr 5.67->  5.25  Objective:  Vital signs in last 24 hours:  Temp:  [98.1 F (36.7 C)-98.2 F (36.8 C)] 98.1 F (36.7 C) (07/03 0436) Pulse Rate:  [61-68] 61 (07/03 0436) Resp:  [16-18] 16 (07/02 2112) BP: (130-168)/(51-66) 168/66 (07/03 0436) SpO2:  [97 %-100 %] 99 % (07/03 0436) Weight:  [97 kg (213 lb 14.4 oz)] 97 kg (213 lb 14.4 oz) (07/03 0436)  Weight change: -10.8 kg (-23 lb 11.2 oz) Filed Weights   09/12/17 0316 09/14/17 0510 09/15/17 0436  Weight: 108.3 kg (238 lb 11.2 oz) 107.8 kg (237 lb 9.6 oz) 97 kg (213 lb 14.4 oz)    Intake/Output: I/O last 3 completed shifts: In: 2070 [P.O.:720; Other:1350] Out: 1876 [VPXTG:6269; Stool:1]   Intake/Output this shift:  No intake/output data recorded.  Physical Exam: General:  sitting up in recliner chair  Head: [place]/AT  Eyes: Anicteric, moist oral mucus membranes  Neck: Supple,   Lungs:  clear  Heart: Irregular rhythm  Abdomen:  Obese, nontender  Extremities: 2+ peripheral edema.  Neurologic: Nonfocal, moving all four extremities  Skin: Pink rash over lower abdomen and legs b/l-improving, b/l LE redness (congestion)  Access: RIJ permcath, foley    Basic Metabolic Panel: Recent Labs  Lab 09/10/17 0828 09/11/17 0459 09/12/17 1140 09/13/17 0557 09/14/17 1015 09/15/17 0535  NA 139 137 139 141 140  141 140  K 3.5 3.3* 3.5 3.3* 3.5  3.5 3.8  CL 102 101 102 104 103  104 105  CO2 27 27 27 28 25  26 27   GLUCOSE 128* 147* 224* 187* 269*  269* 198*  BUN 26* 32* 47* 54* 58*  56* 58*  CREATININE 4.07* 4.91* 5.07* 5.38* 5.67*  5.53* 5.25*  CALCIUM 8.6* 8.5* 8.8* 8.8* 9.0  9.1 9.0  PHOS 3.5 3.8 3.4 3.8 4.3  --     Liver Function Tests: Recent Labs  Lab 09/10/17 0828  09/11/17 0459 09/12/17 1140 09/13/17 0557 09/14/17 1015  ALBUMIN 2.5* 2.4* 2.6* 2.5* 2.7*   No results for input(s): LIPASE, AMYLASE in the last 168 hours. No results for input(s): AMMONIA in the last 168 hours.  CBC: Recent Labs  Lab 09/09/17 1136 09/13/17 0557  WBC 5.8 7.4  HGB 9.6* 9.3*  HCT 28.5* 27.5*  MCV 92.3 91.3  PLT 163 180    Cardiac Enzymes: No results for input(s): CKTOTAL, CKMB, CKMBINDEX, TROPONINI in the last 168 hours.  BNP: Invalid input(s): POCBNP  CBG: Recent Labs  Lab 09/14/17 0751 09/14/17 1146 09/14/17 1701 09/14/17 2104 09/15/17 0751  GLUCAP 173* 279* 194* 222* 230*    Microbiology: Results for orders placed or performed during the hospital encounter of 08/22/17  Blood Culture (routine x 2)     Status: None   Collection Time: 08/22/17 12:52 PM  Result Value Ref Range Status   Specimen Description BLOOD RIGHT HAND  Final   Special Requests   Final    BOTTLES DRAWN AEROBIC AND ANAEROBIC Blood Culture adequate volume   Culture   Final    NO GROWTH 5 DAYS Performed at [facility], [address] [place], [place] [address]    Report Status 08/27/2017 FINAL  Final  Urine culture  Status: Abnormal   Collection Time: 08/22/17 12:52 PM  Result Value Ref Range Status   Specimen Description   Final    URINE, RANDOM Performed at [facility], [address] [place], [place] [address]    Special Requests   Final    NONE Performed at [facility], [address] [place], [place] [address]    Culture (A)  Final    >=100,000 COLONIES/mL KLEBSIELLA PNEUMONIAE 60,000 COLONIES/mL PROTEUS MIRABILIS    Report Status 08/24/2017 FINAL  Final   Organism ID, Bacteria KLEBSIELLA PNEUMONIAE (A)  Final   Organism ID, Bacteria PROTEUS MIRABILIS (A)  Final      Susceptibility   Klebsiella pneumoniae - MIC*    AMPICILLIN RESISTANT Resistant     CEFAZOLIN <=4 SENSITIVE Sensitive     CEFTRIAXONE <=1 SENSITIVE  Sensitive     CIPROFLOXACIN <=0.25 SENSITIVE Sensitive     GENTAMICIN <=1 SENSITIVE Sensitive     IMIPENEM <=0.25 SENSITIVE Sensitive     NITROFURANTOIN 64 INTERMEDIATE Intermediate     TRIMETH/SULFA <=20 SENSITIVE Sensitive     AMPICILLIN/SULBACTAM 4 SENSITIVE Sensitive     PIP/TAZO <=4 SENSITIVE Sensitive     Extended ESBL NEGATIVE Sensitive     * >=100,000 COLONIES/mL KLEBSIELLA PNEUMONIAE   Proteus mirabilis - MIC*    AMPICILLIN <=2 SENSITIVE Sensitive     CEFAZOLIN <=4 SENSITIVE Sensitive     CEFTRIAXONE <=1 SENSITIVE Sensitive     CIPROFLOXACIN <=0.25 SENSITIVE Sensitive     GENTAMICIN <=1 SENSITIVE Sensitive     IMIPENEM 1 SENSITIVE Sensitive     NITROFURANTOIN 128 RESISTANT Resistant     TRIMETH/SULFA <=20 SENSITIVE Sensitive     AMPICILLIN/SULBACTAM <=2 SENSITIVE Sensitive     PIP/TAZO <=4 SENSITIVE Sensitive     * 60,000 COLONIES/mL PROTEUS MIRABILIS  Blood Culture (routine x 2)     Status: None   Collection Time: 08/22/17 12:53 PM  Result Value Ref Range Status   Specimen Description BLOOD RIGHT ARM  Final   Special Requests   Final    BOTTLES DRAWN AEROBIC AND ANAEROBIC Blood Culture results may not be optimal due to an excessive volume of blood received in culture bottles   Culture   Final    NO GROWTH 5 DAYS Performed at [facility], [address] [place], [place] [address]    Report Status 08/27/2017 FINAL  Final  MRSA PCR Screening     Status: None   Collection Time: 08/22/17  9:18 PM  Result Value Ref Range Status   MRSA by PCR NEGATIVE NEGATIVE Final    Comment:        The GeneXpert MRSA Assay (FDA approved for NASAL specimens only), is one component of a comprehensive MRSA colonization surveillance program. It is not intended to diagnose MRSA infection nor to guide or monitor treatment for MRSA infections. Performed at [facility], [address] [place], [place] [address]   Culture, blood (Routine X 2) w Reflex to ID Panel      Status: None   Collection Time: 08/29/17  9:31 PM  Result Value Ref Range Status   Specimen Description BLOOD LEFT FOOT  Final   Special Requests   Final    BOTTLES DRAWN AEROBIC AND ANAEROBIC Blood Culture adequate volume   Culture   Final    NO GROWTH 5 DAYS Performed at [facility], [address] [place], [place] [address]    Report Status 09/03/2017 FINAL  Final  Culture, blood (Routine X 2) w Reflex to ID Panel     Status: None   Collection Time: 08/29/17  9:50 PM  Result Value Ref Range Status   Specimen Description BLOOD RIGHT ANKLE  Final   Special Requests   Final    BOTTLES DRAWN AEROBIC AND ANAEROBIC Blood Culture adequate volume   Culture   Final    NO GROWTH 5 DAYS Performed at [facility], [address], [place], [place] [address]    Report Status 09/03/2017 FINAL  Final  C difficile quick scan w PCR reflex     Status: None   Collection Time: 09/02/17 12:31 PM  Result Value Ref Range Status   C Diff antigen NEGATIVE NEGATIVE Final   C Diff toxin NEGATIVE NEGATIVE Final   C Diff interpretation No C. difficile detected.  Final    Comment: Performed at [facility], [address] [place], [place] [address]  CULTURE, BLOOD (ROUTINE X 2) w Reflex to ID Panel     Status: None   Collection Time: 09/02/17  7:25 PM  Result Value Ref Range Status   Specimen Description BLOOD BLOOD RIGHT HAND  Final   Special Requests   Final    BOTTLES DRAWN AEROBIC AND ANAEROBIC Blood Culture adequate volume   Culture   Final    NO GROWTH 5 DAYS Performed at [facility], [address] [place], [place] [address]    Report Status 09/07/2017 FINAL  Final  CULTURE, BLOOD (ROUTINE X 2) w Reflex to ID Panel     Status: None   Collection Time: 09/02/17  7:25 PM  Result Value Ref Range Status   Specimen Description BLOOD BLOOD RIGHT HAND  Final   Special Requests   Final    BOTTLES DRAWN AEROBIC AND ANAEROBIC Blood Culture results may not  be optimal due to an inadequate volume of blood received in culture bottles   Culture   Final    NO GROWTH 5 DAYS Performed at [facility], [address], [place], [place] [address]    Report Status 09/07/2017 FINAL  Final  Urine Culture     Status: Abnormal   Collection Time: 09/03/17  4:23 AM  Result Value Ref Range Status   Specimen Description   Final    URINE, RANDOM Performed at [facility], [address], [place], [place] [address]    Special Requests   Final    NONE Performed at [facility], [address] [place], [place] [address]    Culture (A)  Final    >=100,000 COLONIES/mL DIPHTHEROIDS(CORYNEBACTERIUM SPECIES) Standardized susceptibility testing for this organism is not available. Performed at [facility], [place] [address], [place], [place] [address]    Report Status 09/04/2017 FINAL  Final    Coagulation Studies: No results for input(s): LABPROT, INR in the last 72 hours.  Urinalysis: No results for input(s): COLORURINE, LABSPEC, PHURINE, GLUCOSEU, HGBUR, BILIRUBINUR, KETONESUR, PROTEINUR, UROBILINOGEN, NITRITE, LEUKOCYTESUR in the last 72 hours.  Invalid input(s): APPERANCEUR    Imaging: No results found.   Medications:    . amiodarone  200 mg Oral BID  . atorvastatin  80 mg Oral q1800  . chlorhexidine  15 mL Mouth Rinse BID  . cholecalciferol  5,000 Units Oral Daily  . insulin aspart  0-5 Units Subcutaneous QHS  . insulin aspart  0-9 Units Subcutaneous TID WC  . insulin glargine  10 Units Subcutaneous Daily  . letrozole  2.5 mg Oral Daily  . mouth rinse  15 mL Mouth Rinse q12n4p  . Melatonin  10 mg Oral QHS  . metoprolol succinate  50 mg Oral Daily  . multivitamin  1 tablet Oral QHS  . multivitamin with minerals  1 tablet Oral Daily  . nystatin cream   Topical BID  . pantoprazole  40 mg Oral QAC breakfast  . sodium chloride flush  10-40 mL Intracatheter Q12H   acetaminophen **OR** acetaminophen,  bisacodyl, ipratropium-albuterol, metoprolol tartrate, [DISCONTINUED] ondansetron **OR** ondansetron (ZOFRAN) IV, sodium chloride flush  Assessment/ Plan:  [name] is a 72 y.o. white female with diabetes mellitus type II, coronary artery disease, obstructive sleep apnea, history of breast cancer, was admitted on6/9/2019with fever, chills, tick bite (left groin and back)  Brief hospital course: Admitted on 08/22/2017 for sepsis and urinary tract infection. There was concern for tick borne illness. Urine grew corynebacterium species. Patient required hemodialysis on 6/16 first treatment. Last hemodialysis treatment was 6/27. Permcath placed on 6/27. Since then has not required hemodialysis.   1. Acute renal failure on chronic kidney disease stage III Baseline creatinine of 1.1, GFR of 49 on 06/01/17  UOP has  Improved. No uremic symptoms. Electrolytes and volume status are acceptable. No acute indication of HD at present - Remove foley catheter - BUN/Cr is expected to improve now that urine output is significantly better Plan to check BMP at nursing home and fax results to our office. We will follow up on July 11 at 11.30 am at our office If S Creatinine continues to improve, we will get permcath removed next week Patient instructed to keep it dry  2. Sepsis with Urinary tract infection and recent tick bite (concern for tick borne illness). Urine culture with corynebacterium species. Completed course of antibiotics.   3. Anemia with renal failure: now with peptic ulcer disease/GIB and acute blood loss. Status post endoscopy with clipping of gastric vessels by Dr. Allen Norris on 6/25  Status post 2 units PRBC transfusion 6/25.   4. Generalized edema - good diuresis without diuretics - avoid hypotension  5. Hypokalemia - replace PRN   LOS: 24 [place] [place] 7/3/201910:13 AM

## 2017-09-15 NOTE — Progress Notes (Signed)
Discharged at this time to [facility] no c/o

## 2017-09-15 NOTE — Progress Notes (Signed)
Pt for discharge to [facility] via [facility]s. They were called earlier.  [facility] at [facility] pts picc line d/cd earlier  Per policy without difficulty pressure applied  To rt upper arm site  And dressing d/i.  Foley cath d/cd per policy and  Pt tol well. voided  45 min post removal of catheter.. 02 2l [place] in place. No resp distress noted. Pt up in chair.husband at bedside . Discharge instructions  Discussed with pt and spouse / meds , diet , activity and f/u discussed.  Waiting for ems to transport pt.

## 2017-09-17 ENCOUNTER — Other Ambulatory Visit: Payer: Self-pay | Admitting: *Deleted

## 2017-09-17 NOTE — Patient Outreach (Signed)
[place] [facility]) Care '  09/17/2017  NICOLAS SISLER 1945-09-06 [id number]   Post acute Consult . This Research officer, political party met with patient at [employer]. Patient admitted to [facility] following a hospitalization for Severe Sepsis. [facility] care management program re-visited. Per patient, she will be discharging on Wednesday, 09/22/17 with [facility] through the [facility] to Home program. Per patient, she has a follow up appointment with Dr. Posey Pronto next week and does not feel that she will have to continue dialysis.  Patient resides with her spouse and describes a positive network  of family and friends. Patient's spouse provides transportation to all of her medical appointments. Patient reports actively participating in therapy to get well enough to take care of herself and her family again. Patient identified no barriers to discharge at this time.  Plan: This social worker will inform [place] of patient's discharge on 09/22/17. Patient will assess for social work needs post discharge from [employer].   Sheralyn Boatman [facility] Care Management [phone]

## 2017-09-19 DIAGNOSIS — I10 Essential (primary) hypertension: Secondary | ICD-10-CM | POA: Diagnosis not present

## 2017-09-19 DIAGNOSIS — E119 Type 2 diabetes mellitus without complications: Secondary | ICD-10-CM | POA: Diagnosis not present

## 2017-09-19 DIAGNOSIS — Z8719 Personal history of other diseases of the digestive system: Secondary | ICD-10-CM | POA: Diagnosis not present

## 2017-09-19 DIAGNOSIS — I4891 Unspecified atrial fibrillation: Secondary | ICD-10-CM | POA: Diagnosis not present

## 2017-09-19 DIAGNOSIS — Z853 Personal history of malignant neoplasm of breast: Secondary | ICD-10-CM | POA: Diagnosis not present

## 2017-09-19 DIAGNOSIS — A419 Sepsis, unspecified organism: Secondary | ICD-10-CM | POA: Diagnosis not present

## 2017-09-19 DIAGNOSIS — D649 Anemia, unspecified: Secondary | ICD-10-CM | POA: Diagnosis not present

## 2017-09-20 ENCOUNTER — Other Ambulatory Visit: Payer: Self-pay | Admitting: *Deleted

## 2017-09-20 NOTE — Patient Outreach (Addendum)
[place] [facility]) Care Management  09/20/2017  Bonnie Holt [birth year] [id number]   Phone call from patient's spouse to discuss [facility] involvement. Per patient's spouse, patient was confused by the various visits while in rehab. This social worker explained [facility] care management and role of this social worker in the rehab. Patient to be discharged on 09/22/17 with [facility] through the Advanced you to home program.    Elliot Gurney, [place] Care Management [phone]

## 2017-09-23 ENCOUNTER — Other Ambulatory Visit: Payer: Self-pay

## 2017-09-23 DIAGNOSIS — R600 Localized edema: Secondary | ICD-10-CM | POA: Diagnosis not present

## 2017-09-23 DIAGNOSIS — N179 Acute kidney failure, unspecified: Secondary | ICD-10-CM | POA: Diagnosis not present

## 2017-09-23 DIAGNOSIS — I10 Essential (primary) hypertension: Secondary | ICD-10-CM | POA: Diagnosis not present

## 2017-09-23 NOTE — Patient Outreach (Signed)
[place] [facility]) Care Management  09/23/2017  Bonnie Holt 07/18/1945 [id number]   TOC Outreach:  Unsuccessful outreach attempt to the above patient/husband. RN CM returning husbands call as he requested during earlier telephone conversation today. Unable to leave message as voice mailbox has not been set up.  Plan: Send unsuccessful outreach letter. Follow-up call within 3-4 business days.  Sopheap Boehle E. Rollene Rotunda RN, BSN [facility] Care Management Coordinator [phone]

## 2017-09-23 NOTE — Patient Outreach (Signed)
[place] [facility]) Care Management Initial TOC Call  09/23/2017  SHANIQWA HORSMAN [birth year] [id number]   Patient outreach: Unsuccessful outreach call to the above patient. Call answered by patients husband, Lounette Sloan, who gave verbal consent for [place] on 09/09/17 while patient was hospitalized for Sepsis at [facility]. Mr. Menendez states patient was in the shower. His tone was abrasive and asked RN CM "Just what do you want" when RN CM attempted to introduce herself. Once he allowed RN CM to speak, he stated that patient had "just come home from the nursing home yesterday and has lots of doctors visits and her medications are a mess". RN CM tried to explain that she could help with the medications and refer to pharmacy and husband stated "well you need to come out soon". Mr. Calloway did not have the time to answer any TOC questions or schedule home visit as he needed to help patient out of the shower. Requested RN CM call back today after 2. RN CM shared with Mr. Tufaro that she has a previous appointment with another patient this pm and may not be about to call until tomorrow morning. Husband was insistent that RN CM call back today after 2pm.  Plan: Follow-up phone call within 24 hours  Katurah Karapetian E. Rollene Rotunda RN, BSN [facility] Care Management Coordinator [phone]

## 2017-09-24 ENCOUNTER — Other Ambulatory Visit: Payer: Self-pay

## 2017-09-24 DIAGNOSIS — N390 Urinary tract infection, site not specified: Secondary | ICD-10-CM | POA: Diagnosis not present

## 2017-09-24 DIAGNOSIS — I509 Heart failure, unspecified: Secondary | ICD-10-CM | POA: Diagnosis not present

## 2017-09-24 DIAGNOSIS — Z6841 Body Mass Index (BMI) 40.0 and over, adult: Secondary | ICD-10-CM | POA: Diagnosis not present

## 2017-09-24 DIAGNOSIS — I110 Hypertensive heart disease with heart failure: Secondary | ICD-10-CM | POA: Diagnosis not present

## 2017-09-24 DIAGNOSIS — B961 Klebsiella pneumoniae [K. pneumoniae] as the cause of diseases classified elsewhere: Secondary | ICD-10-CM | POA: Diagnosis not present

## 2017-09-24 DIAGNOSIS — Z955 Presence of coronary angioplasty implant and graft: Secondary | ICD-10-CM | POA: Diagnosis not present

## 2017-09-24 DIAGNOSIS — E119 Type 2 diabetes mellitus without complications: Secondary | ICD-10-CM | POA: Diagnosis not present

## 2017-09-24 DIAGNOSIS — Z7984 Long term (current) use of oral hypoglycemic drugs: Secondary | ICD-10-CM | POA: Diagnosis not present

## 2017-09-24 DIAGNOSIS — I2510 Atherosclerotic heart disease of native coronary artery without angina pectoris: Secondary | ICD-10-CM | POA: Diagnosis not present

## 2017-09-24 DIAGNOSIS — G4733 Obstructive sleep apnea (adult) (pediatric): Secondary | ICD-10-CM | POA: Diagnosis not present

## 2017-09-24 DIAGNOSIS — Z853 Personal history of malignant neoplasm of breast: Secondary | ICD-10-CM | POA: Diagnosis not present

## 2017-09-24 NOTE — Patient Outreach (Signed)
[place] [facility]) Care Management  09/24/2017  Bonnie Holt 09/07/1945 [id number]   Unsuccessful outreach attempt by [facility] RN CM to the above patient. "Mail box is full and unable to leave message at this time.". Unsuccessful outreach letter sent yesterday with failed contact.  Plan: Follow up phone call within 3-4 business days and if no response or unsuccessful contact in 10 business days will close case.  Redith Drach E. Rollene Rotunda RN, BSN [facility] Care Management Coordinator [phone]

## 2017-09-27 DIAGNOSIS — G4733 Obstructive sleep apnea (adult) (pediatric): Secondary | ICD-10-CM | POA: Diagnosis not present

## 2017-09-27 DIAGNOSIS — I10 Essential (primary) hypertension: Secondary | ICD-10-CM | POA: Diagnosis not present

## 2017-09-28 DIAGNOSIS — I25119 Atherosclerotic heart disease of native coronary artery with unspecified angina pectoris: Secondary | ICD-10-CM | POA: Diagnosis not present

## 2017-09-28 DIAGNOSIS — E119 Type 2 diabetes mellitus without complications: Secondary | ICD-10-CM | POA: Diagnosis not present

## 2017-09-29 DIAGNOSIS — E119 Type 2 diabetes mellitus without complications: Secondary | ICD-10-CM | POA: Diagnosis not present

## 2017-09-29 DIAGNOSIS — M1990 Unspecified osteoarthritis, unspecified site: Secondary | ICD-10-CM | POA: Diagnosis not present

## 2017-09-29 DIAGNOSIS — R0602 Shortness of breath: Secondary | ICD-10-CM | POA: Diagnosis not present

## 2017-09-29 DIAGNOSIS — R9431 Abnormal electrocardiogram [ECG] [EKG]: Secondary | ICD-10-CM | POA: Diagnosis not present

## 2017-09-29 DIAGNOSIS — I10 Essential (primary) hypertension: Secondary | ICD-10-CM | POA: Diagnosis not present

## 2017-09-29 DIAGNOSIS — I2510 Atherosclerotic heart disease of native coronary artery without angina pectoris: Secondary | ICD-10-CM | POA: Diagnosis not present

## 2017-09-29 DIAGNOSIS — Z955 Presence of coronary angioplasty implant and graft: Secondary | ICD-10-CM | POA: Diagnosis not present

## 2017-09-29 DIAGNOSIS — E782 Mixed hyperlipidemia: Secondary | ICD-10-CM | POA: Diagnosis not present

## 2017-09-29 DIAGNOSIS — I208 Other forms of angina pectoris: Secondary | ICD-10-CM | POA: Diagnosis not present

## 2017-09-30 ENCOUNTER — Other Ambulatory Visit: Payer: Self-pay

## 2017-09-30 ENCOUNTER — Ambulatory Visit
Admission: RE | Admit: 2017-09-30 | Discharge: 2017-09-30 | Disposition: A | Discharge status: Home / Self Care | Payer: PPO | Source: Ambulatory Visit | Attending: Radiation Oncology | Admitting: Radiation Oncology

## 2017-09-30 VITALS — BP 139/69 | HR 72 | Temp 96.9°F | Resp 20 | Wt 209.4 lb

## 2017-09-30 DIAGNOSIS — Z170 Estrogen receptor positive status [ER+]: Principal | ICD-10-CM

## 2017-09-30 DIAGNOSIS — Z79811 Long term (current) use of aromatase inhibitors: Secondary | ICD-10-CM | POA: Diagnosis not present

## 2017-09-30 DIAGNOSIS — C50412 Malignant neoplasm of upper-outer quadrant of left female breast: Secondary | ICD-10-CM

## 2017-09-30 NOTE — Progress Notes (Signed)
Radiation Oncology Follow up Note  Name: Bonnie Holt   Date:   09/30/2017 MRN:  [id number] DOB: 1945/08/13    This 72 y.o. female presents to the clinic today for 6 month follow-up status post whole breast radiation to her left breast for stage I ER/PR positive invasive mammary carcinoma.  REFERRING PROVIDER: Kirk Ruths, MD  HPI: patient is a 72 year old female now out 6 months having completed whole breast radiation to her left breast for stage I ER/PR positive invasive mammary carcinoma status post wide local excision. She is seen today in routine follow-up and is doing well. She specifically denies breast tenderness cough or bone pain..she has mammogram scheduled for next month.she's currently on Femara tolerating that well without side effect. Unfortunately patient was in hospital for a long admission with unknown illness possibly related to a tick bite. She is completely recovered at this time.  COMPLICATIONS OF TREATMENT: none  FOLLOW UP COMPLIANCE: keeps appointments   PHYSICAL EXAM:  BP 139/69   Pulse 72   Temp (!) 96.9 F (36.1 C) (Tympanic)   Resp 20   Wt 209 lb 7 oz (95 kg)   BMI 37.10 kg/m  Lungs are clear to A&P cardiac examination essentially unremarkable with regular rate and rhythm. No dominant mass or nodularity is noted in either breast in 2 positions examined. Incision is well-healed. No axillary or supraclavicular adenopathy is appreciated. Cosmetic result is excellent. Well-developed well-nourished patient in NAD. HEENT reveals PERLA, EOMI, discs not visualized.  Oral cavity is clear. No oral mucosal lesions are identified. Neck is clear without evidence of cervical or supraclavicular adenopathy. Lungs are clear to A&P. Cardiac examination is essentially unremarkable with regular rate and rhythm without murmur rub or thrill. Abdomen is benign with no organomegaly or masses noted. Motor sensory and DTR levels are equal and symmetric in the upper and lower  extremities. Cranial nerves II through XII are grossly intact. Proprioception is intact. No peripheral adenopathy or edema is identified. No motor or sensory levels are noted. Crude visual fields are within normal range.  RADIOLOGY RESULTS: no current films for review  PLAN: at the present time she is doing well with no evidence of disease. I'm please were overall progress. She continues on Femara without side effect. I've asked to see her back in 6 months and then will go to once your follow-up visits. Patient knows to call with any concerns.  I would like to take this opportunity to thank you for allowing me to participate in the care of your patient.Noreene Filbert, MD

## 2017-09-30 NOTE — Progress Notes (Signed)
Patient denies any concerns today.  

## 2017-09-30 NOTE — Patient Outreach (Signed)
[place] [facility]) Care Management  09/30/2017  Bonnie Holt 12-02-1945 [id number]   Care Coordination: Successful telephone encounter to [facility], the above patient's husband. [facility] RN CM  Re-introduced role of [facility] Community Case Management as husband did not remember phone call from last week. Initial home visit scheduled for tomorrow, Friday 10/01/17 at 9:30.  Plan: Home visit as scheduled above  Natsuko Kelsay E. Rollene Rotunda RN, BSN [facility] Care Management Coordinator [phone]

## 2017-10-01 ENCOUNTER — Other Ambulatory Visit: Payer: Self-pay

## 2017-10-01 ENCOUNTER — Encounter (INDEPENDENT_AMBULATORY_CARE_PROVIDER_SITE_OTHER): Payer: Self-pay

## 2017-10-01 NOTE — Patient Outreach (Signed)
[facility]) Care Management  Initial Home Visit-TOC week 2 10/01/2017  Bonnie Holt 11-Aug-1945 [id number]  Bonnie Holt is an 72 y.o. female  Subjective: Patient states she is doing much better. She remains tired and frustrated that she is unable to do the things she was able to do prior to her hospitalization. She states she has constantly had home appointments or MD appointments since her discharge from [facility] and has not had time to rest and recuperate. Patient is looking forward to a weekend of rest. Patient states she is very concerned about her elevated glucose levels. "The only complaint I have is that I am constantly cold since I have been discharged from the hospital and my sugars have been out of control. Dr. Ouida Sills says I am not on enough insulin to equal the pills I was taking before I got sick".  Objective: BP (!) 132/58 (BP Location: Left Arm, Patient Position: Sitting, Cuff Size: Large)   Pulse 71   Resp 16   Wt 212 lb (96.2 kg)   SpO2 96%   BMI 37.55 kg/m    Physical Exam  Constitutional: She is oriented to person, place, and time.  Cardiovascular: Normal rate, regular rhythm, normal heart sounds and intact distal pulses.  Respiratory: Effort normal and breath sounds normal.  Compliant with CPAP nightly for Sleep Apnea  GI: Soft. Bowel sounds are normal.  Musculoskeletal: Normal range of motion. She exhibits edema.  Mild non-pitting lower leg, ankle edema bilat  Neurological: She is alert and oriented to person, place, and time.  Skin: Skin is warm and dry. There is erythema.  Resolving erythema to lower legs related to significant lower extremity edema during critical illness  Psychiatric: She has a normal mood and affect. Her behavior is normal. Judgment and thought content normal.    Encounter Medications:   Outpatient Encounter Medications as of 10/01/2017  Medication Sig  . acetaminophen (TYLENOL) 325 MG tablet Place 2 tablets (650 mg  total) into feeding tube every 6 (six) hours as needed for mild pain (or Fever >/= 101).  Marland Kitchen amiodarone (PACERONE) 200 MG tablet Take 1 tablet (200 mg total) by mouth 2 (two) times daily.  Marland Kitchen aspirin EC 81 MG tablet Take 81 mg by mouth daily.   Marland Kitchen atorvastatin (LIPITOR) 80 MG tablet Take 1 tablet by mouth daily.   . chlorhexidine (PERIDEX) 0.12 % solution 15 mLs by Mouth Rinse route 2 (two) times daily.  . Cholecalciferol (VITAMIN D-3) 5000 units TABS Take 1 tablet by mouth daily.   . clopidogrel (PLAVIX) 75 MG tablet TAKE 1 TABLET BY MOUTH ONCE DAILY  . glipiZIDE (GLUCOTROL) 5 MG tablet Take 5 mg by mouth daily.  . insulin aspart (NOVOLOG) 100 UNIT/ML injection Inject 0-5 Units into the skin at bedtime. PLEASE USE LOW DOSE SSI AT snf  . insulin glargine (LANTUS) 100 UNIT/ML injection Inject 0.1 mLs (10 Units total) into the skin daily.  Marland Kitchen letrozole (FEMARA) 2.5 MG tablet Take 1 tablet (2.5 mg total) by mouth daily.  Marland Kitchen losartan (COZAAR) 25 MG tablet Take 25 mg by mouth daily.  . Melatonin 5 MG TABS Take 2 tablets (10 mg total) by mouth at bedtime.  . metoprolol succinate (TOPROL-XL) 50 MG 24 hr tablet Take 50 mg by mouth daily.   . multivitamin (RENA-VIT) TABS tablet Take 1 tablet by mouth at bedtime.  Marland Kitchen nystatin cream (MYCOSTATIN) Apply topically 2 (two) times daily.  . pantoprazole (PROTONIX) 40 MG tablet Take  1 tablet (40 mg total) by mouth daily before breakfast.  . ipratropium-albuterol (DUONEB) 0.5-2.5 (3) MG/3ML SOLN Take 3 mLs by nebulization every 6 (six) hours as needed. (Patient not taking: Reported on 10/01/2017)   No facility-administered encounter medications on file as of 10/01/2017.     Functional Status:   In your present state of health, do you have any difficulty performing the following activities: 10/01/2017 08/22/2017  Hearing? N -  Vision? N -  Difficulty concentrating or making decisions? N -  Walking or climbing stairs? Y -  Comment related to stamina and strength -   Dressing or bathing? N -  Doing errands, shopping? Y N  Comment related to stamina and strength -  Preparing Food and eating ? N -  Using the Toilet? N -  In the past six months, have you accidently leaked urine? Y -  Do you have problems with loss of bowel control? N -  Managing your Medications? N -  Managing your Finances? N -  Housekeeping or managing your Housekeeping? N -  Some recent data might be hidden    Fall/Depression Screening:    Fall Risk  10/01/2017 01/06/2017  Falls in the past year? No No   PHQ 2/9 Scores 10/01/2017 01/06/2017  PHQ - 2 Score 0 0    Assessment: Initial home visit today with patient and husband, Bonnie Holt. Patient is doing well. Ambulated to the door independently when [facility] RN CM arrived. Steady gait noted. Patient has Rolator available for MD appointments and long outings. She is beginning to venture out of the home and states she went to [place] recently and "did well" having the ability to sit down on the Rolator as needed. She hopes to regain her stamina and strength soon. Patient is receiving [facility] RN, PT, and OT services.  Patient denies needs for additional community services for food, medication, or transportation. Advanced Directives packet given and reviewed with patient and husband.   Patient weighs self, checks BP, and blood sugars daily. Has calendar to record from [facility] however she is very pleased with the [facility] calendar and plans to utilize so all information is concise and easy to transport to and from MD office.   Losartan added back to patients medication profile 7/16. BP parameters given by Cardiologist 140/90. Patient instructed to continue to take BP daily and notify Cardiologist if BP consistently reads above goal.  Fasting AM and HS blood sugars remain >200 which concerns patient. Patient restarted on her Glipizide 6/16 and continuing to take prescribed long acting insulin with night time sliding scale. RN CM instructed patient to continue to  take blood sugars BID and record. RN CM will follow CBG trend closely over course of reintroduction of oral medication and collaborate with MD as indicated. Per MD documentation, Metformin will also be reintroduced once patients renal function has returned to baseline/stabilized. Diabetes education including diet and exercise, s/s of hypo and hyperglycemia were reviewed during today's visit. Patient continues to treat hypoglycemic symptoms with peanut butter and cheese. Educated patient on 15 gm simple carb treatment. Patient states she is unaware of hyperglycemia.   Patient admits to staying hydrated and drinking plenty of water. Urine remains clear yellow. Patient feels like she is voiding an adequate amount for the amount of fluid consumed. Dialysis catheter to be removed on Monday 10/04/17.  Follow Up Appointments: 7/24 labs 7/31 6 month DM follow-up with PCP    Plan: Follow-up TOC call in 1 week.   THN  CM Care Plan Problem One     Most Recent Value  Care Plan Problem One  DM-self care  Role Documenting the Problem One  Care Management Bear for Problem One  Active  THN Long Term Goal   Patients morning fasting blood glucose level will decrease below 150 within the next 31 days  THN Long Term Goal Start Date  10/01/17  Interventions for Problem One Long Term Goal  discussed diabetic diet/exchange, gave patient living well with diabetes booklet for her to review, discussed importance of checking bloodsugars consistantly and reporting to MD, discussed taking medications as prescribed, discussed plan for daily exercise/walking  THN CM Short Term Goal #1   patient will check and record blood glucose levels BID for the next 30 days  THN CM Short Term Goal #1 Start Date  10/01/17  Interventions for Short Term Goal #1  discussed EMMI education on how to check bloodsugars, reviewed "normal" ADA fasting CBG levels, gave patient [facility] calendar and reviewed where to record BID CBGs  THN  CM Short Term Goal #2   patient will take all medications as prescribed over the next 30 days  THN CM Short Term Goal #2 Start Date  10/01/17  Interventions for Short Term Goal #2  Gave patient pill box to fill, discussed with paient [facility] education on how DM effects the body, reconciled medications to make sure all medications were in the home     Bonnie Martinovich E. Rollene Rotunda RN, BSN [facility] Care Management Coordinator [phone]

## 2017-10-04 ENCOUNTER — Encounter
Admission: RE | Disposition: A | Discharge status: Home / Self Care | Payer: Self-pay | Source: Ambulatory Visit | Attending: Vascular Surgery

## 2017-10-04 ENCOUNTER — Other Ambulatory Visit (INDEPENDENT_AMBULATORY_CARE_PROVIDER_SITE_OTHER): Payer: Self-pay | Admitting: Vascular Surgery

## 2017-10-04 ENCOUNTER — Ambulatory Visit
Admission: RE | Admit: 2017-10-04 | Discharge: 2017-10-04 | Disposition: A | Discharge status: Home / Self Care | Payer: PPO | Source: Ambulatory Visit | Attending: Vascular Surgery | Admitting: Vascular Surgery

## 2017-10-04 ENCOUNTER — Encounter: Payer: Self-pay | Admitting: Vascular Surgery

## 2017-10-04 DIAGNOSIS — Z992 Dependence on renal dialysis: Secondary | ICD-10-CM | POA: Diagnosis not present

## 2017-10-04 DIAGNOSIS — Z853 Personal history of malignant neoplasm of breast: Secondary | ICD-10-CM | POA: Insufficient documentation

## 2017-10-04 DIAGNOSIS — N186 End stage renal disease: Secondary | ICD-10-CM | POA: Diagnosis not present

## 2017-10-04 DIAGNOSIS — Z888 Allergy status to other drugs, medicaments and biological substances status: Secondary | ICD-10-CM | POA: Insufficient documentation

## 2017-10-04 DIAGNOSIS — T8249XA Other complication of vascular dialysis catheter, initial encounter: Secondary | ICD-10-CM | POA: Diagnosis not present

## 2017-10-04 DIAGNOSIS — Z818 Family history of other mental and behavioral disorders: Secondary | ICD-10-CM | POA: Insufficient documentation

## 2017-10-04 DIAGNOSIS — N189 Chronic kidney disease, unspecified: Secondary | ICD-10-CM | POA: Diagnosis not present

## 2017-10-04 DIAGNOSIS — Z8249 Family history of ischemic heart disease and other diseases of the circulatory system: Secondary | ICD-10-CM | POA: Insufficient documentation

## 2017-10-04 DIAGNOSIS — Z833 Family history of diabetes mellitus: Secondary | ICD-10-CM | POA: Insufficient documentation

## 2017-10-04 DIAGNOSIS — Z9889 Other specified postprocedural states: Secondary | ICD-10-CM | POA: Insufficient documentation

## 2017-10-04 DIAGNOSIS — I120 Hypertensive chronic kidney disease with stage 5 chronic kidney disease or end stage renal disease: Secondary | ICD-10-CM | POA: Diagnosis not present

## 2017-10-04 DIAGNOSIS — G4733 Obstructive sleep apnea (adult) (pediatric): Secondary | ICD-10-CM | POA: Diagnosis not present

## 2017-10-04 DIAGNOSIS — E1122 Type 2 diabetes mellitus with diabetic chronic kidney disease: Secondary | ICD-10-CM | POA: Diagnosis not present

## 2017-10-04 DIAGNOSIS — Z8261 Family history of arthritis: Secondary | ICD-10-CM | POA: Insufficient documentation

## 2017-10-04 DIAGNOSIS — E119 Type 2 diabetes mellitus without complications: Secondary | ICD-10-CM | POA: Diagnosis not present

## 2017-10-04 DIAGNOSIS — Z808 Family history of malignant neoplasm of other organs or systems: Secondary | ICD-10-CM | POA: Insufficient documentation

## 2017-10-04 DIAGNOSIS — I10 Essential (primary) hypertension: Secondary | ICD-10-CM | POA: Diagnosis not present

## 2017-10-04 HISTORY — PX: DIALYSIS/PERMA CATHETER REMOVAL: CATH118289

## 2017-10-04 SURGERY — DIALYSIS/PERMA CATHETER REMOVAL
Anesthesia: LOCAL

## 2017-10-04 MED ORDER — LIDOCAINE-EPINEPHRINE (PF) 1 %-1:200000 IJ SOLN
INTRAMUSCULAR | Status: DC | PRN
  Administered 2017-10-04: 10 mL via INTRADERMAL

## 2017-10-04 SURGICAL SUPPLY — 4 items
FCP FG STRG 5.5XNS LF DISP (INSTRUMENTS) ×1
FORCEPS FG STRG 5.5XNS LF DISP (INSTRUMENTS) ×1 IMPLANT
FORCEPS KELLY 5.5 STR (INSTRUMENTS) ×1
TRAY LACERAT/PLASTIC (MISCELLANEOUS) ×2 IMPLANT

## 2017-10-04 NOTE — Op Note (Signed)
Operative Note     Preoperative diagnosis:   1. ESRD with functional permanent access  Postoperative diagnosis:  1. ESRD with functional permanent access  Procedure:  Removal of right jugular Permcath  Surgeon:  Leotis Pain, MD  Anesthesia:  Local  EBL:  Minimal  Indication for the Procedure:  The patient has a functional permanent dialysis access and no longer needs their permcath.  This can be removed.  Risks and benefits are discussed and informed consent is obtained.  Description of the Procedure:  The patient's right neck, chest and existing catheter were sterilely prepped and draped. The area around the catheter was anesthetized copiously with 1% lidocaine. The catheter was dissected out with curved hemostats until the cuff was freed from the surrounding fibrous sheath. The fiber sheath was transected, and the catheter was then removed in its entirety using gentle traction. Pressure was held and sterile dressings were placed. The patient tolerated the procedure well and was taken to the recovery room in stable condition.     Leotis Pain  10/04/2017, 2:30 PM This note was created with Dragon Medical transcription system. Any errors in dictation are purely unintentional.
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
## 2017-10-04 NOTE — Discharge Instructions (Signed)
Incision Care, Adult °An incision is a cut that a doctor makes in your skin for surgery (for a procedure). Most times, these cuts are closed after surgery. Your cut from surgery may be closed with stitches (sutures), staples, skin glue, or skin tape (adhesive strips). You may need to return to your doctor to have stitches or staples taken out. This may happen many days or many weeks after your surgery. The cut needs to be well cared for so it does not get infected. °How to care for your cut °Cut care °· Follow instructions from your doctor about how to take care of your cut. Make sure you: °? Wash your hands with soap and water before you change your bandage (dressing). If you cannot use soap and water, use hand sanitizer. °? Change your bandage as told by your doctor. °? Leave stitches, skin glue, or skin tape in place. They may need to stay in place for 2 weeks or longer. If tape strips get loose and curl up, you may trim the loose edges. Do not remove tape strips completely unless your doctor says it is okay. °· Check your cut area every day for signs of infection. Check for: °? More redness, swelling, or pain. °? More fluid or blood. °? Warmth. °? Pus or a bad smell. °· Ask your doctor how to clean the cut. This may include: °? Using mild soap and water. °? Using a clean towel to pat the cut dry after you clean it. °? Putting a cream or ointment on the cut. Do this only as told by your doctor. °? Covering the cut with a clean bandage. °· Ask your doctor when you can leave the cut uncovered. °· Do not take baths, swim, or use a hot tub until your doctor says it is okay. Ask your doctor if you can take showers. You may only be allowed to take sponge baths for bathing. °Medicines °· If you were prescribed an antibiotic medicine, cream, or ointment, take the antibiotic or put it on the cut as told by your doctor. Do not stop taking or putting on the antibiotic even if your condition gets better. °· Take  over-the-counter and prescription medicines only as told by your doctor. °General instructions °· Limit movement around your cut. This helps healing. °? Avoid straining, lifting, or exercise for the first month, or for as long as told by your doctor. °? Follow instructions from your doctor about going back to your normal activities. °? Ask your doctor what activities are safe. °· Protect your cut from the sun when you are outside for the first 6 months, or for as long as told by your doctor. Put on sunscreen around the scar or cover up the scar. °· Keep all follow-up visits as told by your doctor. This is important. °Contact a doctor if: °· Your have more redness, swelling, or pain around the cut. °· You have more fluid or blood coming from the cut. °· Your cut feels warm to the touch. °· You have pus or a bad smell coming from the cut. °· You have a fever or shaking chills. °· You feel sick to your stomach (nauseous) or you throw up (vomit). °· You are dizzy. °· Your stitches or staples come undone. °Get help right away if: °· You have a red streak coming from your cut. °· Your cut bleeds through the bandage and the bleeding does not stop with gentle pressure. °· The edges of your cut open   up and separate. °· You have very bad (severe) pain. °· You have a rash. °· You are confused. °· You pass out (faint). °· You have trouble breathing and you have a fast heartbeat. °This information is not intended to replace advice given to you by your health care provider. Make sure you discuss any questions you have with your health care provider. °Document Released: 05/25/2011 Document Revised: 11/08/2015 Document Reviewed: 11/08/2015 °Elsevier Interactive Patient Education © 2017 [employer] ° °

## 2017-10-06 DIAGNOSIS — E7800 Pure hypercholesterolemia, unspecified: Secondary | ICD-10-CM | POA: Diagnosis not present

## 2017-10-06 DIAGNOSIS — I10 Essential (primary) hypertension: Secondary | ICD-10-CM | POA: Diagnosis not present

## 2017-10-06 DIAGNOSIS — E119 Type 2 diabetes mellitus without complications: Secondary | ICD-10-CM | POA: Diagnosis not present

## 2017-10-06 DIAGNOSIS — I25119 Atherosclerotic heart disease of native coronary artery with unspecified angina pectoris: Secondary | ICD-10-CM | POA: Diagnosis not present

## 2017-10-08 ENCOUNTER — Other Ambulatory Visit: Payer: Self-pay

## 2017-10-08 NOTE — Patient Outreach (Signed)
[name] Care Management  10/08/2017  Bonnie Holt 15-Aug-1945 [id number]   TOC Week 3 Call: Successful telephone encounter to the above patient after RN CM left HIPAA compliant VM message requesting callback. Patient states she is doing well and regaining a little more energy each day. She and husband are spending the day "making soup from the winter from vegetables we picked from our garden". Patient able to spend time working in a large garden without loosing stamina and strength. Denies recent falls. PT continues however last visit will be next Thursday, August 1. OT has made an evaluation but patient did not require services. HH RN will make last visit on Monday, July 29.  Patient states she has all her medications and denies additional community service needs.  Dialysis catheter removed this past Monday. Patient denies redness, drainage, or tenderness to touch at site. Has discharge instructions from outpatient hospital visit for procedure. Instructed to notify PCP with symptoms of infection.  Patient continues to check her weight and BP daily. Today's weight 203.2. Patient is trying to loose weight by watching her carbs and adding light walking to her daily activities to better control her DM. BP 109/63  She continues to be concerned regarding her high CBG readings. States she has not had one less than 190 in 1 week. This is with the addition of glipizide at her last PCP visit a week ago. Todays fasting blood glucose was 206 and last evening also 206. Patient encouraged to continue to walk daily, follow the diabetic exchange diet RN CM gave to patient and have a conversation with PCP during follow up visit next week regarding other interventions such as increasing insulin, adding metformin back if appropriate with renal function. Patient also requesting to see an endocrinologist. Encouraged her to request a referral from PCP as she states blood sugars were not controlled  with oral medications before her hospital stay for critical illness.  Plan: Follow up with patient in 1 week post PCP appointment.  Bonnie Mckenna E. Rollene Rotunda RN, BSN [facility] Care Management Coordinator [phone]

## 2017-10-13 DIAGNOSIS — I10 Essential (primary) hypertension: Secondary | ICD-10-CM | POA: Diagnosis not present

## 2017-10-13 DIAGNOSIS — Z0000 Encounter for general adult medical examination without abnormal findings: Secondary | ICD-10-CM | POA: Diagnosis not present

## 2017-10-13 DIAGNOSIS — Z6841 Body Mass Index (BMI) 40.0 and over, adult: Secondary | ICD-10-CM | POA: Diagnosis not present

## 2017-10-13 DIAGNOSIS — Z9989 Dependence on other enabling machines and devices: Secondary | ICD-10-CM | POA: Diagnosis not present

## 2017-10-13 DIAGNOSIS — E119 Type 2 diabetes mellitus without complications: Secondary | ICD-10-CM | POA: Diagnosis not present

## 2017-10-13 DIAGNOSIS — G4733 Obstructive sleep apnea (adult) (pediatric): Secondary | ICD-10-CM | POA: Diagnosis not present

## 2017-10-13 DIAGNOSIS — E7800 Pure hypercholesterolemia, unspecified: Secondary | ICD-10-CM | POA: Diagnosis not present

## 2017-10-13 DIAGNOSIS — I25119 Atherosclerotic heart disease of native coronary artery with unspecified angina pectoris: Secondary | ICD-10-CM | POA: Diagnosis not present

## 2017-10-14 DIAGNOSIS — N185 Chronic kidney disease, stage 5: Secondary | ICD-10-CM | POA: Diagnosis not present

## 2017-10-14 DIAGNOSIS — E1129 Type 2 diabetes mellitus with other diabetic kidney complication: Secondary | ICD-10-CM | POA: Diagnosis not present

## 2017-10-14 DIAGNOSIS — R600 Localized edema: Secondary | ICD-10-CM | POA: Diagnosis not present

## 2017-10-14 DIAGNOSIS — I10 Essential (primary) hypertension: Secondary | ICD-10-CM | POA: Diagnosis not present

## 2017-10-15 ENCOUNTER — Other Ambulatory Visit: Payer: Self-pay

## 2017-10-15 NOTE — Patient Outreach (Signed)
[facility]) Care Management  10/15/2017  Bonnie Holt 1945/06/09 [id number]   TOC Week 4 Call: Successful telephone encounter to the above patient. Patient states she continues to do well. She is able to do more around the home and walks daily as tolerated for exercise. All HH services have been closed. Patient denies recent falls. Adheres to medications as prescribed. Medication compliance reinforced. Denies need for additional community resources or services.  Patient continues to struggle with elevated fasting blood glucose readings. Recent A1C 8.0. This is causing her "worry" and "frustration" Fasting am glucose 201. Discussed with PCP at recent visit however no changes made to medication regimen. States unable to add Metformin back to medication list related to renal function still not back to normal. Nephrology has order a renal ultrasound to look for obstruction. Patient has been referred to a "life style modification/DM education class. She is awaiting a call with more information. No discussion with PCP visit regarding referral to endocrinologist.  Plan: Contact PCP to discuss elevated CBGs and collaborate plan of care. Follow up phone call in one week.  Brodrick Curran E. Rollene Rotunda RN, BSN [facility] Care Management Coordinator [phone]

## 2017-10-19 ENCOUNTER — Other Ambulatory Visit: Payer: Self-pay | Admitting: Nephrology

## 2017-10-19 DIAGNOSIS — N185 Chronic kidney disease, stage 5: Secondary | ICD-10-CM

## 2017-10-21 ENCOUNTER — Encounter: Payer: PPO | Attending: Internal Medicine | Admitting: *Deleted

## 2017-10-21 ENCOUNTER — Encounter: Payer: Self-pay | Admitting: *Deleted

## 2017-10-21 ENCOUNTER — Other Ambulatory Visit: Payer: Self-pay | Admitting: *Deleted

## 2017-10-21 VITALS — BP 102/60 | Ht 63.0 in | Wt 207.4 lb

## 2017-10-21 DIAGNOSIS — I509 Heart failure, unspecified: Secondary | ICD-10-CM | POA: Diagnosis not present

## 2017-10-21 DIAGNOSIS — Z7984 Long term (current) use of oral hypoglycemic drugs: Secondary | ICD-10-CM | POA: Diagnosis not present

## 2017-10-21 DIAGNOSIS — E119 Type 2 diabetes mellitus without complications: Secondary | ICD-10-CM

## 2017-10-21 DIAGNOSIS — Z713 Dietary counseling and surveillance: Secondary | ICD-10-CM | POA: Diagnosis not present

## 2017-10-21 DIAGNOSIS — Z6841 Body Mass Index (BMI) 40.0 and over, adult: Secondary | ICD-10-CM | POA: Diagnosis not present

## 2017-10-21 DIAGNOSIS — Z955 Presence of coronary angioplasty implant and graft: Secondary | ICD-10-CM | POA: Diagnosis not present

## 2017-10-21 DIAGNOSIS — B961 Klebsiella pneumoniae [K. pneumoniae] as the cause of diseases classified elsewhere: Secondary | ICD-10-CM | POA: Diagnosis not present

## 2017-10-21 DIAGNOSIS — I2510 Atherosclerotic heart disease of native coronary artery without angina pectoris: Secondary | ICD-10-CM | POA: Diagnosis not present

## 2017-10-21 DIAGNOSIS — Z853 Personal history of malignant neoplasm of breast: Secondary | ICD-10-CM | POA: Diagnosis not present

## 2017-10-21 DIAGNOSIS — G4733 Obstructive sleep apnea (adult) (pediatric): Secondary | ICD-10-CM | POA: Diagnosis not present

## 2017-10-21 DIAGNOSIS — N390 Urinary tract infection, site not specified: Secondary | ICD-10-CM | POA: Diagnosis not present

## 2017-10-21 DIAGNOSIS — I110 Hypertensive heart disease with heart failure: Secondary | ICD-10-CM | POA: Diagnosis not present

## 2017-10-21 NOTE — Patient Outreach (Signed)
[facility]) Care Management [place] Telephone Outreach, Transition of Care day 30  10/21/2017  Bonnie Holt 08-27-1945 [id number]  Successful telephone outreach to Bonnie Holt, 72 y/o female referred to [facility] for transition of Care after recent hospitalization June 9- September 15, 2017 for sepsis/ acute respiratory failure/ AKI after patient experienced tick bite.  Patient was discharged from hospital to SNF for rehabilitation and was subsequently discharged from SNF back home to self- care with home health services in place, which are now complete. Patient has history including, but not limited to, CAD; DM; breast CA.  HIPAA/ identity verified with patient during phone call today, and I explained that I was contacting patient for primary [facility] RN CM Bonnie Holt).  Today, patient reports that she "is doing very well;" and she denies pain, new/ recent falls/ concerns, issues/ problems.  Patient sounds to be in no obvious/ apparent distress throughout phone call today.  Patient further reports: -- has/ is taking medications as prescribed; reports that she was contacted by phone by PCP office on August 7,2019 (yesterday) and that ASA was discontinued and Eliquis added; patient denies questions around these changes, and reports that she is currently taking Eliquis as prescribed; medication list in EMR updated to reflect changes reported by patient today. -- plans to attend DM education classes scheduled later this afternoon; reports "looking forward" to attending -- has continued monitoring and recording daily weights; reports weights "holding steady" and reports weight today as "204 lbs." -- has continued monitoring and recording daily blood sugars at home 2-3 times per day; reports "improvement" in blood sugars this week;" states that her "highest blood sugar this week was today-- 165 (fasting)."  Reports blood sugars "below 200" all week, which is "better" than last  week, when she reports sugars were all running "above 200."  Patient states that she has not made any recent changes to plan of care around DM, and adds that she is "not sure" why she has seen improvement in her blood sugar readings this week.  Positive reinforcement provided that patient has continued monitoring/ recording blood sugars at home.  Briefly reviewed patient's medications for DM; patient verbalizes accurate report of DM medications and confirms that she has not been re-started on metformin.   Patient denies further issues, concerns, or problems today.  I confirmed that patient has the main [facility] CM office phone number, and the [facility] CM 24-hour nurse advice phone number should issues arise prior to next scheduled [place] outreach, and reminded patient of upcoming scheduled [facility] RN CM home visit next week with primary [facility] RN CCM; patient confirms that this is on her calendar.    Plan:  Will update patient's primary [facility] RN CM of today's successful telephone outreach to patient  [facility] CM Care Plan Problem One     Most Recent Value  Care Plan Problem One  DM-self care  Role Documenting the Problem One  Care Management [place] for Problem One  Active  [facility] Long Term Goal   Patients morning fasting blood glucose level will decrease below 150 within the next 31 days  [facility] Long Term Goal Start Date  10/01/17  Interventions for Problem One Long Term Goal  Reviewed recent blood sugars with patient and confirmed that she has continued monitoring and recording blood sugars at home,  confirmed that patient is able to verbalize accurate report of current insulin dosing,  confirmed that patient will attend  DM education classes scheduled for today  [facility] CM Short Term Goal #1   patient will check and record blood glucose levels BID for the next 30 days  [facility] CM Short Term Goal #1 Start Date  10/01/17  Interventions for Short Term Goal #1  Confirmed that patient continues  monitoring and recording blood sugars at home- reviewed recent blood sugar trends at home with patient  [facility] CM Short Term Goal #2   patient will take all medications as prescribed over the next 30 days  [facility] CM Short Term Goal #2 Start Date  10/01/17  Interventions for Short Term Goal #2  Reviewed with patient medication changes made recently by PCP to stop ASA and start Eliquis,  updated medication list in EMR and confirmed that patient is able to verbalize accurate report of insulin dosing and continues taking glucotrol     Bonnie Rack, RN, BSN, [place] Management  [phone]

## 2017-10-21 NOTE — Patient Instructions (Signed)
Check blood sugars 2 x day before breakfast and before supper every day Bring blood sugar records to the next class  Exercise: Continue walking for  45 minutes  7 days a week  Eat 3 meals day,  1-2  snacks a day Space meals 4-6 hours apart Include a protein with breakfast  Carry fast acting glucose and a snack at all times Rotate injection sites Start with 10 units Levemir in the morning Take NovoLog based on sliding scale 30 minutes before supper  Return for classes on:

## 2017-10-22 ENCOUNTER — Telehealth: Payer: Self-pay | Admitting: *Deleted

## 2017-10-22 NOTE — Progress Notes (Signed)
Diabetes Self-Management Education  Visit Type: First/Initial  Appt. Start Time: 1340 Appt. End Time: 1510  10/22/2017  [name], identified by name and date of birth, is a 72 y.o. female with a diagnosis of Diabetes: Type 2.   ASSESSMENT  Blood pressure 102/60, height 5\' 3"  (1.6 m), weight 207 lb 6.4 oz (94.1 kg). Body mass index is 36.74 kg/m.  Diabetes Self-Management Education - 10/21/17 1541      Visit Information   Visit Type  First/Initial      Initial Visit   Diabetes Type  Type 2    Are you currently following a meal plan?  Yes    What type of meal plan do you follow?  low carbs    Are you taking your medications as prescribed?  Yes    Date Diagnosed  2012      Health Coping   How would you rate your overall health?  Good      Psychosocial Assessment   Patient Belief/Attitude about Diabetes  Motivated to manage diabetes    Self-care barriers  short term memory loss   Self-management support  Family;Doctor's office    Patient Concerns  Nutrition/Meal planning;Medication;Monitoring;Weight Control;Healthy Lifestyle;Glycemic Control    Special Needs  None    Preferred Learning Style  Auditory;Hands on    Learning Readiness  Change in progress    How often do you need to have someone help you when you read instructions, pamphlets, or other written materials from your doctor or pharmacy?  1 - Never    What is the last grade level you completed in school?  some college      Pre-Education Assessment   Patient understands the diabetes disease and treatment process.  Needs Review    Patient understands incorporating nutritional management into lifestyle.  Needs Review    Patient undertands incorporating physical activity into lifestyle.  Demonstrates understanding / competency    Patient understands using medications safely.  Needs Review    Patient understands monitoring blood glucose, interpreting and using results  Needs Review    Patient understands  prevention, detection, and treatment of acute complications.  Needs Instruction    Patient understands prevention, detection, and treatment of chronic complications.  Needs Review    Patient understands how to develop strategies to address psychosocial issues.  Needs Instruction    Patient understands how to develop strategies to promote health/change behavior.  Needs Instruction      Complications   Last HgB A1C per patient/outside source  8 %    How often do you check your blood sugar?  1-2 times/day    Fasting Blood glucose range (mg/dL)  130-179;180-200;>200  FBG's 153-253 mg/dL - trending down to the 100's in the last week   Postprandial Blood glucose range (mg/dL)  bedtime readings 136-341 mg/dL - trending down in the last week to 100's an low 200's   Have you had a dilated eye exam in the past 12 months?  No    Have you had a dental exam in the past 12 months?  Yes    Are you checking your feet?  Yes    How many days per week are you checking your feet?  7      Dietary Intake   Breakfast  Cheerios or cheese toast and bacon (doesn't like eggs)    Snack (morning)  non-starchy veggies, fruit, nuts    Lunch  1/2 ham sandwich    Snack (afternoon)  same as  morning    Dinner  chicken, beef, occasional pork, fish -  green beans, peas, beans, occasional rice and pasta, broccoli, cauliflower, carrots, cuccumbers,     Beverage(s)  water, coffee, Crystal lite      Exercise   Exercise Type  Light (walking / raking leaves)    How many days per week to you exercise?  7    How many minutes per day do you exercise?  45    Total minutes per week of exercise  315      Patient Education   Previous Diabetes Education  Yes  2012 in [place]   Disease state   Explored patient's options for treatment of their diabetes    Nutrition management   Role of diet in the treatment of diabetes and the relationship between the three main macronutrients and blood glucose level;Food label reading, portion  sizes and measuring food.;Reviewed blood glucose goals for pre and post meals and how to evaluate the patients' food intake on their blood glucose level.;Meal timing in regards to the patients' current diabetes medication.    Physical activity and exercise   Role of exercise on diabetes management, blood pressure control and cardiac health.    Medications  Taught/reviewed insulin injection, site rotation, insulin storage and needle disposal.;Reviewed patients medication for diabetes, action, purpose, timing of dose and side effects.    Monitoring  Purpose and frequency of SMBG.;Taught/discussed recording of test results and interpretation of SMBG.;Identified appropriate SMBG and/or A1C goals.;Yearly dilated eye exam    Acute complications  Taught treatment of hypoglycemia - the 15 rule.    Chronic complications  Relationship between chronic complications and blood glucose control    Psychosocial adjustment  Identified and addressed patients feelings and concerns about diabetes      Individualized Goals (developed by patient)   Reducing Risk  Improve blood sugars Decrease medications Prevent diabetes complications Lose weight Lead a healthier lifestyle Become more fit     Outcomes   Expected Outcomes  Demonstrated interest in learning. Expect positive outcomes    Future DMSE  4-6 wks       Individualized Plan for Diabetes Self-Management Training:   Learning Objective:  Patient will have a greater understanding of diabetes self-management. Patient education plan is to attend individual and/or group sessions per assessed needs and concerns.   Plan:   Patient Instructions  Check blood sugars 2 x day before breakfast and before supper every day Bring blood sugar records to the next class Exercise: Continue walking for  45 minutes  7 days a week Eat 3 meals day,  1-2  snacks a day Space meals 4-6 hours apart Include a protein with breakfast Carry fast acting glucose and a snack at all  times Rotate injection sites Start with 10 units Levemir in the morning Take NovoLog based on sliding scale 30 minutes before supper  Expected Outcomes:  Demonstrated interest in learning. Expect positive outcomes  Education material provided:  General Meal Planning Guidelines Simple Meal Plan Glucose tablets Symptoms, causes and treatments of Hypoglycemia  If problems or questions, patient to contact team via:  Johny Drilling, [place], [place], CDE [phone]  Future DSME appointment: 4-6 wks  November 22, 2017 for Diabetes Class 1

## 2017-10-22 NOTE — Telephone Encounter (Signed)
Phone call to follow up with insulin. Pt reports she hasn't had any symptoms of hypoglycemia and her blood sugar before lunch was 116. Will follow up next week regarding insulin administration.

## 2017-10-22 NOTE — Telephone Encounter (Signed)
Received call from patient with sliding scale given to her from [facility] when she was in rehab. She is supposed to take 3 units plus sliding scale of NovoLog. Pt reports 61-250 take 0 extra units, 251-300 1 extra unit, 301-350 3 extra units, 351-400 5 extra units, >400 call doctor. Pt reports that she was told to use 3 clicks on her pens and she has been given 3 units of NovoLog but has also been taking 3 units of Levemir instead of 15 units. We discussed yesterday to start with 10 units of Levemir in the morning and take NovoLog before supper instead of bedtime due to her improvement in blood sugars and to avoid a low blood sugar during the night. She also reports that she took 10 units of NovoLog instead of Levemir this morning and her blood sugar was 158. She fell asleep and didn't check her blood sugars last night or take her NovoLog. Instructed her to not take Levemir today nor Glipizide since she was confused about her insulins. We identified the pens by name and color again. She will begin tomorrow taking Levemir 10 units in the morning and 3 units of NovoLog 15 minutes before eating supper. Pt to check blood sugars before breakfast and supper and sometimes 2 hours after supper. Discussed treatments of hypoglycemia again and to eat breakfast with protein and carbohydrates and not skip lunch today.

## 2017-10-25 ENCOUNTER — Telehealth: Payer: Self-pay | Admitting: *Deleted

## 2017-10-25 NOTE — Telephone Encounter (Signed)
Phone call to patient to follow up with insulin administration and blood sugars. Pt still having difficulty understanding insulin dosages. She did restart her Glipizide over the week-end but has only been taking 4 units of Levemir in the mornings instead of 10 units. Instructed her to dial to 10 units on her pen and the 10 should be visible in the window of the pen. She was taking her NovoLog at bedtime again. Instructed her to take NovoLog 3 units before supper and add sliding scale if needed. Identified Levemir as green top and NovoLog as orange top. Pt wrote down instructions again. FBG's 208, 179, 159, before lunch 139 and 189, before supper 170, 127 mg/dL. Will follow up later this week.

## 2017-10-27 ENCOUNTER — Ambulatory Visit
Admission: RE | Admit: 2017-10-27 | Discharge: 2017-10-27 | Disposition: A | Discharge status: Home / Self Care | Payer: PPO | Source: Ambulatory Visit | Attending: Nephrology | Admitting: Nephrology

## 2017-10-27 ENCOUNTER — Other Ambulatory Visit: Payer: Self-pay

## 2017-10-27 DIAGNOSIS — N185 Chronic kidney disease, stage 5: Secondary | ICD-10-CM | POA: Insufficient documentation

## 2017-10-27 NOTE — Patient Outreach (Signed)
[facility]) Care Management   10/27/2017  Bonnie Holt 1945/08/26 [id number]  Bonnie Holt is an 72 y.o. female  Subjective: Patient states she is doing OK. C/O short term memory deficits since recent hospitalization for critical illness. States "I recently took the dog to the vet for him to look at a mouth tumor. He said he would remove it when we clean his teeth. I did not remember that conversation and my husband had to remind me". Patient states this concerns her and is frustrating.   She is working with "someone" on her diabetes and insulin dosages. She will begin diabetes education classes in September. "I am doing well with checking my sugars, weighing, and checking my blood pressure every day and writing them down". She would like to continue to loose weight and "get off this insulin". States she is trying to eat "better" and walking daily but still has to use her Rolator for long distances and endurance.  States she is having a renal scan this afternoon to look for "blockages".  Objective:   BP 98/60 (BP Location: Right Arm, Patient Position: Sitting, Cuff Size: Large)   Pulse 64   Resp 20   Wt 202 lb 9.6 oz (91.9 kg)   SpO2 94%   BMI 35.89 kg/m   Physical Exam  Constitutional: She is oriented to person, place, and time. She appears well-developed and well-nourished.  Cardiovascular: Normal rate, regular rhythm, normal heart sounds and intact distal pulses.  Respiratory: Effort normal and breath sounds normal.  GI: Soft. Bowel sounds are normal.  Musculoskeletal: Normal range of motion. She exhibits edema.  Mild non-pitting lower leg and ankle edema bilat  Neurological: She is alert and oriented to person, place, and time.  Patient complains of short term memory loss and forgetfulness since hospital discharge.  Skin: Skin is warm and dry. There is erythema.  Mild erythema noted to lower legs  Psychiatric: She has a normal mood and affect. Her behavior  is normal. Judgment and thought content normal.    Encounter Medications:   Outpatient Encounter Medications as of 10/27/2017  Medication Sig Note  . acetaminophen (TYLENOL) 325 MG tablet Place 2 tablets (650 mg total) into feeding tube every 6 (six) hours as needed for mild pain (or Fever >/= 101). 10/27/2017: Route is by mouth  . apixaban (ELIQUIS) 5 MG TABS tablet Take 5 mg by mouth 2 (two) times daily. 10/21/2017: Patient reports this medication was added this week after she received phone call from Dr. Ouida Sills; reports ASA was discontinued  . atorvastatin (LIPITOR) 80 MG tablet Take 1 tablet by mouth daily.    . calcium citrate-vitamin D (CITRACAL+D) 315-200 MG-UNIT tablet Take 1 tablet by mouth daily.   . chlorhexidine (PERIDEX) 0.12 % solution 15 mLs by Mouth Rinse route 2 (two) times daily.   . Cholecalciferol (VITAMIN D-3) 5000 units TABS Take 1 tablet by mouth daily.    Marland Kitchen CINNAMON PO Take 1 capsule by mouth daily.   . clopidogrel (PLAVIX) 75 MG tablet TAKE 1 TABLET BY MOUTH ONCE DAILY   . Coenzyme Q10 (COQ10 PO) Take 1 tablet by mouth daily.   . Flaxseed, Linseed, (FLAXSEED OIL) 1200 MG CAPS Take 1 capsule by mouth daily.   Marland Kitchen glipiZIDE (GLUCOTROL) 5 MG tablet Take 5 mg by mouth daily.   . insulin aspart (NOVOLOG) 100 UNIT/ML injection Inject 0-5 Units into the skin at bedtime. PLEASE USE LOW DOSE SSI AT snf 10/27/2017: Patient only taking  3 u prior to pm meal daily  . Insulin Detemir (LEVEMIR FLEXTOUCH) 100 UNIT/ML Pen Inject 10 Units into the skin daily.    Marland Kitchen letrozole (FEMARA) 2.5 MG tablet Take 1 tablet (2.5 mg total) by mouth daily.   Marland Kitchen losartan (COZAAR) 25 MG tablet Take 50 mg by mouth daily.    . Melatonin 5 MG TABS Take 2 tablets (10 mg total) by mouth at bedtime. (Patient taking differently: Take 10 mg by mouth at bedtime as needed. )   . metoprolol succinate (TOPROL-XL) 50 MG 24 hr tablet Take 50 mg by mouth daily.    . multivitamin (RENA-VIT) TABS tablet Take 1 tablet by mouth  at bedtime.   . torsemide (DEMADEX) 20 MG tablet Take 1 tablet by mouth daily as needed.   . nystatin cream (MYCOSTATIN) Apply topically 2 (two) times daily. (Patient not taking: Reported on 10/27/2017)    No facility-administered encounter medications on file as of 10/27/2017.     Functional Status:   In your present state of health, do you have any difficulty performing the following activities: 10/01/2017 08/22/2017  Hearing? N -  Vision? N -  Difficulty concentrating or making decisions? N -  Walking or climbing stairs? Y -  Comment related to stamina and strength -  Dressing or bathing? N -  Doing errands, shopping? Y N  Comment related to stamina and strength -  Preparing Food and eating ? N -  Using the Toilet? N -  In the past six months, have you accidently leaked urine? Y -  Do you have problems with loss of bowel control? N -  Managing your Medications? N -  Managing your Finances? N -  Housekeeping or managing your Housekeeping? N -  Some recent data might be hidden    Fall/Depression Screening:    Fall Risk  10/21/2017 10/21/2017 10/01/2017  Falls in the past year? No (No Data) No  Comment - Denies new/ recent falls -   PHQ 2/9 Scores 10/21/2017 10/01/2017 01/06/2017  PHQ - 2 Score 0 0 0    Assessment: Patient pleasantly greeted RN CM at the door upon arrival. Ambulating independently without her Rolator. Steady gait noted. Patient appears "tired". Just finished vacuuming.  Prior to appointment RN CM has reviewed previous notes in EMR by Alfredia Client, RN Diabetes Educator at [facility]. RN CM sent inbasket and left VM with Educator to communicate CM involvement and to coordinate/close any gaps in care.   RN CM reviewed with patient currently prescribed doses of Levemir (dial to 10 units) every morning and Novolog (dial to 3 units) 15 minutes before evening meal. Prior to reinforcement and re-eduation patient remained confused regarding time to take Novolog. She initially "taught  back" she was to take 3 units at bedtime. This was hand written on her AVS. Patient marked out "at bedtime" beside 3 units and corrected. Patient able to read back correct dosages and correctly identify physical differences, specifically written name and color, of Levemir and NovoLog pens. Patient verbalized how to dial each pen to correct dosage.  Patient continues to check her sugars BID. TID at times. Does not understand why CBG increased from 150 fasting this am to 195 prior to lunch (over 2 hours from breakfast) when she took insulin as prescribed and "only had cheerios and a banana" for breakfast. RN CM discussed carbohydrate choices with patient and need for protein with each meal. Patient referred back to "Living well with DM" booklet.  Discussed memory  deficits with patient. Encouraged patient to discuss with PCP. Suggested to patient she not rely on memory but use calenders, sticky notes, note sections on smart phone, etc to overcome memory barrier. Patient is recording CBGs, BP, weight, and appointments appropriately on provided tools.   After completion of visit RN CM discussed visit with Alfredia Client, DM educator. Will continue to collaborate with DM educator to improve patient understanding and compliance with DM self care.  Plan: In home follow up in 1 month  [facility] CM Care Plan Problem One     Most Recent Value  Care Plan Problem One  DM-self care  Role Documenting the Problem One  Care Management [place] for Problem One  Active  [facility] Long Term Goal   Patients morning fasting blood glucose level will decrease below 150 within the next 31 days  [facility] Long Term Goal Start Date  10/27/17 Sudie Grumbling reset]  Interventions for Problem One Long Term Goal  discussed medication adherance, encouraged engagement with DM education classes, discussed diabetic exchange diet  [facility] CM Short Term Goal #1   patient will check and record blood glucose levels BID for the next 30 days  [facility] CM  Short Term Goal #1 Start Date  10/01/17  [facility] CM Short Term Goal #1 Met Date  10/27/17  [facility] CM Short Term Goal #2   patient will take all medications as prescribed over the next 30 days  [facility] CM Short Term Goal #2 Start Date  10/27/17 [date reset related to insulin dose change]  Interventions for Short Term Goal #2  reviewed with patient correct insulin dosages as prescribed by DM educator, assisted patient with writing down description of insulin pens and when to take each dose and amount to be taken     Jdyn Parkerson E. Rollene Rotunda RN, BSN [facility] Care Management Coordinator [phone]

## 2017-10-28 DIAGNOSIS — N898 Other specified noninflammatory disorders of vagina: Secondary | ICD-10-CM | POA: Diagnosis not present

## 2017-10-28 DIAGNOSIS — N952 Postmenopausal atrophic vaginitis: Secondary | ICD-10-CM | POA: Diagnosis not present

## 2017-10-29 ENCOUNTER — Telehealth: Payer: Self-pay | Admitting: *Deleted

## 2017-10-29 NOTE — Telephone Encounter (Signed)
Pt left voice mail earlier today that she wanted to switch her classes and come to morning series beginning Sept 5 instead of evening series. Phone call to follow up with classes and blood sugars. She was in her truck and didn't have her BG records with her. She reports that her fasting was 150's but her reading before lunch was 200 mg/dL. She is taking Levemir 10 units in the morning and NovoLog 3 units in the evening before supper. This was reinforced yesterday from home visit with Trish Fountain RN from [facility]. Will follow up with patient next week.

## 2017-11-01 ENCOUNTER — Telehealth: Payer: Self-pay | Admitting: *Deleted

## 2017-11-01 NOTE — Telephone Encounter (Signed)
Phone call to follow up with blood sugars. Pt is currently taking Levemir 10 units in the morning and NovoLog 3 units before supper. Blood sugars much improved. FBG's [id number] for the last 3 days. She has higher readings before lunch especially when eating waffles. She will start Diabetes classes on Sept 5. Instructed her to call for any questions. She reports she will call Dr Tonette Bihari office to refill her insulin.

## 2017-11-05 DIAGNOSIS — I10 Essential (primary) hypertension: Secondary | ICD-10-CM | POA: Diagnosis not present

## 2017-11-05 DIAGNOSIS — G4733 Obstructive sleep apnea (adult) (pediatric): Secondary | ICD-10-CM | POA: Diagnosis not present

## 2017-11-08 ENCOUNTER — Telehealth: Payer: Self-pay | Admitting: *Deleted

## 2017-11-08 NOTE — Telephone Encounter (Signed)
Phone call to follow up with patient and blood sugars. Left message for her to call back.

## 2017-11-11 ENCOUNTER — Other Ambulatory Visit: Payer: Self-pay | Admitting: Oncology

## 2017-11-11 ENCOUNTER — Ambulatory Visit
Admission: RE | Admit: 2017-11-11 | Discharge: 2017-11-11 | Disposition: A | Discharge status: Home / Self Care | Payer: PPO | Source: Ambulatory Visit | Attending: Oncology | Admitting: Oncology

## 2017-11-11 ENCOUNTER — Other Ambulatory Visit: Payer: Self-pay

## 2017-11-11 DIAGNOSIS — C50412 Malignant neoplasm of upper-outer quadrant of left female breast: Secondary | ICD-10-CM

## 2017-11-11 DIAGNOSIS — Z853 Personal history of malignant neoplasm of breast: Secondary | ICD-10-CM | POA: Insufficient documentation

## 2017-11-11 DIAGNOSIS — Z780 Asymptomatic menopausal state: Secondary | ICD-10-CM | POA: Insufficient documentation

## 2017-11-11 DIAGNOSIS — Z13820 Encounter for screening for osteoporosis: Secondary | ICD-10-CM | POA: Diagnosis not present

## 2017-11-11 DIAGNOSIS — R922 Inconclusive mammogram: Secondary | ICD-10-CM | POA: Diagnosis not present

## 2017-11-11 DIAGNOSIS — E119 Type 2 diabetes mellitus without complications: Secondary | ICD-10-CM | POA: Insufficient documentation

## 2017-11-11 DIAGNOSIS — Z7983 Long term (current) use of bisphosphonates: Secondary | ICD-10-CM | POA: Diagnosis not present

## 2017-11-11 DIAGNOSIS — R920 Mammographic microcalcification found on diagnostic imaging of breast: Secondary | ICD-10-CM

## 2017-11-11 DIAGNOSIS — M85851 Other specified disorders of bone density and structure, right thigh: Secondary | ICD-10-CM | POA: Insufficient documentation

## 2017-11-11 DIAGNOSIS — M8589 Other specified disorders of bone density and structure, multiple sites: Secondary | ICD-10-CM | POA: Diagnosis not present

## 2017-11-11 HISTORY — DX: Personal history of irradiation: Z92.3

## 2017-11-17 ENCOUNTER — Ambulatory Visit
Admission: RE | Admit: 2017-11-17 | Discharge: 2017-11-17 | Disposition: A | Discharge status: Home / Self Care | Payer: PPO | Source: Ambulatory Visit | Attending: Oncology | Admitting: Oncology

## 2017-11-17 ENCOUNTER — Other Ambulatory Visit: Payer: Self-pay

## 2017-11-17 DIAGNOSIS — N390 Urinary tract infection, site not specified: Secondary | ICD-10-CM | POA: Diagnosis not present

## 2017-11-17 DIAGNOSIS — I110 Hypertensive heart disease with heart failure: Secondary | ICD-10-CM | POA: Diagnosis not present

## 2017-11-17 DIAGNOSIS — Z853 Personal history of malignant neoplasm of breast: Secondary | ICD-10-CM | POA: Diagnosis not present

## 2017-11-17 DIAGNOSIS — G4733 Obstructive sleep apnea (adult) (pediatric): Secondary | ICD-10-CM | POA: Diagnosis not present

## 2017-11-17 DIAGNOSIS — Z955 Presence of coronary angioplasty implant and graft: Secondary | ICD-10-CM | POA: Diagnosis not present

## 2017-11-17 DIAGNOSIS — B961 Klebsiella pneumoniae [K. pneumoniae] as the cause of diseases classified elsewhere: Secondary | ICD-10-CM | POA: Diagnosis not present

## 2017-11-17 DIAGNOSIS — R920 Mammographic microcalcification found on diagnostic imaging of breast: Secondary | ICD-10-CM

## 2017-11-17 DIAGNOSIS — I2510 Atherosclerotic heart disease of native coronary artery without angina pectoris: Secondary | ICD-10-CM | POA: Diagnosis not present

## 2017-11-17 DIAGNOSIS — R921 Mammographic calcification found on diagnostic imaging of breast: Secondary | ICD-10-CM | POA: Diagnosis not present

## 2017-11-17 DIAGNOSIS — E119 Type 2 diabetes mellitus without complications: Secondary | ICD-10-CM | POA: Diagnosis not present

## 2017-11-17 DIAGNOSIS — I509 Heart failure, unspecified: Secondary | ICD-10-CM | POA: Diagnosis not present

## 2017-11-17 HISTORY — PX: BREAST BIOPSY: SHX20

## 2017-11-18 ENCOUNTER — Encounter: Payer: PPO | Attending: Internal Medicine | Admitting: Dietician

## 2017-11-18 ENCOUNTER — Encounter: Payer: Self-pay | Admitting: Dietician

## 2017-11-18 VITALS — Ht 63.0 in | Wt 209.6 lb

## 2017-11-18 DIAGNOSIS — E119 Type 2 diabetes mellitus without complications: Secondary | ICD-10-CM | POA: Diagnosis not present

## 2017-11-18 DIAGNOSIS — Z713 Dietary counseling and surveillance: Secondary | ICD-10-CM | POA: Insufficient documentation

## 2017-11-18 LAB — SURGICAL PATHOLOGY

## 2017-11-18 NOTE — Progress Notes (Signed)
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
## 2017-11-22 ENCOUNTER — Ambulatory Visit: Payer: PPO

## 2017-11-23 ENCOUNTER — Other Ambulatory Visit: Payer: Self-pay

## 2017-11-23 ENCOUNTER — Inpatient Hospital Stay: Payer: PPO | Attending: Oncology | Admitting: Oncology

## 2017-11-23 VITALS — BP 122/68 | HR 55 | Temp 98.1°F | Resp 18 | Wt 206.9 lb

## 2017-11-23 DIAGNOSIS — Z79811 Long term (current) use of aromatase inhibitors: Secondary | ICD-10-CM | POA: Insufficient documentation

## 2017-11-23 DIAGNOSIS — M8580 Other specified disorders of bone density and structure, unspecified site: Secondary | ICD-10-CM | POA: Insufficient documentation

## 2017-11-23 DIAGNOSIS — C50412 Malignant neoplasm of upper-outer quadrant of left female breast: Secondary | ICD-10-CM | POA: Diagnosis not present

## 2017-11-23 DIAGNOSIS — Z170 Estrogen receptor positive status [ER+]: Secondary | ICD-10-CM | POA: Insufficient documentation

## 2017-11-23 NOTE — Progress Notes (Signed)
Here for follow up. Stated she feels" just fine "

## 2017-11-24 DIAGNOSIS — I10 Essential (primary) hypertension: Secondary | ICD-10-CM | POA: Diagnosis not present

## 2017-11-24 DIAGNOSIS — R600 Localized edema: Secondary | ICD-10-CM | POA: Diagnosis not present

## 2017-11-24 DIAGNOSIS — N179 Acute kidney failure, unspecified: Secondary | ICD-10-CM | POA: Diagnosis not present

## 2017-11-25 ENCOUNTER — Encounter: Payer: PPO | Admitting: *Deleted

## 2017-11-25 ENCOUNTER — Encounter: Payer: Self-pay | Admitting: *Deleted

## 2017-11-25 VITALS — Wt 206.0 lb

## 2017-11-25 DIAGNOSIS — E119 Type 2 diabetes mellitus without complications: Secondary | ICD-10-CM

## 2017-11-25 DIAGNOSIS — Z713 Dietary counseling and surveillance: Secondary | ICD-10-CM | POA: Diagnosis not present

## 2017-11-25 NOTE — Progress Notes (Signed)

## 2017-11-29 ENCOUNTER — Ambulatory Visit: Payer: PPO

## 2017-12-01 ENCOUNTER — Other Ambulatory Visit: Payer: Self-pay

## 2017-12-01 DIAGNOSIS — R601 Generalized edema: Secondary | ICD-10-CM | POA: Diagnosis not present

## 2017-12-01 DIAGNOSIS — E1129 Type 2 diabetes mellitus with other diabetic kidney complication: Secondary | ICD-10-CM | POA: Diagnosis not present

## 2017-12-01 DIAGNOSIS — N184 Chronic kidney disease, stage 4 (severe): Secondary | ICD-10-CM | POA: Diagnosis not present

## 2017-12-01 NOTE — Patient Outreach (Signed)
[facility]) Care Management   12/01/2017  Bonnie Holt [birth year] [id number]  Bonnie Holt is an 72 y.o. female  Subjective: Patient states she is doing well however "today is not my best day". "I don't have very much energy". "My blood pressure was low at my kidney doctor and that may be why I don't have a lot of energy but he did not want to change my medications right now". Patient states she had a "good visit" with her nephrologist however "my numbers were still low and he wants to keep a check on them".  Patient states she is back to her everyday activities. She is driving, completing all ADLs independently, and maintaining her home. Her husband continues to cook most meals.  Objective:   BP (!) 100/52 (BP Location: Right Arm, Patient Position: Sitting, Cuff Size: Normal)   Pulse 60   Resp 16   Wt 197 lb 4.8 oz (89.5 kg) Comment: reported morning weight  SpO2 98%   BMI 34.95 kg/m   Physical Exam  Constitutional: She is oriented to person, place, and time. She appears well-developed and well-nourished.  Neck: Normal range of motion.  Cardiovascular: Normal rate, regular rhythm, normal heart sounds and intact distal pulses.  Respiratory: Effort normal and breath sounds normal. No respiratory distress.  GI: Soft. Bowel sounds are normal.  Genitourinary:  Genitourinary Comments: Patient describes bright yellow urine without odor  Musculoskeletal: Normal range of motion.  Neurological: She is alert and oriented to person, place, and time.  Skin: Skin is warm and dry.  Psychiatric: She has a normal mood and affect. Her behavior is normal. Judgment and thought content normal.    Encounter Medications:   Outpatient Encounter Medications as of 12/01/2017  Medication Sig Note  . acetaminophen (TYLENOL) 325 MG tablet Place 2 tablets (650 mg total) into feeding tube every 6 (six) hours as needed for mild pain (or Fever >/= 101). (Patient not taking: Reported on  11/23/2017) 10/27/2017: Route is by mouth  . apixaban (ELIQUIS) 5 MG TABS tablet Take 5 mg by mouth 2 (two) times daily. 10/21/2017: Patient reports this medication was added this week after she received phone call from Dr. Ouida Sills; reports ASA was discontinued  . atorvastatin (LIPITOR) 80 MG tablet Take 1 tablet by mouth daily.    Marland Kitchen BORIC ACID EX Place vaginally.   . calcium citrate-vitamin D (CITRACAL+D) 315-200 MG-UNIT tablet Take 1 tablet by mouth daily.   . chlorhexidine (PERIDEX) 0.12 % solution 15 mLs by Mouth Rinse route 2 (two) times daily.   . Cholecalciferol (VITAMIN D-3) 5000 units TABS Take 1 tablet by mouth daily.    Marland Kitchen CINNAMON PO Take 1 capsule by mouth daily.   . clopidogrel (PLAVIX) 75 MG tablet TAKE 1 TABLET BY MOUTH ONCE DAILY   . Coenzyme Q10 (COQ10 PO) Take 1 tablet by mouth daily.   Marland Kitchen estradiol (ESTRACE) 0.1 MG/GM vaginal cream    . Flaxseed, Linseed, (FLAXSEED OIL) 1200 MG CAPS Take 1 capsule by mouth daily.   Marland Kitchen glipiZIDE (GLUCOTROL) 5 MG tablet Take 5 mg by mouth daily.   . insulin aspart (NOVOLOG) 100 UNIT/ML injection Inject 0-5 Units into the skin at bedtime. PLEASE USE LOW DOSE SSI AT snf (Patient taking differently: Inject 3 Units into the skin daily before supper. PLEASE USE LOW DOSE SSI AT snf) 10/27/2017: Patient only taking 3 u prior to pm meal daily  . Insulin Detemir (LEVEMIR FLEXTOUCH) 100 UNIT/ML Pen Inject 10 Units  into the skin daily.    Marland Kitchen letrozole (FEMARA) 2.5 MG tablet Take 1 tablet (2.5 mg total) by mouth daily.   Marland Kitchen losartan (COZAAR) 25 MG tablet Take 50 mg by mouth daily.    . Melatonin 5 MG TABS Take 2 tablets (10 mg total) by mouth at bedtime. (Patient not taking: Reported on 11/23/2017)   . metoprolol succinate (TOPROL-XL) 50 MG 24 hr tablet Take 50 mg by mouth daily.    . multivitamin (RENA-VIT) TABS tablet Take 1 tablet by mouth at bedtime.   Marland Kitchen nystatin cream (MYCOSTATIN) Apply topically 2 (two) times daily.   Marland Kitchen torsemide (DEMADEX) 20 MG tablet Take 1  tablet by mouth daily as needed.    No facility-administered encounter medications on file as of 12/01/2017.     Functional Status:   In your present state of health, do you have any difficulty performing the following activities: 10/01/2017 08/22/2017  Hearing? N -  Vision? N -  Difficulty concentrating or making decisions? N -  Walking or climbing stairs? Y -  Comment related to stamina and strength -  Dressing or bathing? N -  Doing errands, shopping? Y N  Comment related to stamina and strength -  Preparing Food and eating ? N -  Using the Toilet? N -  In the past six months, have you accidently leaked urine? Y -  Do you have problems with loss of bowel control? N -  Managing your Medications? N -  Managing your Finances? N -  Housekeeping or managing your Housekeeping? N -  Some recent data might be hidden    Fall/Depression Screening:    Fall Risk  11/25/2017 11/18/2017 10/21/2017  Falls in the past year? No No No  Comment - - -   PHQ 2/9 Scores 10/21/2017 10/01/2017 01/06/2017  PHQ - 2 Score 0 0 0    Assessment: Alert, pleasant, and well groomed. Patient met RN CM at door upon arrival. Physical assessment benign. Patient able to verbalize correct dosage and time of insulin administration as prescribed. She continues to attend Diabetes Education classes at [facility] and tomorrow will be the last of the series. She continues to check her sugars TID and recording in her [facility] calendar. Fasting sugars over the last 3 days are 114, 117, 120, and before PM meals 117,87,121. States she has learned a lot of helpful information about how to care for herself better related to her DM. She is especially excited about the exercise education she has received as she can do the stretches and weight bearing exercises in her own home. Patient showed RN CM all the education information and personal notes she has taken. Patient appears extremely motivated to improve her health, decrease her A1C, and to loose  weight. Patient also states she has accepted that she may be on insulin the rest of her life but "I am hopeful I can be transitioned to one of those weekly insulin medications in the future". RN CM commended patient for all her efforts to change her health.  Discussed with patient the "next steps" in transitioning from Community Case Management to Chronic Disease Management. Patient understands she has met all her goals and feels she is ready to "graduate" to [employer] program for continued support and education related to DM-self care.  Plan: Community CM will close program and referral will be placed for Chronic Disease Management for ongoing coaching and education related to DM-self care.  Will notify PCP of Community CM program closure  THN CM Care Plan Problem One     Most Recent Value  Care Plan Problem One  DM-self care  Role Documenting the Problem One  Care Management [place] for Problem One  Active  THN Long Term Goal   Patient's A1C will decrease by 0.5 points over the next 60 days as evidenced by self reporting and documentation in EMR  [facility] Long Term Goal Start Date  12/01/17  [facility] Long Term Goal Met Date  12/01/17  Interventions for Problem One Long Term Goal  patient transitioned from community case management to ongoing health coach for continued support and education related to DM-self care  [facility] CM Short Term Goal #1   patient will check and record blood glucose levels BID for the next 30 days  THN CM Short Term Goal #1 Start Date  10/01/17  [facility] CM Short Term Goal #1 Met Date  10/27/17  THN CM Short Term Goal #2   patient will take all medications as prescribed over the next 30 days  THN CM Short Term Goal #2 Start Date  10/27/17 [date reset related to insulin dose change]  THN CM Short Term Goal #2 Met Date  12/01/17     Curtez Brallier E. Rollene Rotunda RN, BSN [facility] Care Management Coordinator [phone]

## 2017-12-02 ENCOUNTER — Encounter: Payer: PPO | Admitting: Dietician

## 2017-12-02 ENCOUNTER — Encounter: Payer: Self-pay | Admitting: *Deleted

## 2017-12-02 ENCOUNTER — Encounter: Payer: Self-pay | Admitting: Dietician

## 2017-12-02 VITALS — Ht 63.0 in | Wt 204.1 lb

## 2017-12-02 DIAGNOSIS — E119 Type 2 diabetes mellitus without complications: Secondary | ICD-10-CM

## 2017-12-02 DIAGNOSIS — Z713 Dietary counseling and surveillance: Secondary | ICD-10-CM | POA: Diagnosis not present

## 2017-12-02 NOTE — Progress Notes (Signed)

## 2017-12-03 ENCOUNTER — Encounter: Payer: Self-pay | Admitting: *Deleted

## 2017-12-06 ENCOUNTER — Ambulatory Visit: Payer: PPO

## 2017-12-15 DIAGNOSIS — Z124 Encounter for screening for malignant neoplasm of cervix: Secondary | ICD-10-CM | POA: Diagnosis not present

## 2017-12-15 DIAGNOSIS — Z01419 Encounter for gynecological examination (general) (routine) without abnormal findings: Secondary | ICD-10-CM | POA: Diagnosis not present

## 2017-12-20 DIAGNOSIS — I10 Essential (primary) hypertension: Secondary | ICD-10-CM | POA: Diagnosis not present

## 2017-12-20 DIAGNOSIS — G4733 Obstructive sleep apnea (adult) (pediatric): Secondary | ICD-10-CM | POA: Diagnosis not present

## 2018-01-03 ENCOUNTER — Other Ambulatory Visit: Payer: Self-pay | Admitting: *Deleted

## 2018-01-03 NOTE — Patient Outreach (Signed)
[place] [facility]) Care Management  01/03/2018  Bonnie Holt March 29, 1945 [id number]   RN Health Coach attempted #1 follow up outreach call to patient.  Patient was unavailable. HIPPA compliance voicemail message left with return callback number.  Plan: RN will call patient again within 10 business days.  [place] Care Management [phone]

## 2018-01-04 IMAGING — US US BREAST*L* LIMITED INC AXILLA
1 series · 13 of 22 positions shown · non-contrast
Comparison: Previous exam(s).

CLINICAL DATA: Patient recalled from screening for bilateral
asymmetries.

EXAM:
DIGITAL DIAGNOSTIC BILATERAL MAMMOGRAM WITH CAD
ULTRASOUND LEFT BREAST

[Series 1: us breast*left* limited inc axilla · 0.06mm/px · 13 of 22 slices shown]
[im 1/22]
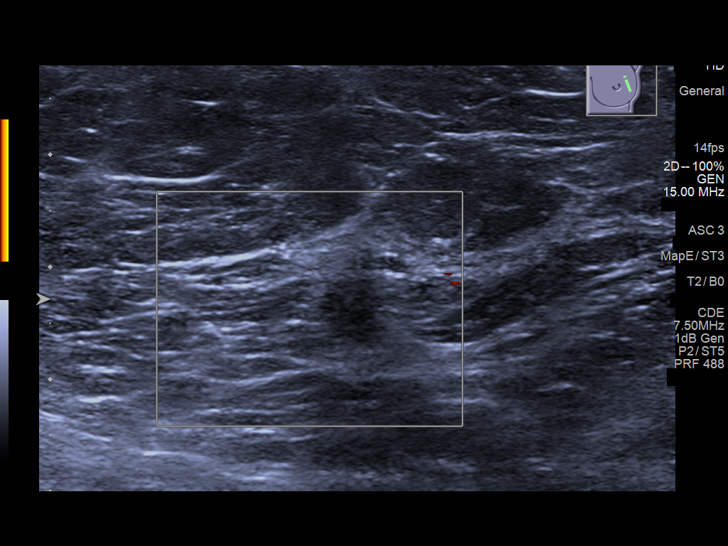
[im 3/22]
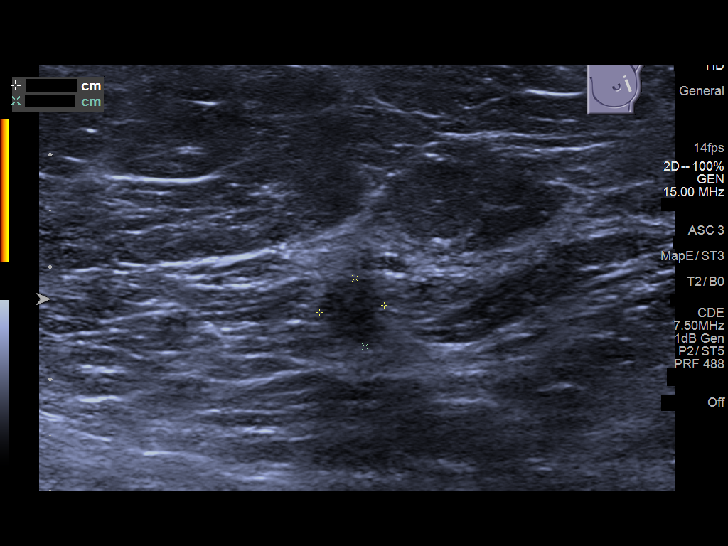
[im 5/22]
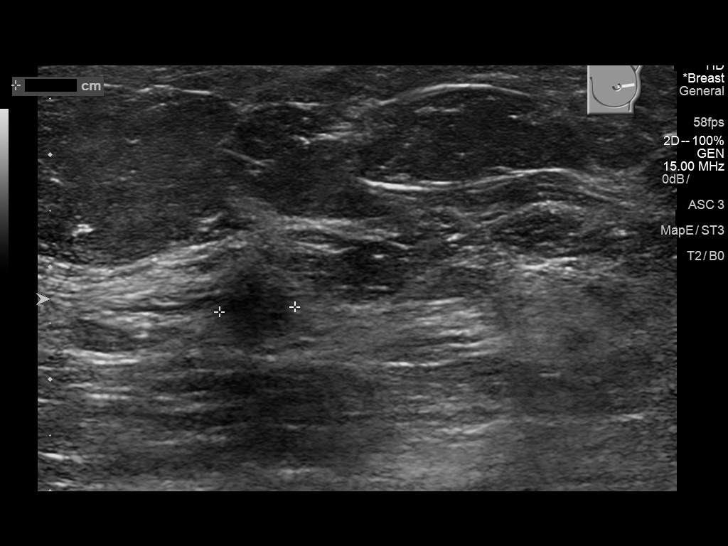
[im 6/22]
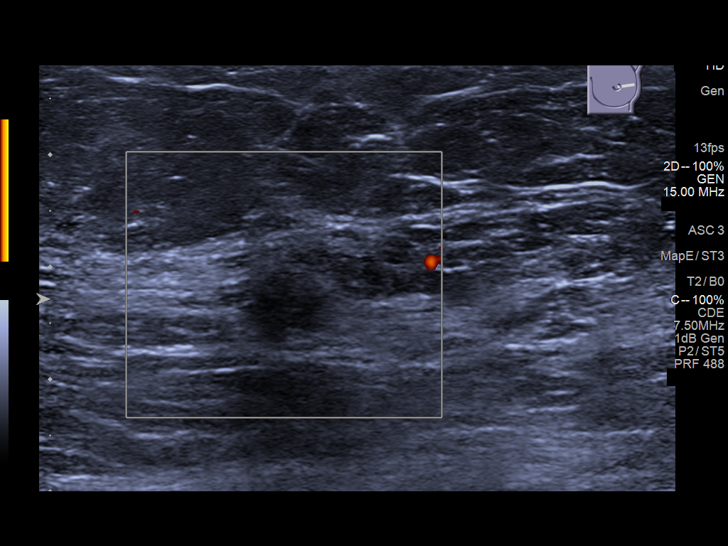
[im 8/22]
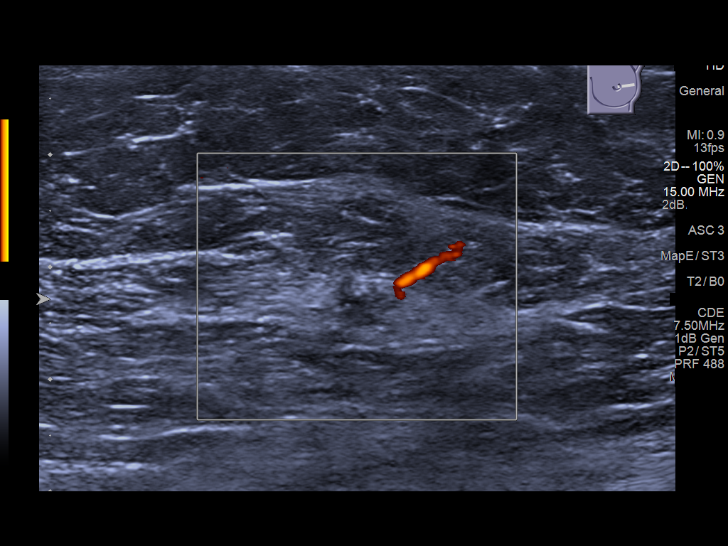
[im 10/22]
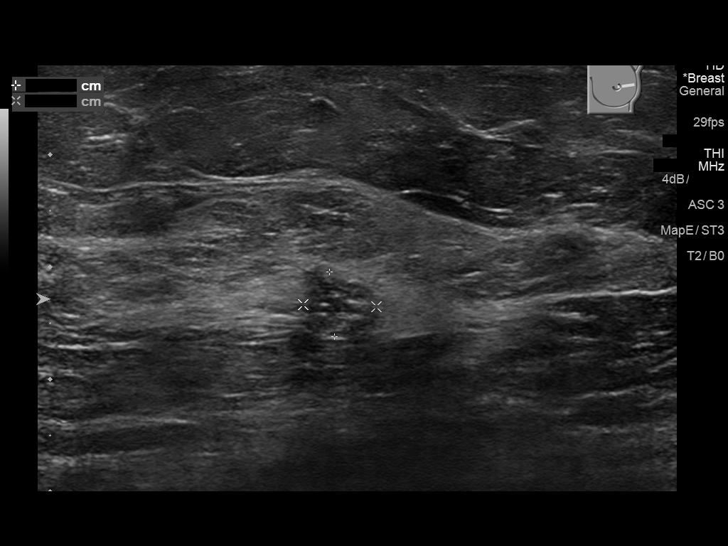
[im 12/22]
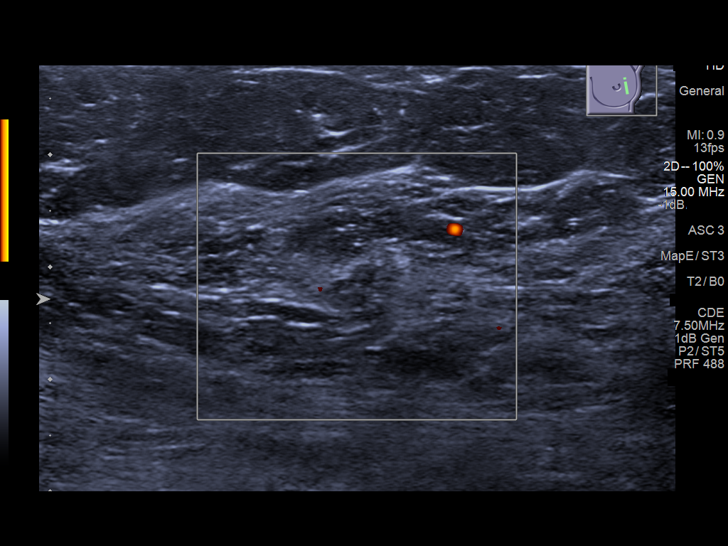
[im 13/22]
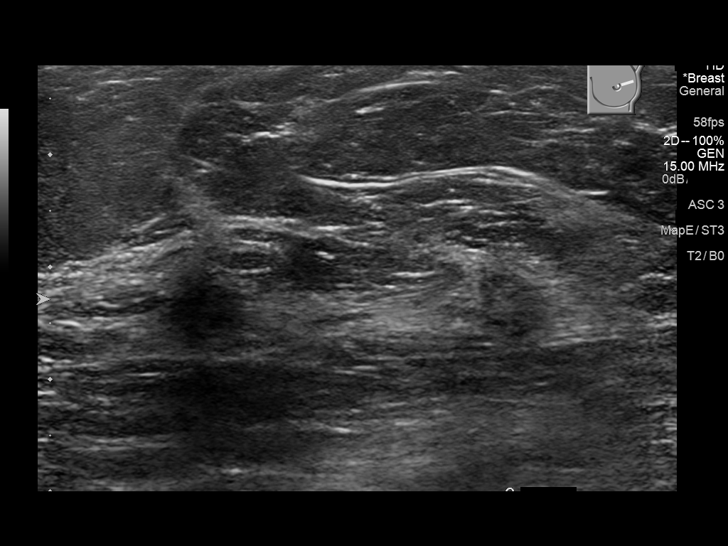
[im 15/22]
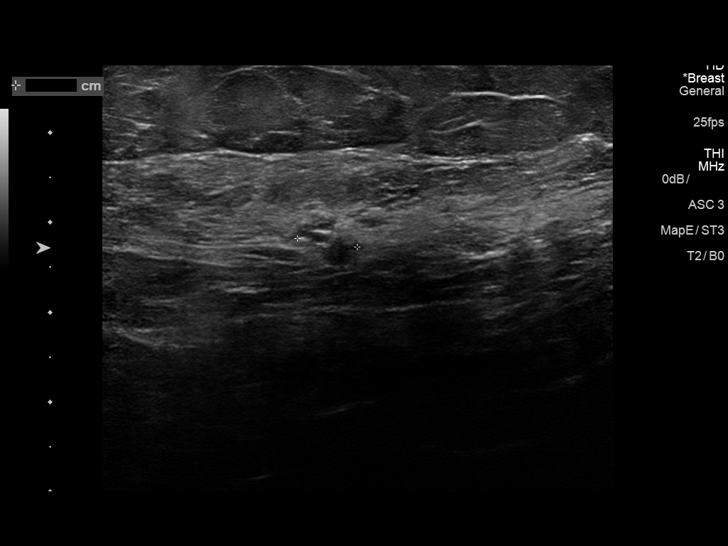
[im 17/22]
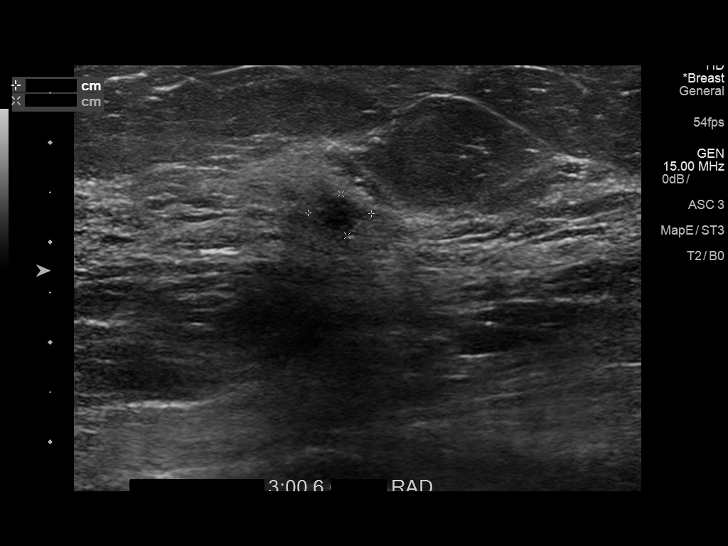
[im 18/22]
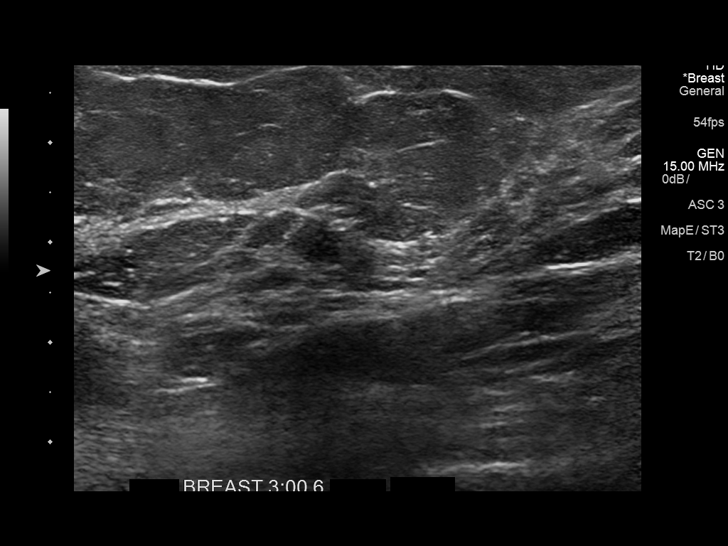
[im 20/22]
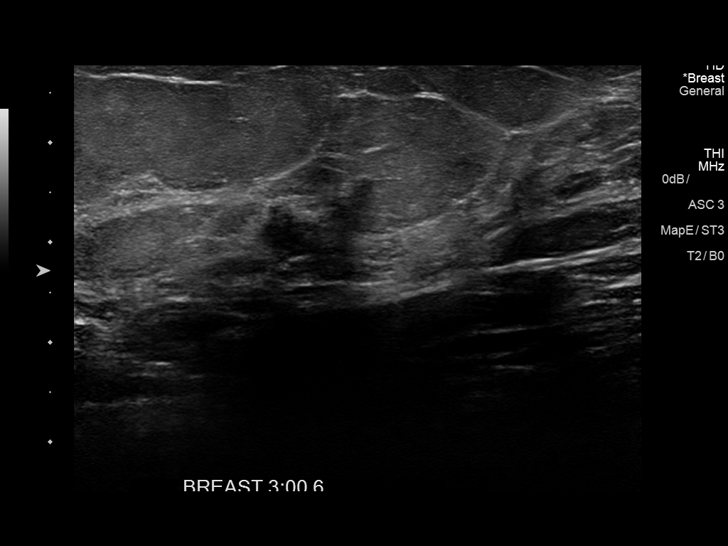
[im 22/22]
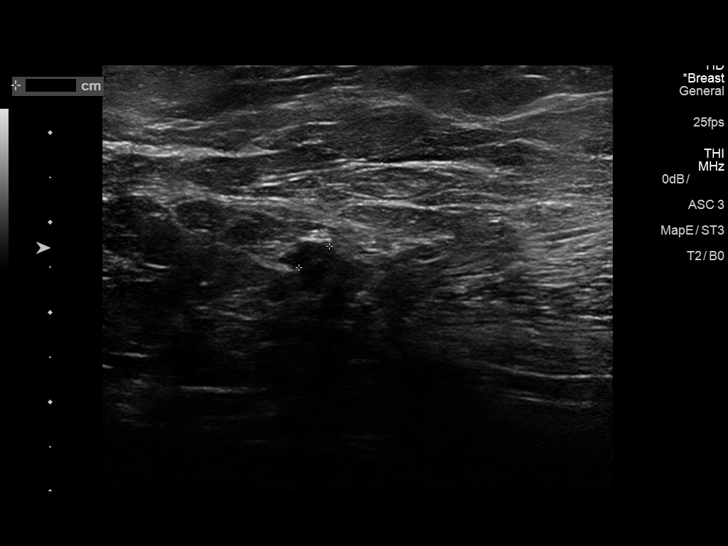

[13 of 22 positions shown; findings below may reference images not displayed]

ACR Breast Density Category c: The breast tissue is heterogeneously
dense, which may obscure small masses.
FINDINGS: Within the lateral aspect of the left breast there are 3 adjacent
asymmetries, demonstrated best on the CC view. Questioned asymmetry
within the right breast on the CC view resolved with additional
imaging compatible with overlapping tissue.

Mammographic images were processed with CAD.

On physical exam, I palpate no discrete mass within the lateral left
breast.

Targeted ultrasound is performed, showing a 6 x 6 x 7 mm irregular
hypoechoic mass left breast 230 o'clock 4 cm from the nipple.

There is a 6 x 7 x 7 mm lobular hypoechoic mass left breast 230
o'clock 6 cm from the nipple.

There is a 6 x 4 x 5 mm lobular hypoechoic mass left breast 3
o'clock position 6 cm from nipple.

Mildly cortically thickened left axillary lymph node measuring 4 mm.
IMPRESSION: Three adjacent lobular hypoechoic masses as described above within
the lateral left breast, felt to correspond with mammographic
asymmetries, demonstrated best on the CC view.

Mildly cortically thickened left axillary lymph node.

RECOMMENDATION:
Ultrasound-guided core needle biopsy of the 3 adjacent lobular
hypoechoic masses within the left breast including be 230 o'clock 6
cm from the nipple, 230 o'clock 4 cm from nipple and 3 o'clock 6 cm
from the nipple.

If these demonstrate malignant pathology, ultrasound-guided biopsy
of the mildly cortically thickened left axillary lymph node would be
warranted. If no malignant pathology identified, consider follow-up
ultrasound of the left axilla in 6 months.

I have discussed the findings and recommendations with the patient.
Results were also provided in writing at the conclusion of the
visit. If applicable, a reminder letter will be sent to the patient
regarding the next appointment.

BI-RADS CATEGORY  4: Suspicious.

## 2018-01-10 ENCOUNTER — Other Ambulatory Visit: Payer: Self-pay | Admitting: *Deleted

## 2018-01-10 NOTE — Patient Outreach (Signed)
[place] [facility]) Care Management  01/10/2018  Bonnie Holt 10/14/1945 [id number]   RN Health Coach attempted follow up outreach call to patient.  Patient was unavailable. HIPPA compliance voicemail message left with return callback number.  Plan: RN will call patient again within 10 business days.  [place] Care Management [phone]

## 2018-01-17 ENCOUNTER — Other Ambulatory Visit: Payer: Self-pay | Admitting: *Deleted

## 2018-01-26 DIAGNOSIS — N179 Acute kidney failure, unspecified: Secondary | ICD-10-CM | POA: Diagnosis not present

## 2018-01-26 DIAGNOSIS — N185 Chronic kidney disease, stage 5: Secondary | ICD-10-CM | POA: Diagnosis not present

## 2018-01-26 DIAGNOSIS — R600 Localized edema: Secondary | ICD-10-CM | POA: Diagnosis not present

## 2018-01-26 DIAGNOSIS — I10 Essential (primary) hypertension: Secondary | ICD-10-CM | POA: Diagnosis not present

## 2018-02-02 DIAGNOSIS — E7800 Pure hypercholesterolemia, unspecified: Secondary | ICD-10-CM | POA: Diagnosis not present

## 2018-02-02 DIAGNOSIS — I25119 Atherosclerotic heart disease of native coronary artery with unspecified angina pectoris: Secondary | ICD-10-CM | POA: Diagnosis not present

## 2018-02-02 DIAGNOSIS — E1129 Type 2 diabetes mellitus with other diabetic kidney complication: Secondary | ICD-10-CM | POA: Diagnosis not present

## 2018-02-02 DIAGNOSIS — R600 Localized edema: Secondary | ICD-10-CM | POA: Diagnosis not present

## 2018-02-02 DIAGNOSIS — I129 Hypertensive chronic kidney disease with stage 1 through stage 4 chronic kidney disease, or unspecified chronic kidney disease: Secondary | ICD-10-CM | POA: Diagnosis not present

## 2018-02-02 DIAGNOSIS — E119 Type 2 diabetes mellitus without complications: Secondary | ICD-10-CM | POA: Diagnosis not present

## 2018-02-02 DIAGNOSIS — N184 Chronic kidney disease, stage 4 (severe): Secondary | ICD-10-CM | POA: Diagnosis not present

## 2018-02-09 DIAGNOSIS — I25119 Atherosclerotic heart disease of native coronary artery with unspecified angina pectoris: Secondary | ICD-10-CM | POA: Diagnosis not present

## 2018-02-09 DIAGNOSIS — I480 Paroxysmal atrial fibrillation: Secondary | ICD-10-CM | POA: Diagnosis not present

## 2018-02-09 DIAGNOSIS — G4733 Obstructive sleep apnea (adult) (pediatric): Secondary | ICD-10-CM | POA: Diagnosis not present

## 2018-02-09 DIAGNOSIS — Z9989 Dependence on other enabling machines and devices: Secondary | ICD-10-CM | POA: Diagnosis not present

## 2018-02-09 DIAGNOSIS — E7800 Pure hypercholesterolemia, unspecified: Secondary | ICD-10-CM | POA: Diagnosis not present

## 2018-02-09 DIAGNOSIS — E1169 Type 2 diabetes mellitus with other specified complication: Secondary | ICD-10-CM | POA: Diagnosis not present

## 2018-02-09 DIAGNOSIS — I10 Essential (primary) hypertension: Secondary | ICD-10-CM | POA: Diagnosis not present

## 2018-02-09 DIAGNOSIS — Z6841 Body Mass Index (BMI) 40.0 and over, adult: Secondary | ICD-10-CM | POA: Diagnosis not present

## 2018-02-11 NOTE — Patient Outreach (Signed)
[facility]) Care Management  [id number]  Bonnie Holt 24-Jul-1945 [id number]  RN Health Coach telephone call to patient.  Hipaa compliance verified. Per patient she is watching her carbs and portion control. Patient stated her highest  blood sugars have been running 155-155. Her fasting today was 114. Patient stated she did get her flu shot in August. Patient next eye exam is this month. Patient next dental check is in December. Per patient she is exercising and walking at home. Patient has agreed to follow up out reach calls.   Current Medications:  Current Outpatient Medications  Medication Sig Dispense Refill  . acetaminophen (TYLENOL) 325 MG tablet Place 2 tablets (650 mg total) into feeding tube every 6 (six) hours as needed for mild pain (or Fever >/= 101). (Patient not taking: Reported on 11/23/2017)    . apixaban (ELIQUIS) 5 MG TABS tablet Take 5 mg by mouth 2 (two) times daily.    Marland Kitchen atorvastatin (LIPITOR) 80 MG tablet Take 1 tablet by mouth daily.   11  . BORIC ACID EX Place vaginally.    . calcium citrate-vitamin D (CITRACAL+D) 315-200 MG-UNIT tablet Take 1 tablet by mouth daily.    . chlorhexidine (PERIDEX) 0.12 % solution 15 mLs by Mouth Rinse route 2 (two) times daily. 120 mL 0  . Cholecalciferol (VITAMIN D-3) 5000 units TABS Take 1 tablet by mouth daily.     Marland Kitchen CINNAMON PO Take 1 capsule by mouth daily.    . clopidogrel (PLAVIX) 75 MG tablet TAKE 1 TABLET BY MOUTH ONCE DAILY    . Coenzyme Q10 (COQ10 PO) Take 1 tablet by mouth daily.    Marland Kitchen estradiol (ESTRACE) 0.1 MG/GM vaginal cream   3  . Flaxseed, Linseed, (FLAXSEED OIL) 1200 MG CAPS Take 1 capsule by mouth daily.    Marland Kitchen glipiZIDE (GLUCOTROL) 5 MG tablet Take 5 mg by mouth daily.    . insulin aspart (NOVOLOG) 100 UNIT/ML injection Inject 0-5 Units into the skin at bedtime. PLEASE USE LOW DOSE SSI AT snf (Patient taking differently: Inject 3 Units into the skin daily before supper. PLEASE USE LOW DOSE SSI AT snf)  10 mL 11  . Insulin Detemir (LEVEMIR FLEXTOUCH) 100 UNIT/ML Pen Inject 10 Units into the skin daily.     Marland Kitchen letrozole (FEMARA) 2.5 MG tablet Take 1 tablet (2.5 mg total) by mouth daily. 30 tablet 11  . losartan (COZAAR) 25 MG tablet Take 50 mg by mouth daily.     . Melatonin 5 MG TABS Take 2 tablets (10 mg total) by mouth at bedtime. (Patient not taking: Reported on 11/23/2017)  0  . metoprolol succinate (TOPROL-XL) 50 MG 24 hr tablet Take 50 mg by mouth daily.     . multivitamin (RENA-VIT) TABS tablet Take 1 tablet by mouth at bedtime.  0  . nystatin cream (MYCOSTATIN) Apply topically 2 (two) times daily. 30 g 0  . torsemide (DEMADEX) 20 MG tablet Take 1 tablet by mouth daily as needed.  3   No current facility-administered medications for this visit.     Functional Status:  In your present state of health, do you have any difficulty performing the following activities: 10/01/2017 08/22/2017  Hearing? N -  Vision? N -  Difficulty concentrating or making decisions? N -  Walking or climbing stairs? Y -  Comment related to stamina and strength -  Dressing or bathing? N -  Doing errands, shopping? Y N  Comment related to stamina and strength -  Preparing Food and eating ? N -  Using the Toilet? N -  In the past six months, have you accidently leaked urine? Y -  Do you have problems with loss of bowel control? N -  Managing your Medications? N -  Managing your Finances? N -  Housekeeping or managing your Housekeeping? N -  Some recent data might be hidden    Fall/Depression Screening: Fall Risk  12/02/2017 11/25/2017 11/18/2017  Falls in the past year? No No No  Comment - - -   PHQ 2/9 Scores 10/21/2017 10/01/2017 01/06/2017  PHQ - 2 Score 0 0 0   THN CM Care Plan Problem One     Most Recent Value  Care Plan Problem One  Knowledge Deficit in self management of Diabetes  Role Documenting the Problem One  [place] for Problem One  Active  THN Long Term Goal   Patient will  see a decrease in A1C from 8.0 within the next 90 days  Interventions for Problem One Long Term Goal  RN discussed FBS needs to be 80-130 to see an A1C less thatn 7. Patient is verbalizing understanding and is modifying her diet. RN will follow up for further discussion and teach back  THN CM Short Term Goal #1   Patient will verbalize a better understanding of low carb snacks within the next 30 days  THN CM Short Term Goal #1 Start Date  01/17/18  Interventions for Short Term Goal #1  RN discussed the importance of eating low carb snacks between. RN sent a list of healthy low carb foods. RN willfollow up with further discussion and teach back.  THN CM Short Term Goal #2   Patient will verbalize receiveing diabetic dine out menu for better choices within the next 30 days  THN CM Short Term Goal #2 Start Date  01/17/18  Interventions for Short Term Goal #2  RN discussed with patient about making healthy food selections when dining out. RN sent a dining out menu for diabetics to fast food restaurants. RN will follow up with further discussion and teach back.        Assessment:  Fasting blood sugar 114 Highest fasting blood sugar runs between 154-155 Patient was 247 pounds and is now 196.2 Patient has gotten flu shot Patient has scheduled dental exam in December Patient is eating more healthier     Plan:  Dental exam every 6 months RN discussed fasting blood sugar and progress RN sent Dining out menu 2020 Calendar sent List of Healthy Snacks Patient will check benefits of free style [name] RN will follow up within the month of January   [facility] Management [phone]

## 2018-02-12 DIAGNOSIS — G4733 Obstructive sleep apnea (adult) (pediatric): Secondary | ICD-10-CM | POA: Diagnosis not present

## 2018-02-12 DIAGNOSIS — I10 Essential (primary) hypertension: Secondary | ICD-10-CM | POA: Diagnosis not present

## 2018-02-28 DIAGNOSIS — E089 Diabetes mellitus due to underlying condition without complications: Secondary | ICD-10-CM | POA: Diagnosis not present

## 2018-02-28 DIAGNOSIS — H25013 Cortical age-related cataract, bilateral: Secondary | ICD-10-CM | POA: Diagnosis not present

## 2018-02-28 DIAGNOSIS — H524 Presbyopia: Secondary | ICD-10-CM | POA: Diagnosis not present

## 2018-02-28 DIAGNOSIS — H2513 Age-related nuclear cataract, bilateral: Secondary | ICD-10-CM | POA: Diagnosis not present

## 2018-02-28 DIAGNOSIS — E119 Type 2 diabetes mellitus without complications: Secondary | ICD-10-CM | POA: Diagnosis not present

## 2018-03-29 ENCOUNTER — Other Ambulatory Visit: Payer: Self-pay | Admitting: *Deleted

## 2018-03-29 NOTE — Patient Outreach (Signed)
[place] [facility]) Care Management  03/29/2018  Bonnie Holt 1946-02-09 [id number]   RN Health Coach attempted follow up outreach call to patient.  Patient was unavailable. HIPPA compliance voicemail message left with return callback number.  Plan: RN will call patient again within 30 days.  [place] Care Management [phone]

## 2018-03-29 NOTE — Patient Outreach (Signed)
[place] [facility]) Care Management  03/29/2018   Bonnie Holt 28-Dec-1945 [id number]  RN Health Coach received return telephone call from patient.  Hipaa compliance verified. Patient fasting blood sugar was 78 today. Patient immediately ate after checking. Patient was not symptomatic at that time.  Patient has a new free style Libre. She stated that she is loving it and is checking her blood sugars three times a day. Patient is exercising 3 times a week walking. Patient is up to date on eye exam, and Dental exam. Patient is very receptive to outreach calls and agreeing to follow up outreach.  Current Medications:  Current Outpatient Medications  Medication Sig Dispense Refill  . acetaminophen (TYLENOL) 325 MG tablet Place 2 tablets (650 mg total) into feeding tube every 6 (six) hours as needed for mild pain (or Fever >/= 101). (Patient not taking: Reported on 11/23/2017)    . apixaban (ELIQUIS) 5 MG TABS tablet Take 5 mg by mouth 2 (two) times daily.    Marland Kitchen atorvastatin (LIPITOR) 80 MG tablet Take 1 tablet by mouth daily.   11  . BORIC ACID EX Place vaginally.    . calcium citrate-vitamin D (CITRACAL+D) 315-200 MG-UNIT tablet Take 1 tablet by mouth daily.    . chlorhexidine (PERIDEX) 0.12 % solution 15 mLs by Mouth Rinse route 2 (two) times daily. 120 mL 0  . Cholecalciferol (VITAMIN D-3) 5000 units TABS Take 1 tablet by mouth daily.     Marland Kitchen CINNAMON PO Take 1 capsule by mouth daily.    . clopidogrel (PLAVIX) 75 MG tablet TAKE 1 TABLET BY MOUTH ONCE DAILY    . Coenzyme Q10 (COQ10 PO) Take 1 tablet by mouth daily.    Marland Kitchen estradiol (ESTRACE) 0.1 MG/GM vaginal cream   3  . Flaxseed, Linseed, (FLAXSEED OIL) 1200 MG CAPS Take 1 capsule by mouth daily.    Marland Kitchen glipiZIDE (GLUCOTROL) 5 MG tablet Take 5 mg by mouth daily.    . insulin aspart (NOVOLOG) 100 UNIT/ML injection Inject 0-5 Units into the skin at bedtime. PLEASE USE LOW DOSE SSI AT snf (Patient taking differently: Inject 3 Units into the  skin daily before supper. PLEASE USE LOW DOSE SSI AT snf) 10 mL 11  . Insulin Detemir (LEVEMIR FLEXTOUCH) 100 UNIT/ML Pen Inject 10 Units into the skin daily.     Marland Kitchen letrozole (FEMARA) 2.5 MG tablet Take 1 tablet (2.5 mg total) by mouth daily. 30 tablet 11  . losartan (COZAAR) 25 MG tablet Take 50 mg by mouth daily.     . Melatonin 5 MG TABS Take 2 tablets (10 mg total) by mouth at bedtime. (Patient not taking: Reported on 11/23/2017)  0  . metoprolol succinate (TOPROL-XL) 50 MG 24 hr tablet Take 50 mg by mouth daily.     . multivitamin (RENA-VIT) TABS tablet Take 1 tablet by mouth at bedtime.  0  . nystatin cream (MYCOSTATIN) Apply topically 2 (two) times daily. 30 g 0  . torsemide (DEMADEX) 20 MG tablet Take 1 tablet by mouth daily as needed.  3   No current facility-administered medications for this visit.     Functional Status:  In your present state of health, do you have any difficulty performing the following activities: 03/29/2018 10/01/2017  Hearing? N N  Vision? N N  Difficulty concentrating or making decisions? N N  Walking or climbing stairs? Y Y  Comment - related to stamina and strength  Dressing or bathing? N N  Doing errands, shopping?  Y Y  Comment - related to stamina and Location manager and eating ? N N  Using the Toilet? N N  In the past six months, have you accidently leaked urine? Y Y  Do you have problems with loss of bowel control? N N  Managing your Medications? N N  Managing your Finances? N N  Housekeeping or managing your Housekeeping? N N  Some recent data might be hidden    Fall/Depression Screening: Fall Risk  03/29/2018 12/02/2017 11/25/2017  Falls in the past year? 0 No No  Comment - - -  Risk for fall due to : Impaired balance/gait - -   PHQ 2/9 Scores 03/29/2018 10/21/2017 10/01/2017 01/06/2017  PHQ - 2 Score 0 0 0 0    Assessment:  Fasting blood sugar is 78 A1C 5.6 from 8.0 Patient has a free style libre Exercise routine  Eating healthy  and following diet  Plan:  Patient will continue exercise routine Patient will continue making healthier food choices Cardiology appointment 04/05/2018 Next A1C 06/03/2018 PCP appointment 06/10/2018 Gyn Appointment 12/20/2018 RN sent EMMI on Diabetes Taking care of yourself day to day RN sent EMMI on Diabetes Taking care of yourself throughout the year RN will follow up within the month of April  [place] Management [phone]

## 2018-04-05 DIAGNOSIS — I208 Other forms of angina pectoris: Secondary | ICD-10-CM | POA: Diagnosis not present

## 2018-04-05 DIAGNOSIS — I480 Paroxysmal atrial fibrillation: Secondary | ICD-10-CM | POA: Diagnosis not present

## 2018-04-05 DIAGNOSIS — Z6841 Body Mass Index (BMI) 40.0 and over, adult: Secondary | ICD-10-CM | POA: Diagnosis not present

## 2018-04-05 DIAGNOSIS — I25119 Atherosclerotic heart disease of native coronary artery with unspecified angina pectoris: Secondary | ICD-10-CM | POA: Diagnosis not present

## 2018-04-05 DIAGNOSIS — E1169 Type 2 diabetes mellitus with other specified complication: Secondary | ICD-10-CM | POA: Diagnosis not present

## 2018-04-05 DIAGNOSIS — I10 Essential (primary) hypertension: Secondary | ICD-10-CM | POA: Diagnosis not present

## 2018-04-05 DIAGNOSIS — R0602 Shortness of breath: Secondary | ICD-10-CM | POA: Diagnosis not present

## 2018-04-05 DIAGNOSIS — G4733 Obstructive sleep apnea (adult) (pediatric): Secondary | ICD-10-CM | POA: Diagnosis not present

## 2018-04-05 DIAGNOSIS — Z9989 Dependence on other enabling machines and devices: Secondary | ICD-10-CM | POA: Diagnosis not present

## 2018-04-05 DIAGNOSIS — E7800 Pure hypercholesterolemia, unspecified: Secondary | ICD-10-CM | POA: Diagnosis not present

## 2018-04-08 DIAGNOSIS — M1712 Unilateral primary osteoarthritis, left knee: Secondary | ICD-10-CM | POA: Diagnosis not present

## 2018-04-08 DIAGNOSIS — G4733 Obstructive sleep apnea (adult) (pediatric): Secondary | ICD-10-CM | POA: Diagnosis not present

## 2018-04-08 DIAGNOSIS — I10 Essential (primary) hypertension: Secondary | ICD-10-CM | POA: Diagnosis not present

## 2018-05-13 DIAGNOSIS — I10 Essential (primary) hypertension: Secondary | ICD-10-CM | POA: Diagnosis not present

## 2018-05-13 DIAGNOSIS — G4733 Obstructive sleep apnea (adult) (pediatric): Secondary | ICD-10-CM | POA: Diagnosis not present
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
## 2018-05-24 ENCOUNTER — Other Ambulatory Visit: Payer: Self-pay

## 2018-05-24 ENCOUNTER — Encounter: Payer: Self-pay | Admitting: Oncology

## 2018-05-24 ENCOUNTER — Inpatient Hospital Stay: Payer: PPO | Attending: Oncology | Admitting: Oncology

## 2018-05-24 VITALS — BP 118/73 | HR 61 | Temp 97.6°F | Resp 20 | Ht 63.0 in | Wt 206.7 lb

## 2018-05-24 DIAGNOSIS — Z79811 Long term (current) use of aromatase inhibitors: Secondary | ICD-10-CM | POA: Diagnosis not present

## 2018-05-24 DIAGNOSIS — Z170 Estrogen receptor positive status [ER+]: Secondary | ICD-10-CM | POA: Insufficient documentation

## 2018-05-24 DIAGNOSIS — M8580 Other specified disorders of bone density and structure, unspecified site: Secondary | ICD-10-CM | POA: Diagnosis not present

## 2018-05-24 DIAGNOSIS — C50412 Malignant neoplasm of upper-outer quadrant of left female breast: Secondary | ICD-10-CM | POA: Diagnosis not present

## 2018-05-27 DIAGNOSIS — R600 Localized edema: Secondary | ICD-10-CM | POA: Diagnosis not present

## 2018-05-27 DIAGNOSIS — E1129 Type 2 diabetes mellitus with other diabetic kidney complication: Secondary | ICD-10-CM | POA: Diagnosis not present

## 2018-05-27 DIAGNOSIS — L728 Other follicular cysts of the skin and subcutaneous tissue: Secondary | ICD-10-CM | POA: Diagnosis not present

## 2018-05-27 DIAGNOSIS — D2272 Melanocytic nevi of left lower limb, including hip: Secondary | ICD-10-CM | POA: Diagnosis not present

## 2018-05-27 DIAGNOSIS — D2261 Melanocytic nevi of right upper limb, including shoulder: Secondary | ICD-10-CM | POA: Diagnosis not present

## 2018-05-27 DIAGNOSIS — D225 Melanocytic nevi of trunk: Secondary | ICD-10-CM | POA: Diagnosis not present

## 2018-05-27 DIAGNOSIS — L538 Other specified erythematous conditions: Secondary | ICD-10-CM | POA: Diagnosis not present

## 2018-05-27 DIAGNOSIS — Z08 Encounter for follow-up examination after completed treatment for malignant neoplasm: Secondary | ICD-10-CM | POA: Diagnosis not present

## 2018-05-27 DIAGNOSIS — N184 Chronic kidney disease, stage 4 (severe): Secondary | ICD-10-CM | POA: Diagnosis not present

## 2018-05-27 DIAGNOSIS — D2271 Melanocytic nevi of right lower limb, including hip: Secondary | ICD-10-CM | POA: Diagnosis not present

## 2018-05-27 DIAGNOSIS — I10 Essential (primary) hypertension: Secondary | ICD-10-CM | POA: Diagnosis not present

## 2018-05-27 DIAGNOSIS — L820 Inflamed seborrheic keratosis: Secondary | ICD-10-CM | POA: Diagnosis not present

## 2018-05-27 DIAGNOSIS — Z85828 Personal history of other malignant neoplasm of skin: Secondary | ICD-10-CM | POA: Diagnosis not present

## 2018-05-27 DIAGNOSIS — D2262 Melanocytic nevi of left upper limb, including shoulder: Secondary | ICD-10-CM | POA: Diagnosis not present

## 2018-05-27 DIAGNOSIS — L821 Other seborrheic keratosis: Secondary | ICD-10-CM | POA: Diagnosis not present

## 2018-06-01 DIAGNOSIS — R600 Localized edema: Secondary | ICD-10-CM | POA: Diagnosis not present

## 2018-06-01 DIAGNOSIS — N184 Chronic kidney disease, stage 4 (severe): Secondary | ICD-10-CM | POA: Diagnosis not present

## 2018-06-01 DIAGNOSIS — E1129 Type 2 diabetes mellitus with other diabetic kidney complication: Secondary | ICD-10-CM | POA: Diagnosis not present

## 2018-06-01 DIAGNOSIS — I129 Hypertensive chronic kidney disease with stage 1 through stage 4 chronic kidney disease, or unspecified chronic kidney disease: Secondary | ICD-10-CM | POA: Diagnosis not present

## 2018-06-23 DIAGNOSIS — G4733 Obstructive sleep apnea (adult) (pediatric): Secondary | ICD-10-CM | POA: Diagnosis not present

## 2018-06-23 DIAGNOSIS — C61 Malignant neoplasm of prostate: Secondary | ICD-10-CM | POA: Diagnosis not present

## 2018-06-23 DIAGNOSIS — I10 Essential (primary) hypertension: Secondary | ICD-10-CM | POA: Diagnosis not present

## 2018-06-29 ENCOUNTER — Ambulatory Visit: Payer: Self-pay | Admitting: *Deleted

## 2018-07-08 ENCOUNTER — Ambulatory Visit: Payer: Self-pay | Admitting: *Deleted

## 2018-07-08 ENCOUNTER — Other Ambulatory Visit: Payer: Self-pay | Admitting: *Deleted

## 2018-08-05 DIAGNOSIS — E1169 Type 2 diabetes mellitus with other specified complication: Secondary | ICD-10-CM | POA: Diagnosis not present

## 2018-08-05 DIAGNOSIS — Z6841 Body Mass Index (BMI) 40.0 and over, adult: Secondary | ICD-10-CM | POA: Diagnosis not present

## 2018-08-05 DIAGNOSIS — I25119 Atherosclerotic heart disease of native coronary artery with unspecified angina pectoris: Secondary | ICD-10-CM | POA: Diagnosis not present

## 2018-08-05 DIAGNOSIS — E7800 Pure hypercholesterolemia, unspecified: Secondary | ICD-10-CM | POA: Diagnosis not present

## 2018-08-05 DIAGNOSIS — I10 Essential (primary) hypertension: Secondary | ICD-10-CM | POA: Diagnosis not present

## 2018-08-12 DIAGNOSIS — I480 Paroxysmal atrial fibrillation: Secondary | ICD-10-CM | POA: Diagnosis not present

## 2018-08-12 DIAGNOSIS — I25119 Atherosclerotic heart disease of native coronary artery with unspecified angina pectoris: Secondary | ICD-10-CM | POA: Diagnosis not present

## 2018-08-12 DIAGNOSIS — I10 Essential (primary) hypertension: Secondary | ICD-10-CM | POA: Diagnosis not present

## 2018-08-12 DIAGNOSIS — Z9989 Dependence on other enabling machines and devices: Secondary | ICD-10-CM | POA: Diagnosis not present

## 2018-08-12 DIAGNOSIS — Z6841 Body Mass Index (BMI) 40.0 and over, adult: Secondary | ICD-10-CM | POA: Diagnosis not present

## 2018-08-12 DIAGNOSIS — E7800 Pure hypercholesterolemia, unspecified: Secondary | ICD-10-CM | POA: Diagnosis not present

## 2018-08-12 DIAGNOSIS — E1122 Type 2 diabetes mellitus with diabetic chronic kidney disease: Secondary | ICD-10-CM | POA: Diagnosis not present

## 2018-08-12 DIAGNOSIS — N184 Chronic kidney disease, stage 4 (severe): Secondary | ICD-10-CM | POA: Diagnosis not present

## 2018-08-12 DIAGNOSIS — G4733 Obstructive sleep apnea (adult) (pediatric): Secondary | ICD-10-CM | POA: Diagnosis not present

## 2018-09-29 DIAGNOSIS — R600 Localized edema: Secondary | ICD-10-CM | POA: Diagnosis not present

## 2018-09-29 DIAGNOSIS — N184 Chronic kidney disease, stage 4 (severe): Secondary | ICD-10-CM | POA: Diagnosis not present

## 2018-09-29 DIAGNOSIS — I10 Essential (primary) hypertension: Secondary | ICD-10-CM | POA: Diagnosis not present

## 2018-09-29 DIAGNOSIS — E1129 Type 2 diabetes mellitus with other diabetic kidney complication: Secondary | ICD-10-CM | POA: Diagnosis not present

## 2018-10-03 DIAGNOSIS — I2510 Atherosclerotic heart disease of native coronary artery without angina pectoris: Secondary | ICD-10-CM | POA: Diagnosis not present

## 2018-10-03 DIAGNOSIS — I208 Other forms of angina pectoris: Secondary | ICD-10-CM | POA: Diagnosis not present

## 2018-10-03 DIAGNOSIS — I10 Essential (primary) hypertension: Secondary | ICD-10-CM | POA: Diagnosis not present

## 2018-10-03 DIAGNOSIS — Z9989 Dependence on other enabling machines and devices: Secondary | ICD-10-CM | POA: Diagnosis not present

## 2018-10-03 DIAGNOSIS — I480 Paroxysmal atrial fibrillation: Secondary | ICD-10-CM | POA: Diagnosis not present

## 2018-10-03 DIAGNOSIS — R0602 Shortness of breath: Secondary | ICD-10-CM | POA: Diagnosis not present

## 2018-10-03 DIAGNOSIS — I25119 Atherosclerotic heart disease of native coronary artery with unspecified angina pectoris: Secondary | ICD-10-CM | POA: Diagnosis not present

## 2018-10-03 DIAGNOSIS — G4733 Obstructive sleep apnea (adult) (pediatric): Secondary | ICD-10-CM | POA: Diagnosis not present

## 2018-10-03 DIAGNOSIS — E1169 Type 2 diabetes mellitus with other specified complication: Secondary | ICD-10-CM | POA: Diagnosis not present

## 2018-10-03 DIAGNOSIS — Z6841 Body Mass Index (BMI) 40.0 and over, adult: Secondary | ICD-10-CM | POA: Diagnosis not present

## 2018-10-03 DIAGNOSIS — E7800 Pure hypercholesterolemia, unspecified: Secondary | ICD-10-CM | POA: Diagnosis not present

## 2018-10-06 DIAGNOSIS — E1129 Type 2 diabetes mellitus with other diabetic kidney complication: Secondary | ICD-10-CM | POA: Diagnosis not present

## 2018-10-06 DIAGNOSIS — I129 Hypertensive chronic kidney disease with stage 1 through stage 4 chronic kidney disease, or unspecified chronic kidney disease: Secondary | ICD-10-CM | POA: Diagnosis not present

## 2018-10-06 DIAGNOSIS — R600 Localized edema: Secondary | ICD-10-CM | POA: Diagnosis not present

## 2018-10-06 DIAGNOSIS — N184 Chronic kidney disease, stage 4 (severe): Secondary | ICD-10-CM | POA: Diagnosis not present

## 2018-10-20 ENCOUNTER — Other Ambulatory Visit: Payer: Self-pay

## 2018-10-20 ENCOUNTER — Encounter: Payer: Self-pay | Admitting: Radiation Oncology

## 2018-10-20 ENCOUNTER — Ambulatory Visit
Admission: RE | Admit: 2018-10-20 | Discharge: 2018-10-20 | Disposition: A | Discharge status: Home / Self Care | Payer: PPO | Source: Ambulatory Visit | Attending: Radiation Oncology | Admitting: Radiation Oncology

## 2018-10-20 VITALS — BP 125/70 | HR 64 | Temp 96.7°F | Resp 18 | Wt 216.0 lb

## 2018-10-20 DIAGNOSIS — Z170 Estrogen receptor positive status [ER+]: Secondary | ICD-10-CM | POA: Diagnosis not present

## 2018-10-20 DIAGNOSIS — C50412 Malignant neoplasm of upper-outer quadrant of left female breast: Secondary | ICD-10-CM | POA: Diagnosis not present

## 2018-10-20 NOTE — Progress Notes (Signed)
Radiation Oncology Follow up Note  Name: Bonnie Holt   Date:   10/20/2018 MRN:  [id number] DOB: 12-Jul-1945    This 73 y.o. female presents to the clinic today for 1-1/2-year follow-up status post whole breast radiation to her left breast for stage I ER PR positive invasive mammary carcinoma.  REFERRING PROVIDER: Kirk Ruths, MD  HPI: Patient is a 73 year old female now about a year and a half having completed whole breast radiation to her left breast for stage I ER PR positive invasive mammary carcinoma.  Seen today in routine follow-up she is doing well.  She specifically denies breast tenderness cough or bone pain..  She is currently on letrozole tolerating that well without side effect.  She is scheduled for mammography the end of this month have asked her to copy me those films for my review.  COMPLICATIONS OF TREATMENT: none  FOLLOW UP COMPLIANCE: keeps appointments   PHYSICAL EXAM:  BP 125/70   Pulse 64   Temp (!) 96.7 F (35.9 C)   Resp 18   Wt 216 lb (98 kg)   BMI 38.26 kg/m  Lungs are clear to A&P cardiac examination essentially unremarkable with regular rate and rhythm. No dominant mass or nodularity is noted in either breast in 2 positions examined. Incision is well-healed. No axillary or supraclavicular adenopathy is appreciated. Cosmetic result is excellent.  Well-developed well-nourished patient in NAD. HEENT reveals PERLA, EOMI, discs not visualized.  Oral cavity is clear. No oral mucosal lesions are identified. Neck is clear without evidence of cervical or supraclavicular adenopathy. Lungs are clear to A&P. Cardiac examination is essentially unremarkable with regular rate and rhythm without murmur rub or thrill. Abdomen is benign with no organomegaly or masses noted. Motor sensory and DTR levels are equal and symmetric in the upper and lower extremities. Cranial nerves II through XII are grossly intact. Proprioception is intact. No peripheral adenopathy or edema is  identified. No motor or sensory levels are noted. Crude visual fields are within normal range.  RADIOLOGY RESULTS: Mammograms will be be reviewed when they are available.  PLAN: Present time patient is doing well with no evidence of disease a year and a half out.  Of asked to see her back in 1 year for follow-up.  We will review her mammograms in a few weeks when they are available.  She continues on letrozole without side effect.  Patient knows to call with any concerns.  I would like to take this opportunity to thank you for allowing me to participate in the care of your patient.Noreene Filbert, MD

## 2018-11-14 ENCOUNTER — Ambulatory Visit
Admission: RE | Admit: 2018-11-14 | Discharge: 2018-11-14 | Disposition: A | Discharge status: Home / Self Care | Payer: PPO | Source: Ambulatory Visit | Attending: Oncology | Admitting: Oncology

## 2018-11-14 DIAGNOSIS — C50412 Malignant neoplasm of upper-outer quadrant of left female breast: Secondary | ICD-10-CM | POA: Diagnosis not present

## 2018-11-14 DIAGNOSIS — M85852 Other specified disorders of bone density and structure, left thigh: Secondary | ICD-10-CM | POA: Diagnosis not present

## 2018-11-14 DIAGNOSIS — Z780 Asymptomatic menopausal state: Secondary | ICD-10-CM | POA: Diagnosis not present

## 2018-11-14 DIAGNOSIS — R922 Inconclusive mammogram: Secondary | ICD-10-CM | POA: Diagnosis not present

## 2018-11-14 DIAGNOSIS — M8589 Other specified disorders of bone density and structure, multiple sites: Secondary | ICD-10-CM | POA: Diagnosis not present
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
## 2018-12-01 ENCOUNTER — Encounter: Payer: Self-pay | Admitting: Oncology

## 2018-12-01 ENCOUNTER — Other Ambulatory Visit: Payer: Self-pay

## 2018-12-01 NOTE — Progress Notes (Signed)
Patient pre screened for office appointment, no questions or concerns today. Patient is no longer taking Eliquis she has been prescribed Pradaxa and is currently taking a free sample. I did not add it to list as she does not have a prescription entered for it.

## 2018-12-02 ENCOUNTER — Other Ambulatory Visit: Payer: Self-pay

## 2018-12-02 ENCOUNTER — Inpatient Hospital Stay: Payer: PPO | Attending: Oncology | Admitting: Oncology

## 2018-12-02 VITALS — BP 136/65 | HR 59 | Temp 97.7°F | Wt 221.0 lb

## 2018-12-02 DIAGNOSIS — C50412 Malignant neoplasm of upper-outer quadrant of left female breast: Secondary | ICD-10-CM | POA: Insufficient documentation

## 2018-12-02 DIAGNOSIS — Z170 Estrogen receptor positive status [ER+]: Secondary | ICD-10-CM | POA: Insufficient documentation

## 2018-12-02 DIAGNOSIS — M8580 Other specified disorders of bone density and structure, unspecified site: Secondary | ICD-10-CM | POA: Insufficient documentation

## 2018-12-02 DIAGNOSIS — Z79811 Long term (current) use of aromatase inhibitors: Secondary | ICD-10-CM | POA: Insufficient documentation

## 2018-12-02 NOTE — Progress Notes (Signed)
Patient here today for follow up.   

## 2018-12-05 DIAGNOSIS — I25119 Atherosclerotic heart disease of native coronary artery with unspecified angina pectoris: Secondary | ICD-10-CM | POA: Diagnosis not present

## 2018-12-05 DIAGNOSIS — N184 Chronic kidney disease, stage 4 (severe): Secondary | ICD-10-CM | POA: Diagnosis not present

## 2018-12-05 DIAGNOSIS — E1122 Type 2 diabetes mellitus with diabetic chronic kidney disease: Secondary | ICD-10-CM | POA: Diagnosis not present

## 2018-12-05 DIAGNOSIS — I10 Essential (primary) hypertension: Secondary | ICD-10-CM | POA: Diagnosis not present

## 2018-12-12 DIAGNOSIS — N184 Chronic kidney disease, stage 4 (severe): Secondary | ICD-10-CM | POA: Diagnosis not present

## 2018-12-12 DIAGNOSIS — I10 Essential (primary) hypertension: Secondary | ICD-10-CM | POA: Diagnosis not present

## 2018-12-12 DIAGNOSIS — E7800 Pure hypercholesterolemia, unspecified: Secondary | ICD-10-CM | POA: Diagnosis not present

## 2018-12-12 DIAGNOSIS — G4733 Obstructive sleep apnea (adult) (pediatric): Secondary | ICD-10-CM | POA: Diagnosis not present

## 2018-12-12 DIAGNOSIS — I25119 Atherosclerotic heart disease of native coronary artery with unspecified angina pectoris: Secondary | ICD-10-CM | POA: Diagnosis not present

## 2018-12-12 DIAGNOSIS — E1122 Type 2 diabetes mellitus with diabetic chronic kidney disease: Secondary | ICD-10-CM | POA: Diagnosis not present

## 2018-12-12 DIAGNOSIS — C50412 Malignant neoplasm of upper-outer quadrant of left female breast: Secondary | ICD-10-CM | POA: Diagnosis not present

## 2018-12-12 DIAGNOSIS — Z23 Encounter for immunization: Secondary | ICD-10-CM | POA: Diagnosis not present

## 2018-12-12 DIAGNOSIS — Z6841 Body Mass Index (BMI) 40.0 and over, adult: Secondary | ICD-10-CM | POA: Diagnosis not present

## 2018-12-12 DIAGNOSIS — I480 Paroxysmal atrial fibrillation: Secondary | ICD-10-CM | POA: Diagnosis not present

## 2018-12-12 DIAGNOSIS — Z9989 Dependence on other enabling machines and devices: Secondary | ICD-10-CM | POA: Diagnosis not present

## 2018-12-12 DIAGNOSIS — Z0000 Encounter for general adult medical examination without abnormal findings: Secondary | ICD-10-CM | POA: Diagnosis not present

## 2019-01-09 DIAGNOSIS — I10 Essential (primary) hypertension: Secondary | ICD-10-CM | POA: Diagnosis not present

## 2019-01-09 DIAGNOSIS — G4733 Obstructive sleep apnea (adult) (pediatric): Secondary | ICD-10-CM | POA: Diagnosis not present

## 2019-02-02 DIAGNOSIS — I10 Essential (primary) hypertension: Secondary | ICD-10-CM | POA: Diagnosis not present

## 2019-02-02 DIAGNOSIS — R600 Localized edema: Secondary | ICD-10-CM | POA: Diagnosis not present

## 2019-02-02 DIAGNOSIS — N184 Chronic kidney disease, stage 4 (severe): Secondary | ICD-10-CM | POA: Diagnosis not present

## 2019-02-02 DIAGNOSIS — E1129 Type 2 diabetes mellitus with other diabetic kidney complication: Secondary | ICD-10-CM | POA: Diagnosis not present

## 2019-02-07 DIAGNOSIS — N189 Chronic kidney disease, unspecified: Secondary | ICD-10-CM | POA: Diagnosis not present

## 2019-02-07 DIAGNOSIS — I10 Essential (primary) hypertension: Secondary | ICD-10-CM | POA: Diagnosis not present

## 2019-02-07 DIAGNOSIS — N184 Chronic kidney disease, stage 4 (severe): Secondary | ICD-10-CM | POA: Diagnosis not present

## 2019-02-07 DIAGNOSIS — E1122 Type 2 diabetes mellitus with diabetic chronic kidney disease: Secondary | ICD-10-CM | POA: Diagnosis not present

## 2019-04-04 DIAGNOSIS — I208 Other forms of angina pectoris: Secondary | ICD-10-CM | POA: Diagnosis not present

## 2019-04-04 DIAGNOSIS — Z9989 Dependence on other enabling machines and devices: Secondary | ICD-10-CM | POA: Diagnosis not present

## 2019-04-04 DIAGNOSIS — E1169 Type 2 diabetes mellitus with other specified complication: Secondary | ICD-10-CM | POA: Diagnosis not present

## 2019-04-04 DIAGNOSIS — I480 Paroxysmal atrial fibrillation: Secondary | ICD-10-CM | POA: Diagnosis not present

## 2019-04-04 DIAGNOSIS — Z6841 Body Mass Index (BMI) 40.0 and over, adult: Secondary | ICD-10-CM | POA: Diagnosis not present

## 2019-04-04 DIAGNOSIS — R0602 Shortness of breath: Secondary | ICD-10-CM | POA: Diagnosis not present

## 2019-04-04 DIAGNOSIS — I10 Essential (primary) hypertension: Secondary | ICD-10-CM | POA: Diagnosis not present

## 2019-04-04 DIAGNOSIS — I2510 Atherosclerotic heart disease of native coronary artery without angina pectoris: Secondary | ICD-10-CM | POA: Diagnosis not present

## 2019-04-04 DIAGNOSIS — G4733 Obstructive sleep apnea (adult) (pediatric): Secondary | ICD-10-CM | POA: Diagnosis not present

## 2019-04-04 DIAGNOSIS — I25119 Atherosclerotic heart disease of native coronary artery with unspecified angina pectoris: Secondary | ICD-10-CM | POA: Diagnosis not present

## 2019-04-10 ENCOUNTER — Encounter: Payer: Self-pay | Admitting: *Deleted

## 2019-04-10 DIAGNOSIS — G4733 Obstructive sleep apnea (adult) (pediatric): Secondary | ICD-10-CM | POA: Diagnosis not present

## 2019-04-10 DIAGNOSIS — I10 Essential (primary) hypertension: Secondary | ICD-10-CM | POA: Diagnosis not present

## 2019-04-12 DIAGNOSIS — E7800 Pure hypercholesterolemia, unspecified: Secondary | ICD-10-CM | POA: Diagnosis not present

## 2019-04-12 DIAGNOSIS — N184 Chronic kidney disease, stage 4 (severe): Secondary | ICD-10-CM | POA: Diagnosis not present

## 2019-04-12 DIAGNOSIS — E1122 Type 2 diabetes mellitus with diabetic chronic kidney disease: Secondary | ICD-10-CM | POA: Diagnosis not present

## 2019-04-12 DIAGNOSIS — I10 Essential (primary) hypertension: Secondary | ICD-10-CM | POA: Diagnosis not present

## 2019-05-02 DIAGNOSIS — Z9989 Dependence on other enabling machines and devices: Secondary | ICD-10-CM | POA: Diagnosis not present

## 2019-05-02 DIAGNOSIS — E7800 Pure hypercholesterolemia, unspecified: Secondary | ICD-10-CM | POA: Diagnosis not present

## 2019-05-02 DIAGNOSIS — I10 Essential (primary) hypertension: Secondary | ICD-10-CM | POA: Diagnosis not present

## 2019-05-02 DIAGNOSIS — G4733 Obstructive sleep apnea (adult) (pediatric): Secondary | ICD-10-CM | POA: Diagnosis not present

## 2019-05-02 DIAGNOSIS — I480 Paroxysmal atrial fibrillation: Secondary | ICD-10-CM | POA: Diagnosis not present

## 2019-05-02 DIAGNOSIS — Z6841 Body Mass Index (BMI) 40.0 and over, adult: Secondary | ICD-10-CM | POA: Diagnosis not present

## 2019-05-02 DIAGNOSIS — E1122 Type 2 diabetes mellitus with diabetic chronic kidney disease: Secondary | ICD-10-CM | POA: Diagnosis not present

## 2019-05-02 DIAGNOSIS — I25119 Atherosclerotic heart disease of native coronary artery with unspecified angina pectoris: Secondary | ICD-10-CM | POA: Diagnosis not present

## 2019-05-02 DIAGNOSIS — N184 Chronic kidney disease, stage 4 (severe): Secondary | ICD-10-CM | POA: Diagnosis not present

## 2019-05-16 DIAGNOSIS — Z7901 Long term (current) use of anticoagulants: Secondary | ICD-10-CM | POA: Diagnosis not present

## 2019-05-16 DIAGNOSIS — Z5181 Encounter for therapeutic drug level monitoring: Secondary | ICD-10-CM | POA: Diagnosis not present

## 2019-05-22 DIAGNOSIS — N184 Chronic kidney disease, stage 4 (severe): Secondary | ICD-10-CM | POA: Diagnosis not present

## 2019-05-23 DIAGNOSIS — I10 Essential (primary) hypertension: Secondary | ICD-10-CM | POA: Diagnosis not present

## 2019-05-23 DIAGNOSIS — G4733 Obstructive sleep apnea (adult) (pediatric): Secondary | ICD-10-CM | POA: Diagnosis not present

## 2019-05-24 DIAGNOSIS — I480 Paroxysmal atrial fibrillation: Secondary | ICD-10-CM | POA: Diagnosis not present
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
## 2019-05-26 DIAGNOSIS — D045 Carcinoma in situ of skin of trunk: Secondary | ICD-10-CM | POA: Diagnosis not present

## 2019-05-26 DIAGNOSIS — D225 Melanocytic nevi of trunk: Secondary | ICD-10-CM | POA: Diagnosis not present

## 2019-05-26 DIAGNOSIS — Z85828 Personal history of other malignant neoplasm of skin: Secondary | ICD-10-CM | POA: Diagnosis not present

## 2019-05-26 DIAGNOSIS — C44529 Squamous cell carcinoma of skin of other part of trunk: Secondary | ICD-10-CM | POA: Diagnosis not present

## 2019-05-26 DIAGNOSIS — D485 Neoplasm of uncertain behavior of skin: Secondary | ICD-10-CM | POA: Diagnosis not present

## 2019-05-26 DIAGNOSIS — L821 Other seborrheic keratosis: Secondary | ICD-10-CM | POA: Diagnosis not present

## 2019-05-26 DIAGNOSIS — D2272 Melanocytic nevi of left lower limb, including hip: Secondary | ICD-10-CM | POA: Diagnosis not present

## 2019-05-26 DIAGNOSIS — D2261 Melanocytic nevi of right upper limb, including shoulder: Secondary | ICD-10-CM | POA: Diagnosis not present

## 2019-05-26 DIAGNOSIS — L570 Actinic keratosis: Secondary | ICD-10-CM | POA: Diagnosis not present

## 2019-05-30 DIAGNOSIS — N184 Chronic kidney disease, stage 4 (severe): Secondary | ICD-10-CM | POA: Diagnosis present

## 2019-05-30 DIAGNOSIS — N3090 Cystitis, unspecified without hematuria: Secondary | ICD-10-CM | POA: Diagnosis not present

## 2019-05-30 DIAGNOSIS — I10 Essential (primary) hypertension: Secondary | ICD-10-CM | POA: Insufficient documentation

## 2019-05-30 DIAGNOSIS — E1129 Type 2 diabetes mellitus with other diabetic kidney complication: Secondary | ICD-10-CM | POA: Diagnosis not present

## 2019-05-30 DIAGNOSIS — R600 Localized edema: Secondary | ICD-10-CM | POA: Diagnosis not present

## 2019-05-31 ENCOUNTER — Encounter: Payer: Self-pay | Admitting: Oncology

## 2019-05-31 NOTE — Progress Notes (Signed)
Patient reports left breast tenderness. Denies other concerns at this time.

## 2019-06-01 ENCOUNTER — Inpatient Hospital Stay: Payer: PPO | Attending: Oncology | Admitting: Oncology

## 2019-06-01 ENCOUNTER — Encounter: Payer: Self-pay | Admitting: Oncology

## 2019-06-01 ENCOUNTER — Other Ambulatory Visit: Payer: Self-pay

## 2019-06-01 VITALS — BP 164/58 | HR 60 | Temp 97.0°F | Wt 227.4 lb

## 2019-06-01 DIAGNOSIS — M8580 Other specified disorders of bone density and structure, unspecified site: Secondary | ICD-10-CM | POA: Insufficient documentation

## 2019-06-01 DIAGNOSIS — C50412 Malignant neoplasm of upper-outer quadrant of left female breast: Secondary | ICD-10-CM | POA: Insufficient documentation

## 2019-06-01 DIAGNOSIS — Z79811 Long term (current) use of aromatase inhibitors: Secondary | ICD-10-CM | POA: Insufficient documentation

## 2019-06-01 DIAGNOSIS — I10 Essential (primary) hypertension: Secondary | ICD-10-CM | POA: Diagnosis not present

## 2019-06-01 DIAGNOSIS — I2510 Atherosclerotic heart disease of native coronary artery without angina pectoris: Secondary | ICD-10-CM | POA: Diagnosis not present

## 2019-06-01 DIAGNOSIS — Z170 Estrogen receptor positive status [ER+]: Secondary | ICD-10-CM | POA: Insufficient documentation

## 2019-06-02 DIAGNOSIS — I480 Paroxysmal atrial fibrillation: Secondary | ICD-10-CM | POA: Diagnosis not present

## 2019-07-04 DIAGNOSIS — L570 Actinic keratosis: Secondary | ICD-10-CM | POA: Diagnosis not present

## 2019-07-04 DIAGNOSIS — D045 Carcinoma in situ of skin of trunk: Secondary | ICD-10-CM | POA: Diagnosis not present

## 2019-07-04 DIAGNOSIS — X32XXXA Exposure to sunlight, initial encounter: Secondary | ICD-10-CM | POA: Diagnosis not present

## 2019-07-05 DIAGNOSIS — N184 Chronic kidney disease, stage 4 (severe): Secondary | ICD-10-CM | POA: Diagnosis not present

## 2019-07-05 DIAGNOSIS — E119 Type 2 diabetes mellitus without complications: Secondary | ICD-10-CM | POA: Diagnosis not present

## 2019-07-05 DIAGNOSIS — I10 Essential (primary) hypertension: Secondary | ICD-10-CM | POA: Diagnosis not present

## 2019-07-10 DIAGNOSIS — G4733 Obstructive sleep apnea (adult) (pediatric): Secondary | ICD-10-CM | POA: Diagnosis not present

## 2019-07-10 DIAGNOSIS — I10 Essential (primary) hypertension: Secondary | ICD-10-CM | POA: Diagnosis not present

## 2019-07-17 ENCOUNTER — Other Ambulatory Visit: Payer: Self-pay

## 2019-07-17 ENCOUNTER — Emergency Department
Admission: EM | Admit: 2019-07-17 | Discharge: 2019-07-17 | Disposition: A | Discharge status: Home / Self Care | Payer: PPO | Attending: Emergency Medicine | Admitting: Emergency Medicine

## 2019-07-17 ENCOUNTER — Other Ambulatory Visit
Admission: RE | Admit: 2019-07-17 | Discharge: 2019-07-17 | Disposition: A | Discharge status: Home / Self Care | Payer: PPO | Source: Ambulatory Visit | Attending: Family Medicine | Admitting: Family Medicine

## 2019-07-17 ENCOUNTER — Encounter: Payer: Self-pay | Admitting: *Deleted

## 2019-07-17 DIAGNOSIS — Z9861 Coronary angioplasty status: Secondary | ICD-10-CM | POA: Insufficient documentation

## 2019-07-17 DIAGNOSIS — Z7901 Long term (current) use of anticoagulants: Secondary | ICD-10-CM | POA: Diagnosis not present

## 2019-07-17 DIAGNOSIS — M25461 Effusion, right knee: Secondary | ICD-10-CM | POA: Diagnosis not present

## 2019-07-17 DIAGNOSIS — L539 Erythematous condition, unspecified: Secondary | ICD-10-CM | POA: Diagnosis not present

## 2019-07-17 DIAGNOSIS — E119 Type 2 diabetes mellitus without complications: Secondary | ICD-10-CM | POA: Insufficient documentation

## 2019-07-17 DIAGNOSIS — Z7984 Long term (current) use of oral hypoglycemic drugs: Secondary | ICD-10-CM | POA: Insufficient documentation

## 2019-07-17 DIAGNOSIS — R791 Abnormal coagulation profile: Secondary | ICD-10-CM | POA: Diagnosis not present

## 2019-07-17 DIAGNOSIS — I119 Hypertensive heart disease without heart failure: Secondary | ICD-10-CM | POA: Diagnosis not present

## 2019-07-17 DIAGNOSIS — Z79899 Other long term (current) drug therapy: Secondary | ICD-10-CM | POA: Diagnosis not present

## 2019-07-17 DIAGNOSIS — S0083XA Contusion of other part of head, initial encounter: Secondary | ICD-10-CM | POA: Diagnosis not present

## 2019-07-17 DIAGNOSIS — R58 Hemorrhage, not elsewhere classified: Secondary | ICD-10-CM

## 2019-07-17 DIAGNOSIS — M25061 Hemarthrosis, right knee: Secondary | ICD-10-CM

## 2019-07-17 DIAGNOSIS — T45515A Adverse effect of anticoagulants, initial encounter: Secondary | ICD-10-CM | POA: Diagnosis not present

## 2019-07-17 DIAGNOSIS — R233 Spontaneous ecchymoses: Secondary | ICD-10-CM | POA: Diagnosis not present

## 2019-07-17 DIAGNOSIS — I2510 Atherosclerotic heart disease of native coronary artery without angina pectoris: Secondary | ICD-10-CM | POA: Insufficient documentation

## 2019-07-17 LAB — APTT: aPTT: 71 seconds — ABNORMAL HIGH (ref 24–36)

## 2019-07-17 MED ORDER — PHYTONADIONE 5 MG PO TABS
5.0000 mg | ORAL_TABLET | Freq: Once | ORAL | Status: AC
  Administered 2019-07-17: 5 mg via ORAL
  Filled 2019-07-17: qty 1

## 2019-07-17 MED ORDER — HYDROCODONE-ACETAMINOPHEN 5-325 MG PO TABS
1.0000 | ORAL_TABLET | Freq: Once | ORAL | Status: AC
  Administered 2019-07-17: 1 via ORAL
  Filled 2019-07-17: qty 1

## 2019-07-17 NOTE — ED Notes (Signed)
EDP @ bedside 

## 2019-07-17 NOTE — ED Notes (Signed)
Pt does not want lab work done at this time
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
## 2019-07-17 NOTE — ED Triage Notes (Addendum)
Pt sent to the ER for eval of abnormal aptt.  Pt had lab drawn at [facility].  States dr Clayborn Bigness told pt to come er for vit k injection. Pt has bruising to right side of neck.  No known injury. Pt was at urgent care for right knee pain today.  Pt alert  Speech clear.

## 2019-07-17 NOTE — Discharge Instructions (Signed)
Get help right away at the closes ER by calling 911 if: You have begin having ANY bleeding, new pain, new bruising, or new swelling You have a headache or stomachache. You vomit or cough up blood. You fall or hit your head.  Stop taking your Coumadin  See Dr. Clayborn Bigness tomorrow

## 2019-07-17 NOTE — ED Notes (Signed)
First Nurse Note: Pt to ED from [facility] in. Pt was send over by Dr. Clayborn Bigness. Pts INR 9.0 with bruising around her neck.

## 2019-07-18 DIAGNOSIS — R0602 Shortness of breath: Secondary | ICD-10-CM | POA: Diagnosis not present

## 2019-07-18 DIAGNOSIS — Z7901 Long term (current) use of anticoagulants: Secondary | ICD-10-CM | POA: Diagnosis not present

## 2019-07-18 DIAGNOSIS — I480 Paroxysmal atrial fibrillation: Secondary | ICD-10-CM | POA: Diagnosis not present

## 2019-07-18 DIAGNOSIS — E7800 Pure hypercholesterolemia, unspecified: Secondary | ICD-10-CM | POA: Diagnosis not present

## 2019-07-18 DIAGNOSIS — I25119 Atherosclerotic heart disease of native coronary artery with unspecified angina pectoris: Secondary | ICD-10-CM | POA: Diagnosis not present

## 2019-07-18 DIAGNOSIS — G4733 Obstructive sleep apnea (adult) (pediatric): Secondary | ICD-10-CM | POA: Diagnosis not present

## 2019-07-18 DIAGNOSIS — I2510 Atherosclerotic heart disease of native coronary artery without angina pectoris: Secondary | ICD-10-CM | POA: Diagnosis not present

## 2019-07-18 DIAGNOSIS — E1169 Type 2 diabetes mellitus with other specified complication: Secondary | ICD-10-CM | POA: Diagnosis not present

## 2019-07-18 DIAGNOSIS — Z5181 Encounter for therapeutic drug level monitoring: Secondary | ICD-10-CM | POA: Diagnosis not present

## 2019-07-18 DIAGNOSIS — I208 Other forms of angina pectoris: Secondary | ICD-10-CM | POA: Diagnosis not present

## 2019-07-18 DIAGNOSIS — I10 Essential (primary) hypertension: Secondary | ICD-10-CM | POA: Diagnosis not present

## 2019-08-02 DIAGNOSIS — G4733 Obstructive sleep apnea (adult) (pediatric): Secondary | ICD-10-CM | POA: Diagnosis not present

## 2019-08-02 DIAGNOSIS — I480 Paroxysmal atrial fibrillation: Secondary | ICD-10-CM | POA: Diagnosis not present

## 2019-08-02 DIAGNOSIS — I25119 Atherosclerotic heart disease of native coronary artery with unspecified angina pectoris: Secondary | ICD-10-CM | POA: Diagnosis not present

## 2019-08-02 DIAGNOSIS — E1169 Type 2 diabetes mellitus with other specified complication: Secondary | ICD-10-CM | POA: Diagnosis not present

## 2019-08-02 DIAGNOSIS — I208 Other forms of angina pectoris: Secondary | ICD-10-CM | POA: Diagnosis not present

## 2019-08-02 DIAGNOSIS — R0602 Shortness of breath: Secondary | ICD-10-CM | POA: Diagnosis not present

## 2019-08-02 DIAGNOSIS — I10 Essential (primary) hypertension: Secondary | ICD-10-CM | POA: Diagnosis not present

## 2019-08-02 DIAGNOSIS — I2510 Atherosclerotic heart disease of native coronary artery without angina pectoris: Secondary | ICD-10-CM | POA: Diagnosis not present

## 2019-08-02 DIAGNOSIS — Z6841 Body Mass Index (BMI) 40.0 and over, adult: Secondary | ICD-10-CM | POA: Diagnosis not present

## 2019-08-02 DIAGNOSIS — Z9989 Dependence on other enabling machines and devices: Secondary | ICD-10-CM | POA: Diagnosis not present

## 2019-08-09 DIAGNOSIS — N184 Chronic kidney disease, stage 4 (severe): Secondary | ICD-10-CM | POA: Diagnosis not present

## 2019-08-13 DIAGNOSIS — G4733 Obstructive sleep apnea (adult) (pediatric): Secondary | ICD-10-CM | POA: Diagnosis not present

## 2019-08-13 DIAGNOSIS — I10 Essential (primary) hypertension: Secondary | ICD-10-CM | POA: Diagnosis not present

## 2019-08-14 IMAGING — MG MM BREAST SURGICAL SPECIMEN
2 series · 2 of 2 positions shown · non-contrast
Comparison: Previous exam(s).

CLINICAL DATA: History of 3 ultrasound-guided core biopsies of the
left breast all positive for malignancy. Status post surgical
excision following bracketed needle localization.

EXAM:
SPECIMEN RADIOGRAPH OF THE LEFT BREAST

[L SPECIMEN (1 of 2)]
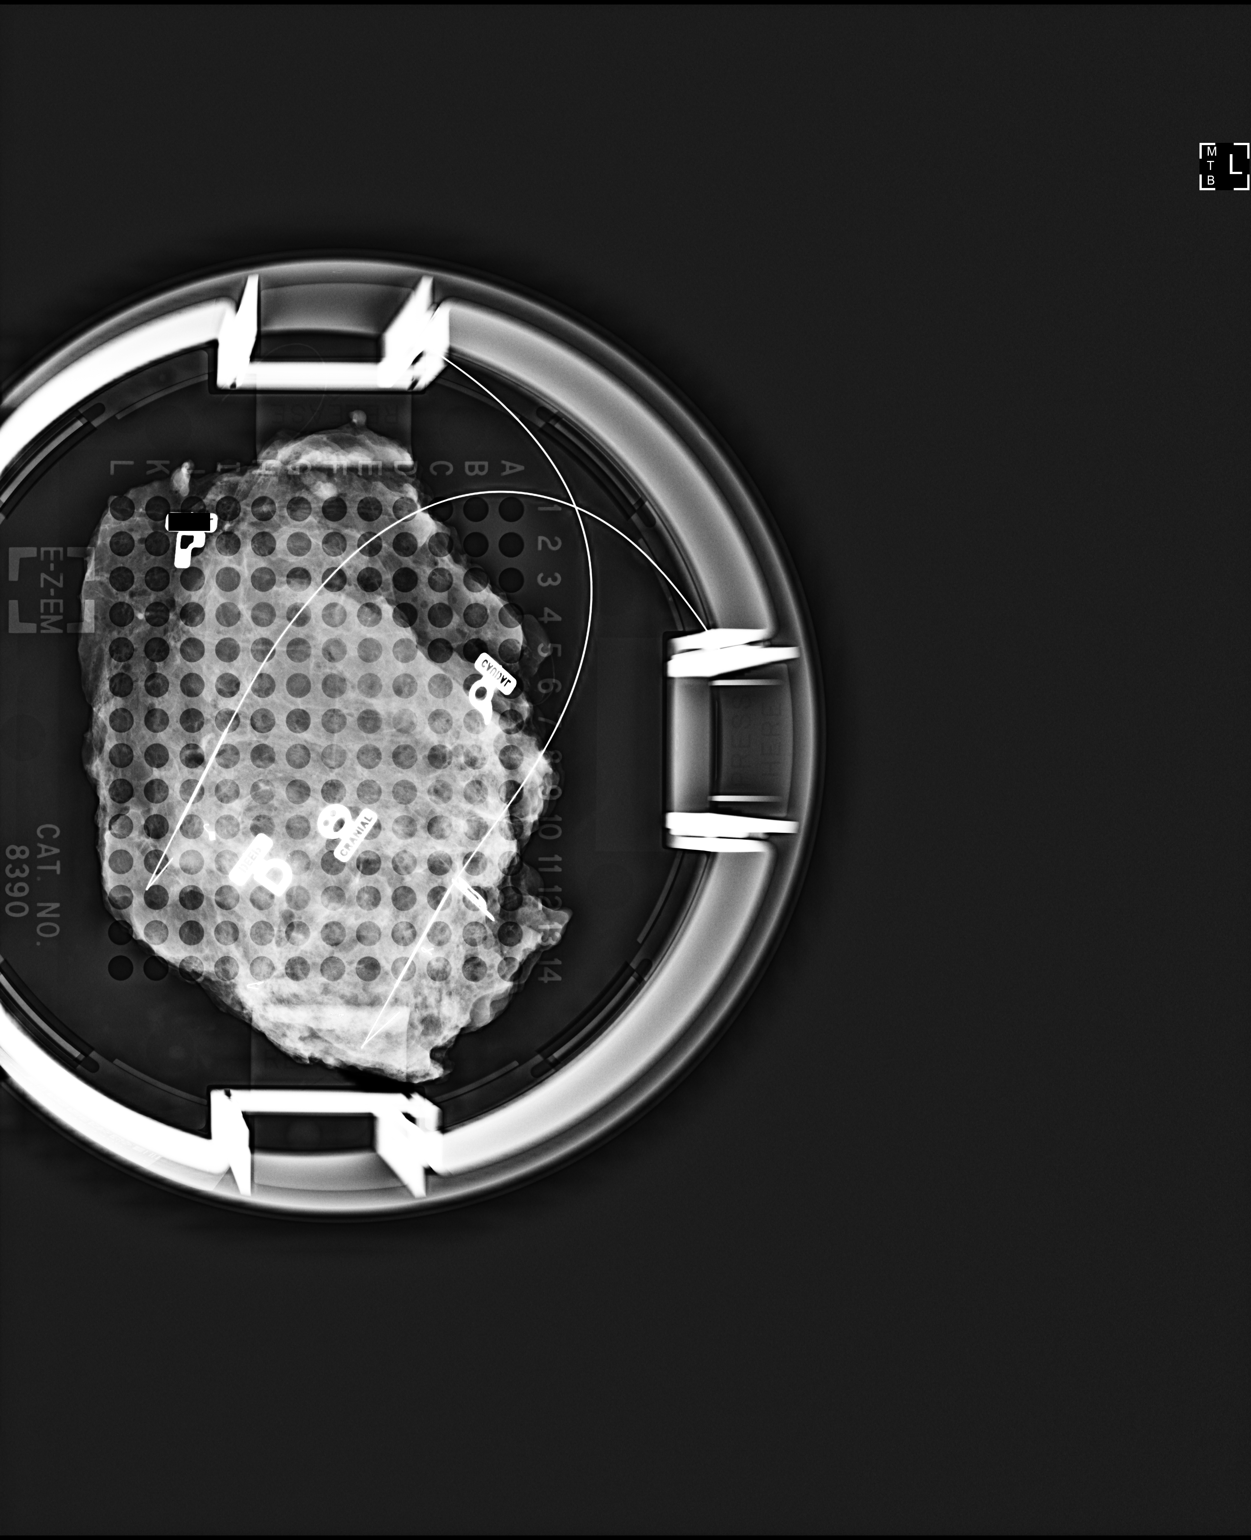

[L SPECIMEN (2 of 2)]
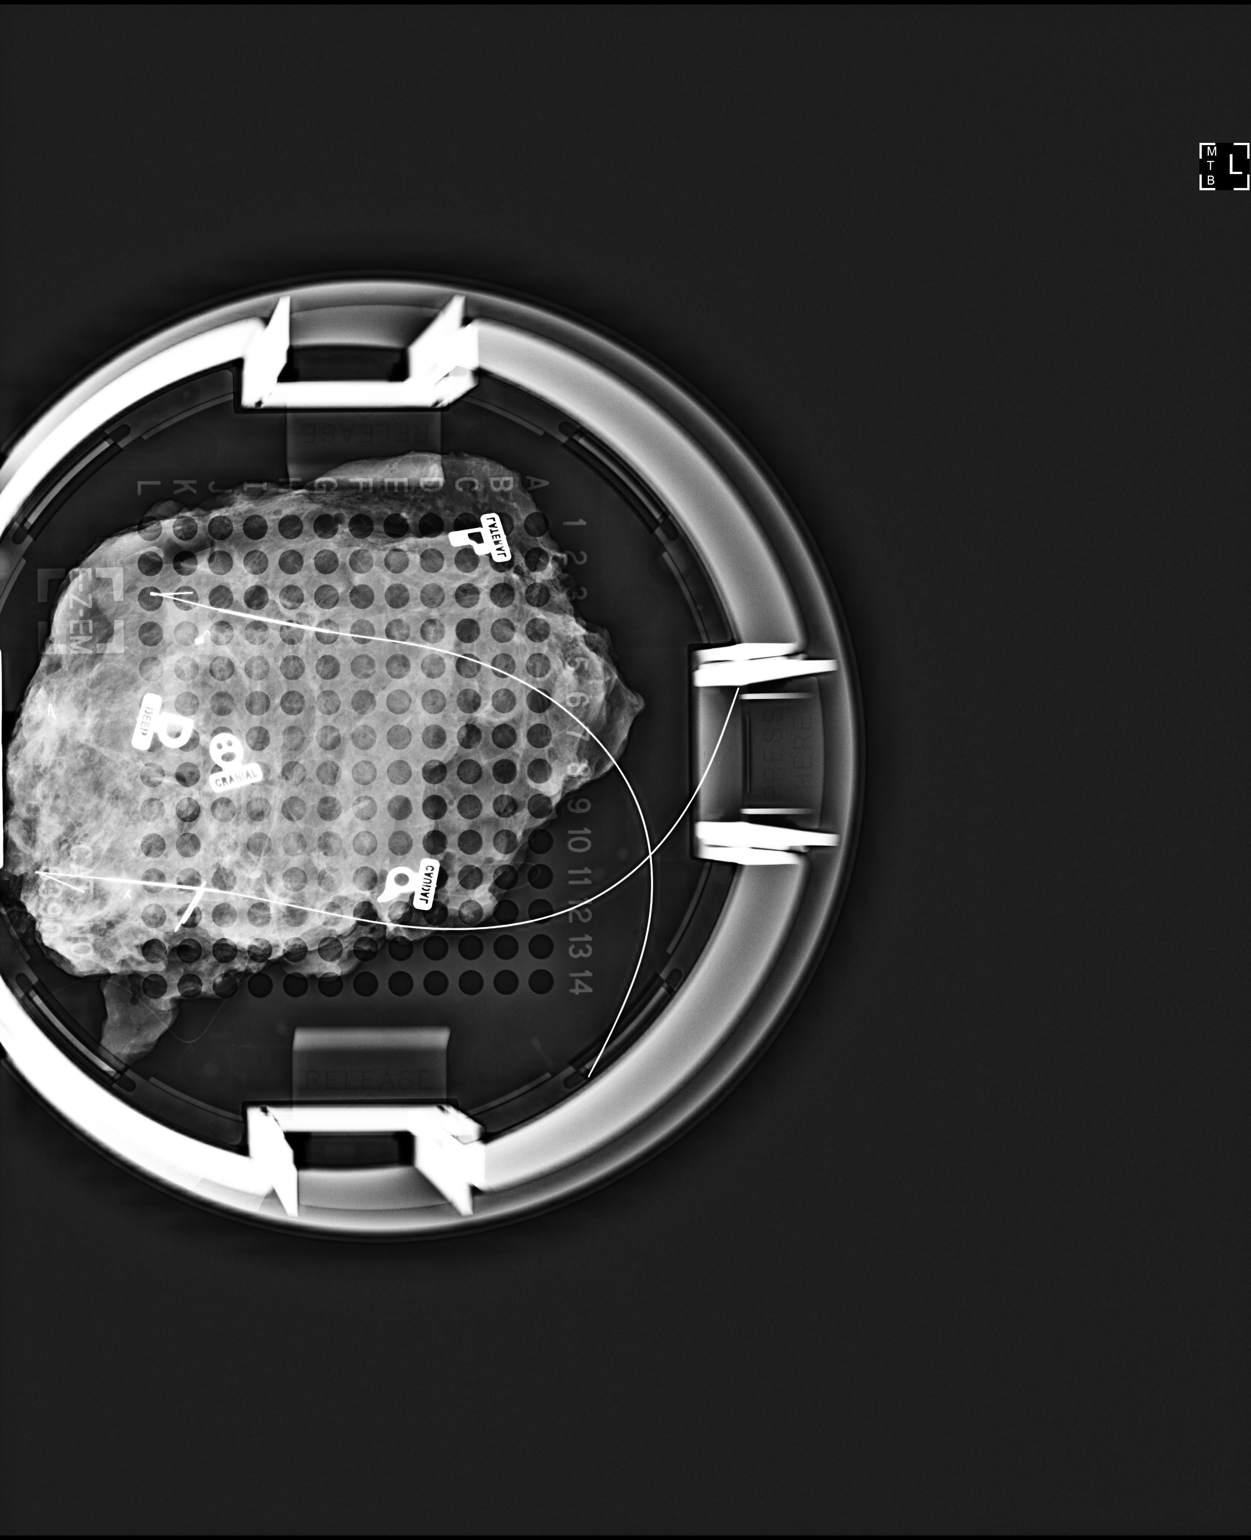

[2 of 2 positions shown; findings below may reference images not displayed]

FINDINGS: Status post excision of the left breast. The wire tips and 3 marker
clips are present and are marked for pathology. The wing shaped
marker clip is close to the surgical margin.
IMPRESSION: Specimen radiograph of the left breast. Findings discussed with Dr.
Anna.

## 2019-08-24 DIAGNOSIS — N184 Chronic kidney disease, stage 4 (severe): Secondary | ICD-10-CM | POA: Diagnosis not present

## 2019-08-24 DIAGNOSIS — E1122 Type 2 diabetes mellitus with diabetic chronic kidney disease: Secondary | ICD-10-CM | POA: Diagnosis not present

## 2019-08-24 DIAGNOSIS — I480 Paroxysmal atrial fibrillation: Secondary | ICD-10-CM | POA: Diagnosis not present

## 2019-08-24 DIAGNOSIS — E7800 Pure hypercholesterolemia, unspecified: Secondary | ICD-10-CM | POA: Diagnosis not present

## 2019-08-31 DIAGNOSIS — E7800 Pure hypercholesterolemia, unspecified: Secondary | ICD-10-CM | POA: Diagnosis not present

## 2019-08-31 DIAGNOSIS — I480 Paroxysmal atrial fibrillation: Secondary | ICD-10-CM | POA: Diagnosis not present

## 2019-08-31 DIAGNOSIS — N184 Chronic kidney disease, stage 4 (severe): Secondary | ICD-10-CM | POA: Diagnosis not present

## 2019-08-31 DIAGNOSIS — Z6841 Body Mass Index (BMI) 40.0 and over, adult: Secondary | ICD-10-CM | POA: Diagnosis not present

## 2019-08-31 DIAGNOSIS — G4733 Obstructive sleep apnea (adult) (pediatric): Secondary | ICD-10-CM | POA: Diagnosis not present

## 2019-08-31 DIAGNOSIS — Z9989 Dependence on other enabling machines and devices: Secondary | ICD-10-CM | POA: Diagnosis not present

## 2019-08-31 DIAGNOSIS — R300 Dysuria: Secondary | ICD-10-CM | POA: Diagnosis not present

## 2019-08-31 DIAGNOSIS — I10 Essential (primary) hypertension: Secondary | ICD-10-CM | POA: Diagnosis not present

## 2019-08-31 DIAGNOSIS — E1122 Type 2 diabetes mellitus with diabetic chronic kidney disease: Secondary | ICD-10-CM | POA: Diagnosis not present

## 2019-08-31 DIAGNOSIS — I25119 Atherosclerotic heart disease of native coronary artery with unspecified angina pectoris: Secondary | ICD-10-CM | POA: Diagnosis not present

## 2019-09-20 DIAGNOSIS — I10 Essential (primary) hypertension: Secondary | ICD-10-CM | POA: Diagnosis not present

## 2019-09-20 DIAGNOSIS — G4733 Obstructive sleep apnea (adult) (pediatric): Secondary | ICD-10-CM | POA: Diagnosis not present

## 2019-09-27 DIAGNOSIS — N184 Chronic kidney disease, stage 4 (severe): Secondary | ICD-10-CM | POA: Diagnosis not present

## 2019-10-02 ENCOUNTER — Other Ambulatory Visit: Payer: Self-pay

## 2019-10-02 ENCOUNTER — Encounter: Payer: Self-pay | Admitting: *Deleted

## 2019-10-02 ENCOUNTER — Encounter: Payer: Self-pay | Admitting: Oncology

## 2019-10-02 ENCOUNTER — Inpatient Hospital Stay: Payer: PPO | Attending: Oncology | Admitting: Oncology

## 2019-10-02 ENCOUNTER — Telehealth: Payer: Self-pay | Admitting: *Deleted

## 2019-10-02 VITALS — BP 133/58 | HR 62 | Temp 97.8°F | Wt 226.0 lb

## 2019-10-02 DIAGNOSIS — Z170 Estrogen receptor positive status [ER+]: Secondary | ICD-10-CM | POA: Insufficient documentation

## 2019-10-02 DIAGNOSIS — Z79899 Other long term (current) drug therapy: Secondary | ICD-10-CM | POA: Insufficient documentation

## 2019-10-02 DIAGNOSIS — Z794 Long term (current) use of insulin: Secondary | ICD-10-CM | POA: Insufficient documentation

## 2019-10-02 DIAGNOSIS — Z808 Family history of malignant neoplasm of other organs or systems: Secondary | ICD-10-CM | POA: Insufficient documentation

## 2019-10-02 DIAGNOSIS — I10 Essential (primary) hypertension: Secondary | ICD-10-CM | POA: Diagnosis not present

## 2019-10-02 DIAGNOSIS — M8580 Other specified disorders of bone density and structure, unspecified site: Secondary | ICD-10-CM | POA: Insufficient documentation

## 2019-10-02 DIAGNOSIS — C50412 Malignant neoplasm of upper-outer quadrant of left female breast: Secondary | ICD-10-CM | POA: Diagnosis not present

## 2019-10-02 DIAGNOSIS — E119 Type 2 diabetes mellitus without complications: Secondary | ICD-10-CM | POA: Insufficient documentation

## 2019-10-02 DIAGNOSIS — N61 Mastitis without abscess: Secondary | ICD-10-CM

## 2019-10-02 DIAGNOSIS — N870 Mild cervical dysplasia: Secondary | ICD-10-CM | POA: Insufficient documentation

## 2019-10-02 DIAGNOSIS — Z6841 Body Mass Index (BMI) 40.0 and over, adult: Secondary | ICD-10-CM | POA: Insufficient documentation

## 2019-10-02 DIAGNOSIS — R600 Localized edema: Secondary | ICD-10-CM | POA: Insufficient documentation

## 2019-10-02 DIAGNOSIS — Z8249 Family history of ischemic heart disease and other diseases of the circulatory system: Secondary | ICD-10-CM | POA: Diagnosis not present

## 2019-10-02 DIAGNOSIS — Z79811 Long term (current) use of aromatase inhibitors: Secondary | ICD-10-CM | POA: Diagnosis not present

## 2019-10-02 DIAGNOSIS — N762 Acute vulvitis: Secondary | ICD-10-CM | POA: Insufficient documentation

## 2019-10-02 DIAGNOSIS — N879 Dysplasia of cervix uteri, unspecified: Secondary | ICD-10-CM | POA: Insufficient documentation

## 2019-10-02 MED ORDER — CEPHALEXIN 500 MG PO CAPS: 500.0000 mg | ORAL_CAPSULE | Freq: Four times a day (QID) | ORAL | 0 refills | Status: DC
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
## 2019-10-02 NOTE — Telephone Encounter (Signed)
If Dr. Baruch Gouty is unable to see her, I can see he this afternoon or tomorrow.   Faythe Casa, NP 10/02/2019 10:44 AM

## 2019-10-02 NOTE — Progress Notes (Signed)
Patient here today for acute add on regarding swelling and redness in left breast. Patient reports this started about 2 days ago, has improved but is also experiencing itching at this time.

## 2019-10-02 NOTE — Telephone Encounter (Signed)
Patient called back and accepts appointment for 215 today

## 2019-10-02 NOTE — Telephone Encounter (Signed)
Call returned to patient and left message on voice mail for appointment this afternoon, I asked that she call me back

## 2019-10-02 NOTE — Telephone Encounter (Signed)
Patient called reporting that her breast that was radiated became red and swollen over the weekend, It is hot to touch, is itching now. It is not quite as red today as yesterday. She is asking if she should be seen by Dr Baruch Gouty sooner than her August appointment. Please advise

## 2019-10-03 ENCOUNTER — Other Ambulatory Visit: Payer: Self-pay | Admitting: *Deleted

## 2019-10-03 DIAGNOSIS — N184 Chronic kidney disease, stage 4 (severe): Secondary | ICD-10-CM | POA: Diagnosis not present

## 2019-10-03 DIAGNOSIS — I10 Essential (primary) hypertension: Secondary | ICD-10-CM | POA: Diagnosis not present

## 2019-10-03 DIAGNOSIS — E1129 Type 2 diabetes mellitus with other diabetic kidney complication: Secondary | ICD-10-CM | POA: Diagnosis not present

## 2019-10-03 DIAGNOSIS — R600 Localized edema: Secondary | ICD-10-CM | POA: Diagnosis not present

## 2019-10-03 DIAGNOSIS — N3090 Cystitis, unspecified without hematuria: Secondary | ICD-10-CM | POA: Diagnosis not present

## 2019-10-09 ENCOUNTER — Other Ambulatory Visit: Payer: Self-pay | Admitting: Oncology

## 2019-10-09 ENCOUNTER — Inpatient Hospital Stay (HOSPITAL_BASED_OUTPATIENT_CLINIC_OR_DEPARTMENT_OTHER): Payer: PPO | Admitting: Oncology

## 2019-10-09 DIAGNOSIS — C50412 Malignant neoplasm of upper-outer quadrant of left female breast: Secondary | ICD-10-CM

## 2019-10-09 DIAGNOSIS — N61 Mastitis without abscess: Secondary | ICD-10-CM

## 2019-10-09 NOTE — Progress Notes (Signed)
Orders placed for OT/PT treatment for prevention of lymphedema left arm.   Faythe Casa, NP 10/09/2019 11:20 AM

## 2019-10-09 NOTE — Progress Notes (Signed)
Virtual Visit via Telephone Note  I connected with Bonnie Holt on 10/09/19 at 10:00 AM EDT by telephone and verified that I am speaking with the correct person using two identifiers.  Location: Patient: Home Provider: Office   I discussed the limitations, risks, security and privacy concerns of performing an evaluation and management service by telephone and the availability of in person appointments. I also discussed with the patient that there may be a patient responsible charge related to this service. The patient expressed understanding and agreed to proceed.  History of Present Illness:Bonnie Holt is a 74 year old female with past medical history significant for CAD, acute gastric ulcer with hemorrhage, acute respiratory failure, diabetes, acute renal failure and stage Ia left breast cancer followed by Dr. Grayland Ormond.  Had lumpectomy with 1 lymph node removed (negative) and XRT which she completed in January 2019.  She is currently on letrozole and tolerating well.  Observations/Objective: She presented to symptom management 1 week ago for left breast swelling and redness. Symptoms and examination were consistent with cellulitis of the left breast. She was started on Keflex 500 mg four times daily x5 days. Today, patient states symptoms have completely resolved with antibiotics. She denies any further concerns at this time. We spoke at length about lymphedema in that left arm given she had radiation with lymph node removal. I recommended OT evaluation with Rosalyn Gess given recent cellulitis. Patient agreed.  Assessment and Plan: Continue antibiotics until completion. Referral to Occupational Therapy for prevention of lymphedema.  Follow Up Instructions: RTC as scheduled for follow-up with Dr. Grayland Ormond. Occupational Therapy will reach out in the next couple weeks.  I discussed the assessment and treatment plan with the patient. The patient was provided an opportunity to ask  questions and all were answered. The patient agreed with the plan and demonstrated an understanding of the instructions.   The patient was advised to call back or seek an in-person evaluation if the symptoms worsen or if the condition fails to improve as anticipated.  I provided 15 minutes of non-face-to-face time during this encounter.   Jacquelin Hawking, NP

## 2019-10-16 ENCOUNTER — Other Ambulatory Visit: Payer: Self-pay

## 2019-10-16 ENCOUNTER — Encounter: Payer: Self-pay | Admitting: Occupational Therapy

## 2019-10-16 ENCOUNTER — Ambulatory Visit: Payer: PPO | Attending: Oncology | Admitting: Occupational Therapy

## 2019-10-16 DIAGNOSIS — L905 Scar conditions and fibrosis of skin: Secondary | ICD-10-CM

## 2019-10-16 DIAGNOSIS — N61 Mastitis without abscess: Secondary | ICD-10-CM | POA: Diagnosis not present

## 2019-10-16 NOTE — Patient Instructions (Signed)
Pt ed on doing some soft tissue mobs to scar and around scar - on Lateral L breast  3 x day 5 min  And then modify MLD for AAA from L to R - open hand - 10 reps
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
## 2019-10-20 ENCOUNTER — Ambulatory Visit: Payer: PPO | Admitting: Radiation Oncology

## 2019-10-20 DIAGNOSIS — G4733 Obstructive sleep apnea (adult) (pediatric): Secondary | ICD-10-CM | POA: Diagnosis not present

## 2019-10-20 DIAGNOSIS — I10 Essential (primary) hypertension: Secondary | ICD-10-CM | POA: Diagnosis not present

## 2019-10-23 ENCOUNTER — Ambulatory Visit: Payer: PPO | Admitting: Radiation Oncology

## 2019-10-23 ENCOUNTER — Ambulatory Visit: Payer: PPO | Admitting: Occupational Therapy

## 2019-10-23 ENCOUNTER — Other Ambulatory Visit: Payer: Self-pay

## 2019-10-23 DIAGNOSIS — N61 Mastitis without abscess: Secondary | ICD-10-CM | POA: Diagnosis not present

## 2019-10-23 DIAGNOSIS — L905 Scar conditions and fibrosis of skin: Secondary | ICD-10-CM
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
## 2019-10-25 ENCOUNTER — Encounter: Payer: Self-pay | Admitting: Radiation Oncology

## 2019-10-25 ENCOUNTER — Ambulatory Visit
Admission: RE | Admit: 2019-10-25 | Discharge: 2019-10-25 | Disposition: A | Discharge status: Home / Self Care | Payer: PPO | Source: Ambulatory Visit | Attending: Radiation Oncology | Admitting: Radiation Oncology

## 2019-10-25 ENCOUNTER — Other Ambulatory Visit: Payer: Self-pay

## 2019-10-25 VITALS — BP 126/66 | HR 77 | Temp 98.2°F | Wt 223.0 lb

## 2019-10-25 DIAGNOSIS — I890 Lymphedema, not elsewhere classified: Secondary | ICD-10-CM | POA: Diagnosis not present

## 2019-10-25 DIAGNOSIS — Z923 Personal history of irradiation: Secondary | ICD-10-CM | POA: Diagnosis not present

## 2019-10-25 DIAGNOSIS — Z170 Estrogen receptor positive status [ER+]: Secondary | ICD-10-CM | POA: Diagnosis not present

## 2019-10-25 DIAGNOSIS — C50412 Malignant neoplasm of upper-outer quadrant of left female breast: Secondary | ICD-10-CM | POA: Diagnosis not present
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
## 2019-11-06 ENCOUNTER — Other Ambulatory Visit: Payer: Self-pay

## 2019-11-06 ENCOUNTER — Ambulatory Visit: Payer: PPO | Admitting: Occupational Therapy

## 2019-11-06 DIAGNOSIS — N61 Mastitis without abscess: Secondary | ICD-10-CM | POA: Diagnosis not present

## 2019-11-06 DIAGNOSIS — L905 Scar conditions and fibrosis of skin: Secondary | ICD-10-CM
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
## 2019-11-15 ENCOUNTER — Ambulatory Visit
Admission: RE | Admit: 2019-11-15 | Discharge: 2019-11-15 | Disposition: A | Discharge status: Home / Self Care | Payer: PPO | Source: Ambulatory Visit | Attending: Oncology | Admitting: Oncology

## 2019-11-15 ENCOUNTER — Other Ambulatory Visit: Payer: Self-pay

## 2019-11-15 DIAGNOSIS — Z853 Personal history of malignant neoplasm of breast: Secondary | ICD-10-CM | POA: Diagnosis not present

## 2019-11-15 DIAGNOSIS — C50412 Malignant neoplasm of upper-outer quadrant of left female breast: Secondary | ICD-10-CM | POA: Diagnosis not present

## 2019-11-15 DIAGNOSIS — M85852 Other specified disorders of bone density and structure, left thigh: Secondary | ICD-10-CM | POA: Insufficient documentation

## 2019-11-15 DIAGNOSIS — R928 Other abnormal and inconclusive findings on diagnostic imaging of breast: Secondary | ICD-10-CM | POA: Diagnosis not present

## 2019-11-15 DIAGNOSIS — Z780 Asymptomatic menopausal state: Secondary | ICD-10-CM | POA: Diagnosis not present

## 2019-11-24 DIAGNOSIS — I10 Essential (primary) hypertension: Secondary | ICD-10-CM | POA: Diagnosis not present

## 2019-11-24 DIAGNOSIS — G4733 Obstructive sleep apnea (adult) (pediatric): Secondary | ICD-10-CM | POA: Diagnosis not present
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
## 2019-12-04 ENCOUNTER — Encounter: Payer: PPO | Admitting: Occupational Therapy

## 2019-12-04 ENCOUNTER — Inpatient Hospital Stay: Payer: PPO | Admitting: Oncology
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
## 2019-12-14 ENCOUNTER — Encounter: Payer: Self-pay | Admitting: Oncology

## 2019-12-14 DIAGNOSIS — I25119 Atherosclerotic heart disease of native coronary artery with unspecified angina pectoris: Secondary | ICD-10-CM | POA: Diagnosis not present

## 2019-12-14 DIAGNOSIS — I10 Essential (primary) hypertension: Secondary | ICD-10-CM | POA: Diagnosis not present

## 2019-12-14 DIAGNOSIS — I480 Paroxysmal atrial fibrillation: Secondary | ICD-10-CM | POA: Diagnosis not present

## 2019-12-14 DIAGNOSIS — Z9989 Dependence on other enabling machines and devices: Secondary | ICD-10-CM | POA: Diagnosis not present

## 2019-12-14 DIAGNOSIS — Z6841 Body Mass Index (BMI) 40.0 and over, adult: Secondary | ICD-10-CM | POA: Diagnosis not present

## 2019-12-14 DIAGNOSIS — R0602 Shortness of breath: Secondary | ICD-10-CM | POA: Diagnosis not present

## 2019-12-14 DIAGNOSIS — G4733 Obstructive sleep apnea (adult) (pediatric): Secondary | ICD-10-CM | POA: Diagnosis not present

## 2019-12-14 DIAGNOSIS — E1169 Type 2 diabetes mellitus with other specified complication: Secondary | ICD-10-CM | POA: Diagnosis not present

## 2019-12-14 DIAGNOSIS — I208 Other forms of angina pectoris: Secondary | ICD-10-CM | POA: Diagnosis not present

## 2019-12-14 NOTE — Progress Notes (Signed)
Patient would like provider to discuss bone density results. States she had received mammography results but not heard anything about bone density. She denies other concerns at this time.

## 2019-12-15 ENCOUNTER — Encounter: Payer: Self-pay | Admitting: Oncology

## 2019-12-15 ENCOUNTER — Inpatient Hospital Stay: Payer: PPO | Attending: Oncology | Admitting: Oncology

## 2019-12-15 ENCOUNTER — Other Ambulatory Visit: Payer: Self-pay

## 2019-12-15 VITALS — BP 106/50 | HR 63 | Temp 98.0°F | Resp 20 | Wt 224.6 lb

## 2019-12-15 DIAGNOSIS — C50412 Malignant neoplasm of upper-outer quadrant of left female breast: Secondary | ICD-10-CM | POA: Diagnosis not present

## 2019-12-15 DIAGNOSIS — Z79811 Long term (current) use of aromatase inhibitors: Secondary | ICD-10-CM | POA: Insufficient documentation

## 2019-12-15 DIAGNOSIS — Z923 Personal history of irradiation: Secondary | ICD-10-CM | POA: Diagnosis not present

## 2019-12-15 DIAGNOSIS — Z170 Estrogen receptor positive status [ER+]: Secondary | ICD-10-CM | POA: Insufficient documentation

## 2019-12-15 DIAGNOSIS — Z79899 Other long term (current) drug therapy: Secondary | ICD-10-CM | POA: Diagnosis not present

## 2019-12-15 DIAGNOSIS — Z7901 Long term (current) use of anticoagulants: Secondary | ICD-10-CM | POA: Insufficient documentation

## 2019-12-15 DIAGNOSIS — M8580 Other specified disorders of bone density and structure, unspecified site: Secondary | ICD-10-CM | POA: Diagnosis not present

## 2019-12-15 DIAGNOSIS — Z8249 Family history of ischemic heart disease and other diseases of the circulatory system: Secondary | ICD-10-CM | POA: Diagnosis not present

## 2019-12-15 DIAGNOSIS — Z794 Long term (current) use of insulin: Secondary | ICD-10-CM | POA: Diagnosis not present

## 2019-12-15 DIAGNOSIS — Z833 Family history of diabetes mellitus: Secondary | ICD-10-CM | POA: Insufficient documentation

## 2019-12-15 DIAGNOSIS — I10 Essential (primary) hypertension: Secondary | ICD-10-CM | POA: Insufficient documentation

## 2019-12-15 DIAGNOSIS — G4733 Obstructive sleep apnea (adult) (pediatric): Secondary | ICD-10-CM | POA: Diagnosis not present

## 2019-12-15 DIAGNOSIS — E119 Type 2 diabetes mellitus without complications: Secondary | ICD-10-CM | POA: Insufficient documentation

## 2019-12-15 DIAGNOSIS — I2510 Atherosclerotic heart disease of native coronary artery without angina pectoris: Secondary | ICD-10-CM | POA: Diagnosis not present

## 2019-12-15 DIAGNOSIS — Z8261 Family history of arthritis: Secondary | ICD-10-CM | POA: Diagnosis not present

## 2019-12-27 DIAGNOSIS — E7800 Pure hypercholesterolemia, unspecified: Secondary | ICD-10-CM | POA: Diagnosis not present

## 2019-12-27 DIAGNOSIS — N184 Chronic kidney disease, stage 4 (severe): Secondary | ICD-10-CM | POA: Diagnosis not present

## 2019-12-27 DIAGNOSIS — I25119 Atherosclerotic heart disease of native coronary artery with unspecified angina pectoris: Secondary | ICD-10-CM | POA: Diagnosis not present

## 2019-12-27 DIAGNOSIS — E1122 Type 2 diabetes mellitus with diabetic chronic kidney disease: Secondary | ICD-10-CM | POA: Diagnosis not present

## 2020-01-01 DIAGNOSIS — I480 Paroxysmal atrial fibrillation: Secondary | ICD-10-CM | POA: Diagnosis not present

## 2020-01-01 DIAGNOSIS — Z0000 Encounter for general adult medical examination without abnormal findings: Secondary | ICD-10-CM | POA: Diagnosis not present

## 2020-01-01 DIAGNOSIS — E7800 Pure hypercholesterolemia, unspecified: Secondary | ICD-10-CM | POA: Diagnosis not present

## 2020-01-01 DIAGNOSIS — Z23 Encounter for immunization: Secondary | ICD-10-CM | POA: Diagnosis not present

## 2020-01-01 DIAGNOSIS — E1122 Type 2 diabetes mellitus with diabetic chronic kidney disease: Secondary | ICD-10-CM | POA: Diagnosis not present

## 2020-01-01 DIAGNOSIS — N184 Chronic kidney disease, stage 4 (severe): Secondary | ICD-10-CM | POA: Diagnosis not present

## 2020-01-01 DIAGNOSIS — G4733 Obstructive sleep apnea (adult) (pediatric): Secondary | ICD-10-CM | POA: Diagnosis not present

## 2020-01-01 DIAGNOSIS — Z9989 Dependence on other enabling machines and devices: Secondary | ICD-10-CM | POA: Diagnosis not present

## 2020-01-08 DIAGNOSIS — G4733 Obstructive sleep apnea (adult) (pediatric): Secondary | ICD-10-CM | POA: Diagnosis not present

## 2020-01-08 DIAGNOSIS — I10 Essential (primary) hypertension: Secondary | ICD-10-CM | POA: Diagnosis not present

## 2020-01-31 DIAGNOSIS — N184 Chronic kidney disease, stage 4 (severe): Secondary | ICD-10-CM | POA: Diagnosis not present

## 2020-02-05 DIAGNOSIS — E1129 Type 2 diabetes mellitus with other diabetic kidney complication: Secondary | ICD-10-CM | POA: Diagnosis not present

## 2020-02-05 DIAGNOSIS — I10 Essential (primary) hypertension: Secondary | ICD-10-CM | POA: Diagnosis not present

## 2020-02-05 DIAGNOSIS — R600 Localized edema: Secondary | ICD-10-CM | POA: Diagnosis not present

## 2020-02-05 DIAGNOSIS — N184 Chronic kidney disease, stage 4 (severe): Secondary | ICD-10-CM | POA: Diagnosis not present

## 2020-02-06 DIAGNOSIS — L570 Actinic keratosis: Secondary | ICD-10-CM | POA: Diagnosis not present

## 2020-02-06 DIAGNOSIS — L308 Other specified dermatitis: Secondary | ICD-10-CM | POA: Diagnosis not present

## 2020-02-06 DIAGNOSIS — D2262 Melanocytic nevi of left upper limb, including shoulder: Secondary | ICD-10-CM | POA: Diagnosis not present

## 2020-02-06 DIAGNOSIS — D2261 Melanocytic nevi of right upper limb, including shoulder: Secondary | ICD-10-CM | POA: Diagnosis not present

## 2020-02-06 DIAGNOSIS — D225 Melanocytic nevi of trunk: Secondary | ICD-10-CM | POA: Diagnosis not present

## 2020-02-06 DIAGNOSIS — Z85828 Personal history of other malignant neoplasm of skin: Secondary | ICD-10-CM | POA: Diagnosis not present

## 2020-02-06 DIAGNOSIS — D2272 Melanocytic nevi of left lower limb, including hip: Secondary | ICD-10-CM | POA: Diagnosis not present

## 2020-02-06 DIAGNOSIS — D485 Neoplasm of uncertain behavior of skin: Secondary | ICD-10-CM | POA: Diagnosis not present

## 2020-02-06 DIAGNOSIS — L821 Other seborrheic keratosis: Secondary | ICD-10-CM | POA: Diagnosis not present

## 2020-02-12 DIAGNOSIS — G4733 Obstructive sleep apnea (adult) (pediatric): Secondary | ICD-10-CM | POA: Diagnosis not present

## 2020-02-12 DIAGNOSIS — I10 Essential (primary) hypertension: Secondary | ICD-10-CM | POA: Diagnosis not present

## 2020-02-16 DIAGNOSIS — L570 Actinic keratosis: Secondary | ICD-10-CM | POA: Diagnosis not present

## 2020-03-14 DIAGNOSIS — I10 Essential (primary) hypertension: Secondary | ICD-10-CM | POA: Diagnosis not present

## 2020-03-14 DIAGNOSIS — G4733 Obstructive sleep apnea (adult) (pediatric): Secondary | ICD-10-CM | POA: Diagnosis not present

## 2020-03-26 ENCOUNTER — Encounter: Payer: Self-pay | Admitting: *Deleted

## 2020-04-17 DIAGNOSIS — I10 Essential (primary) hypertension: Secondary | ICD-10-CM | POA: Diagnosis not present

## 2020-04-17 DIAGNOSIS — G4733 Obstructive sleep apnea (adult) (pediatric): Secondary | ICD-10-CM | POA: Diagnosis not present

## 2020-04-19 IMAGING — DX DG CHEST 1V PORT
1 series · 1 of 1 positions shown · non-contrast
Comparison: Radiograph earlier this day.

CLINICAL DATA: Acute onset of shortness of breath.

EXAM:
PORTABLE CHEST 1 VIEW

[chest ap]
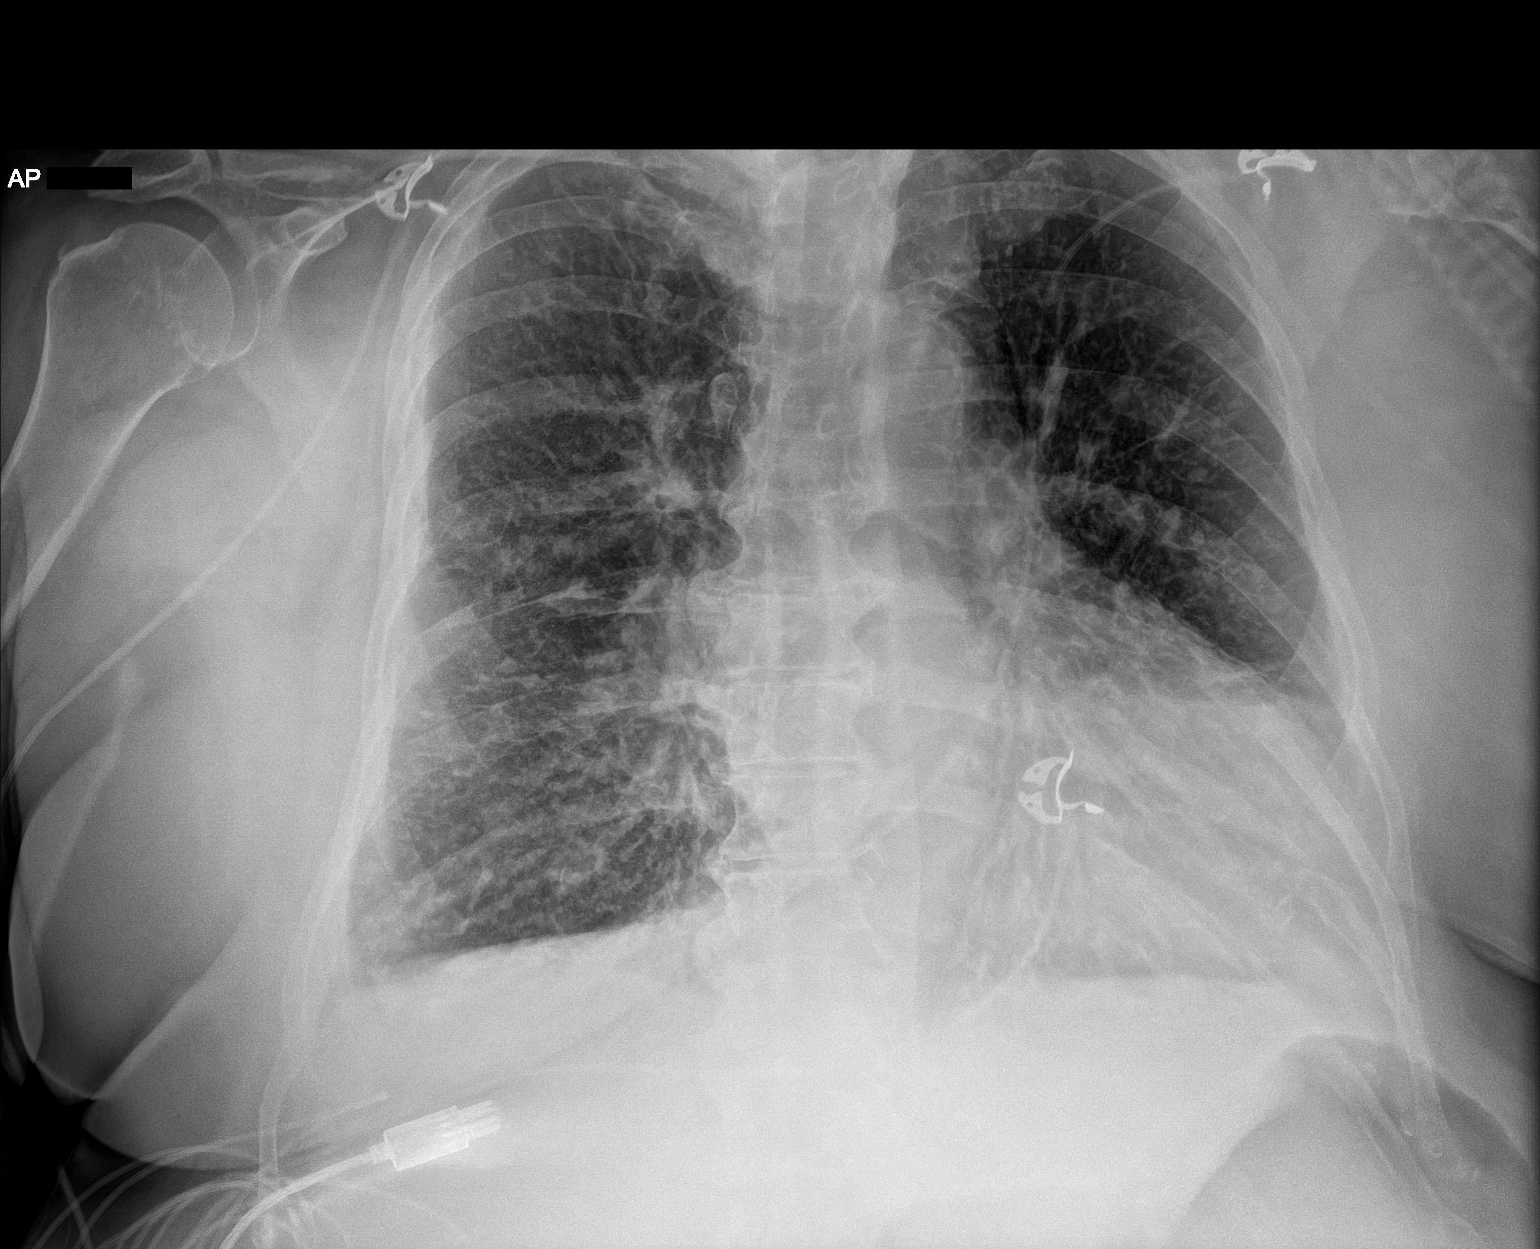

[1 of 1 positions shown; findings below may reference images not displayed]

FINDINGS: Unchanged cardiomegaly and mediastinal contours. Development of
pulmonary edema and small pleural effusions from prior exam. No
confluent airspace disease. No pneumothorax. No acute osseous
abnormalities.
IMPRESSION: Development of pulmonary edema and small pleural effusions from exam
earlier this day consistent with CHF.

## 2020-04-19 IMAGING — DX DG CHEST 1V PORT
1 series · 1 of 1 positions shown · non-contrast
Comparison: None.

CLINICAL DATA: Chills with unsteady gait.  Recent tick bite

EXAM:
PORTABLE CHEST 1 VIEW

[chest ap]
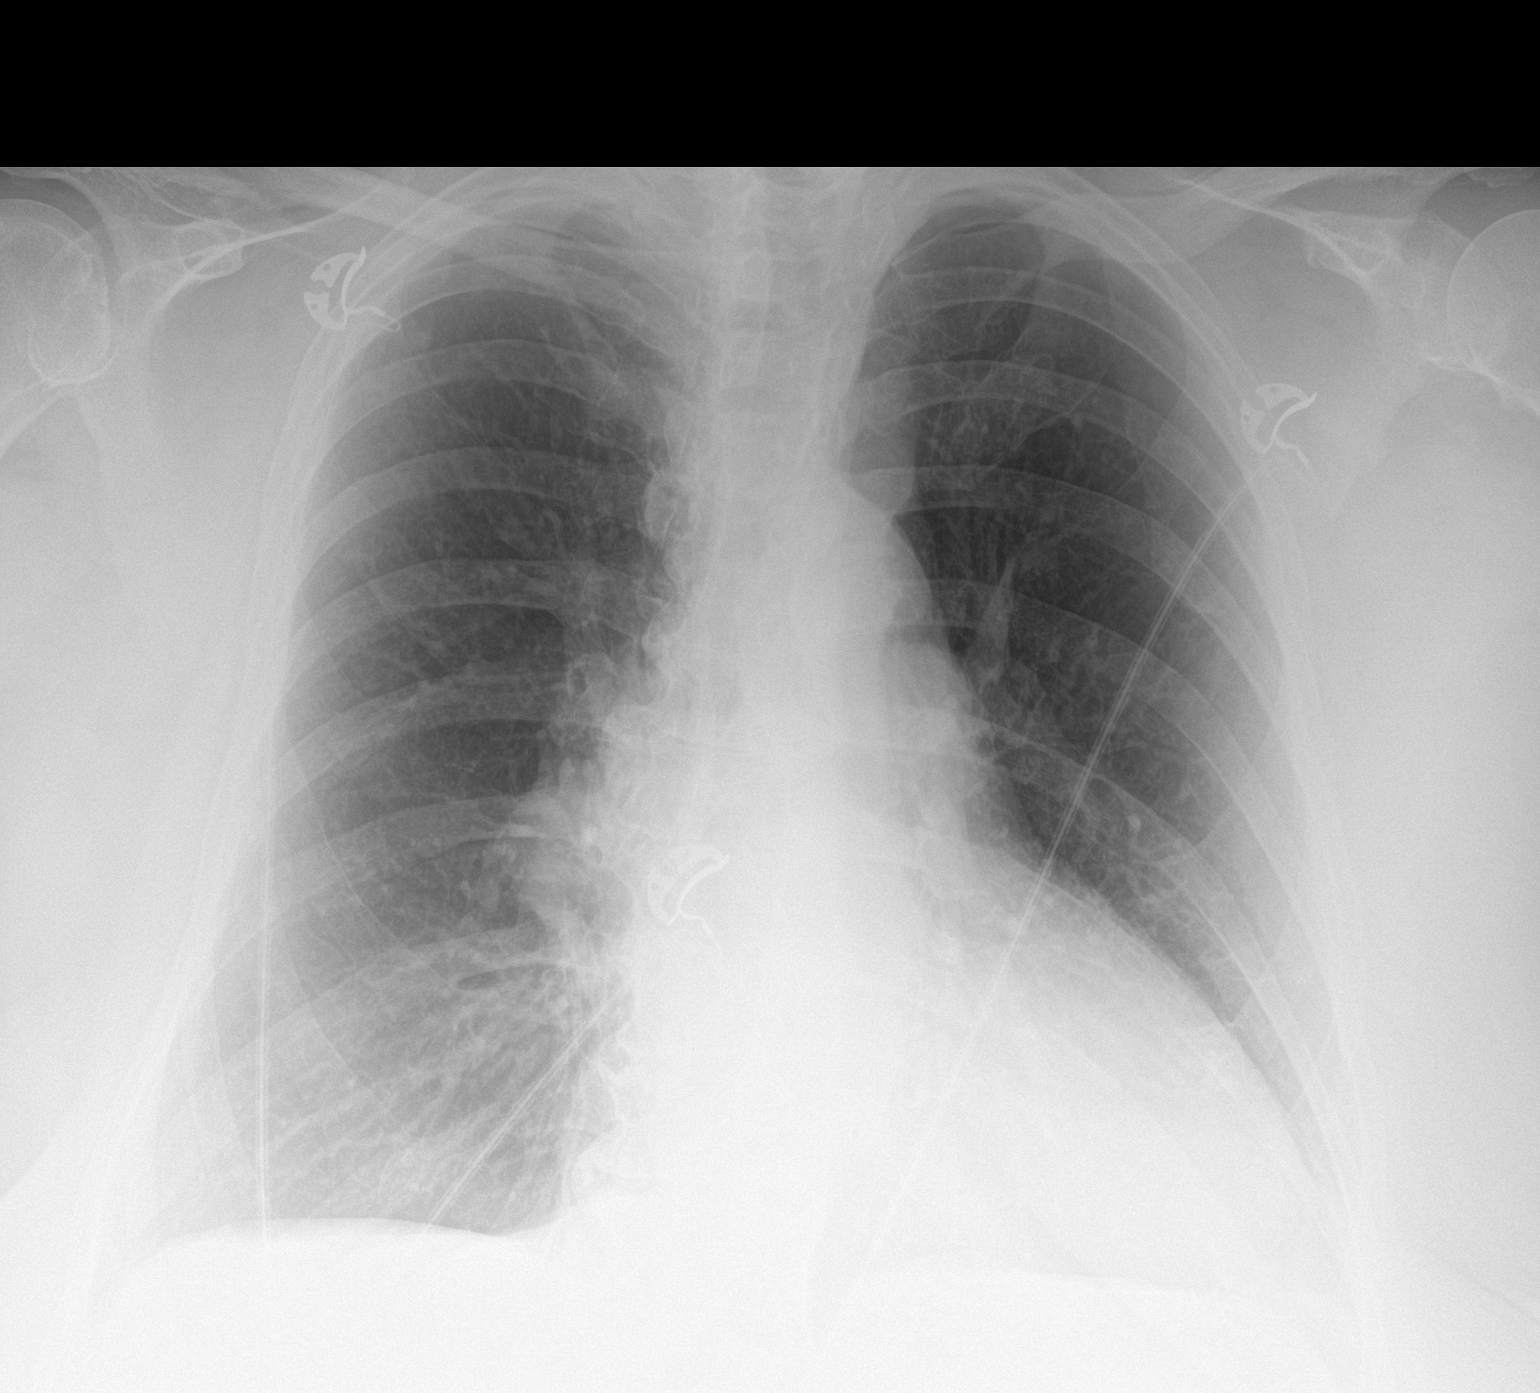

[1 of 1 positions shown; findings below may reference images not displayed]

FINDINGS: There is no edema or consolidation. Lungs are mildly hyperexpanded.
There is cardiomegaly. Pulmonary vascularity is normal. No
adenopathy evident. There is an old healed fracture of the left
clavicle.
IMPRESSION: Lungs mildly hyperexpanded without edema or consolidation. There is
cardiomegaly. No adenopathy evident.

## 2020-04-25 DIAGNOSIS — E7800 Pure hypercholesterolemia, unspecified: Secondary | ICD-10-CM | POA: Diagnosis not present

## 2020-04-25 DIAGNOSIS — N184 Chronic kidney disease, stage 4 (severe): Secondary | ICD-10-CM | POA: Diagnosis not present

## 2020-04-25 DIAGNOSIS — E1122 Type 2 diabetes mellitus with diabetic chronic kidney disease: Secondary | ICD-10-CM | POA: Diagnosis not present

## 2020-04-30 DIAGNOSIS — N184 Chronic kidney disease, stage 4 (severe): Secondary | ICD-10-CM | POA: Diagnosis not present

## 2020-05-02 DIAGNOSIS — Z6841 Body Mass Index (BMI) 40.0 and over, adult: Secondary | ICD-10-CM | POA: Diagnosis not present

## 2020-05-02 DIAGNOSIS — N184 Chronic kidney disease, stage 4 (severe): Secondary | ICD-10-CM | POA: Diagnosis not present

## 2020-05-02 DIAGNOSIS — E7800 Pure hypercholesterolemia, unspecified: Secondary | ICD-10-CM | POA: Diagnosis not present

## 2020-05-02 DIAGNOSIS — G4733 Obstructive sleep apnea (adult) (pediatric): Secondary | ICD-10-CM | POA: Diagnosis not present

## 2020-05-02 DIAGNOSIS — E1129 Type 2 diabetes mellitus with other diabetic kidney complication: Secondary | ICD-10-CM | POA: Diagnosis not present

## 2020-05-02 DIAGNOSIS — I25119 Atherosclerotic heart disease of native coronary artery with unspecified angina pectoris: Secondary | ICD-10-CM | POA: Diagnosis not present

## 2020-05-02 DIAGNOSIS — D472 Monoclonal gammopathy: Secondary | ICD-10-CM | POA: Diagnosis not present

## 2020-05-02 DIAGNOSIS — E1122 Type 2 diabetes mellitus with diabetic chronic kidney disease: Secondary | ICD-10-CM | POA: Diagnosis not present

## 2020-05-02 DIAGNOSIS — I480 Paroxysmal atrial fibrillation: Secondary | ICD-10-CM | POA: Diagnosis not present

## 2020-05-02 DIAGNOSIS — I10 Essential (primary) hypertension: Secondary | ICD-10-CM | POA: Diagnosis not present

## 2020-05-02 DIAGNOSIS — Z9989 Dependence on other enabling machines and devices: Secondary | ICD-10-CM | POA: Diagnosis not present

## 2020-05-27 DIAGNOSIS — E119 Type 2 diabetes mellitus without complications: Secondary | ICD-10-CM | POA: Diagnosis not present

## 2020-05-27 DIAGNOSIS — H524 Presbyopia: Secondary | ICD-10-CM | POA: Diagnosis not present

## 2020-05-27 DIAGNOSIS — H2513 Age-related nuclear cataract, bilateral: Secondary | ICD-10-CM | POA: Diagnosis not present

## 2020-05-27 DIAGNOSIS — H52223 Regular astigmatism, bilateral: Secondary | ICD-10-CM | POA: Diagnosis not present

## 2020-05-27 DIAGNOSIS — H5213 Myopia, bilateral: Secondary | ICD-10-CM | POA: Diagnosis not present

## 2020-05-27 DIAGNOSIS — Z7984 Long term (current) use of oral hypoglycemic drugs: Secondary | ICD-10-CM | POA: Diagnosis not present

## 2020-05-28 DIAGNOSIS — G4733 Obstructive sleep apnea (adult) (pediatric): Secondary | ICD-10-CM | POA: Diagnosis not present

## 2020-05-28 DIAGNOSIS — I10 Essential (primary) hypertension: Secondary | ICD-10-CM | POA: Diagnosis not present

## 2020-06-06 DIAGNOSIS — I10 Essential (primary) hypertension: Secondary | ICD-10-CM | POA: Diagnosis not present

## 2020-06-06 DIAGNOSIS — I2510 Atherosclerotic heart disease of native coronary artery without angina pectoris: Secondary | ICD-10-CM | POA: Diagnosis not present

## 2020-06-06 DIAGNOSIS — G4733 Obstructive sleep apnea (adult) (pediatric): Secondary | ICD-10-CM | POA: Diagnosis not present

## 2020-06-06 DIAGNOSIS — R0602 Shortness of breath: Secondary | ICD-10-CM | POA: Diagnosis not present

## 2020-06-06 DIAGNOSIS — I480 Paroxysmal atrial fibrillation: Secondary | ICD-10-CM | POA: Diagnosis not present

## 2020-06-06 DIAGNOSIS — Z9989 Dependence on other enabling machines and devices: Secondary | ICD-10-CM | POA: Diagnosis not present

## 2020-06-06 DIAGNOSIS — Z6841 Body Mass Index (BMI) 40.0 and over, adult: Secondary | ICD-10-CM | POA: Diagnosis not present

## 2020-06-06 DIAGNOSIS — I208 Other forms of angina pectoris: Secondary | ICD-10-CM | POA: Diagnosis not present

## 2020-06-06 DIAGNOSIS — E1169 Type 2 diabetes mellitus with other specified complication: Secondary | ICD-10-CM | POA: Diagnosis not present

## 2020-06-06 DIAGNOSIS — I25119 Atherosclerotic heart disease of native coronary artery with unspecified angina pectoris: Secondary | ICD-10-CM | POA: Diagnosis not present
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
## 2020-06-20 ENCOUNTER — Inpatient Hospital Stay: Payer: PPO | Attending: Oncology | Admitting: Oncology

## 2020-06-20 ENCOUNTER — Encounter: Payer: Self-pay | Admitting: Oncology

## 2020-06-20 VITALS — BP 112/57 | HR 75 | Temp 97.8°F | Resp 20 | Wt 212.0 lb

## 2020-06-20 DIAGNOSIS — Z79811 Long term (current) use of aromatase inhibitors: Secondary | ICD-10-CM | POA: Insufficient documentation

## 2020-06-20 DIAGNOSIS — Z170 Estrogen receptor positive status [ER+]: Secondary | ICD-10-CM | POA: Insufficient documentation

## 2020-06-20 DIAGNOSIS — I10 Essential (primary) hypertension: Secondary | ICD-10-CM | POA: Diagnosis not present

## 2020-06-20 DIAGNOSIS — C50412 Malignant neoplasm of upper-outer quadrant of left female breast: Secondary | ICD-10-CM | POA: Insufficient documentation

## 2020-06-20 DIAGNOSIS — M8580 Other specified disorders of bone density and structure, unspecified site: Secondary | ICD-10-CM | POA: Insufficient documentation

## 2020-06-20 DIAGNOSIS — E119 Type 2 diabetes mellitus without complications: Secondary | ICD-10-CM | POA: Diagnosis not present

## 2020-06-20 NOTE — Progress Notes (Signed)
Patient here today for follow up regarding breast cancer. Patient reports swelling and tenderness under right arm.

## 2020-07-10 DIAGNOSIS — I10 Essential (primary) hypertension: Secondary | ICD-10-CM | POA: Diagnosis not present

## 2020-07-10 DIAGNOSIS — G4733 Obstructive sleep apnea (adult) (pediatric): Secondary | ICD-10-CM | POA: Diagnosis not present

## 2020-07-23 DIAGNOSIS — N184 Chronic kidney disease, stage 4 (severe): Secondary | ICD-10-CM | POA: Diagnosis not present

## 2020-07-23 DIAGNOSIS — E1122 Type 2 diabetes mellitus with diabetic chronic kidney disease: Secondary | ICD-10-CM | POA: Diagnosis not present

## 2020-07-23 DIAGNOSIS — I25119 Atherosclerotic heart disease of native coronary artery with unspecified angina pectoris: Secondary | ICD-10-CM | POA: Diagnosis not present

## 2020-07-23 DIAGNOSIS — E7800 Pure hypercholesterolemia, unspecified: Secondary | ICD-10-CM | POA: Diagnosis not present

## 2020-07-30 DIAGNOSIS — Z9989 Dependence on other enabling machines and devices: Secondary | ICD-10-CM | POA: Diagnosis not present

## 2020-07-30 DIAGNOSIS — Z6841 Body Mass Index (BMI) 40.0 and over, adult: Secondary | ICD-10-CM | POA: Diagnosis not present

## 2020-07-30 DIAGNOSIS — I10 Essential (primary) hypertension: Secondary | ICD-10-CM | POA: Diagnosis not present

## 2020-07-30 DIAGNOSIS — I25119 Atherosclerotic heart disease of native coronary artery with unspecified angina pectoris: Secondary | ICD-10-CM | POA: Diagnosis not present

## 2020-07-30 DIAGNOSIS — E7800 Pure hypercholesterolemia, unspecified: Secondary | ICD-10-CM | POA: Diagnosis not present

## 2020-07-30 DIAGNOSIS — N184 Chronic kidney disease, stage 4 (severe): Secondary | ICD-10-CM | POA: Diagnosis not present

## 2020-07-30 DIAGNOSIS — I480 Paroxysmal atrial fibrillation: Secondary | ICD-10-CM | POA: Diagnosis not present

## 2020-07-30 DIAGNOSIS — E1122 Type 2 diabetes mellitus with diabetic chronic kidney disease: Secondary | ICD-10-CM | POA: Diagnosis not present

## 2020-07-30 DIAGNOSIS — G4733 Obstructive sleep apnea (adult) (pediatric): Secondary | ICD-10-CM | POA: Diagnosis not present

## 2020-08-05 DIAGNOSIS — D2262 Melanocytic nevi of left upper limb, including shoulder: Secondary | ICD-10-CM | POA: Diagnosis not present

## 2020-08-05 DIAGNOSIS — D2261 Melanocytic nevi of right upper limb, including shoulder: Secondary | ICD-10-CM | POA: Diagnosis not present

## 2020-08-05 DIAGNOSIS — D225 Melanocytic nevi of trunk: Secondary | ICD-10-CM | POA: Diagnosis not present

## 2020-08-05 DIAGNOSIS — D2272 Melanocytic nevi of left lower limb, including hip: Secondary | ICD-10-CM | POA: Diagnosis not present

## 2020-08-05 DIAGNOSIS — Z85828 Personal history of other malignant neoplasm of skin: Secondary | ICD-10-CM | POA: Diagnosis not present

## 2020-08-09 DIAGNOSIS — I10 Essential (primary) hypertension: Secondary | ICD-10-CM | POA: Diagnosis not present

## 2020-08-09 DIAGNOSIS — G4733 Obstructive sleep apnea (adult) (pediatric): Secondary | ICD-10-CM | POA: Diagnosis not present

## 2020-09-02 DIAGNOSIS — E1129 Type 2 diabetes mellitus with other diabetic kidney complication: Secondary | ICD-10-CM | POA: Diagnosis not present

## 2020-09-02 DIAGNOSIS — N184 Chronic kidney disease, stage 4 (severe): Secondary | ICD-10-CM | POA: Diagnosis not present

## 2020-09-02 DIAGNOSIS — I10 Essential (primary) hypertension: Secondary | ICD-10-CM | POA: Diagnosis not present

## 2020-09-02 DIAGNOSIS — R600 Localized edema: Secondary | ICD-10-CM | POA: Diagnosis not present

## 2020-09-09 DIAGNOSIS — G4733 Obstructive sleep apnea (adult) (pediatric): Secondary | ICD-10-CM | POA: Diagnosis not present

## 2020-09-09 DIAGNOSIS — I10 Essential (primary) hypertension: Secondary | ICD-10-CM | POA: Diagnosis not present

## 2020-10-09 DIAGNOSIS — I10 Essential (primary) hypertension: Secondary | ICD-10-CM | POA: Diagnosis not present

## 2020-10-09 DIAGNOSIS — G4733 Obstructive sleep apnea (adult) (pediatric): Secondary | ICD-10-CM | POA: Diagnosis not present

## 2020-10-24 ENCOUNTER — Ambulatory Visit: Payer: PPO | Admitting: Radiation Oncology

## 2020-11-07 ENCOUNTER — Encounter: Payer: Self-pay | Admitting: Radiation Oncology

## 2020-11-07 ENCOUNTER — Ambulatory Visit
Admission: RE | Admit: 2020-11-07 | Discharge: 2020-11-07 | Disposition: A | Discharge status: Home / Self Care | Payer: PPO | Source: Ambulatory Visit | Attending: Radiation Oncology | Admitting: Radiation Oncology

## 2020-11-07 VITALS — BP 159/67 | HR 65 | Temp 97.5°F | Wt 228.0 lb

## 2020-11-07 DIAGNOSIS — Z923 Personal history of irradiation: Secondary | ICD-10-CM | POA: Insufficient documentation

## 2020-11-07 DIAGNOSIS — Z170 Estrogen receptor positive status [ER+]: Secondary | ICD-10-CM | POA: Insufficient documentation

## 2020-11-07 DIAGNOSIS — C50412 Malignant neoplasm of upper-outer quadrant of left female breast: Secondary | ICD-10-CM | POA: Diagnosis not present

## 2020-11-07 DIAGNOSIS — Z79811 Long term (current) use of aromatase inhibitors: Secondary | ICD-10-CM | POA: Diagnosis not present

## 2020-11-07 NOTE — Progress Notes (Signed)
Radiation Oncology Follow up Note  Name: Bonnie Holt   Date:   11/07/2020 MRN:  [id number] DOB: October 28, 1945    This 75 y.o. female presents to the clinic today for 3 and half year follow-up status post whole breast radiation to her left breast for stage I ER/PR positive invasive mammary carcinoma.  REFERRING PROVIDER: Kirk Ruths, MD  HPI: Patient is a 75 year old female now about 3-1/2 years having completed whole breast radiation to her left breast for stage I ER/PR positive invasive mammary carcinoma.  Seen today in routine follow-up she is doing well.  She specifically denies breast tenderness cough or bone pain..  She had mammograms which I have reviewed back in September which were BI-RADS 2 benign she is scheduled for follow-up mammograms in October.  She is currently on letrozole tolerating that well without side effect.  COMPLICATIONS OF TREATMENT: none  FOLLOW UP COMPLIANCE: keeps appointments   PHYSICAL EXAM:  BP (!) 159/67   Pulse 65   Temp (!) 97.5 F (36.4 C) (Tympanic)   Wt 228 lb (103.4 kg)   BMI 41.70 kg/m  Lungs are clear to A&P cardiac examination essentially unremarkable with regular rate and rhythm. No dominant mass or nodularity is noted in either breast in 2 positions examined. Incision is well-healed. No axillary or supraclavicular adenopathy is appreciated. Cosmetic result is excellent.  Well-developed well-nourished patient in NAD. HEENT reveals PERLA, EOMI, discs not visualized.  Oral cavity is clear. No oral mucosal lesions are identified. Neck is clear without evidence of cervical or supraclavicular adenopathy. Lungs are clear to A&P. Cardiac examination is essentially unremarkable with regular rate and rhythm without murmur rub or thrill. Abdomen is benign with no organomegaly or masses noted. Motor sensory and DTR levels are equal and symmetric in the upper and lower extremities. Cranial nerves II through XII are grossly intact. Proprioception is  intact. No peripheral adenopathy or edema is identified. No motor or sensory levels are noted. Crude visual fields are within normal range.  RADIOLOGY RESULTS: Mammograms reviewed compatible with above-stated findings  PLAN: Present time she is now 3-1/2 years out from whole breast radiation.  And pleased with her overall progress.  I have asked to see her back in 1 year and then will discontinue follow-up care.  She continues on letrozole without side effect.  Patient knows to call with any concerns.  She continues follow-up care with medical oncology.  I would like to take this opportunity to thank you for allowing me to participate in the care of your patient.Noreene Filbert, MD

## 2020-11-09 DIAGNOSIS — I10 Essential (primary) hypertension: Secondary | ICD-10-CM | POA: Diagnosis not present

## 2020-11-09 DIAGNOSIS — G4733 Obstructive sleep apnea (adult) (pediatric): Secondary | ICD-10-CM | POA: Diagnosis not present

## 2020-11-28 DIAGNOSIS — E7800 Pure hypercholesterolemia, unspecified: Secondary | ICD-10-CM | POA: Diagnosis not present

## 2020-11-28 DIAGNOSIS — I480 Paroxysmal atrial fibrillation: Secondary | ICD-10-CM | POA: Diagnosis not present

## 2020-11-28 DIAGNOSIS — N184 Chronic kidney disease, stage 4 (severe): Secondary | ICD-10-CM | POA: Diagnosis not present

## 2020-11-28 DIAGNOSIS — E1122 Type 2 diabetes mellitus with diabetic chronic kidney disease: Secondary | ICD-10-CM | POA: Diagnosis not present

## 2020-12-05 DIAGNOSIS — I10 Essential (primary) hypertension: Secondary | ICD-10-CM | POA: Diagnosis not present

## 2020-12-05 DIAGNOSIS — Z0000 Encounter for general adult medical examination without abnormal findings: Secondary | ICD-10-CM | POA: Diagnosis not present

## 2020-12-05 DIAGNOSIS — Z23 Encounter for immunization: Secondary | ICD-10-CM | POA: Diagnosis not present

## 2020-12-05 DIAGNOSIS — I480 Paroxysmal atrial fibrillation: Secondary | ICD-10-CM | POA: Diagnosis not present

## 2020-12-05 DIAGNOSIS — I25119 Atherosclerotic heart disease of native coronary artery with unspecified angina pectoris: Secondary | ICD-10-CM | POA: Diagnosis not present

## 2020-12-05 DIAGNOSIS — Z6841 Body Mass Index (BMI) 40.0 and over, adult: Secondary | ICD-10-CM | POA: Diagnosis not present

## 2020-12-05 DIAGNOSIS — N184 Chronic kidney disease, stage 4 (severe): Secondary | ICD-10-CM | POA: Diagnosis not present

## 2020-12-05 DIAGNOSIS — Z9989 Dependence on other enabling machines and devices: Secondary | ICD-10-CM | POA: Diagnosis not present

## 2020-12-05 DIAGNOSIS — G4733 Obstructive sleep apnea (adult) (pediatric): Secondary | ICD-10-CM | POA: Diagnosis not present

## 2020-12-05 DIAGNOSIS — E1122 Type 2 diabetes mellitus with diabetic chronic kidney disease: Secondary | ICD-10-CM | POA: Diagnosis not present

## 2020-12-09 DIAGNOSIS — G4733 Obstructive sleep apnea (adult) (pediatric): Secondary | ICD-10-CM | POA: Diagnosis not present

## 2020-12-09 DIAGNOSIS — I10 Essential (primary) hypertension: Secondary | ICD-10-CM | POA: Diagnosis not present

## 2020-12-12 DIAGNOSIS — G4733 Obstructive sleep apnea (adult) (pediatric): Secondary | ICD-10-CM | POA: Diagnosis not present

## 2020-12-12 DIAGNOSIS — E1169 Type 2 diabetes mellitus with other specified complication: Secondary | ICD-10-CM | POA: Diagnosis not present

## 2020-12-12 DIAGNOSIS — R0602 Shortness of breath: Secondary | ICD-10-CM | POA: Diagnosis not present

## 2020-12-12 DIAGNOSIS — Z6841 Body Mass Index (BMI) 40.0 and over, adult: Secondary | ICD-10-CM | POA: Diagnosis not present

## 2020-12-12 DIAGNOSIS — I10 Essential (primary) hypertension: Secondary | ICD-10-CM | POA: Diagnosis not present

## 2020-12-12 DIAGNOSIS — Z9989 Dependence on other enabling machines and devices: Secondary | ICD-10-CM | POA: Diagnosis not present

## 2020-12-12 DIAGNOSIS — I25119 Atherosclerotic heart disease of native coronary artery with unspecified angina pectoris: Secondary | ICD-10-CM | POA: Diagnosis not present

## 2020-12-12 DIAGNOSIS — I480 Paroxysmal atrial fibrillation: Secondary | ICD-10-CM | POA: Diagnosis not present

## 2020-12-12 DIAGNOSIS — I208 Other forms of angina pectoris: Secondary | ICD-10-CM | POA: Diagnosis not present

## 2020-12-12 DIAGNOSIS — I2510 Atherosclerotic heart disease of native coronary artery without angina pectoris: Secondary | ICD-10-CM | POA: Diagnosis not present

## 2020-12-16 ENCOUNTER — Ambulatory Visit
Admission: RE | Admit: 2020-12-16 | Discharge: 2020-12-16 | Disposition: A | Discharge status: Home / Self Care | Payer: PPO | Source: Ambulatory Visit | Attending: Oncology | Admitting: Oncology

## 2020-12-16 ENCOUNTER — Other Ambulatory Visit: Payer: Self-pay

## 2020-12-16 ENCOUNTER — Other Ambulatory Visit: Payer: Self-pay | Admitting: Oncology

## 2020-12-16 DIAGNOSIS — Z1231 Encounter for screening mammogram for malignant neoplasm of breast: Secondary | ICD-10-CM

## 2020-12-16 DIAGNOSIS — Z780 Asymptomatic menopausal state: Secondary | ICD-10-CM | POA: Insufficient documentation

## 2020-12-16 DIAGNOSIS — E119 Type 2 diabetes mellitus without complications: Secondary | ICD-10-CM | POA: Diagnosis not present

## 2020-12-16 DIAGNOSIS — Z13820 Encounter for screening for osteoporosis: Secondary | ICD-10-CM | POA: Diagnosis not present

## 2020-12-16 DIAGNOSIS — Z923 Personal history of irradiation: Secondary | ICD-10-CM | POA: Insufficient documentation

## 2020-12-16 DIAGNOSIS — M85852 Other specified disorders of bone density and structure, left thigh: Secondary | ICD-10-CM | POA: Diagnosis not present

## 2020-12-16 DIAGNOSIS — Z853 Personal history of malignant neoplasm of breast: Secondary | ICD-10-CM | POA: Insufficient documentation

## 2020-12-16 DIAGNOSIS — C50412 Malignant neoplasm of upper-outer quadrant of left female breast: Secondary | ICD-10-CM

## 2020-12-23 ENCOUNTER — Inpatient Hospital Stay: Payer: PPO | Attending: Oncology | Admitting: Oncology

## 2020-12-23 ENCOUNTER — Other Ambulatory Visit: Payer: Self-pay

## 2020-12-23 VITALS — BP 135/45 | HR 72 | Temp 98.2°F | Resp 16 | Wt 213.8 lb

## 2020-12-23 DIAGNOSIS — C50412 Malignant neoplasm of upper-outer quadrant of left female breast: Secondary | ICD-10-CM | POA: Diagnosis not present

## 2020-12-23 DIAGNOSIS — Z170 Estrogen receptor positive status [ER+]: Secondary | ICD-10-CM | POA: Insufficient documentation

## 2020-12-23 DIAGNOSIS — M8580 Other specified disorders of bone density and structure, unspecified site: Secondary | ICD-10-CM | POA: Insufficient documentation

## 2020-12-23 DIAGNOSIS — Z79811 Long term (current) use of aromatase inhibitors: Secondary | ICD-10-CM | POA: Diagnosis not present

## 2020-12-23 NOTE — Progress Notes (Signed)
Pt has no concerns/complaints at this time. 
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
## 2021-01-02 DIAGNOSIS — N184 Chronic kidney disease, stage 4 (severe): Secondary | ICD-10-CM | POA: Diagnosis not present

## 2021-01-06 DIAGNOSIS — N1832 Chronic kidney disease, stage 3b: Secondary | ICD-10-CM | POA: Diagnosis not present

## 2021-01-06 DIAGNOSIS — I10 Essential (primary) hypertension: Secondary | ICD-10-CM | POA: Diagnosis not present

## 2021-01-06 DIAGNOSIS — R600 Localized edema: Secondary | ICD-10-CM | POA: Diagnosis not present

## 2021-01-06 DIAGNOSIS — E1129 Type 2 diabetes mellitus with other diabetic kidney complication: Secondary | ICD-10-CM | POA: Diagnosis not present

## 2021-01-06 DIAGNOSIS — D472 Monoclonal gammopathy: Secondary | ICD-10-CM | POA: Diagnosis not present

## 2021-01-08 DIAGNOSIS — I10 Essential (primary) hypertension: Secondary | ICD-10-CM | POA: Diagnosis not present

## 2021-01-08 DIAGNOSIS — G4733 Obstructive sleep apnea (adult) (pediatric): Secondary | ICD-10-CM | POA: Diagnosis not present

## 2021-02-03 DIAGNOSIS — I10 Essential (primary) hypertension: Secondary | ICD-10-CM | POA: Diagnosis not present

## 2021-02-03 DIAGNOSIS — Z7984 Long term (current) use of oral hypoglycemic drugs: Secondary | ICD-10-CM | POA: Diagnosis not present

## 2021-02-03 DIAGNOSIS — Z794 Long term (current) use of insulin: Secondary | ICD-10-CM | POA: Diagnosis not present

## 2021-02-03 DIAGNOSIS — D6869 Other thrombophilia: Secondary | ICD-10-CM | POA: Diagnosis not present

## 2021-02-03 DIAGNOSIS — E119 Type 2 diabetes mellitus without complications: Secondary | ICD-10-CM | POA: Diagnosis not present

## 2021-02-03 DIAGNOSIS — I480 Paroxysmal atrial fibrillation: Secondary | ICD-10-CM | POA: Diagnosis not present

## 2021-02-07 DIAGNOSIS — I10 Essential (primary) hypertension: Secondary | ICD-10-CM | POA: Diagnosis not present

## 2021-02-07 DIAGNOSIS — G4733 Obstructive sleep apnea (adult) (pediatric): Secondary | ICD-10-CM | POA: Diagnosis not present

## 2021-03-11 DIAGNOSIS — G4733 Obstructive sleep apnea (adult) (pediatric): Secondary | ICD-10-CM | POA: Diagnosis not present

## 2021-03-11 DIAGNOSIS — I10 Essential (primary) hypertension: Secondary | ICD-10-CM | POA: Diagnosis not present

## 2021-04-15 DIAGNOSIS — G4733 Obstructive sleep apnea (adult) (pediatric): Secondary | ICD-10-CM | POA: Diagnosis not present

## 2021-04-15 DIAGNOSIS — I10 Essential (primary) hypertension: Secondary | ICD-10-CM | POA: Diagnosis not present

## 2021-05-01 DIAGNOSIS — D692 Other nonthrombocytopenic purpura: Secondary | ICD-10-CM | POA: Diagnosis not present

## 2021-05-01 DIAGNOSIS — E1169 Type 2 diabetes mellitus with other specified complication: Secondary | ICD-10-CM | POA: Diagnosis not present

## 2021-05-01 DIAGNOSIS — E1159 Type 2 diabetes mellitus with other circulatory complications: Secondary | ICD-10-CM | POA: Diagnosis not present

## 2021-05-01 DIAGNOSIS — E1151 Type 2 diabetes mellitus with diabetic peripheral angiopathy without gangrene: Secondary | ICD-10-CM | POA: Diagnosis not present

## 2021-05-01 DIAGNOSIS — C50512 Malignant neoplasm of lower-outer quadrant of left female breast: Secondary | ICD-10-CM | POA: Diagnosis not present

## 2021-05-01 DIAGNOSIS — I70203 Unspecified atherosclerosis of native arteries of extremities, bilateral legs: Secondary | ICD-10-CM | POA: Diagnosis not present

## 2021-05-01 DIAGNOSIS — I480 Paroxysmal atrial fibrillation: Secondary | ICD-10-CM | POA: Diagnosis not present

## 2021-05-01 DIAGNOSIS — N184 Chronic kidney disease, stage 4 (severe): Secondary | ICD-10-CM | POA: Diagnosis not present

## 2021-05-01 DIAGNOSIS — I2510 Atherosclerotic heart disease of native coronary artery without angina pectoris: Secondary | ICD-10-CM | POA: Diagnosis not present

## 2021-05-01 DIAGNOSIS — I509 Heart failure, unspecified: Secondary | ICD-10-CM | POA: Diagnosis not present

## 2021-05-01 DIAGNOSIS — E1122 Type 2 diabetes mellitus with diabetic chronic kidney disease: Secondary | ICD-10-CM | POA: Diagnosis not present

## 2021-05-05 DIAGNOSIS — I10 Essential (primary) hypertension: Secondary | ICD-10-CM | POA: Diagnosis not present

## 2021-05-05 DIAGNOSIS — E1129 Type 2 diabetes mellitus with other diabetic kidney complication: Secondary | ICD-10-CM | POA: Diagnosis not present

## 2021-05-05 DIAGNOSIS — R8290 Unspecified abnormal findings in urine: Secondary | ICD-10-CM | POA: Diagnosis not present

## 2021-05-05 DIAGNOSIS — R600 Localized edema: Secondary | ICD-10-CM | POA: Diagnosis not present

## 2021-05-05 DIAGNOSIS — D472 Monoclonal gammopathy: Secondary | ICD-10-CM | POA: Diagnosis not present

## 2021-05-05 DIAGNOSIS — N1832 Chronic kidney disease, stage 3b: Secondary | ICD-10-CM | POA: Diagnosis not present

## 2021-05-06 DIAGNOSIS — I480 Paroxysmal atrial fibrillation: Secondary | ICD-10-CM | POA: Diagnosis not present

## 2021-05-06 DIAGNOSIS — I10 Essential (primary) hypertension: Secondary | ICD-10-CM | POA: Diagnosis not present

## 2021-05-06 DIAGNOSIS — N184 Chronic kidney disease, stage 4 (severe): Secondary | ICD-10-CM | POA: Diagnosis not present

## 2021-05-06 DIAGNOSIS — I25119 Atherosclerotic heart disease of native coronary artery with unspecified angina pectoris: Secondary | ICD-10-CM | POA: Diagnosis not present

## 2021-05-06 DIAGNOSIS — E1122 Type 2 diabetes mellitus with diabetic chronic kidney disease: Secondary | ICD-10-CM | POA: Diagnosis not present

## 2021-05-08 DIAGNOSIS — N184 Chronic kidney disease, stage 4 (severe): Secondary | ICD-10-CM | POA: Diagnosis not present

## 2021-05-08 DIAGNOSIS — E1129 Type 2 diabetes mellitus with other diabetic kidney complication: Secondary | ICD-10-CM | POA: Diagnosis not present

## 2021-05-08 DIAGNOSIS — I10 Essential (primary) hypertension: Secondary | ICD-10-CM | POA: Diagnosis not present

## 2021-05-08 DIAGNOSIS — N2581 Secondary hyperparathyroidism of renal origin: Secondary | ICD-10-CM | POA: Diagnosis not present

## 2021-05-16 DIAGNOSIS — G4733 Obstructive sleep apnea (adult) (pediatric): Secondary | ICD-10-CM | POA: Diagnosis not present

## 2021-05-16 DIAGNOSIS — I10 Essential (primary) hypertension: Secondary | ICD-10-CM | POA: Diagnosis not present

## 2021-06-03 DIAGNOSIS — I480 Paroxysmal atrial fibrillation: Secondary | ICD-10-CM | POA: Diagnosis not present

## 2021-06-03 DIAGNOSIS — E1122 Type 2 diabetes mellitus with diabetic chronic kidney disease: Secondary | ICD-10-CM | POA: Diagnosis not present

## 2021-06-03 DIAGNOSIS — I10 Essential (primary) hypertension: Secondary | ICD-10-CM | POA: Diagnosis not present

## 2021-06-03 DIAGNOSIS — N184 Chronic kidney disease, stage 4 (severe): Secondary | ICD-10-CM | POA: Diagnosis not present

## 2021-06-05 DIAGNOSIS — I10 Essential (primary) hypertension: Secondary | ICD-10-CM | POA: Diagnosis not present

## 2021-06-05 DIAGNOSIS — Z6841 Body Mass Index (BMI) 40.0 and over, adult: Secondary | ICD-10-CM | POA: Diagnosis not present

## 2021-06-05 DIAGNOSIS — Z9989 Dependence on other enabling machines and devices: Secondary | ICD-10-CM | POA: Diagnosis not present

## 2021-06-05 DIAGNOSIS — Z0000 Encounter for general adult medical examination without abnormal findings: Secondary | ICD-10-CM | POA: Diagnosis not present

## 2021-06-05 DIAGNOSIS — G4733 Obstructive sleep apnea (adult) (pediatric): Secondary | ICD-10-CM | POA: Diagnosis not present

## 2021-06-05 DIAGNOSIS — C50412 Malignant neoplasm of upper-outer quadrant of left female breast: Secondary | ICD-10-CM | POA: Diagnosis not present

## 2021-06-05 DIAGNOSIS — I25119 Atherosclerotic heart disease of native coronary artery with unspecified angina pectoris: Secondary | ICD-10-CM | POA: Diagnosis not present

## 2021-06-05 DIAGNOSIS — I480 Paroxysmal atrial fibrillation: Secondary | ICD-10-CM | POA: Diagnosis not present

## 2021-06-05 DIAGNOSIS — E1122 Type 2 diabetes mellitus with diabetic chronic kidney disease: Secondary | ICD-10-CM | POA: Diagnosis not present

## 2021-06-05 DIAGNOSIS — N184 Chronic kidney disease, stage 4 (severe): Secondary | ICD-10-CM | POA: Diagnosis not present

## 2021-06-12 DIAGNOSIS — Z79899 Other long term (current) drug therapy: Secondary | ICD-10-CM | POA: Diagnosis not present

## 2021-06-12 DIAGNOSIS — E669 Obesity, unspecified: Secondary | ICD-10-CM | POA: Diagnosis not present

## 2021-06-12 DIAGNOSIS — I2510 Atherosclerotic heart disease of native coronary artery without angina pectoris: Secondary | ICD-10-CM | POA: Diagnosis not present

## 2021-06-12 DIAGNOSIS — Z794 Long term (current) use of insulin: Secondary | ICD-10-CM | POA: Diagnosis not present

## 2021-06-12 DIAGNOSIS — I10 Essential (primary) hypertension: Secondary | ICD-10-CM | POA: Diagnosis not present

## 2021-06-12 DIAGNOSIS — J439 Emphysema, unspecified: Secondary | ICD-10-CM | POA: Diagnosis not present

## 2021-06-12 DIAGNOSIS — E782 Mixed hyperlipidemia: Secondary | ICD-10-CM | POA: Diagnosis not present

## 2021-06-12 DIAGNOSIS — I480 Paroxysmal atrial fibrillation: Secondary | ICD-10-CM | POA: Diagnosis not present

## 2021-06-12 DIAGNOSIS — E119 Type 2 diabetes mellitus without complications: Secondary | ICD-10-CM | POA: Diagnosis not present

## 2021-06-12 DIAGNOSIS — R001 Bradycardia, unspecified: Secondary | ICD-10-CM | POA: Diagnosis not present

## 2021-06-12 DIAGNOSIS — G4733 Obstructive sleep apnea (adult) (pediatric): Secondary | ICD-10-CM | POA: Diagnosis not present

## 2021-06-16 DIAGNOSIS — H52223 Regular astigmatism, bilateral: Secondary | ICD-10-CM | POA: Diagnosis not present

## 2021-06-16 DIAGNOSIS — H524 Presbyopia: Secondary | ICD-10-CM | POA: Diagnosis not present

## 2021-06-16 DIAGNOSIS — E119 Type 2 diabetes mellitus without complications: Secondary | ICD-10-CM | POA: Diagnosis not present

## 2021-06-16 DIAGNOSIS — Z7984 Long term (current) use of oral hypoglycemic drugs: Secondary | ICD-10-CM | POA: Diagnosis not present

## 2021-06-16 DIAGNOSIS — H5213 Myopia, bilateral: Secondary | ICD-10-CM | POA: Diagnosis not present

## 2021-06-16 DIAGNOSIS — H2513 Age-related nuclear cataract, bilateral: Secondary | ICD-10-CM | POA: Diagnosis not present

## 2021-06-19 DIAGNOSIS — I10 Essential (primary) hypertension: Secondary | ICD-10-CM | POA: Diagnosis not present

## 2021-06-19 DIAGNOSIS — G4733 Obstructive sleep apnea (adult) (pediatric): Secondary | ICD-10-CM | POA: Diagnosis not present
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
## 2021-06-24 ENCOUNTER — Inpatient Hospital Stay: Payer: PPO | Attending: Oncology | Admitting: Oncology

## 2021-06-24 VITALS — BP 153/81 | HR 57 | Temp 98.2°F | Resp 16 | Ht 62.0 in | Wt 212.9 lb

## 2021-06-24 DIAGNOSIS — C50412 Malignant neoplasm of upper-outer quadrant of left female breast: Secondary | ICD-10-CM | POA: Insufficient documentation

## 2021-06-24 DIAGNOSIS — Z79811 Long term (current) use of aromatase inhibitors: Secondary | ICD-10-CM | POA: Insufficient documentation

## 2021-06-24 DIAGNOSIS — M8580 Other specified disorders of bone density and structure, unspecified site: Secondary | ICD-10-CM | POA: Diagnosis not present

## 2021-06-24 DIAGNOSIS — Z170 Estrogen receptor positive status [ER+]: Secondary | ICD-10-CM | POA: Insufficient documentation

## 2021-07-22 DIAGNOSIS — G4733 Obstructive sleep apnea (adult) (pediatric): Secondary | ICD-10-CM | POA: Diagnosis not present

## 2021-07-22 DIAGNOSIS — I10 Essential (primary) hypertension: Secondary | ICD-10-CM | POA: Diagnosis not present

## 2021-07-31 DIAGNOSIS — M1712 Unilateral primary osteoarthritis, left knee: Secondary | ICD-10-CM | POA: Diagnosis not present

## 2021-08-05 DIAGNOSIS — D2271 Melanocytic nevi of right lower limb, including hip: Secondary | ICD-10-CM | POA: Diagnosis not present

## 2021-08-05 DIAGNOSIS — D2272 Melanocytic nevi of left lower limb, including hip: Secondary | ICD-10-CM | POA: Diagnosis not present

## 2021-08-05 DIAGNOSIS — L728 Other follicular cysts of the skin and subcutaneous tissue: Secondary | ICD-10-CM | POA: Diagnosis not present

## 2021-08-05 DIAGNOSIS — Z85828 Personal history of other malignant neoplasm of skin: Secondary | ICD-10-CM | POA: Diagnosis not present

## 2021-08-05 DIAGNOSIS — D2262 Melanocytic nevi of left upper limb, including shoulder: Secondary | ICD-10-CM | POA: Diagnosis not present

## 2021-08-06 DIAGNOSIS — E1129 Type 2 diabetes mellitus with other diabetic kidney complication: Secondary | ICD-10-CM | POA: Diagnosis not present

## 2021-08-06 DIAGNOSIS — N184 Chronic kidney disease, stage 4 (severe): Secondary | ICD-10-CM | POA: Diagnosis not present

## 2021-08-06 DIAGNOSIS — I10 Essential (primary) hypertension: Secondary | ICD-10-CM | POA: Diagnosis not present

## 2021-08-06 DIAGNOSIS — N1832 Chronic kidney disease, stage 3b: Secondary | ICD-10-CM | POA: Diagnosis not present

## 2021-08-06 DIAGNOSIS — N2581 Secondary hyperparathyroidism of renal origin: Secondary | ICD-10-CM | POA: Diagnosis not present

## 2021-08-06 DIAGNOSIS — R8290 Unspecified abnormal findings in urine: Secondary | ICD-10-CM | POA: Diagnosis not present

## 2021-08-06 DIAGNOSIS — R600 Localized edema: Secondary | ICD-10-CM | POA: Diagnosis not present

## 2021-08-06 DIAGNOSIS — D472 Monoclonal gammopathy: Secondary | ICD-10-CM | POA: Diagnosis not present

## 2021-08-08 DIAGNOSIS — M1712 Unilateral primary osteoarthritis, left knee: Secondary | ICD-10-CM | POA: Diagnosis not present

## 2021-08-11 DIAGNOSIS — M1712 Unilateral primary osteoarthritis, left knee: Secondary | ICD-10-CM | POA: Insufficient documentation

## 2021-08-20 DIAGNOSIS — G4733 Obstructive sleep apnea (adult) (pediatric): Secondary | ICD-10-CM | POA: Diagnosis not present

## 2021-08-20 DIAGNOSIS — R001 Bradycardia, unspecified: Secondary | ICD-10-CM | POA: Diagnosis not present

## 2021-08-20 DIAGNOSIS — I10 Essential (primary) hypertension: Secondary | ICD-10-CM | POA: Diagnosis not present

## 2021-08-20 DIAGNOSIS — Z794 Long term (current) use of insulin: Secondary | ICD-10-CM | POA: Diagnosis not present

## 2021-08-20 DIAGNOSIS — I2510 Atherosclerotic heart disease of native coronary artery without angina pectoris: Secondary | ICD-10-CM | POA: Diagnosis not present

## 2021-08-20 DIAGNOSIS — Z01810 Encounter for preprocedural cardiovascular examination: Secondary | ICD-10-CM | POA: Diagnosis not present

## 2021-08-20 DIAGNOSIS — E669 Obesity, unspecified: Secondary | ICD-10-CM | POA: Diagnosis not present

## 2021-08-20 DIAGNOSIS — E119 Type 2 diabetes mellitus without complications: Secondary | ICD-10-CM | POA: Diagnosis not present

## 2021-08-20 DIAGNOSIS — I480 Paroxysmal atrial fibrillation: Secondary | ICD-10-CM | POA: Diagnosis not present

## 2021-08-20 DIAGNOSIS — E782 Mixed hyperlipidemia: Secondary | ICD-10-CM | POA: Diagnosis not present

## 2021-08-20 DIAGNOSIS — Z79899 Other long term (current) drug therapy: Secondary | ICD-10-CM | POA: Diagnosis not present

## 2021-08-21 DIAGNOSIS — G4733 Obstructive sleep apnea (adult) (pediatric): Secondary | ICD-10-CM | POA: Diagnosis not present

## 2021-08-21 DIAGNOSIS — I10 Essential (primary) hypertension: Secondary | ICD-10-CM | POA: Diagnosis not present

## 2021-08-27 NOTE — Discharge Instructions (Signed)
Instructions after Total Knee Replacement   Bonnie Holt [name], [name], M.D.     Dept. of Orthopaedics & Sports Medicine  [facility]  [address]  [place], [place]  [address]  Phone: [phone]   Fax: [phone]    DIET: Drink plenty of non-alcoholic fluids. Resume your normal diet. Include foods high in fiber.  ACTIVITY:  You may use crutches or a walker with weight-bearing as tolerated, unless instructed otherwise. You may be weaned off of the walker or crutches by your Physical Therapist.  Do NOT place pillows under the knee. Anything placed under the knee could limit your ability to straighten the knee.   Continue doing gentle exercises. Exercising will reduce the pain and swelling, increase motion, and prevent muscle weakness.   Please continue to use the TED compression stockings for 6 weeks. You may remove the stockings at night, but should reapply them in the morning. Do not drive or operate any equipment until instructed.  WOUND CARE:  Continue to use the PolarCare or ice packs periodically to reduce pain and swelling. You may bathe or shower after the staples are removed at the first office visit following surgery.  MEDICATIONS: You may resume your regular medications. Please take the pain medication as prescribed on the medication. Do not take pain medication on an empty stomach. You have been given a prescription for a blood thinner (Lovenox or Coumadin). Please take the medication as instructed. (NOTE: After completing a 2 week course of Lovenox, take one Enteric-coated aspirin once a day. This along with elevation will help reduce the possibility of phlebitis in your operated leg.) Do not drive or drink alcoholic beverages when taking pain medications.  CALL THE OFFICE FOR: Temperature above 101 degrees Excessive bleeding or drainage on the dressing. Excessive swelling, coldness, or paleness of the toes. Persistent nausea and vomiting.  FOLLOW-UP:  You  should have an appointment to return to the office in 10-14 days after surgery. Arrangements have been made for continuation of Physical Therapy (either home therapy or outpatient therapy).   [facility] Department Directory         [email or web address]       [email or web address]          Cardiology  Appointments: [employer] [employer] - [phone] [place] - [phone]  Endocrinology  Appointments: [place] - [phone] [place] - [phone]  Gastroenterology  Appointments: [place] - [phone] [place] - [phone]        General Surgery   Appointments: [place] - [phone]  Internal Medicine/Family Medicine  Appointments: [place] - [phone] [place] - [phone] [place] - [phone]  Metabolic and Weigh Loss Surgery  Appointments: [place] - [phone]        Neurology  Appointments: [place] - [phone] [place] - [phone]  Neurosurgery  Appointments: [place] - [phone]  Obstetrics & Gynecology  Appointments: [place] - [phone] [place] - [phone]        Pediatrics  Appointments: [place] - [phone] [place] - [phone]  Physiatry  Appointments: [place] -[phone]  Physical Therapy  Appointments: [place] - [phone] [place] - [phone]        Podiatry  Appointments: [place] - [phone] [place] - [phone]  Pulmonology  Appointments: [place] - [phone]  Rheumatology  Appointments: [place] - [phone]        [place] Location: [facility]  [address] , [place]  [address]  [place] Location: [facility] [address] [place], [place]  [address]  [place] Location: [facility] [address] [place], [place]  [address]    

## 2021-09-02 ENCOUNTER — Other Ambulatory Visit: Payer: Self-pay

## 2021-09-02 ENCOUNTER — Encounter
Admission: RE | Admit: 2021-09-02 | Discharge: 2021-09-02 | Disposition: A | Discharge status: Home / Self Care | Payer: PPO | Source: Ambulatory Visit | Attending: Orthopedic Surgery | Admitting: Orthopedic Surgery

## 2021-09-02 VITALS — BP 127/52 | HR 53 | Temp 97.8°F | Resp 18 | Ht 62.0 in | Wt 209.7 lb

## 2021-09-02 DIAGNOSIS — G9340 Encephalopathy, unspecified: Secondary | ICD-10-CM | POA: Diagnosis not present

## 2021-09-02 DIAGNOSIS — N184 Chronic kidney disease, stage 4 (severe): Secondary | ICD-10-CM | POA: Diagnosis not present

## 2021-09-02 DIAGNOSIS — R7989 Other specified abnormal findings of blood chemistry: Secondary | ICD-10-CM | POA: Diagnosis not present

## 2021-09-02 DIAGNOSIS — M1712 Unilateral primary osteoarthritis, left knee: Secondary | ICD-10-CM | POA: Insufficient documentation

## 2021-09-02 DIAGNOSIS — Z01818 Encounter for other preprocedural examination: Secondary | ICD-10-CM

## 2021-09-02 DIAGNOSIS — E1122 Type 2 diabetes mellitus with diabetic chronic kidney disease: Secondary | ICD-10-CM | POA: Diagnosis not present

## 2021-09-02 DIAGNOSIS — Z01812 Encounter for preprocedural laboratory examination: Secondary | ICD-10-CM | POA: Insufficient documentation

## 2021-09-02 DIAGNOSIS — R8290 Unspecified abnormal findings in urine: Secondary | ICD-10-CM | POA: Diagnosis not present

## 2021-09-02 HISTORY — DX: Other specified arthropod-borne viral fevers: A93.8

## 2021-09-02 LAB — COMPREHENSIVE METABOLIC PANEL
ALT: 34 U/L (ref 0–44)
AST: 32 U/L (ref 15–41)
Albumin: 3.7 g/dL (ref 3.5–5.0)
Alkaline Phosphatase: 95 U/L (ref 38–126)
Anion gap: 8 (ref 5–15)
BUN: 30 mg/dL — ABNORMAL HIGH (ref 8–23)
CO2: 25 mmol/L (ref 22–32)
Calcium: 9.6 mg/dL (ref 8.9–10.3)
Chloride: 109 mmol/L (ref 98–111)
Creatinine, Ser: 1.7 mg/dL — ABNORMAL HIGH (ref 0.44–1.00)
GFR, Estimated: 31 mL/min — ABNORMAL LOW (ref >60)
Glucose, Bld: 111 mg/dL — ABNORMAL HIGH (ref 70–99)
Potassium: 3.8 mmol/L (ref 3.5–5.1)
Sodium: 142 mmol/L (ref 135–145)
Total Bilirubin: 1.3 mg/dL — ABNORMAL HIGH (ref 0.3–1.2)
Total Protein: 7.1 g/dL (ref 6.5–8.1)

## 2021-09-02 LAB — HEMOGLOBIN A1C
Hgb A1c MFr Bld: 5.6 % (ref 4.8–5.6)
Mean Plasma Glucose: 114.02 mg/dL

## 2021-09-02 LAB — C-REACTIVE PROTEIN: CRP: 1.3 mg/dL — ABNORMAL HIGH (ref <1.0)

## 2021-09-02 LAB — CBC
HCT: 37.8 % (ref 36.0–46.0)
Hemoglobin: 12.7 g/dL (ref 12.0–15.0)
MCH: 32.1 pg (ref 26.0–34.0)
MCHC: 33.6 g/dL (ref 30.0–36.0)
MCV: 95.5 fL (ref 80.0–100.0)
Platelets: 181 10*3/uL (ref 150–400)
RBC: 3.96 MIL/uL (ref 3.87–5.11)
RDW: 14 % (ref 11.5–15.5)
WBC: 8.9 10*3/uL (ref 4.0–10.5)
nRBC: 0 % (ref 0.0–0.2)

## 2021-09-02 LAB — TYPE AND SCREEN
ABO/RH(D): O POS
Antibody Screen: NEGATIVE

## 2021-09-02 LAB — URINALYSIS, ROUTINE W REFLEX MICROSCOPIC
Bilirubin Urine: NEGATIVE
Glucose, UA: NEGATIVE mg/dL
Hgb urine dipstick: NEGATIVE
Ketones, ur: NEGATIVE mg/dL
Nitrite: NEGATIVE
Protein, ur: NEGATIVE mg/dL
Specific Gravity, Urine: 1.016 (ref 1.005–1.030)
pH: 5 (ref 5.0–8.0)

## 2021-09-02 LAB — SURGICAL PCR SCREEN
MRSA, PCR: NEGATIVE
Staphylococcus aureus: NEGATIVE

## 2021-09-02 LAB — SEDIMENTATION RATE: Sed Rate: 27 mm/hr (ref 0–30)

## 2021-09-02 NOTE — Patient Instructions (Addendum)
Your procedure is scheduled on: Friday 09/12/21 Report to the Registration Desk on the 1st floor of the [facility]. To find out your arrival time, please call [phone] between 1PM - 3PM on: Thursday 09/11/21 If your arrival time is 6:00 am, do not arrive prior to that time as the [facility] entrance doors do not open until 6:00 am.  REMEMBER: Instructions that are not followed completely may result in serious medical risk, up to and including death; or upon the discretion of your surgeon and anesthesiologist your surgery may need to be rescheduled.  Do not eat food after midnight the night before surgery.  No gum chewing, lozengers or hard candies.  You may however, drink WATER up to 2 hours before you are scheduled to arrive for your surgery. Do not drink anything within 2 hours of your scheduled arrival time.  Type 2 diabetics should only drink water.  In addition, your doctor has ordered for you to drink the provided  Gatorade G2 Drinking this carbohydrate drink up to two hours before surgery helps to reduce insulin resistance and improve patient outcomes. Please complete drinking 2 hours prior to scheduled arrival time.  TAKE THESE MEDICATIONS THE MORNING OF SURGERY WITH A SIP OF WATER: amiodarone (PACERONE) 100 MG tablet letrozole (FEMARA) 2.5 MG tablet metoprolol succinate (TOPROL-XL) 50 MG 24 hr tablet  Hold your apixaban (ELIQUIS) 5 MG TABS tablet 3 days prior to surgery. Last dose on Monday 09/08/21.  One week prior to surgery: Stop Anti-inflammatories (NSAIDS) such as Advil, Aleve, Ibuprofen, Motrin, Naproxen, Naprosyn and Aspirin based products such as Excedrin, Goodys Powder, BC Powder.  Stop taking your calcium citrate-vitamin D (CITRACAL+D) 315-200 MG-UNIT tablet, Cholecalciferol (VITAMIN D-3) 5000 units TABS, Coenzyme Q10 (COQ10 PO), multivitamin (RENA-VIT) TABS tablet, and ANY OVER THE COUNTER supplements until after surgery.  You may however, continue to take  Tylenol if needed for pain up until the day of surgery.  No Alcohol for 24 hours before or after surgery.  No Smoking including e-cigarettes for 24 hours prior to surgery.  No chewable tobacco products for at least 6 hours prior to surgery.  No nicotine patches on the day of surgery.  Do not use any "recreational" drugs for at least a week prior to your surgery.  Please be advised that the combination of cocaine and anesthesia may have negative outcomes, up to and including death. If you test positive for cocaine, your surgery will be cancelled.  On the morning of surgery brush your teeth with toothpaste and water, you may rinse your mouth with mouthwash if you wish. Do not swallow any toothpaste or mouthwash.  Use CHG Soap as directed on instruction sheet.  Do not wear jewelry, make-up, hairpins, clips or nail polish.  Do not wear lotions, powders, or perfumes.   Do not shave body from the neck down 48 hours prior to surgery just in case you cut yourself which could leave a site for infection.  Also, freshly shaved skin may become irritated if using the CHG soap.  Do not bring valuables to the hospital. [name] is not responsible for any missing/lost belongings or valuables.   Bring your C-PAP to the hospital with you in case you may have to spend the night.   Notify your doctor if there is any change in your medical condition (cold, fever, infection).  Wear comfortable clothing (specific to your surgery type) to the hospital.  After surgery, you can help prevent lung complications by doing breathing  exercises.  Take deep breaths and cough every 1-2 hours. Your doctor may order a device called an Incentive Spirometer to help you take deep breaths.  If you are being admitted to the hospital overnight, leave your suitcase in the car. After surgery it may be brought to your room.  If you are taking public transportation, you will need to have a responsible adult (18 years or  older) with you. Please confirm with your physician that it is acceptable to use public transportation.   Please call the [place] Dept. at [phone] if you have any questions about these instructions.  Surgery Visitation Policy:  Patients undergoing a surgery or procedure may have two family members or support persons with them as long as the person is not COVID-19 positive or experiencing its symptoms.   Inpatient Visitation:    Visiting hours are 7 a.m. to 8 p.m. Up to four visitors are allowed at one time in a patient room, including children. The visitors may rotate out with other people during the day. One designated support person (adult) may remain overnight.

## 2021-09-03 DIAGNOSIS — Z01812 Encounter for preprocedural laboratory examination: Secondary | ICD-10-CM | POA: Diagnosis not present

## 2021-09-04 DIAGNOSIS — Z6841 Body Mass Index (BMI) 40.0 and over, adult: Secondary | ICD-10-CM | POA: Diagnosis not present

## 2021-09-04 DIAGNOSIS — E1122 Type 2 diabetes mellitus with diabetic chronic kidney disease: Secondary | ICD-10-CM | POA: Diagnosis not present

## 2021-09-04 DIAGNOSIS — N184 Chronic kidney disease, stage 4 (severe): Secondary | ICD-10-CM | POA: Diagnosis not present

## 2021-09-04 DIAGNOSIS — I10 Essential (primary) hypertension: Secondary | ICD-10-CM | POA: Diagnosis not present

## 2021-09-04 DIAGNOSIS — I480 Paroxysmal atrial fibrillation: Secondary | ICD-10-CM | POA: Diagnosis not present

## 2021-09-04 DIAGNOSIS — I25119 Atherosclerotic heart disease of native coronary artery with unspecified angina pectoris: Secondary | ICD-10-CM | POA: Diagnosis not present

## 2021-09-04 DIAGNOSIS — M1712 Unilateral primary osteoarthritis, left knee: Secondary | ICD-10-CM | POA: Diagnosis not present

## 2021-09-04 LAB — URINE CULTURE: Culture: <10000 — AB

## 2021-09-09 ENCOUNTER — Encounter: Payer: Self-pay | Admitting: Orthopedic Surgery

## 2021-09-09 NOTE — Progress Notes (Signed)
Perioperative Services  Pre-Admission/Anesthesia Testing Clinical Review  Date: 09/09/21  Patient Demographics:  Name: Bonnie Holt DOB:   18-Jan-1946 MRN:   [id number]  Planned Surgical Procedure(s):    Case: [id number] Date/Time: 09/12/21 1133   Procedure: COMPUTER ASSISTED TOTAL KNEE ARTHROPLASTY - RNFA (Left: Knee)   Anesthesia type: Choice   Pre-op diagnosis: PRIMARY OSTEOARTHRITIS OF LEFT KNEE.   Location: [facility] OR ROOM 03 / [place] ORS FOR ANESTHESIA GROUP   Surgeons: Bonnie Leep, MD   NOTE: Available PAT nursing documentation and vital signs have been reviewed. Clinical nursing staff has updated patient's PMH/PSHx, current medication list, and drug allergies/intolerances to ensure comprehensive history available to assist in medical decision making as it pertains to the aforementioned surgical procedure and anticipated anesthetic course. Extensive review of available clinical information performed. [name] PMH and PSHx updated with any diagnoses/procedures that  may have been inadvertently omitted during her intake with the pre-admission testing department's nursing staff.  Clinical Discussion:  Bonnie Holt is a 76 y.o. female who is submitted for pre-surgical anesthesia review and clearance prior to her undergoing the above procedure. Patient has never been a smoker. Pertinent PMH includes: CAD, PAF, LBBB, bradycardia, diastolic dysfunction, chronic ischemic heart disease, HTN, HLD, T2DM, CKD-IV, OSAH (requires nocturnal PAP therapy), OA, LEFT breast cancer (s/p XRT).  Patient is followed by cardiology Bonnie Bigness, MD). She was last seen in the cardiology clinic on 06/12/2021; notes reviewed.  At the time of her clinic visit, patient doing well overall from a cardiovascular perspective.  She denied any episodes of chest pain, shortness breath, PND, orthopnea, palpitations, significant peripheral edema, vertiginous symptoms, or presyncope/syncope.  Patient with a past medical  history significant for cardiovascular diagnoses.  Of note, patient has received care in [place].  Records regarding remote cardiovascular care unavailable at time of consult.  Information gathered from patient and notes from local cardiologist.  Patient underwent diagnostic left heart catheterization in 2012 that revealed obstructive CAD.  She subsequently underwent PCI with BMS placement x1 (unknown type/location).  Myocardial perfusion imaging study performed on 07/01/2017 revealed a normal left ventricular systolic function with an EF of 53%.  There were no regional wall motion abnormalities.  There was evidence of a borderline anterior apical septal defect related to known LBBB.  Study interpreted as intermediate risk.  Last TTE was performed on 08/25/2017 revealing a low normal left ventricular systolic function with mild LVH; LVEF 50-55%.  Study was reported to be very limited due to poor sound wave transmission, patient noncompliance, and tachycardia.  Left ventricular function not well seen, however appears preserved.  Valves poorly visualized.  Previous TTE from 06/28/2017 reviewed.  EF preserved at 50%.  There was trivial to mild mitral, tricuspid, and pulmonary valve regurgitation.  There is mild LAE and LVH.  There was no significant transvalvular gradient suggestive of stenosis.  Patient with an atrial fibrillation diagnosis; CHA2DS2-VASc Score = 5 (age x 2, sex, HTN, T2DM). Her rate and rhythm care currently being maintained on oral standard dose amiodarone + metoprolol succinate. She is chronically anticoagulated using standard dose apixaban; reported to be compliant with therapy with no evidence or reports of GI bleeding.  Blood pressure reasonably controlled at 142/78 on currently prescribed CCB, beta-blocker, and diuretic therapies. She is on a statin for her HLD diagnosis and further ASCVD prevention. T2DM well controlled on currently prescribed regimen; last HgbA1c was 6.1% when  checked on 06/03/2021. Patient has an OSAH diagnosis and is reported to  be compliant with her prescribed nocturnal PAP therapy.  Functional capacity limited by arthritides, however patient still felt to be able to achieve at least 4 METS of activity without angina/anginal equivalent symptoms.  No changes were made to her medication regimen.  Patient to follow-up with outpatient cardiology in 6 months or sooner if needed.  Bonnie Holt is scheduled for an elective LEFT COMPUTER ASSISTED TOTAL KNEE ARTHROPLASTY on 09/12/2021 with Dr. Skip Estimable, MD. Given patient's past medical history significant for cardiovascular diagnoses, presurgical cardiac clearance was sought by the  PAT team. Per cardiology, "this patient is optimized for surgery and may proceed with the planned procedural course with a MODERATE risk of significant perioperative cardiovascular complications".  Again, this patient is on daily anticoagulation therapy.  She has been instructed on recommendations from her cardiologist for holding her apixaban for 3 days prior to her procedure with plans to restart as soon as postoperative bleeding risk felt to be minimized by her primary attending surgeon.  The patient is aware that her last dose of apixaban should be on 09/08/2021.  Patient denies previous perioperative complications with anesthesia in the past. In review of the available records, it is noted that patient underwent a general anesthetic course here at [facility] (ASA IV) in 08/2017 without documented complications.      09/02/2021    9:57 AM 06/24/2021   10:10 AM 12/23/2020   10:43 AM  Vitals with BMI  Height '5\' 2"'$  '5\' 2"'$    Weight 209 lbs 11 oz 212 lbs 14 oz 213 lbs 13 oz  BMI 24.82 50.03   Systolic 704 888 916  Diastolic 52 81 45  Pulse 53 57 72   Providers/Specialists:   NOTE: Primary physician provider listed below. Patient may have been seen by APP or partner within same practice.    PROVIDER ROLE / SPECIALTY LAST OV  Holt, Bonnie Record, MD Orthopedics (Surgeon) 09/04/2021  Bonnie Ruths, MD Primary Care Provider 06/05/2021  Bonnie Coho, MD Cardiology 08/20/2021  Bonnie Hoh, MD Medical Oncology 06/24/2021  Noreene Filbert, MD Radiation Oncology 11/07/2020  Murlean Iba, MD Nephrology 09/05/2021   Allergies:  Levofloxacin, Ace inhibitors, and Warfarin  Current Home Medications:   No current facility-administered medications for this encounter.    acetaminophen (TYLENOL) 325 MG tablet   amiodarone (PACERONE) 100 MG tablet   apixaban (ELIQUIS) 5 MG TABS tablet   atorvastatin (LIPITOR) 80 MG tablet   calcium citrate-vitamin D (CITRACAL+D) 315-200 MG-UNIT tablet   Cholecalciferol (VITAMIN D-3) 5000 units TABS   Coenzyme Q10 (COQ10 PO)   Continuous Blood Gluc Sensor (FREESTYLE LIBRE 14 DAY SENSOR) MISC   Flaxseed, Linseed, (FLAXSEED OIL) 1200 MG CAPS   glipiZIDE (GLUCOTROL) 5 MG tablet   insulin detemir (LEVEMIR) 100 UNIT/ML FlexPen   letrozole (FEMARA) 2.5 MG tablet   losartan (COZAAR) 25 MG tablet   metoprolol succinate (TOPROL-XL) 50 MG 24 hr tablet   multivitamin (RENA-VIT) TABS tablet   RYBELSUS 7 MG TABS   torsemide (DEMADEX) 20 MG tablet   History:   Past Medical History:  Diagnosis Date   Arthritis    ASCUS with positive high risk HPV cervical    Basal cell carcinoma of skin    Bradycardia    Breast cancer, left (HCC) 11/19/2016   a.) stage IA (cT1b, cN0, cM0, G2, ER+, PR+, HER2-); OncotypeDx score = 6; s/p lumpectomy 12/16/2016 + adjuvant XRT (completed 03/20/2017) + 5 years endocrine (letrozole) therapy (completes 03/2022)  Chronic ischemic heart disease    CKD (chronic kidney disease), stage IV (HCC)    Coronary artery disease    a.) LHC/PCI in 2012 in [place]; BMS x 1 placed (unknown type/location)   Diastolic dysfunction 09/32/6712   a.) TTE 06/28/2017: EF 50%, mild LVH, mild LAE, triv PR, mild MR/TR, G1DD.   HLD  (hyperlipidemia)    Hypertension    Left bundle branch block (LBBB)    Long term current use of anticoagulant    a.) apixaban   Long term current use of aromatase inhibitor    a.) letrozole; completes 5 years of therapy in 03/2022   OSA treated with BiPAP    Osteopenia    PAF (paroxysmal atrial fibrillation) (HCC)    a.) CHA2DS2-VASc = 5 (age x2, sex, HTN, T2DM); b.) rate/rhythm maintained on oral amiodarone + metoprolol succinate; chronically anticoagulated using standard dose apixaban   Personal history of radiation therapy    a.) for LEFT breast cancer; completed 03/2017   Tick fever    Type 2 diabetes mellitus treated with insulin ([place])    a.) has CGM in place   Past Surgical History:  Procedure Laterality Date   BREAST BIOPSY Left 11/19/2016   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   BREAST BIOPSY Left 11/19/2016   wing clip 2:30 4 cmfn Invasive mammary carcinoma   BREAST BIOPSY Left 11/19/2016   ribbon clip 3:00 6cmfn Invasive mammary carcinoma   BREAST BIOPSY Left 12/03/2016   LN biopsy, negative   BREAST BIOPSY Left 11/17/2017   affirm bx of calcs, x clip -FIBROSIS WITH DYSTROPHIC CALCIFICATIONS   BREAST LUMPECTOMY Left 12/16/2016   excision of all 3 sites, LN negative   CORONARY ANGIOPLASTY WITH STENT PLACEMENT Left 2012   [employer]   DIALYSIS/PERMA CATHETER INSERTION N/A 09/09/2017   Procedure: DIALYSIS/PERMA CATHETER INSERTION;  Surgeon: Algernon Huxley, MD;  Location: [place] [facility];  Service: Cardiovascular;  Laterality: N/A;   DIALYSIS/PERMA CATHETER REMOVAL N/A 10/04/2017   Procedure: DIALYSIS/PERMA CATHETER REMOVAL;  Surgeon: Algernon Huxley, MD;  Location: [place] [facility];  Service: Cardiovascular;  Laterality: N/A;   ESOPHAGOGASTRODUODENOSCOPY (EGD) WITH PROPOFOL N/A 09/07/2017   Procedure: ESOPHAGOGASTRODUODENOSCOPY (EGD) WITH PROPOFOL;  Surgeon: Lucilla Lame, MD;  Location: [facility] ENDOSCOPY;  Service: Endoscopy;  Laterality: N/A;   PARTIAL MASTECTOMY WITH  NEEDLE LOCALIZATION Left 12/16/2016   Procedure: PARTIAL MASTECTOMY WITH NEEDLE LOCALIZATION;  Surgeon: Herbert Pun, MD;  Location: [facility] ORS;  Service: General;  Laterality: Left;   SENTINEL NODE BIOPSY Left 12/16/2016   Procedure: SENTINEL NODE BIOPSY;  Surgeon: Herbert Pun, MD;  Location: [facility] ORS;  Service: General;  Laterality: Left;   Family History  Problem Relation Age of Onset   Brain cancer Father    Diabetes Father    Hypertension Brother    Heart Problems Maternal Aunt    Diabetes Paternal Aunt    Heart Problems Maternal Grandmother    Dementia Paternal Grandmother    Heart Problems Brother    Hypertension Brother    Asthma Maternal Aunt    Arthritis Maternal Aunt    Diabetes Paternal Aunt    Cancer Paternal Uncle    Breast cancer Neg Hx    Social History   Tobacco Use   Smoking status: Never   Smokeless tobacco: Never  Vaping Use   Vaping Use: Never used  Substance Use Topics   Alcohol use: Yes    Comment: socially -  2 per year   Drug  use: No    Pertinent Clinical Results:  LABS: Labs reviewed: Acceptable for surgery.  Component Date Value Ref Range Status   WBC 09/02/2021 8.9  4.0 - 10.5 K/uL Final   RBC 09/02/2021 3.96  3.87 - 5.11 MIL/uL Final   Hemoglobin 09/02/2021 12.7  12.0 - 15.0 g/dL Final   HCT 09/02/2021 37.8  36.0 - 46.0 % Final   MCV 09/02/2021 95.5  80.0 - 100.0 fL Final   MCH 09/02/2021 32.1  26.0 - 34.0 pg Final   MCHC 09/02/2021 33.6  30.0 - 36.0 g/dL Final   RDW 09/02/2021 14.0  11.5 - 15.5 % Final   Platelets 09/02/2021 181  150 - 400 K/uL Final   nRBC 09/02/2021 0.0  0.0 - 0.2 % Final   Performed at [facility], [place], [place] [address]   Sodium 09/02/2021 142  135 - 145 mmol/L Final   Potassium 09/02/2021 3.8  3.5 - 5.1 mmol/L Final   Chloride 09/02/2021 109  98 - 111 mmol/L Final   CO2 09/02/2021 25  22 - 32 mmol/L Final   Glucose, Bld 09/02/2021 111 (H)  70 - 99 mg/dL Final    Glucose reference range applies only to samples taken after fasting for at least 8 hours.   BUN 09/02/2021 30 (H)  8 - 23 mg/dL Final   Creatinine, Ser 09/02/2021 1.70 (H)  0.44 - 1.00 mg/dL Final   Calcium 09/02/2021 9.6  8.9 - 10.3 mg/dL Final   Total Protein 09/02/2021 7.1  6.5 - 8.1 g/dL Final   Albumin 09/02/2021 3.7  3.5 - 5.0 g/dL Final   AST 09/02/2021 32  15 - 41 U/L Final   ALT 09/02/2021 34  0 - 44 U/L Final   Alkaline Phosphatase 09/02/2021 95  38 - 126 U/L Final   Total Bilirubin 09/02/2021 1.3 (H)  0.3 - 1.2 mg/dL Final   GFR, Estimated 09/02/2021 31 (L)  >60 mL/min Final   Comment: (NOTE) Calculated using the CKD-EPI Creatinine Equation (2021)    Anion gap 09/02/2021 8  5 - 15 Final   Performed at [facility], [place], [place] [address]   Color, Urine 09/02/2021 YELLOW (A)  YELLOW Final   APPearance 09/02/2021 CLEAR (A)  CLEAR Final   Specific Gravity, Urine 09/02/2021 1.016  1.005 - 1.030 Final   pH 09/02/2021 5.0  5.0 - 8.0 Final   Glucose, UA 09/02/2021 NEGATIVE  NEGATIVE mg/dL Final   Hgb urine dipstick 09/02/2021 NEGATIVE  NEGATIVE Final   Bilirubin Urine 09/02/2021 NEGATIVE  NEGATIVE Final   Ketones, ur 09/02/2021 NEGATIVE  NEGATIVE mg/dL Final   Protein, ur 09/02/2021 NEGATIVE  NEGATIVE mg/dL Final   Nitrite 09/02/2021 NEGATIVE  NEGATIVE Final   Leukocytes,Ua 09/02/2021 MODERATE (A)  NEGATIVE Final   RBC / HPF 09/02/2021 0-5  0 - 5 RBC/hpf Final   WBC, UA 09/02/2021 6-10  0 - 5 WBC/hpf Final   Bacteria, UA 09/02/2021 RARE (A)  NONE SEEN Final   Squamous Epithelial / LPF 09/02/2021 0-5  0 - 5 Final   Mucus 09/02/2021 PRESENT   Final   Performed at [facility], [address] [place], [place] [address]   CRP 09/02/2021 1.3 (H)  <1.0 mg/dL Final   Performed at [facility], [place] [address], [place], [place] [address]   Sed Rate 09/02/2021 27  0 - 30 mm/hr Final   Performed at [facility], [address] [place], [place] [address]   Hgb A1c MFr Bld 09/02/2021 5.6  4.8 - 5.6 % Final   Comment: (NOTE) Pre diabetes:          5.7%-6.4% Diabetes:              >6.4% Glycemic control for adults with diabetes < 7.0%   Mean Plasma Glucose 09/02/2021 114.02  mg/dL Final   Performed at [facility], [place] [address] [place], [place] [address]   ABO/RH(D) 09/02/2021 O POS   Final   Antibody Screen 09/02/2021 NEG   Final   Sample Expiration 09/02/2021 09/16/2021,2359   Final   Extend sample reason 09/02/2021    Final                   Value:NO TRANSFUSIONS OR PREGNANCY IN THE PAST 3 MONTHS Performed at [facility], [address] [place], [place] [address]    MRSA, PCR 09/02/2021 NEGATIVE  NEGATIVE Final   Staphylococcus aureus 09/02/2021 NEGATIVE  NEGATIVE Final   Comment: (NOTE) The Xpert SA Assay (FDA approved for NASAL specimens in patients 90 years of age and older), is one component of a comprehensive surveillance program. It is not intended to diagnose infection nor to guide or monitor treatment. Performed at [facility], [address] [place],  [address]    ECG: Date: 08/20/2021 Rate: 56 bpm Rhythm:  Sinus bradycardia; nonspecific intraventricular block Axis (leads I and aVF): Left axis deviation Intervals: PR 196 ms. QRS 158 ms. QTc 478 ms. ST segment and T wave changes: No evidence of acute ST segment elevation or depression.  Evidence of an age undetermined lateral infarct present. Comparison: Previous tracing on 09/01/2017 showed atrial fibrillation with RVR at a rate of 180 bpm; LBBB present; age undetermined inferior infarct present. NOTE: Tracing obtained at [facility]; unable for review. Above based on cardiologist's interpretation.    IMAGING / PROCEDURES: DIAGNOSTIC RADIOGRAPHS OF LEFT KNEE 3 VIEWS performed on 07/31/2021 Severe loss of medial compartment joint space with osteophyte formation No fractures or dislocations  No other  osseous abnormalities noted  MYOCARDIAL PERFUSION IMAGING STUDY (LEXISCAN) performed on 07/01/2017 Normal left ventricular systolic function with EF of 53% Normal myocardial thickening and wall motion No artifact Left ventricular cavity size normal SPECT images demonstrate a small perfusion abnormality of mild intensity present in the anterior apical septal region on stress images possibly related to LBBB Borderline myocardial perfusion scan due to abnormal ECG changes at rest  TRANSTHORACIC ECHOCARDIOGRAM performed on 06/28/2017 Low normal left ventricular systolic function with an EF of 95% Mild LVH Diastolic Doppler parameters consistent with abnormal relaxation (G1DD). Mild LAE Mild mitral and tricuspid valve regurgitation Trivial pulmonary valve regurgitation No aortic valve regurgitation Normal gradients; no valvular stenosis No pericardial effusion  Impression and Plan:  NAVYA TIMMONS has been referred for pre-anesthesia review and clearance prior to her undergoing the planned anesthetic and procedural courses. Available labs, pertinent testing, and imaging results were personally reviewed by me. This patient has been appropriately cleared by cardiology with an overall MODERATE risk of significant perioperative cardiovascular complications.  Based on clinical review performed today (09/09/21), barring any significant acute changes in the patient's overall condition, it is anticipated that she will be able to proceed with the planned surgical intervention. Any acute changes in clinical condition may necessitate her procedure being postponed and/or cancelled. Patient will meet with anesthesia team (MD and/or CRNA) on the day of her procedure for preoperative  evaluation/assessment. Questions regarding anesthetic course will be fielded at that time.   Pre-surgical instructions were reviewed with the patient during her PAT appointment and questions were fielded by PAT clinical staff.  Patient was advised that if any questions or concerns arise prior to her procedure then she should return a call to PAT and/or her surgeon's office to discuss.  Honor Loh, MSN, APRN, FNP-C, CEN [facility]  [facility] Nurse Practitioner Phone: [phone] Fax: [phone] 09/09/21 12:12 PM  NOTE: This note has been prepared using Dragon dictation software. Despite my best ability to proofread, there is always the potential that unintentional transcriptional errors may still occur from this process.

## 2021-09-12 ENCOUNTER — Inpatient Hospital Stay
Admission: AD | Admit: 2021-09-12 | Discharge: 2021-09-15 | DRG: 470 | Disposition: A | Discharge status: Home Health | Payer: PPO | Attending: Orthopedic Surgery | Admitting: Orthopedic Surgery

## 2021-09-12 ENCOUNTER — Encounter
Admission: AD | Disposition: A | Discharge status: Home Health | Payer: Self-pay | Source: Home / Self Care | Attending: Orthopedic Surgery

## 2021-09-12 ENCOUNTER — Other Ambulatory Visit: Payer: Self-pay

## 2021-09-12 ENCOUNTER — Observation Stay: Payer: PPO

## 2021-09-12 ENCOUNTER — Encounter: Payer: Self-pay | Admitting: Orthopedic Surgery

## 2021-09-12 ENCOUNTER — Ambulatory Visit: Payer: PPO | Admitting: Urgent Care

## 2021-09-12 ENCOUNTER — Ambulatory Visit
Admission: RE | Admit: 2021-09-12 | Discharge: 2021-09-12 | Disposition: A | Discharge status: Home / Self Care | Payer: Self-pay | Source: Ambulatory Visit | Attending: Orthopedic Surgery | Admitting: Orthopedic Surgery

## 2021-09-12 ENCOUNTER — Other Ambulatory Visit: Payer: Self-pay | Admitting: Orthopedic Surgery

## 2021-09-12 DIAGNOSIS — Z01812 Encounter for preprocedural laboratory examination: Secondary | ICD-10-CM

## 2021-09-12 DIAGNOSIS — I5032 Chronic diastolic (congestive) heart failure: Secondary | ICD-10-CM | POA: Diagnosis present

## 2021-09-12 DIAGNOSIS — I2510 Atherosclerotic heart disease of native coronary artery without angina pectoris: Secondary | ICD-10-CM | POA: Diagnosis present

## 2021-09-12 DIAGNOSIS — Z8249 Family history of ischemic heart disease and other diseases of the circulatory system: Secondary | ICD-10-CM

## 2021-09-12 DIAGNOSIS — I480 Paroxysmal atrial fibrillation: Secondary | ICD-10-CM | POA: Diagnosis present

## 2021-09-12 DIAGNOSIS — N183 Chronic kidney disease, stage 3 unspecified: Secondary | ICD-10-CM | POA: Diagnosis not present

## 2021-09-12 DIAGNOSIS — R8290 Unspecified abnormal findings in urine: Secondary | ICD-10-CM

## 2021-09-12 DIAGNOSIS — Z6841 Body Mass Index (BMI) 40.0 and over, adult: Secondary | ICD-10-CM

## 2021-09-12 DIAGNOSIS — Z96652 Presence of left artificial knee joint: Secondary | ICD-10-CM

## 2021-09-12 DIAGNOSIS — Z853 Personal history of malignant neoplasm of breast: Secondary | ICD-10-CM | POA: Diagnosis not present

## 2021-09-12 DIAGNOSIS — Z01818 Encounter for other preprocedural examination: Secondary | ICD-10-CM

## 2021-09-12 DIAGNOSIS — Z9012 Acquired absence of left breast and nipple: Secondary | ICD-10-CM

## 2021-09-12 DIAGNOSIS — Z79899 Other long term (current) drug therapy: Secondary | ICD-10-CM | POA: Diagnosis not present

## 2021-09-12 DIAGNOSIS — Z7984 Long term (current) use of oral hypoglycemic drugs: Secondary | ICD-10-CM

## 2021-09-12 DIAGNOSIS — Z7901 Long term (current) use of anticoagulants: Secondary | ICD-10-CM

## 2021-09-12 DIAGNOSIS — Z923 Personal history of irradiation: Secondary | ICD-10-CM

## 2021-09-12 DIAGNOSIS — Z955 Presence of coronary angioplasty implant and graft: Secondary | ICD-10-CM

## 2021-09-12 DIAGNOSIS — I130 Hypertensive heart and chronic kidney disease with heart failure and stage 1 through stage 4 chronic kidney disease, or unspecified chronic kidney disease: Secondary | ICD-10-CM | POA: Diagnosis present

## 2021-09-12 DIAGNOSIS — Z833 Family history of diabetes mellitus: Secondary | ICD-10-CM

## 2021-09-12 DIAGNOSIS — M25562 Pain in left knee: Secondary | ICD-10-CM

## 2021-09-12 DIAGNOSIS — K5900 Constipation, unspecified: Secondary | ICD-10-CM | POA: Diagnosis present

## 2021-09-12 DIAGNOSIS — G4733 Obstructive sleep apnea (adult) (pediatric): Secondary | ICD-10-CM | POA: Diagnosis present

## 2021-09-12 DIAGNOSIS — R87610 Atypical squamous cells of undetermined significance on cytologic smear of cervix (ASC-US): Secondary | ICD-10-CM | POA: Insufficient documentation

## 2021-09-12 DIAGNOSIS — Z471 Aftercare following joint replacement surgery: Secondary | ICD-10-CM | POA: Diagnosis not present

## 2021-09-12 DIAGNOSIS — R001 Bradycardia, unspecified: Secondary | ICD-10-CM | POA: Diagnosis not present

## 2021-09-12 DIAGNOSIS — Z808 Family history of malignant neoplasm of other organs or systems: Secondary | ICD-10-CM | POA: Diagnosis not present

## 2021-09-12 DIAGNOSIS — N184 Chronic kidney disease, stage 4 (severe): Secondary | ICD-10-CM | POA: Diagnosis present

## 2021-09-12 DIAGNOSIS — E785 Hyperlipidemia, unspecified: Secondary | ICD-10-CM | POA: Diagnosis not present

## 2021-09-12 DIAGNOSIS — E1122 Type 2 diabetes mellitus with diabetic chronic kidney disease: Secondary | ICD-10-CM | POA: Diagnosis present

## 2021-09-12 DIAGNOSIS — Z170 Estrogen receptor positive status [ER+]: Secondary | ICD-10-CM

## 2021-09-12 DIAGNOSIS — E7800 Pure hypercholesterolemia, unspecified: Secondary | ICD-10-CM | POA: Diagnosis present

## 2021-09-12 DIAGNOSIS — N179 Acute kidney failure, unspecified: Secondary | ICD-10-CM | POA: Diagnosis present

## 2021-09-12 DIAGNOSIS — I10 Essential (primary) hypertension: Secondary | ICD-10-CM | POA: Diagnosis present

## 2021-09-12 DIAGNOSIS — Z794 Long term (current) use of insulin: Secondary | ICD-10-CM

## 2021-09-12 DIAGNOSIS — E669 Obesity, unspecified: Secondary | ICD-10-CM | POA: Diagnosis not present

## 2021-09-12 DIAGNOSIS — Z801 Family history of malignant neoplasm of trachea, bronchus and lung: Secondary | ICD-10-CM

## 2021-09-12 DIAGNOSIS — Z85828 Personal history of other malignant neoplasm of skin: Secondary | ICD-10-CM | POA: Diagnosis not present

## 2021-09-12 DIAGNOSIS — I4891 Unspecified atrial fibrillation: Secondary | ICD-10-CM | POA: Diagnosis not present

## 2021-09-12 DIAGNOSIS — R7989 Other specified abnormal findings of blood chemistry: Secondary | ICD-10-CM

## 2021-09-12 DIAGNOSIS — C50412 Malignant neoplasm of upper-outer quadrant of left female breast: Secondary | ICD-10-CM | POA: Diagnosis present

## 2021-09-12 DIAGNOSIS — Z825 Family history of asthma and other chronic lower respiratory diseases: Secondary | ICD-10-CM

## 2021-09-12 DIAGNOSIS — M1712 Unilateral primary osteoarthritis, left knee: Principal | ICD-10-CM | POA: Diagnosis present

## 2021-09-12 DIAGNOSIS — I442 Atrioventricular block, complete: Secondary | ICD-10-CM | POA: Diagnosis present

## 2021-09-12 DIAGNOSIS — Z79811 Long term (current) use of aromatase inhibitors: Secondary | ICD-10-CM

## 2021-09-12 DIAGNOSIS — G9340 Encephalopathy, unspecified: Secondary | ICD-10-CM

## 2021-09-12 DIAGNOSIS — Z96659 Presence of unspecified artificial knee joint: Secondary | ICD-10-CM

## 2021-09-12 HISTORY — DX: Basal cell carcinoma of skin, unspecified: C44.91

## 2021-09-12 HISTORY — DX: Chronic kidney disease, stage 4 (severe): N18.4

## 2021-09-12 HISTORY — DX: Long term (current) use of aromatase inhibitors: Z79.811

## 2021-09-12 HISTORY — DX: Long term (current) use of anticoagulants: Z79.01

## 2021-09-12 HISTORY — DX: Left bundle-branch block, unspecified: I44.7

## 2021-09-12 HISTORY — DX: Hyperlipidemia, unspecified: E78.5

## 2021-09-12 HISTORY — DX: Bradycardia, unspecified: R00.1

## 2021-09-12 HISTORY — DX: Other specified disorders of bone density and structure, unspecified site: M85.80

## 2021-09-12 HISTORY — DX: Obstructive sleep apnea (adult) (pediatric): G47.33

## 2021-09-12 HISTORY — DX: Paroxysmal atrial fibrillation: I48.0

## 2021-09-12 HISTORY — DX: Type 2 diabetes mellitus without complications: E11.9

## 2021-09-12 HISTORY — PX: KNEE ARTHROPLASTY: SHX992

## 2021-09-12 HISTORY — DX: Chronic ischemic heart disease, unspecified: I25.9

## 2021-09-12 HISTORY — DX: Atypical squamous cells of undetermined significance on cytologic smear of cervix (ASC-US): R87.610

## 2021-09-12 LAB — TROPONIN I (HIGH SENSITIVITY)
Troponin I (High Sensitivity): 35 ng/L — ABNORMAL HIGH (ref <18)
Troponin I (High Sensitivity): 17 ng/L (ref <18)

## 2021-09-12 LAB — GLUCOSE, CAPILLARY
Glucose-Capillary: 126 mg/dL — ABNORMAL HIGH (ref 70–99)
Glucose-Capillary: 191 mg/dL — ABNORMAL HIGH (ref 70–99)
Glucose-Capillary: 226 mg/dL — ABNORMAL HIGH (ref 70–99)
Glucose-Capillary: 180 mg/dL — ABNORMAL HIGH (ref 70–99)

## 2021-09-12 LAB — CBC
HCT: 37.2 % (ref 36.0–46.0)
Hemoglobin: 12 g/dL (ref 12.0–15.0)
MCH: 31.7 pg (ref 26.0–34.0)
MCHC: 32.3 g/dL (ref 30.0–36.0)
MCV: 98.4 fL (ref 80.0–100.0)
Platelets: 165 10*3/uL (ref 150–400)
RBC: 3.78 MIL/uL — ABNORMAL LOW (ref 3.87–5.11)
RDW: 13.7 % (ref 11.5–15.5)
WBC: 6.8 10*3/uL (ref 4.0–10.5)
nRBC: 0 % (ref 0.0–0.2)

## 2021-09-12 LAB — BASIC METABOLIC PANEL
Anion gap: 6 (ref 5–15)
BUN: 35 mg/dL — ABNORMAL HIGH (ref 8–23)
CO2: 21 mmol/L — ABNORMAL LOW (ref 22–32)
Calcium: 8.5 mg/dL — ABNORMAL LOW (ref 8.9–10.3)
Chloride: 109 mmol/L (ref 98–111)
Creatinine, Ser: 2.19 mg/dL — ABNORMAL HIGH (ref 0.44–1.00)
GFR, Estimated: 23 mL/min — ABNORMAL LOW (ref >60)
Glucose, Bld: 234 mg/dL — ABNORMAL HIGH (ref 70–99)
Potassium: 5.2 mmol/L — ABNORMAL HIGH (ref 3.5–5.1)
Sodium: 136 mmol/L (ref 135–145)

## 2021-09-12 SURGERY — ARTHROPLASTY, KNEE, TOTAL, USING IMAGELESS COMPUTER-ASSISTED NAVIGATION
Anesthesia: Spinal | Site: Knee | Laterality: Left

## 2021-09-12 MED ORDER — ALUM & MAG HYDROXIDE-SIMETH 200-200-20 MG/5ML PO SUSP: 30.0000 mL | ORAL | Status: DC | PRN

## 2021-09-12 MED ORDER — DIPHENHYDRAMINE HCL 12.5 MG/5ML PO ELIX: 12.5000 mg | ORAL_SOLUTION | ORAL | Status: DC | PRN

## 2021-09-12 MED ORDER — MAGNESIUM HYDROXIDE 400 MG/5ML PO SUSP
30.0000 mL | Freq: Every day | ORAL | Status: DC
  Administered 2021-09-12 – 2021-09-13 (×2): 30 mL via ORAL
  Filled 2021-09-12 (×2): qty 30

## 2021-09-12 MED ORDER — MIDAZOLAM HCL 2 MG/2ML IJ SOLN
INTRAMUSCULAR | Status: DC | PRN
  Administered 2021-09-12 (×2): 1 mg via INTRAVENOUS

## 2021-09-12 MED ORDER — INSULIN DETEMIR 100 UNIT/ML FLEXPEN: 4.0000 [IU] | PEN_INJECTOR | Freq: Every day | SUBCUTANEOUS | Status: DC

## 2021-09-12 MED ORDER — CHLORHEXIDINE GLUCONATE 0.12 % MT SOLN: 15.0000 mL | Freq: Once | OROMUCOSAL | Status: AC

## 2021-09-12 MED ORDER — PROPOFOL 1000 MG/100ML IV EMUL
INTRAVENOUS | Status: AC
  Filled 2021-09-12: qty 100

## 2021-09-12 MED ORDER — GABAPENTIN 300 MG PO CAPS
ORAL_CAPSULE | ORAL | Status: AC
  Administered 2021-09-12: 300 mg via ORAL
  Filled 2021-09-12: qty 1

## 2021-09-12 MED ORDER — TRAMADOL HCL 50 MG PO TABS: 50.0000 mg | ORAL_TABLET | ORAL | Status: DC | PRN

## 2021-09-12 MED ORDER — TRANEXAMIC ACID-NACL 1000-0.7 MG/100ML-% IV SOLN
INTRAVENOUS | Status: AC
  Administered 2021-09-12: 1000 mg via INTRAVENOUS
  Filled 2021-09-12: qty 100

## 2021-09-12 MED ORDER — TRANEXAMIC ACID-NACL 1000-0.7 MG/100ML-% IV SOLN
INTRAVENOUS | Status: AC
  Filled 2021-09-12: qty 100

## 2021-09-12 MED ORDER — FAMOTIDINE 20 MG PO TABS
ORAL_TABLET | ORAL | Status: AC
  Administered 2021-09-12: 20 mg via ORAL
  Filled 2021-09-12: qty 1

## 2021-09-12 MED ORDER — BUPIVACAINE HCL (PF) 0.5 % IJ SOLN
INTRAMUSCULAR | Status: DC | PRN
  Administered 2021-09-12: 2.5 mL

## 2021-09-12 MED ORDER — ACETAMINOPHEN 10 MG/ML IV SOLN
1000.0000 mg | Freq: Four times a day (QID) | INTRAVENOUS | Status: AC
  Administered 2021-09-12 – 2021-09-13 (×4): 1000 mg via INTRAVENOUS
  Filled 2021-09-12 (×4): qty 100

## 2021-09-12 MED ORDER — PHENYLEPHRINE HCL-NACL 20-0.9 MG/250ML-% IV SOLN
INTRAVENOUS | Status: DC | PRN
  Administered 2021-09-12: 20 ug/min via INTRAVENOUS

## 2021-09-12 MED ORDER — ACETAMINOPHEN 10 MG/ML IV SOLN
INTRAVENOUS | Status: DC | PRN
  Administered 2021-09-12: 1000 mg via INTRAVENOUS

## 2021-09-12 MED ORDER — INSULIN ASPART 100 UNIT/ML IJ SOLN: 0.0000 [IU] | Freq: Every day | INTRAMUSCULAR | Status: DC

## 2021-09-12 MED ORDER — HYDROMORPHONE HCL 1 MG/ML IJ SOLN: 0.5000 mg | INTRAMUSCULAR | Status: DC | PRN

## 2021-09-12 MED ORDER — GABAPENTIN 300 MG PO CAPS: 300.0000 mg | ORAL_CAPSULE | Freq: Once | ORAL | Status: AC

## 2021-09-12 MED ORDER — SENNOSIDES-DOCUSATE SODIUM 8.6-50 MG PO TABS
1.0000 | ORAL_TABLET | Freq: Two times a day (BID) | ORAL | Status: DC
Start: 2021-09-12 — End: 2021-09-15
  Administered 2021-09-13 – 2021-09-15 (×5): 1 via ORAL
  Filled 2021-09-12 (×4): qty 1

## 2021-09-12 MED ORDER — FENTANYL CITRATE (PF) 100 MCG/2ML IJ SOLN
INTRAMUSCULAR | Status: AC
  Filled 2021-09-12: qty 2

## 2021-09-12 MED ORDER — RENA-VITE PO TABS
1.0000 | ORAL_TABLET | Freq: Every day | ORAL | Status: DC
Start: 2021-09-12 — End: 2021-09-15
  Administered 2021-09-13 – 2021-09-14 (×2): 1 via ORAL
  Filled 2021-09-12 (×2): qty 1

## 2021-09-12 MED ORDER — SODIUM CHLORIDE 0.9 % IV SOLN: INTRAVENOUS | Status: DC

## 2021-09-12 MED ORDER — APIXABAN 5 MG PO TABS
5.0000 mg | ORAL_TABLET | Freq: Two times a day (BID) | ORAL | Status: DC
  Administered 2021-09-13 – 2021-09-15 (×5): 5 mg via ORAL
  Filled 2021-09-12 (×5): qty 1

## 2021-09-12 MED ORDER — CALCIUM CITRATE-VITAMIN D 315-200 MG-UNIT PO TABS
1.0000 | ORAL_TABLET | Freq: Every day | ORAL | Status: DC
Start: 2021-09-12 — End: 2021-09-12

## 2021-09-12 MED ORDER — DEXAMETHASONE SODIUM PHOSPHATE 10 MG/ML IJ SOLN: 8.0000 mg | Freq: Once | INTRAMUSCULAR | Status: AC

## 2021-09-12 MED ORDER — DEXAMETHASONE SODIUM PHOSPHATE 10 MG/ML IJ SOLN
INTRAMUSCULAR | Status: AC
  Administered 2021-09-12: 8 mg via INTRAVENOUS
  Filled 2021-09-12: qty 1

## 2021-09-12 MED ORDER — SODIUM ZIRCONIUM CYCLOSILICATE 5 G PO PACK
10.0000 g | PACK | Freq: Once | ORAL | Status: AC
Start: 2021-09-12 — End: 2021-09-12
  Administered 2021-09-12: 10 g via ORAL
  Filled 2021-09-12: qty 2

## 2021-09-12 MED ORDER — OXYCODONE HCL 5 MG PO TABS: 5.0000 mg | ORAL_TABLET | ORAL | Status: DC | PRN

## 2021-09-12 MED ORDER — SODIUM CHLORIDE 0.9 % IR SOLN
Status: DC | PRN
  Administered 2021-09-12: 3000 mL

## 2021-09-12 MED ORDER — FENTANYL CITRATE (PF) 100 MCG/2ML IJ SOLN
INTRAMUSCULAR | Status: DC | PRN
  Administered 2021-09-12: 50 ug via INTRAVENOUS

## 2021-09-12 MED ORDER — EPINEPHRINE PF 1 MG/ML IJ SOLN
INTRAMUSCULAR | Status: AC
  Filled 2021-09-12: qty 1

## 2021-09-12 MED ORDER — OXYCODONE HCL 5 MG PO TABS
10.0000 mg | ORAL_TABLET | ORAL | Status: DC | PRN
  Administered 2021-09-15: 10 mg via ORAL
  Filled 2021-09-12: qty 2

## 2021-09-12 MED ORDER — CELECOXIB 200 MG PO CAPS: 400.0000 mg | ORAL_CAPSULE | Freq: Once | ORAL | Status: AC

## 2021-09-12 MED ORDER — ATORVASTATIN CALCIUM 80 MG PO TABS
80.0000 mg | ORAL_TABLET | Freq: Every day | ORAL | Status: DC
  Administered 2021-09-13 – 2021-09-15 (×3): 80 mg via ORAL
  Filled 2021-09-12 (×3): qty 1

## 2021-09-12 MED ORDER — CEFAZOLIN SODIUM-DEXTROSE 2-4 GM/100ML-% IV SOLN
INTRAVENOUS | Status: AC
  Filled 2021-09-12: qty 100

## 2021-09-12 MED ORDER — CALCIUM CITRATE 950 (200 CA) MG PO TABS
200.0000 mg | ORAL_TABLET | Freq: Every day | ORAL | Status: DC
  Administered 2021-09-13 – 2021-09-15 (×3): 200 mg via ORAL
  Filled 2021-09-12 (×3): qty 1

## 2021-09-12 MED ORDER — ACETAMINOPHEN 325 MG PO TABS: 650.0000 mg | ORAL_TABLET | Freq: Four times a day (QID) | ORAL | Status: DC | PRN

## 2021-09-12 MED ORDER — VITAMIN D3 25 MCG (1000 UNIT) PO TABS
5000.0000 [IU] | ORAL_TABLET | Freq: Every day | ORAL | Status: DC
  Administered 2021-09-13 – 2021-09-15 (×3): 5000 [IU] via ORAL
  Filled 2021-09-12 (×6): qty 5

## 2021-09-12 MED ORDER — ONDANSETRON HCL 4 MG/2ML IJ SOLN
INTRAMUSCULAR | Status: AC
  Filled 2021-09-12: qty 2

## 2021-09-12 MED ORDER — TORSEMIDE 20 MG PO TABS: 20.0000 mg | ORAL_TABLET | Freq: Every day | ORAL | Status: DC | PRN

## 2021-09-12 MED ORDER — FENTANYL CITRATE (PF) 100 MCG/2ML IJ SOLN: 25.0000 ug | INTRAMUSCULAR | Status: DC | PRN

## 2021-09-12 MED ORDER — PROPOFOL 10 MG/ML IV BOLUS
INTRAVENOUS | Status: DC | PRN
  Administered 2021-09-12: 30 mg via INTRAVENOUS

## 2021-09-12 MED ORDER — TRANEXAMIC ACID-NACL 1000-0.7 MG/100ML-% IV SOLN
1000.0000 mg | INTRAVENOUS | Status: AC
  Administered 2021-09-12: 1000 mg via INTRAVENOUS

## 2021-09-12 MED ORDER — INSULIN DETEMIR 100 UNIT/ML ~~LOC~~ SOLN
4.0000 [IU] | Freq: Every day | SUBCUTANEOUS | Status: DC
  Administered 2021-09-13 – 2021-09-15 (×3): 4 [IU] via SUBCUTANEOUS
  Filled 2021-09-12 (×3): qty 0.04

## 2021-09-12 MED ORDER — PROPOFOL 10 MG/ML IV BOLUS
INTRAVENOUS | Status: AC
  Filled 2021-09-12: qty 20

## 2021-09-12 MED ORDER — 0.9 % SODIUM CHLORIDE (POUR BTL) OPTIME
TOPICAL | Status: DC | PRN
  Administered 2021-09-12: 500 mL

## 2021-09-12 MED ORDER — ATROPINE SULFATE 1 MG/10ML IJ SOSY
1.0000 mg | PREFILLED_SYRINGE | INTRAMUSCULAR | Status: DC | PRN
  Filled 2021-09-12: qty 10

## 2021-09-12 MED ORDER — ONDANSETRON HCL 4 MG/2ML IJ SOLN
INTRAMUSCULAR | Status: DC | PRN
  Administered 2021-09-12: 4 mg via INTRAVENOUS

## 2021-09-12 MED ORDER — INSULIN ASPART 100 UNIT/ML IJ SOLN
0.0000 [IU] | Freq: Three times a day (TID) | INTRAMUSCULAR | Status: DC
  Administered 2021-09-13: 3 [IU] via SUBCUTANEOUS
  Administered 2021-09-13 – 2021-09-14 (×3): 2 [IU] via SUBCUTANEOUS
  Administered 2021-09-14: 5 [IU] via SUBCUTANEOUS
  Administered 2021-09-14: 2 [IU] via SUBCUTANEOUS
  Administered 2021-09-15: 1 [IU] via SUBCUTANEOUS
  Filled 2021-09-12 (×7): qty 1

## 2021-09-12 MED ORDER — ACETAMINOPHEN 10 MG/ML IV SOLN
INTRAVENOUS | Status: AC
  Filled 2021-09-12: qty 100

## 2021-09-12 MED ORDER — FLEET ENEMA 7-19 GM/118ML RE ENEM: 1.0000 | ENEMA | Freq: Once | RECTAL | Status: DC | PRN

## 2021-09-12 MED ORDER — HYDRALAZINE HCL 20 MG/ML IJ SOLN: 5.0000 mg | INTRAMUSCULAR | Status: DC | PRN

## 2021-09-12 MED ORDER — INSULIN ASPART 100 UNIT/ML IJ SOLN
0.0000 [IU] | Freq: Every day | INTRAMUSCULAR | Status: DC
  Filled 2021-09-12: qty 1

## 2021-09-12 MED ORDER — LETROZOLE 2.5 MG PO TABS
2.5000 mg | ORAL_TABLET | Freq: Every day | ORAL | Status: DC
  Administered 2021-09-13 – 2021-09-15 (×3): 2.5 mg via ORAL
  Filled 2021-09-12 (×3): qty 1

## 2021-09-12 MED ORDER — CHLORHEXIDINE GLUCONATE 0.12 % MT SOLN
OROMUCOSAL | Status: AC
  Administered 2021-09-12: 15 mL via OROMUCOSAL
  Filled 2021-09-12: qty 15

## 2021-09-12 MED ORDER — PHENOL 1.4 % MT LIQD: 1.0000 | OROMUCOSAL | Status: DC | PRN

## 2021-09-12 MED ORDER — ORAL CARE MOUTH RINSE: 15.0000 mL | Freq: Once | OROMUCOSAL | Status: AC

## 2021-09-12 MED ORDER — MIDAZOLAM HCL 2 MG/2ML IJ SOLN
INTRAMUSCULAR | Status: AC
  Filled 2021-09-12: qty 2

## 2021-09-12 MED ORDER — VITAMIN D 25 MCG (1000 UNIT) PO TABS
500.0000 [IU] | ORAL_TABLET | Freq: Every day | ORAL | Status: DC
Start: 2021-09-13 — End: 2021-09-15
  Administered 2021-09-13 – 2021-09-15 (×3): 500 [IU] via ORAL
  Filled 2021-09-12 (×3): qty 1

## 2021-09-12 MED ORDER — EPINEPHRINE 1 MG/10ML IJ SOSY
PREFILLED_SYRINGE | INTRAMUSCULAR | Status: AC
  Filled 2021-09-12: qty 10

## 2021-09-12 MED ORDER — CELECOXIB 200 MG PO CAPS
ORAL_CAPSULE | ORAL | Status: AC
  Administered 2021-09-12: 400 mg via ORAL
  Filled 2021-09-12: qty 2

## 2021-09-12 MED ORDER — TRANEXAMIC ACID-NACL 1000-0.7 MG/100ML-% IV SOLN: 1000.0000 mg | Freq: Once | INTRAVENOUS | Status: AC

## 2021-09-12 MED ORDER — CHLORHEXIDINE GLUCONATE CLOTH 2 % EX PADS
6.0000 | MEDICATED_PAD | Freq: Every day | CUTANEOUS | Status: DC
  Administered 2021-09-13 – 2021-09-14 (×2): 6 via TOPICAL

## 2021-09-12 MED ORDER — SENNOSIDES-DOCUSATE SODIUM 8.6-50 MG PO TABS
1.0000 | ORAL_TABLET | Freq: Two times a day (BID) | ORAL | Status: DC
  Administered 2021-09-12: 1 via ORAL
  Filled 2021-09-12 (×2): qty 1

## 2021-09-12 MED ORDER — GLYCOPYRROLATE 0.2 MG/ML IJ SOLN
INTRAMUSCULAR | Status: DC | PRN
  Administered 2021-09-12: .2 mg via INTRAVENOUS

## 2021-09-12 MED ORDER — BISACODYL 10 MG RE SUPP
10.0000 mg | Freq: Every day | RECTAL | Status: DC | PRN
  Administered 2021-09-15: 10 mg via RECTAL
  Filled 2021-09-12: qty 1

## 2021-09-12 MED ORDER — INSULIN ASPART 100 UNIT/ML IJ SOLN: 0.0000 [IU] | Freq: Three times a day (TID) | INTRAMUSCULAR | Status: DC

## 2021-09-12 MED ORDER — FAMOTIDINE 20 MG PO TABS: 20.0000 mg | ORAL_TABLET | Freq: Once | ORAL | Status: AC

## 2021-09-12 MED ORDER — ACETAMINOPHEN 325 MG PO TABS: 325.0000 mg | ORAL_TABLET | Freq: Four times a day (QID) | ORAL | Status: DC | PRN

## 2021-09-12 MED ORDER — PROPOFOL 500 MG/50ML IV EMUL
INTRAVENOUS | Status: DC | PRN
  Administered 2021-09-12: 100 ug/kg/min via INTRAVENOUS

## 2021-09-12 MED ORDER — CEFAZOLIN SODIUM-DEXTROSE 2-4 GM/100ML-% IV SOLN
2.0000 g | Freq: Four times a day (QID) | INTRAVENOUS | Status: AC
  Administered 2021-09-12 – 2021-09-13 (×2): 2 g via INTRAVENOUS
  Filled 2021-09-12 (×2): qty 100

## 2021-09-12 MED ORDER — EPINEPHRINE 1 MG/10ML IJ SOSY
PREFILLED_SYRINGE | INTRAMUSCULAR | Status: DC | PRN
  Administered 2021-09-12: 20 ug via INTRAVENOUS
  Administered 2021-09-12 (×2): 5 ug via INTRAVENOUS
  Administered 2021-09-12: 10 ug via INTRAVENOUS

## 2021-09-12 MED ORDER — SURGIPHOR WOUND IRRIGATION SYSTEM - OPTIME
TOPICAL | Status: DC | PRN
  Administered 2021-09-12: 1

## 2021-09-12 MED ORDER — CEFAZOLIN SODIUM-DEXTROSE 2-4 GM/100ML-% IV SOLN
2.0000 g | INTRAVENOUS | Status: AC
  Administered 2021-09-12: 2 g via INTRAVENOUS

## 2021-09-12 MED ORDER — CHLORHEXIDINE GLUCONATE 4 % EX LIQD: 60.0000 mL | Freq: Once | CUTANEOUS | Status: DC

## 2021-09-12 MED ORDER — FERROUS SULFATE 325 (65 FE) MG PO TABS
325.0000 mg | ORAL_TABLET | Freq: Two times a day (BID) | ORAL | Status: DC
  Administered 2021-09-13 – 2021-09-15 (×5): 325 mg via ORAL
  Filled 2021-09-12 (×5): qty 1

## 2021-09-12 MED ORDER — METOCLOPRAMIDE HCL 10 MG PO TABS
10.0000 mg | ORAL_TABLET | Freq: Three times a day (TID) | ORAL | Status: AC
  Administered 2021-09-12 – 2021-09-14 (×8): 10 mg via ORAL
  Filled 2021-09-12 (×9): qty 1

## 2021-09-12 MED ORDER — ATROPINE SULFATE 0.4 MG/ML IV SOLN
INTRAVENOUS | Status: DC | PRN
  Administered 2021-09-12: .2 mg via INTRAVENOUS
  Administered 2021-09-12: .4 mg via INTRAVENOUS

## 2021-09-12 MED ORDER — SODIUM CHLORIDE (PF) 0.9 % IJ SOLN
INTRAMUSCULAR | Status: DC | PRN
  Administered 2021-09-12: 120 mL

## 2021-09-12 MED ORDER — ONDANSETRON HCL 4 MG/2ML IJ SOLN: 4.0000 mg | Freq: Once | INTRAMUSCULAR | Status: DC | PRN

## 2021-09-12 MED ORDER — PANTOPRAZOLE SODIUM 40 MG PO TBEC
40.0000 mg | DELAYED_RELEASE_TABLET | Freq: Two times a day (BID) | ORAL | Status: DC
  Administered 2021-09-12 – 2021-09-15 (×6): 40 mg via ORAL
  Filled 2021-09-12 (×6): qty 1

## 2021-09-12 MED ORDER — MENTHOL 3 MG MT LOZG: 1.0000 | LOZENGE | OROMUCOSAL | Status: DC | PRN

## 2021-09-12 SURGICAL SUPPLY — 76 items
ATTUNE PS FEM LT SZ 5 CEM KNEE (Femur) ×1 IMPLANT
ATTUNE PSRP INSR SZ5 6 KNEE (Insert) ×1 IMPLANT
BASE TIBIAL ROT PLAT SZ 5 KNEE (Knees) IMPLANT
BATTERY INSTRU NAVIGATION (MISCELLANEOUS) ×8 IMPLANT
BLADE SAW 70X12.5 (BLADE) ×2 IMPLANT
BLADE SAW 90X13X1.19 OSCILLAT (BLADE) ×2 IMPLANT
BLADE SAW 90X25X1.19 OSCILLAT (BLADE) ×2 IMPLANT
BONE CEMENT GENTAMICIN (Cement) ×4 IMPLANT
CANISTER PREVENA PLUS 150 (CANNISTER) ×1 IMPLANT
CEMENT BONE GENTAMICIN 40 (Cement) IMPLANT
COOLER POLAR GLACIER W/PUMP (MISCELLANEOUS) ×2 IMPLANT
CUFF TOURN SGL QUICK 24 (TOURNIQUET CUFF)
CUFF TOURN SGL QUICK 34 (TOURNIQUET CUFF)
CUFF TRNQT CYL 24X4X16.5-23 (TOURNIQUET CUFF) IMPLANT
CUFF TRNQT CYL 34X4.125X (TOURNIQUET CUFF) IMPLANT
DRAPE 3/4 80X56 (DRAPES) ×2 IMPLANT
DRAPE INCISE IOBAN 66X45 STRL (DRAPES) IMPLANT
DRESSING PEEL AND PLAC PRVNA20 (GAUZE/BANDAGES/DRESSINGS) IMPLANT
DRSG DERMACEA NONADH 3X8 (GAUZE/BANDAGES/DRESSINGS) ×2 IMPLANT
DRSG MEPILEX SACRM 8.7X9.8 (GAUZE/BANDAGES/DRESSINGS) ×2 IMPLANT
DRSG OPSITE POSTOP 4X14 (GAUZE/BANDAGES/DRESSINGS) ×2 IMPLANT
DRSG PEEL AND PLACE PREVENA 20 (GAUZE/BANDAGES/DRESSINGS) ×2
DRSG TEGADERM 4X4.75 (GAUZE/BANDAGES/DRESSINGS) ×2 IMPLANT
DURAPREP 26ML APPLICATOR (WOUND CARE) ×4 IMPLANT
ELECT CAUTERY BLADE 6.4 (BLADE) ×2 IMPLANT
ELECT REM PT RETURN 9FT ADLT (ELECTROSURGICAL) ×2
ELECTRODE REM PT RTRN 9FT ADLT (ELECTROSURGICAL) ×1 IMPLANT
EX-PIN ORTHOLOCK NAV 4X150 (PIN) ×4 IMPLANT
GLOVE BIOGEL M STRL SZ7.5 (GLOVE) ×8 IMPLANT
GLOVE BIOGEL PI IND STRL 8 (GLOVE) ×1 IMPLANT
GLOVE BIOGEL PI INDICATOR 8 (GLOVE) ×1
GLOVE SURG UNDER POLY LF SZ7.5 (GLOVE) ×2 IMPLANT
GOWN STRL REUS W/ TWL LRG LVL3 (GOWN DISPOSABLE) ×2 IMPLANT
GOWN STRL REUS W/ TWL XL LVL3 (GOWN DISPOSABLE) ×1 IMPLANT
GOWN STRL REUS W/TWL LRG LVL3 (GOWN DISPOSABLE) ×2
GOWN STRL REUS W/TWL XL LVL3 (GOWN DISPOSABLE) ×1
HEMOVAC 400CC 10FR (MISCELLANEOUS) ×2 IMPLANT
HOLDER FOLEY CATH W/STRAP (MISCELLANEOUS) ×2 IMPLANT
HOLSTER ELECTROSUGICAL PENCIL (MISCELLANEOUS) ×2 IMPLANT
HOOD PEEL AWAY FLYTE STAYCOOL (MISCELLANEOUS) ×4 IMPLANT
IV NS IRRIG 3000ML ARTHROMATIC (IV SOLUTION) ×2 IMPLANT
KIT DRSG PREVENA PLUS 7DAY 125 (MISCELLANEOUS) ×1 IMPLANT
KIT TURNOVER KIT A (KITS) ×2 IMPLANT
KNIFE SCULPS 14X20 (INSTRUMENTS) ×2 IMPLANT
MANIFOLD NEPTUNE II (INSTRUMENTS) ×4 IMPLANT
NDL SPNL 20GX3.5 QUINCKE YW (NEEDLE) ×2 IMPLANT
NEEDLE SPNL 20GX3.5 QUINCKE YW (NEEDLE) ×4 IMPLANT
NS IRRIG 500ML POUR BTL (IV SOLUTION) ×2 IMPLANT
PACK TOTAL KNEE (MISCELLANEOUS) ×2 IMPLANT
PAD ABD DERMACEA PRESS 5X9 (GAUZE/BANDAGES/DRESSINGS) ×4 IMPLANT
PAD WRAPON POLAR KNEE (MISCELLANEOUS) ×1 IMPLANT
PATELLA MEDIAL ATTUN 35MM KNEE (Knees) ×1 IMPLANT
PIN DRILL FIX HALF THREAD (BIT) ×4 IMPLANT
PIN FIXATION 1/8DIA X 3INL (PIN) ×2 IMPLANT
PULSAVAC PLUS IRRIG FAN TIP (DISPOSABLE) ×2
SOL PREP PVP 2OZ (MISCELLANEOUS) ×2
SOLUTION IRRIG SURGIPHOR (IV SOLUTION) ×2 IMPLANT
SOLUTION PREP PVP 2OZ (MISCELLANEOUS) ×1 IMPLANT
SPONGE DRAIN TRACH 4X4 STRL 2S (GAUZE/BANDAGES/DRESSINGS) ×2 IMPLANT
STAPLER SKIN PROX 35W (STAPLE) ×2 IMPLANT
STOCKINETTE IMPERV 14X48 (MISCELLANEOUS) ×1 IMPLANT
STRAP TIBIA SHORT (MISCELLANEOUS) ×2 IMPLANT
SUCTION FRAZIER HANDLE 10FR (MISCELLANEOUS) ×1
SUCTION TUBE FRAZIER 10FR DISP (MISCELLANEOUS) ×1 IMPLANT
SUT VIC AB 0 CT1 36 (SUTURE) ×4 IMPLANT
SUT VIC AB 1 CT1 36 (SUTURE) ×4 IMPLANT
SUT VIC AB 2-0 CT2 27 (SUTURE) ×2 IMPLANT
SYR 30ML LL (SYRINGE) ×4 IMPLANT
TIBIAL BASE ROT PLAT SZ 5 KNEE (Knees) ×2 IMPLANT
TIP FAN IRRIG PULSAVAC PLUS (DISPOSABLE) ×1 IMPLANT
TOWEL OR 17X26 4PK STRL BLUE (TOWEL DISPOSABLE) ×1 IMPLANT
TOWER CARTRIDGE SMART MIX (DISPOSABLE) ×2 IMPLANT
TRAY FOLEY MTR SLVR 16FR STAT (SET/KITS/TRAYS/PACK) ×2 IMPLANT
WATER STERILE IRR 1000ML POUR (IV SOLUTION) ×1 IMPLANT
WATER STERILE IRR 500ML POUR (IV SOLUTION) ×1 IMPLANT
WRAPON POLAR PAD KNEE (MISCELLANEOUS) ×2

## 2021-09-12 NOTE — Progress Notes (Signed)
Patient arrived to room ICU 6 via bed accompanied by PACU staff at approximately 1845. Pt alert and oriented. Denies pain, on room air. NS via right hand infusing, placed on pump and rate adjusted to 100 cc per hour. Telemetry shows HR 38-40's, with stable BP. Appears to be junctional rhythm, as no P waves observed. Pt denies CP, dizziness or SOB. Husband to bedside. Pt given ice water, tolerated well. Pharmacy tech in to talk with patient. TED hose in place to LE bilaterally and ice pack in place to left LE. Per PACU report, pt ate salad for dinner. Report given to oncoming RN, Harrel Lemon.

## 2021-09-12 NOTE — Assessment & Plan Note (Addendum)
Bradycardia due to third-degree AV block: Currently heart rate 39.  Hemodynamically stable -Dr. Charlynn Grimes consulted Dr. Saralyn Pilar of cardiology -Hold amiodarone, metoprolol -As needed atropine for symptomatic bradycardia -Pacer to the bedside -stat BMP and CBC

## 2021-09-12 NOTE — Assessment & Plan Note (Signed)
-   Hold amiodarone and metoprolol -Eliquis restarted

## 2021-09-12 NOTE — Assessment & Plan Note (Signed)
-   BiPAP is ordered

## 2021-09-12 NOTE — H&P (Signed)
The patient has been re-examined, and the chart reviewed, and there have been no interval changes to the documented history and physical.    The risks, benefits, and alternatives have been discussed at length. The patient expressed understanding of the risks benefits and agreed with plans for surgical intervention.  Junious Ragone P. Dayzha Pogosyan, [name] M.D.    

## 2021-09-12 NOTE — Op Note (Signed)
OPERATIVE NOTE  DATE OF SURGERY:  09/12/2021  PATIENT NAME:  Bonnie Holt   DOB: May 07, 1945  MRN: [id number]  PRE-OPERATIVE DIAGNOSIS: Degenerative arthrosis of the left knee, primary  POST-OPERATIVE DIAGNOSIS:  Same  PROCEDURE:  Left total knee arthroplasty using computer-assisted navigation  SURGEON:  Marciano Sequin. M.D.  ANESTHESIA: spinal  ESTIMATED BLOOD LOSS: 50 mL  FLUIDS REPLACED: 1300 mL of crystalloid  TOURNIQUET TIME: 85 minutes  DRAINS: 2 medium Hemovac drains  SOFT TISSUE RELEASES: Anterior cruciate ligament, posterior cruciate ligament, deep medial collateral ligament, patellofemoral ligament  IMPLANTS UTILIZED: DePuy Attune size 5 posterior stabilized femoral component (cemented), size 5 rotating platform tibial component (cemented), 35 mm medialized dome patella (cemented), and a 6 mm stabilized rotating platform polyethylene insert.  INDICATIONS FOR SURGERY: Bonnie Holt is a 76 y.o. year old female with a long history of progressive knee pain. X-rays demonstrated severe degenerative changes in tricompartmental fashion. The patient had not seen any significant improvement despite conservative nonsurgical intervention. After discussion of the risks and benefits of surgical intervention, the patient expressed understanding of the risks benefits and agree with plans for total knee arthroplasty.   The risks, benefits, and alternatives were discussed at length including but not limited to the risks of infection, bleeding, nerve injury, stiffness, blood clots, the need for revision surgery, cardiopulmonary complications, among others, and they were willing to proceed.  PROCEDURE IN DETAIL: The patient was brought into the operating room and, after adequate spinal anesthesia was achieved, a tourniquet was placed on the patient's upper thigh. The patient's knee and leg were cleaned and prepped with alcohol and DuraPrep and draped in the usual sterile fashion. A  "timeout" was performed as per usual protocol. The lower extremity was exsanguinated using an Esmarch, and the tourniquet was inflated to 300 mmHg. An anterior longitudinal incision was made followed by a standard mid vastus approach. The deep fibers of the medial collateral ligament were elevated in a subperiosteal fashion off of the medial flare of the tibia so as to maintain a continuous soft tissue sleeve. The patella was subluxed laterally and the patellofemoral ligament was incised. Inspection of the knee demonstrated severe degenerative changes with full-thickness loss of articular cartilage. Osteophytes were debrided using a rongeur. Anterior and posterior cruciate ligaments were excised. Two 4.0 mm Schanz pins were inserted in the femur and into the tibia for attachment of the array of trackers used for computer-assisted navigation. Hip center was identified using a circumduction technique. Distal landmarks were mapped using the computer. The distal femur and proximal tibia were mapped using the computer. The distal femoral cutting guide was positioned using computer-assisted navigation so as to achieve a 5 distal valgus cut. The femur was sized and it was felt that a size 5 femoral component was appropriate. A size 5 femoral cutting guide was positioned and the anterior cut was performed and verified using the computer. This was followed by completion of the posterior and chamfer cuts. Femoral cutting guide for the central box was then positioned in the center box cut was performed.  Attention was then directed to the proximal tibia. Medial and lateral menisci were excised. The extramedullary tibial cutting guide was positioned using computer-assisted navigation so as to achieve a 0 varus-valgus alignment and 3 posterior slope. The cut was performed and verified using the computer. The proximal tibia was sized and it was felt that a size 5 tibial tray was appropriate. Tibial and femoral trials were  inserted followed  by insertion of a 6 mm polyethylene insert. This allowed for excellent mediolateral soft tissue balancing both in flexion and in full extension. Finally, the patella was cut and prepared so as to accommodate a 35 mm medialized dome patella. A patella trial was placed and the knee was placed through a range of motion with excellent patellar tracking appreciated. The femoral trial was removed after debridement of posterior osteophytes. The central post-hole for the tibial component was reamed followed by insertion of a keel punch. Tibial trials were then removed. Cut surfaces of bone were irrigated with copious amounts of normal saline using pulsatile lavage and then suctioned dry. Polymethylmethacrylate cement with gentamicin was prepared in the usual fashion using a vacuum mixer. Cement was applied to the cut surface of the proximal tibia as well as along the undersurface of a size 5 rotating platform tibial component. Tibial component was positioned and impacted into place. Excess cement was removed using [employer] fast streamer. Cement was then applied to the cut surfaces of the femur as well as along the posterior flanges of the size 5 femoral component. The femoral component was positioned and impacted into place. Excess cement was removed using [employer] fast streamer. A 6 mm polyethylene trial was inserted and the knee was brought into full extension with steady axial compression applied. Finally, cement was applied to the backside of a 35 mm medialized dome patella and the patellar component was positioned and patellar clamp applied. Excess cement was removed using [employer] fast streamer. After adequate curing of the cement, the tourniquet was deflated after a total tourniquet time of 85 minutes. Hemostasis was achieved using electrocautery. The knee was irrigated with copious amounts of normal saline using pulsatile lavage followed by 450 ml of Surgiphor and then suctioned dry. 20 mL of 1.3% Exparel and 60 mL of  0.25% Marcaine in 40 mL of normal saline was injected along the posterior capsule, medial and lateral gutters, and along the arthrotomy site. A 6 mm stabilized rotating platform polyethylene insert was inserted and the knee was placed through a range of motion with excellent mediolateral soft tissue balancing appreciated and excellent patellar tracking noted. 2 medium drains were placed in the wound bed and brought out through separate stab incisions. The medial parapatellar portion of the incision was reapproximated using interrupted sutures of #1 Vicryl. Subcutaneous tissue was approximated in layers using first #0 Vicryl followed #2-0 Vicryl. The skin was approximated with skin staples. A sterile dressing was applied.  The patient tolerated the procedure well and was transported to the recovery room in stable condition.    Marcele Kosta P. Holley Bouche., M.D.

## 2021-09-12 NOTE — Assessment & Plan Note (Signed)
Denies shortness breath.  CHF seem to be compensated.  2D echo on 08/25/2017 showed EF of 50-55%. -Continue home torsemide

## 2021-09-12 NOTE — Assessment & Plan Note (Signed)
No chest pain -Continue Lipitor

## 2021-09-12 NOTE — Assessment & Plan Note (Signed)
-   Hold metoprolol, Cozaar since patient is at high risk of developing hypotension if heart rate drops -IV hydralazine as needed

## 2021-09-12 NOTE — Transfer of Care (Signed)
Immediate Anesthesia Transfer of Care Note  Patient: Bonnie Holt  Procedure(s) Performed: COMPUTER ASSISTED TOTAL KNEE ARTHROPLASTY (Left: Knee)  Patient Location: PACU  Anesthesia Type:Spinal  Level of Consciousness: awake, alert  and oriented  Airway & Oxygen Therapy: Patient Spontanous Breathing  Post-op Assessment: Report given to RN and Post -op Vital signs reviewed and stable  Post vital signs: Reviewed and stable  Last Vitals:  Vitals Value Taken Time  BP 116/49 09/12/21 1600  Temp 37.1 C 09/12/21 1552  Pulse 44 09/12/21 1602  Resp 17 09/12/21 1602  SpO2 97 % 09/12/21 1602  Vitals shown include unvalidated device data.  Last Pain:  Vitals:   09/12/21 0950  TempSrc: Temporal      Patients Stated Pain Goal: 0 (11/57/26 2035)  Complications: No notable events documented.

## 2021-09-12 NOTE — Assessment & Plan Note (Signed)
Recent A1c 5.6, well controlled.  Patient is taking semaglutide, glipizide and Levemir 5 units daily -Sliding scale insulin -Levemir 4 units daily

## 2021-09-12 NOTE — Assessment & Plan Note (Signed)
S/p of left knee replacement -Preventative metoprolol primary orthopedic team -Eliquis is restarted by Dr. Marry Guan

## 2021-09-12 NOTE — Assessment & Plan Note (Signed)
  BMI=38.23   and BW= 101.1 -Diet and exercise.   -Encouraged to lose weight.

## 2021-09-12 NOTE — Consult Note (Signed)
Medical Consultation   Bonnie Holt  XKG:[id number]  DOB: 07-28-1945  DOA: 09/12/2021  PCP: Kirk Ruths, MD   Outpatient Specialists:    Requesting physician:  Dr. Lubertha Basque of anesthesiology  Reason for consultation: -Bradycardia  History of Present Illness: Bonnie Holt is an 76 y.o. female with PMH of hypertension, hyperlipidemia, diabetes mellitus, atrial fibrillation on Eliquis, OSA on BiPAP, CAD, dCHF, breast cancer, underwent left knee replacement surgery today, developed bradycardia, will asked to consult on this patient.  Per Dr. Lubertha Basque, patient is scheduled for left knee replacement surgery by Dr. Marry Guan of Ortho, during the procedure, patient developed bradycardia with heart rate down to 20.  Patient was treated with atropine and 1 dose of bolus epinephrine, her heart rate improved to 40s.  When saw patient in PACU, patient is asymptomatic.  She denies any chest pain, shortness breath, lightheadedness or dizziness. Her HR is 38 when I saw pt. Patient states that she is constipated, no nausea, vomiting or abdominal pain.  No symptoms of UTI.  She has some mild pain in the surgical side currently.  Date reviewed, lab, image and vitals: Temperature normal, blood pressure 135/56, heart rate 39, RR 20, oxygen saturation 96% on room air.  Pending CBC and BMI.  EKG: I reviewed EKG independently.  Seems to be third-degree AV block, QTc 515, LAD, poor R wave progression, left bundle blockade.  Review of Systems:   General: no fevers, chills, no changes in body weight, no changes in appetite Skin: no rash HEENT: no blurry vision, hearing changes or sore throat Pulm: no dyspnea, coughing, wheezing CV: no chest pain, palpitations, shortness of breath.  Has bradycardia Abd: no nausea/vomiting, abdominal pain, diarrhea/constipation GU: no dysuria, hematuria, polyuria Ext: no arthralgias, myalgias Musculoskeletal: S/p of left knee replacement, has mild  pain in the surgical site Neuro: no weakness, numbness, or tingling    Past Medical History: Past Medical History:  Diagnosis Date   Arthritis    ASCUS with positive high risk HPV cervical    Basal cell carcinoma of skin    Bradycardia    Breast cancer, left ([place]) 11/19/2016   a.) stage IA (cT1b, cN0, cM0, G2, ER+, PR+, HER2-); OncotypeDx score = 6; s/p lumpectomy 12/16/2016 + adjuvant XRT (completed 03/20/2017) + 5 years endocrine (letrozole) therapy (completes 03/2022)   Chronic ischemic heart disease    CKD (chronic kidney disease), stage IV (HCC)    Coronary artery disease    a.) LHC/PCI in 2012 in [place]; BMS x 1 placed (unknown type/location)   Diastolic dysfunction 70/26/3785   a.) TTE 06/28/2017: EF 50%, mild LVH, mild LAE, triv PR, mild MR/TR, G1DD.   HLD (hyperlipidemia)    Hypertension    Left bundle branch block (LBBB)    Long term current use of anticoagulant    a.) apixaban   Long term current use of aromatase inhibitor    a.) letrozole; completes 5 years of therapy in 03/2022   OSA treated with BiPAP    Osteopenia    PAF (paroxysmal atrial fibrillation) (HCC)    a.) CHA2DS2-VASc = 5 (age x2, sex, HTN, T2DM); b.) rate/rhythm maintained on oral amiodarone + metoprolol succinate; chronically anticoagulated using standard dose apixaban   Personal history of radiation therapy    a.) for LEFT breast cancer; completed 03/2017   Tick fever    Type 2 diabetes mellitus treated with insulin ([place])  a.) has CGM in place    Past Surgical History: Past Surgical History:  Procedure Laterality Date   BREAST BIOPSY Left 11/19/2016   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   BREAST BIOPSY Left 11/19/2016   wing clip 2:30 4 cmfn Invasive mammary carcinoma   BREAST BIOPSY Left 11/19/2016   ribbon clip 3:00 6cmfn Invasive mammary carcinoma   BREAST BIOPSY Left 12/03/2016   LN biopsy, negative   BREAST BIOPSY Left 11/17/2017   affirm bx of calcs, x clip -FIBROSIS WITH DYSTROPHIC  CALCIFICATIONS   BREAST LUMPECTOMY Left 12/16/2016   excision of all 3 sites, LN negative   CORONARY ANGIOPLASTY WITH STENT PLACEMENT Left 2012   [employer]   DIALYSIS/PERMA CATHETER INSERTION N/A 09/09/2017   Procedure: DIALYSIS/PERMA CATHETER INSERTION;  Surgeon: Algernon Huxley, MD;  Location: [facility];  Service: Cardiovascular;  Laterality: N/A;   DIALYSIS/PERMA CATHETER REMOVAL N/A 10/04/2017   Procedure: DIALYSIS/PERMA CATHETER REMOVAL;  Surgeon: Algernon Huxley, MD;  Location: [facility];  Service: Cardiovascular;  Laterality: N/A;   ESOPHAGOGASTRODUODENOSCOPY (EGD) WITH PROPOFOL N/A 09/07/2017   Procedure: ESOPHAGOGASTRODUODENOSCOPY (EGD) WITH PROPOFOL;  Surgeon: Lucilla Lame, MD;  Location: [facility];  Service: Endoscopy;  Laterality: N/A;   PARTIAL MASTECTOMY WITH NEEDLE LOCALIZATION Left 12/16/2016   Procedure: PARTIAL MASTECTOMY WITH NEEDLE LOCALIZATION;  Surgeon: Herbert Pun, MD;  Location: [facility];  Service: General;  Laterality: Left;   SENTINEL NODE BIOPSY Left 12/16/2016   Procedure: SENTINEL NODE BIOPSY;  Surgeon: Herbert Pun, MD;  Location: [facility];  Service: General;  Laterality: Left;     Allergies:   Allergies  Allergen Reactions   Levofloxacin Swelling    Swelling of joints Swelling of joints    Ace Inhibitors Cough   Warfarin Other (See Comments) and Swelling    weakness     Social History:  reports that she has never smoked. She has never used smokeless tobacco. She reports current alcohol use. She reports that she does not use drugs.  Family History: Family History  Problem Relation Age of Onset   Brain cancer Father    Diabetes Father    Hypertension Brother    Heart Problems Maternal Aunt    Diabetes Paternal Aunt    Heart Problems Maternal Grandmother    Dementia Paternal Grandmother    Heart Problems Brother    Hypertension Brother    Asthma Maternal Aunt    Arthritis Maternal Aunt     Diabetes Paternal Aunt    Cancer Paternal Uncle    Breast cancer Neg Hx      Physical Exam: Vitals:   09/12/21 1748 09/12/21 1815 09/12/21 1830 09/12/21 1841  BP: (!) 144/62 (!) 123/91 (!) 141/69 (!) 143/61  Pulse: (!) 39 (!) 38 (!) 40   Resp: $Remo'14 11 15 16  'otrmS$ Temp:  (!) 97 F (36.1 C)  (!) 97.5 F (36.4 C)  TempSrc:    Axillary  SpO2: 100% 99% 100% 96%  Weight:    101.1 kg  Height:         General: Not in acute distress HEENT:       Eyes: PERRL, EOMI, no scleral icterus.       ENT: No discharge from the ears and nose, no pharynx injection, no tonsillar enlargement.        Neck: No JVD, no bruit, no mass felt. Heme: No neck lymph node enlargement. Cardiac: S1/S2, RRR, bradycardia, no murmurs, No gallops or rubs. Respiratory: No rales,  wheezing, rhonchi or rubs. GI: Soft, nondistended, nontender, no rebound pain, no organomegaly, BS present. GU: No hematuria Ext: No pitting leg edema bilaterally. 1+DP/PT pulse bilaterally. Musculoskeletal: S/p of left knee replacement, has mild pain in the surgical site Skin: No rashes.  Neuro: Alert, oriented X3, cranial nerves II-XII grossly intact, moves all extremities normally. Psych: Patient is not psychotic, no suicidal or hemocidal ideation.    Data reviewed:  I have personally reviewed following labs and imaging studies Labs:  CBC: Recent Labs  Lab 09/12/21 1803  WBC 6.8  HGB 12.0  HCT 37.2  MCV 98.4  PLT 338    Basic Metabolic Panel: Recent Labs  Lab 09/12/21 1803  NA 136  K 5.2*  CL 109  CO2 21*  GLUCOSE 234*  BUN 35*  CREATININE 2.19*  CALCIUM 8.5*   GFR Estimated Creatinine Clearance: 24.3 mL/min (A) (by C-G formula based on SCr of 2.19 mg/dL (H)). Liver Function Tests: No results for input(s): "AST", "ALT", "ALKPHOS", "BILITOT", "PROT", "ALBUMIN" in the last 168 hours. No results for input(s): "LIPASE", "AMYLASE" in the last 168 hours. No results for input(s): "AMMONIA" in the last 168  hours. Coagulation profile No results for input(s): "INR", "PROTIME" in the last 168 hours.  Cardiac Enzymes: No results for input(s): "CKTOTAL", "CKMB", "CKMBINDEX", "TROPONINI" in the last 168 hours. BNP: Invalid input(s): "POCBNP" CBG: Recent Labs  Lab 09/12/21 0947 09/12/21 1558 09/12/21 1840  GLUCAP 126* 191* 226*   D-Dimer No results for input(s): "DDIMER" in the last 72 hours. Hgb A1c No results for input(s): "HGBA1C" in the last 72 hours. Lipid Profile No results for input(s): "CHOL", "HDL", "LDLCALC", "TRIG", "CHOLHDL", "LDLDIRECT" in the last 72 hours. Thyroid function studies No results for input(s): "TSH", "T4TOTAL", "T3FREE", "THYROIDAB" in the last 72 hours.  Invalid input(s): "FREET3" Anemia work up No results for input(s): "VITAMINB12", "FOLATE", "FERRITIN", "TIBC", "IRON", "RETICCTPCT" in the last 72 hours. Urinalysis    Component Value Date/Time   COLORURINE YELLOW (A) 09/02/2021 1103   APPEARANCEUR CLEAR (A) 09/02/2021 1103   LABSPEC 1.016 09/02/2021 1103   PHURINE 5.0 09/02/2021 1103   GLUCOSEU NEGATIVE 09/02/2021 1103   HGBUR NEGATIVE 09/02/2021 1103   BILIRUBINUR NEGATIVE 09/02/2021 1103   KETONESUR NEGATIVE 09/02/2021 1103   PROTEINUR NEGATIVE 09/02/2021 1103   NITRITE NEGATIVE 09/02/2021 1103   LEUKOCYTESUR MODERATE (A) 09/02/2021 1103     Microbiology Recent Results (from the past 240 hour(s))  Urine Culture     Status: Abnormal   Collection Time: 09/03/21 11:02 AM   Specimen: Urine, Clean Catch  Result Value Ref Range Status   Specimen Description   Final    URINE, CLEAN CATCH Performed at [facility], [address], [place], [place] [address]    Special Requests   Final    NONE Performed at [facility], [address], [place], [place] [address]    Culture (A)  Final    <10,000 COLONIES/mL INSIGNIFICANT GROWTH Performed at [facility], [place] [address] [place], [place] [address]    Report Status  09/04/2021 FINAL  Final       Inpatient Medications:   Scheduled Meds:  [START ON 09/13/2021] apixaban  5 mg Oral BID   atorvastatin  80 mg Oral Daily   calcium citrate-vitamin D  1 tablet Oral Daily   chlorhexidine  60 mL Topical Once   [START ON 09/13/2021] ferrous sulfate  325 mg Oral BID WC   insulin aspart  0-5 Units  Subcutaneous QHS   [START ON 09/13/2021] insulin aspart  0-9 Units Subcutaneous TID WC   [START ON 09/13/2021] insulin detemir  4 Units Subcutaneous QAC breakfast   letrozole  2.5 mg Oral Daily   magnesium hydroxide  30 mL Oral Daily   metoCLOPramide  10 mg Oral TID AC & HS   multivitamin  1 tablet Oral QHS   pantoprazole  40 mg Oral BID   senna-docusate  1 tablet Oral BID   senna-docusate  1 tablet Oral BID   Vitamin D-3  1 tablet Oral Daily   Continuous Infusions:  sodium chloride     acetaminophen      ceFAZolin (ANCEF) IV     tranexamic acid       Radiological Exams on Admission: DG Knee Left Port  Result Date: 09/12/2021 CLINICAL DATA:  Left knee surgery EXAM: PORTABLE LEFT KNEE - 1-2 VIEW COMPARISON:  None Available. FINDINGS: Postsurgical changes from left total knee arthroplasty. Arthroplasty components are in their expected alignment. No periprosthetic fracture or evidence of other complication. Expected postoperative changes within the overlying soft tissues including surgical drains in the suprapatellar region. IMPRESSION: Satisfactory postoperative appearance status post left total knee arthroplasty. Electronically Signed   By: Davina Poke D.O.   On: 09/12/2021 17:02   DG Outside Films Extremity  Result Date: 09/12/2021 This examination belongs to an outside facility and is stored here for comparison purposes only.  Contact the originating outside institution for any associated report or interpretation.   Impression/Recommendations Principal Problem:   Total knee replacement status Active Problems:   Bradycardia   Chronic diastolic CHF  (congestive heart failure) (HCC)   Paroxysmal atrial fibrillation (HCC)   CAD (coronary artery disease)   Primary cancer of upper outer quadrant of left female breast ([place])   Type 2 diabetes mellitus with stage 4 chronic kidney disease (HCC)   Chronic kidney disease, stage 4 (severe) (HCC)   HTN (hypertension)   OSA treated with BiPAP   HLD (hyperlipidemia)   Obesity with body mass index (BMI) of 30.0 to 39.9    Assessment and Plan: * Total knee replacement status S/p of left knee replacement -Preventative metoprolol primary orthopedic team -Eliquis is restarted by Dr. 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  Hemodynamically stable -Dr. Charlynn Grimes consulted Dr. Saralyn Pilar of cardiology -Hold amiodarone, metoprolol -As needed atropine for symptomatic bradycardia -Pacer to the bedside -stat BMP and CBC  Chronic diastolic CHF (congestive heart failure) ([place]) Denies shortness breath.  CHF seem to be compensated.  2D echo on 08/25/2017 showed EF of 50-55%. -Continue home torsemide  Paroxysmal atrial fibrillation (HCC) - Hold amiodarone and metoprolol -Eliquis restarted  CAD (coronary artery disease) No chest pain -Continue Lipitor  Primary cancer of upper outer quadrant of left female breast (HCC) - Continue letrozole  Type 2 diabetes mellitus with stage 4 chronic kidney disease (HCC) Recent A1c 5.6, well controlled.  Patient is taking semaglutide, glipizide and Levemir 5 units daily -Sliding scale insulin -Levemir 4 units daily  Chronic kidney disease, stage 4 (severe) (HCC) Recent baseline creatinine 1.70 on 09/02/2021. -Follow-up BMP  HTN (hypertension) - Hold metoprolol, Cozaar since patient is at high risk of developing hypotension if heart rate drops -IV hydralazine as needed  OSA treated with BiPAP - BiPAP is ordered  HLD (hyperlipidemia) - Lipitor  Obesity with body mass index (BMI) of 30.0 to 39.9  BMI=38.23    and BW= 101.1 -Diet and exercise.   -  Encouraged to lose weight.            Thank you for this consultation.  Our [facility] hospitalist team will follow the patient with you.  Time Spent: 35 min     Ivor Costa M.D. [facility] 09/12/2021, 7:04 PM      D\

## 2021-09-12 NOTE — Anesthesia Postprocedure Evaluation (Addendum)
Anesthesia Post Note  Patient: [name]  Procedure(s) Performed: COMPUTER ASSISTED TOTAL KNEE ARTHROPLASTY (Left: Knee)  Patient location during evaluation: PACU Anesthesia Type: Spinal Level of consciousness: awake and alert Pain management: pain level controlled Respiratory status: spontaneous breathing, nonlabored ventilation and respiratory function stable Cardiovascular status: blood pressure returned to baseline Postop Assessment: no apparent nausea or vomiting Anesthetic complications: no Comments:   Intraoperatively, pt suddenly developed profound bradycardia down to HR 28 and HR remained in the 30's with associated hypotension for several minutes. This event occurred during surgical closure. She was given 0.2 mg glycopyrrolate, 0.6 mg atropine and 40 mcg epinephrine with subsequent improvement in HR and BP.   Post op she is asymptomatic - denies chest pressure, lightheadedness, palpitations - and BP is stable without any pressor support. 12-lead EKG's showed bradycardia with widened QRS, LBBB with one tracing concerning for possible CHB. Pt states her baseline HR is 60.  Hospitalist service and Cardiologist consulted. I personally spoke with Dr. Porfirio Mylar and Dr. Saralyn Pilar. Pt will be admitted to step down for further management.   No notable events documented.   Last Vitals:  Vitals:   09/12/21 1700 09/12/21 1715  BP: (!) 135/56 (!) 143/60  Pulse: (!) 44 (!) 43  Resp: 20 14  Temp:    SpO2: 97% 100%    Last Pain:  Vitals:   09/12/21 1700  TempSrc:   PainSc: 0-No pain                 Brett Canales Araeya Lamb

## 2021-09-12 NOTE — Assessment & Plan Note (Signed)
Lipitor 

## 2021-09-12 NOTE — Assessment & Plan Note (Signed)
-   Continue letrozole 

## 2021-09-12 NOTE — Assessment & Plan Note (Signed)
Recent baseline creatinine 1.70 on 09/02/2021. -Follow-up BMP

## 2021-09-12 NOTE — H&P (Signed)
ORTHOPAEDIC HISTORY & PHYSICAL Gwenlyn Fudge, [place] - 09/04/2021 4:00 PM EDT Formatting of this note is different from the original. [facility] Chief Complaint:   Chief Complaint  Patient presents with  Knee Pain  H & P LEFT KNEE   History of Present Illness:   Bonnie Holt is a 76 y.o. female that presents to clinic today for her preoperative history and evaluation. Patient presents with her husband. The patient is scheduled to undergo a left total knee arthroplasty on 09/12/21 by Dr. Marry Guan. Her pain began several years ago. The pain is located primarily along the medial aspect of the knee. She is making She describes her pain as worse with going up and down stairs and weightbearing. She reports associated swelling with some giving way of the knee. She denies associated numbness or tingling, denies locking.   The patient's symptoms have progressed to the point that they decrease her quality of life. The patient has previously undergone conservative treatment including NSAIDS and injections to the knee without adequate control of her symptoms.  Patient has been cleared by cardiology. She has history of paroxysmal A-fib and is anticoagulated with Eliquis. Denies history of lumbar surgery, DVT. Her husband will be home to assist post-operatively.   Past Medical, Surgical, Family, Social History, Allergies, Medications:   Past Medical History:  Past Medical History:  Diagnosis Date  Abnormal screening cardiac CT  Arthritis of knee  ASCUS with positive high risk HPV cervical  Cervicalgia  Chronic ischemic heart disease  Chronic osteoarthritis  Cough  Dermatomycosis  Diabetes mellitus type 2, uncomplicated (CMS-HCC)  Dyspnea and respiratory abnormalities  Hyperlipidemia  Hypertension  Malaise and fatigue  Mild dysplasia of cervix  Morbid obesity with body mass index of 40.0-44.9 in adult (CMS-HCC)  Obstructive sleep apnea   Osteopenia  Primary cancer of upper outer quadrant of left female breast (CMS-HCC) 12/06/2016  Type 2 diabetes mellitus with stage 4 chronic kidney disease (CMS-HCC)  Urinary incontinence, mixed  Vulvitis   Past Surgical History:  Past Surgical History:  Procedure Laterality Date  CORONARY ANGIOPLASTY WITH STENT PLACEMENT June 2012  CERVICAL BIOPSY W/ LOOP ELECTRODE EXCISION 12/24/2010  COLPOSCOPY 07/31/2014  Mild Dysplasia  Cryosurgery of Cervix 09/03/2014  MASTECTOMY, PARTIAL Left 12/16/2016  Dr Reeves Forth Windell Moment   Current Medications:  Current Outpatient Medications  Medication Sig Dispense Refill  AMIOdarone (PACERONE) 200 MG tablet TAKE 1 TABLET BY MOUTH DAILY 90 tablet 3  apixaban (ELIQUIS) 5 mg tablet Take 1 tablet (5 mg total) by mouth every 12 (twelve) hours 180 tablet 3  atorvastatin (LIPITOR) 80 MG tablet TAKE ONE TABLET EVERY DAY 90 tablet 3  biotin 5,000 mcg TbDL Take 1 tablet by mouth once daily  cephalexin (KEFLEX) 500 MG capsule Take 500 mg by mouth 2 (two) times daily  cholecalciferol, vitamin D3, (VITAMIN D3) 125 mcg (5,000 unit) tablet Take by mouth once daily  COQ10, LIPOSOMAL UBIQUINOL, ORAL Take 100 mg by mouth once daily  diphenhydrAMINE (BENADRYL) 25 mg capsule Take 25 mg by mouth at bedtime as needed for Itching  flash glucose scanning reader (FREESTYLE LIBRE 14 DAY READER MISC) Use 1 Units as directed  FREESTYLE LIBRE 14 DAY SENSOR kit USE ONE SENSOR EVERY 14 DAYS AS DIRECTED 6 kit 1  glipiZIDE (GLUCOTROL XL) 5 MG XL tablet TAKE ONE TABLET EVERY DAY 90 tablet 1  insulin DETEMIR (LEVEMIR FLEXTOUCH U100 INSULIN) pen injector (concentration 100 units/mL) Inject 5 Units subcutaneously every  morning  letrozole (FEMARA) 2.5 mg tablet TAKE ONE TABLET BY MOUTH EVERY DAY 90 tablet 1  losartan (COZAAR) 25 MG tablet TAKE ONE TABLET EVERY DAY 90 tablet 3  melatonin 10 mg Tab Take by mouth  metoprolol succinate (TOPROL-XL) 50 MG XL tablet TAKE ONE TABLET EVERY DAY  90 tablet 3  multivitamin-lutein (CENTRUM SILVER) tablet Take 1 tablet by mouth  semaglutide (RYBELSUS) 14 mg tablet Take 1 tablet (14 mg total) by mouth once daily Do not cut, crush, or chew 120 tablet 0  sennosides (SENOKOT) 8.6 mg tablet Take 2 tablets by mouth once daily  TORsemide (DEMADEX) 20 MG tablet Take 20 mg by mouth once daily as needed  zinc sulfate (ZINC-15 ORAL) Take 1 tablet by mouth once daily   No current facility-administered medications for this visit.   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  Tobacco Use  Smoking status: Never  Smokeless tobacco: Never  Tobacco comments:  never used  Surveyor, mining Use: Never used  Substance and Sexual Activity  Alcohol use: Not Currently  Comment: 2x year  Drug use: No  Sexual activity: Defer  Partners: Male  Birth control/protection: None   Family History:  Family History  Problem Relation Age of Onset  Lung cancer Father  Brain cancer Father  Dementia Maternal Grandmother  High blood pressure (Hypertension) Brother  Obesity Brother   Review of Systems:   A 10+ ROS was performed, reviewed, and the pertinent orthopaedic findings are documented in the HPI.   Physical Examination:   BP 130/70 (BP Location: Right upper arm, Patient Position: Sitting, BP Cuff Size: Large Adult)  Ht 157.5 cm ($RemoveB'5\' 2"'lNGqGhoV$ )  Wt 94.2 kg (207 lb 9.6 oz)  BMI 37.97 kg/m   Patient is a well-developed, well-nourished female in no acute distress. Patient has normal mood and affect. Patient is alert and oriented to person, place, and time.   HEENT: Atraumatic, normocephalic. Pupils  equal and reactive to light. Extraocular motion intact. Noninjected sclera.  Cardiovascular: Quiet precordium. Bradycardic rate with regular rhythm, with no murmurs, rubs, or gallops. Distal pulses auscultated with Doppler.  Respiratory: Lungs clear to auscultation bilaterally.   Left Knee: Soft tissue swelling: minimal Effusion: none Erythema: none Crepitance: mild Tenderness: medial Alignment: relative varus Mediolateral laxity: medial pseudolaxity Posterior sag: negative Patellar tracking: Good tracking without evidence of subluxation or tilt Atrophy: No significant atrophy.  Quadriceps tone was fair to good. Range of motion: 0/10/105 degrees   Patient would actively plantarflex and dorsiflex the left ankle. Able to flex and extend the toes.  Sensation intact over the saphenous, lateral sural cutaneous, superficial fibular, and deep fibular nerve distributions.  Tests Performed/Reviewed:  X-rays  3 views of the left knee were reviewed. Images reveal severe loss of medial compartment joint space with osteophyte formation. No fractures or dislocations. No other osseous abnormality noted  Impression:   ICD-10-CM  1. Primary osteoarthritis of left knee M17.12   Plan:   The patient has end-stage degenerative changes of the left knee. It was explained to the patient that the condition is progressive in nature. Having failed conservative treatment, the patient has elected  to proceed with a total joint arthroplasty. The patient will undergo a total joint arthroplasty with Dr. Marry Guan. The risks of surgery, including blood clot and infection, were discussed with the patient. Measures to reduce these risks, including the use of anticoagulation, perioperative antibiotics, and early ambulation were discussed. The importance of postoperative physical therapy was discussed with the patient. The patient elects to proceed with surgery. The patient is instructed to stop all blood thinners prior  to surgery. The patient is instructed to call the hospital the day before surgery to learn of the proper arrival time.   Contact our office with any questions or concerns. Follow up as indicated, or sooner should any new problems arise, if conditions worsen, or if they are otherwise concerned.   Gwenlyn Fudge, [facility] [place], [place] [address] Phone: [phone]  This note was generated in part with voice recognition software and I apologize for any typographical errors that were not detected and corrected.  Electronically signed by Gwenlyn Fudge, PA at 09/04/2021 5:08 PM EDT

## 2021-09-12 NOTE — Anesthesia Preprocedure Evaluation (Addendum)
Anesthesia Evaluation  Patient identified by MRN, date of birth, ID band Patient awake    Reviewed: Allergy & Precautions, H&P , NPO status , Patient's Chart, lab work & pertinent test results, reviewed documented beta blocker date and time   Airway Mallampati: II   Neck ROM: full    Dental  (+) Poor Dentition   Pulmonary sleep apnea and Continuous Positive Airway Pressure Ventilation ,    Pulmonary exam normal        Cardiovascular Exercise Tolerance: Poor hypertension, On Medications + CAD  + dysrhythmias Atrial Fibrillation  Rhythm:regular Rate:Normal     Neuro/Psych negative neurological ROS  negative psych ROS   GI/Hepatic Neg liver ROS, PUD,   Endo/Other  negative endocrine ROSdiabetes, Well Controlled, Type 2, Oral Hypoglycemic Agents  Renal/GU Renal disease  negative genitourinary   Musculoskeletal   Abdominal   Peds  Hematology negative hematology ROS (+)   Anesthesia Other Findings Past Medical History: No date: Arthritis No date: ASCUS with positive high risk HPV cervical No date: Basal cell carcinoma of skin No date: Bradycardia 11/19/2016: Breast cancer, left (HCC)     Comment:  a.) stage IA (cT1b, cN0, cM0, G2, ER+, PR+, HER2-);               OncotypeDx score = 6; s/p lumpectomy 12/16/2016 +               adjuvant XRT (completed 03/20/2017) + 5 years endocrine               (letrozole) therapy (completes 03/2022) No date: Chronic ischemic heart disease No date: CKD (chronic kidney disease), stage IV (HCC) No date: Coronary artery disease     Comment:  a.) LHC/PCI in 2012 in [place]; BMS x 1 placed (unknown               type/location) 49/82/6415: Diastolic dysfunction     Comment:  a.) TTE 06/28/2017: EF 50%, mild LVH, mild LAE, triv PR,              mild MR/TR, G1DD. No date: HLD (hyperlipidemia) No date: Hypertension No date: Left bundle branch block (LBBB) No date: Long term current use of  anticoagulant     Comment:  a.) apixaban No date: Long term current use of aromatase inhibitor     Comment:  a.) letrozole; completes 5 years of therapy in 03/2022 No date: OSA treated with BiPAP No date: Osteopenia No date: PAF (paroxysmal atrial fibrillation) (HCC)     Comment:  a.) CHA2DS2-VASc = 5 (age x2, sex, HTN, T2DM); b.)               rate/rhythm maintained on oral amiodarone + metoprolol               succinate; chronically anticoagulated using standard dose              apixaban No date: Personal history of radiation therapy     Comment:  a.) for LEFT breast cancer; completed 03/2017 No date: Tick fever No date: Type 2 diabetes mellitus treated with insulin ([place])     Comment:  a.) has CGM in place Past Surgical History: 11/19/2016: BREAST BIOPSY; Left     Comment:  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 11/19/2016: BREAST BIOPSY; Left     Comment:  wing clip 2:30 4 cmfn Invasive mammary carcinoma 11/19/2016: BREAST BIOPSY; Left     Comment:  ribbon clip 3:00 6cmfn Invasive mammary carcinoma 12/03/2016: BREAST  BIOPSY; Left     Comment:  LN biopsy, negative 11/17/2017: BREAST BIOPSY; Left     Comment:  affirm bx of calcs, x clip -FIBROSIS WITH DYSTROPHIC               CALCIFICATIONS 12/16/2016: BREAST LUMPECTOMY; Left     Comment:  excision of all 3 sites, LN negative 2012: CORONARY ANGIOPLASTY WITH STENT PLACEMENT; Left     Comment:  [employer] 09/09/2017: DIALYSIS/PERMA CATHETER INSERTION; N/A     Comment:  Procedure: DIALYSIS/PERMA CATHETER INSERTION;  Surgeon:               Algernon Huxley, MD;  Location: [facility];                Service: Cardiovascular;  Laterality: N/A; 10/04/2017: DIALYSIS/PERMA CATHETER REMOVAL; N/A     Comment:  Procedure: DIALYSIS/PERMA CATHETER REMOVAL;  Surgeon:               Algernon Huxley, MD;  Location: [facility];                Service: Cardiovascular;  Laterality: N/A; 09/07/2017: ESOPHAGOGASTRODUODENOSCOPY (EGD) WITH  PROPOFOL; N/A     Comment:  Procedure: ESOPHAGOGASTRODUODENOSCOPY (EGD) WITH               PROPOFOL;  Surgeon: Lucilla Lame, MD;  Location: [facility]               ENDOSCOPY;  Service: Endoscopy;  Laterality: N/A; 12/16/2016: PARTIAL MASTECTOMY WITH NEEDLE LOCALIZATION; Left     Comment:  Procedure: PARTIAL MASTECTOMY WITH NEEDLE LOCALIZATION;               Surgeon: Herbert Pun, MD;  Location: [facility] ORS;               Service: General;  Laterality: Left; 12/16/2016: SENTINEL NODE BIOPSY; Left     Comment:  Procedure: SENTINEL NODE BIOPSY;  Surgeon: Herbert Pun, MD;  Location: [facility];  Service: General;                Laterality: Left; BMI    Body Mass Index: 38.23 kg/m     Reproductive/Obstetrics negative OB ROS                             Anesthesia Physical Anesthesia Plan  ASA: 3  Anesthesia Plan: General   Post-op Pain Management:    Induction:   PONV Risk Score and Plan:   Airway Management Planned:   Additional Equipment:   Intra-op Plan:   Post-operative Plan:   Informed Consent: I have reviewed the patients History and Physical, chart, labs and discussed the procedure including the risks, benefits and alternatives for the proposed anesthesia with the patient or authorized representative who has indicated his/her understanding and acceptance.     Dental Advisory Given  Plan Discussed with: CRNA  Anesthesia Plan Comments:         Anesthesia Quick Evaluation

## 2021-09-13 DIAGNOSIS — M1712 Unilateral primary osteoarthritis, left knee: Secondary | ICD-10-CM | POA: Diagnosis present

## 2021-09-13 DIAGNOSIS — Z808 Family history of malignant neoplasm of other organs or systems: Secondary | ICD-10-CM | POA: Diagnosis not present

## 2021-09-13 DIAGNOSIS — Z96652 Presence of left artificial knee joint: Secondary | ICD-10-CM | POA: Diagnosis not present

## 2021-09-13 DIAGNOSIS — I130 Hypertensive heart and chronic kidney disease with heart failure and stage 1 through stage 4 chronic kidney disease, or unspecified chronic kidney disease: Secondary | ICD-10-CM | POA: Diagnosis present

## 2021-09-13 DIAGNOSIS — Z853 Personal history of malignant neoplasm of breast: Secondary | ICD-10-CM | POA: Diagnosis not present

## 2021-09-13 DIAGNOSIS — Z79899 Other long term (current) drug therapy: Secondary | ICD-10-CM | POA: Diagnosis not present

## 2021-09-13 DIAGNOSIS — Z7901 Long term (current) use of anticoagulants: Secondary | ICD-10-CM | POA: Diagnosis not present

## 2021-09-13 DIAGNOSIS — N179 Acute kidney failure, unspecified: Secondary | ICD-10-CM | POA: Diagnosis present

## 2021-09-13 DIAGNOSIS — Z955 Presence of coronary angioplasty implant and graft: Secondary | ICD-10-CM | POA: Diagnosis not present

## 2021-09-13 DIAGNOSIS — Z923 Personal history of irradiation: Secondary | ICD-10-CM | POA: Diagnosis not present

## 2021-09-13 DIAGNOSIS — I480 Paroxysmal atrial fibrillation: Secondary | ICD-10-CM | POA: Diagnosis present

## 2021-09-13 DIAGNOSIS — I5032 Chronic diastolic (congestive) heart failure: Secondary | ICD-10-CM | POA: Diagnosis present

## 2021-09-13 DIAGNOSIS — G4733 Obstructive sleep apnea (adult) (pediatric): Secondary | ICD-10-CM | POA: Diagnosis present

## 2021-09-13 DIAGNOSIS — Z85828 Personal history of other malignant neoplasm of skin: Secondary | ICD-10-CM | POA: Diagnosis not present

## 2021-09-13 DIAGNOSIS — Z794 Long term (current) use of insulin: Secondary | ICD-10-CM | POA: Diagnosis not present

## 2021-09-13 DIAGNOSIS — Z801 Family history of malignant neoplasm of trachea, bronchus and lung: Secondary | ICD-10-CM | POA: Diagnosis not present

## 2021-09-13 DIAGNOSIS — R001 Bradycardia, unspecified: Secondary | ICD-10-CM | POA: Diagnosis not present

## 2021-09-13 DIAGNOSIS — Z6841 Body Mass Index (BMI) 40.0 and over, adult: Secondary | ICD-10-CM | POA: Diagnosis not present

## 2021-09-13 DIAGNOSIS — Z9012 Acquired absence of left breast and nipple: Secondary | ICD-10-CM | POA: Diagnosis not present

## 2021-09-13 DIAGNOSIS — I2510 Atherosclerotic heart disease of native coronary artery without angina pectoris: Secondary | ICD-10-CM | POA: Diagnosis present

## 2021-09-13 DIAGNOSIS — Z8249 Family history of ischemic heart disease and other diseases of the circulatory system: Secondary | ICD-10-CM | POA: Diagnosis not present

## 2021-09-13 DIAGNOSIS — Z7984 Long term (current) use of oral hypoglycemic drugs: Secondary | ICD-10-CM | POA: Diagnosis not present

## 2021-09-13 DIAGNOSIS — E1122 Type 2 diabetes mellitus with diabetic chronic kidney disease: Secondary | ICD-10-CM | POA: Diagnosis present

## 2021-09-13 DIAGNOSIS — N184 Chronic kidney disease, stage 4 (severe): Secondary | ICD-10-CM | POA: Diagnosis present

## 2021-09-13 DIAGNOSIS — I442 Atrioventricular block, complete: Secondary | ICD-10-CM | POA: Diagnosis present

## 2021-09-13 LAB — MAGNESIUM: Magnesium: 2.6 mg/dL — ABNORMAL HIGH (ref 1.7–2.4)

## 2021-09-13 LAB — BASIC METABOLIC PANEL
Anion gap: 3 — ABNORMAL LOW (ref 5–15)
BUN: 40 mg/dL — ABNORMAL HIGH (ref 8–23)
CO2: 21 mmol/L — ABNORMAL LOW (ref 22–32)
Calcium: 8.4 mg/dL — ABNORMAL LOW (ref 8.9–10.3)
Chloride: 111 mmol/L (ref 98–111)
Creatinine, Ser: 2.52 mg/dL — ABNORMAL HIGH (ref 0.44–1.00)
GFR, Estimated: 19 mL/min — ABNORMAL LOW (ref >60)
Glucose, Bld: 205 mg/dL — ABNORMAL HIGH (ref 70–99)
Potassium: 5.1 mmol/L (ref 3.5–5.1)
Sodium: 135 mmol/L (ref 135–145)

## 2021-09-13 LAB — GLUCOSE, CAPILLARY
Glucose-Capillary: 200 mg/dL — ABNORMAL HIGH (ref 70–99)
Glucose-Capillary: 196 mg/dL — ABNORMAL HIGH (ref 70–99)
Glucose-Capillary: 207 mg/dL — ABNORMAL HIGH (ref 70–99)
Glucose-Capillary: 235 mg/dL — ABNORMAL HIGH (ref 70–99)
Glucose-Capillary: 141 mg/dL — ABNORMAL HIGH (ref 70–99)

## 2021-09-13 NOTE — Progress Notes (Signed)
Progress Note   Patient: Bonnie Holt JSE:[id number] DOB: 1945/07/24 DOA: 09/12/2021     0 DOS: the patient was seen and examined on 09/13/2021   Brief hospital course: Taken from consult note.  Bonnie Holt is an 76 y.o. female with PMH of hypertension, hyperlipidemia, diabetes mellitus, atrial fibrillation on Eliquis, OSA on BiPAP, CAD, dCHF, breast cancer, underwent left knee replacement surgery today, developed bradycardia, will asked to consult on this patient.   Per Dr. Lubertha Basque, patient is scheduled for left knee replacement surgery by Dr. Marry Guan of Ortho, during the procedure, patient developed bradycardia with heart rate down to 20.  Patient was treated with atropine and 1 dose of bolus epinephrine, her heart rate improved to 40s.  When saw patient in PACU, patient is asymptomatic.  She denies any chest pain, shortness breath, lightheadedness or dizziness. Her HR is 38 when I saw pt. Patient states that she is constipated, no nausea, vomiting or abdominal pain.  No symptoms of UTI.  She has some mild pain in the surgical side currently.   Date reviewed, lab, image and vitals: Temperature normal, blood pressure 135/56, heart rate 39, RR 20, oxygen saturation 96% on room air.   EKG: I reviewed EKG independently.  Seems to be third-degree AV block, QTc 515, LAD, poor R wave progression, left bundle blockade. The repeated EKG showed junctional rhythm.  Cardiology was also consulted by orthopedic surgery.  7/1: Patient remained asymptomatic.  Cardiology saw the patient this morning and they are thinking she is having slow atrial fibrillation, patient was on amiodarone and metoprolol for A-fib at home, they are recommending keep holding as it was done before and continue to monitor.  No intervention needed at this time. BMP with worsening of renal function with BUN of 40 and creatinine of 2.52.  Baseline seems to be around 1.7.  CBC stable.   Assessment and Plan: * Total knee replacement  status S/p of left knee replacement -Eliquis is restarted by Dr. 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 Per cardiology patient might be having slow A-fib. -Dr. Charlynn Grimes consulted Dr. Saralyn Pilar of cardiology -Keep holding amiodarone, metoprolol -As needed atropine for symptomatic bradycardia -Pacer to the bedside   Chronic diastolic CHF (congestive heart failure) ([place]) Denies shortness breath.  CHF seem to be compensated.  2D echo on 08/25/2017 showed EF of 50-55%. -Continue home torsemide  Paroxysmal atrial fibrillation (HCC) - Hold amiodarone and metoprolol -Eliquis restarted  CAD (coronary artery disease) No chest pain -Continue Lipitor  Primary cancer of upper outer quadrant of left female breast (HCC) - Continue letrozole  Type 2 diabetes mellitus with stage 4 chronic kidney disease (HCC) Recent A1c 5.6, well controlled.  Patient is taking semaglutide, glipizide and Levemir 5 units daily -Sliding scale insulin -Levemir 4 units daily  Chronic kidney disease, stage 4 (severe) (HCC) AKI with CKD stage IV, creatinine at 2.56 today. Recent baseline creatinine 1.70 on 09/02/2021. -Encourage p.o. hydration -Monitor renal function -Avoid nephrotoxins  HTN (hypertension) - Hold metoprolol, Cozaar since patient is at high risk of developing hypotension if heart rate drops -IV hydralazine as needed  OSA treated with BiPAP - BiPAP is ordered  HLD (hyperlipidemia) - Lipitor  Obesity with body mass index (BMI) of 30.0 to 39.9 Estimated body mass index is 40.77 kg/m as calculated from the following:   Height as of this encounter: '5\' 2"'$  (1.575 m).  Weight as of this encounter: 101.1 kg.  -Diet and exercise.   -Encouraged to lose weight.    Subjective: Patient was seen and examined today.  She was sitting comfortably in chair.  Denies any chest pain,  shortness of breath or dizziness.  Physical Exam: Vitals:   09/13/21 1205 09/13/21 1300 09/13/21 1400 09/13/21 1500  BP:   (!) 113/101 127/60  Pulse: (!) 38 (!) 43 (!) 34 (!) 34  Resp: '15 15 16 17  '$ Temp:      TempSrc:      SpO2: 99% 98% 100% 98%  Weight:      Height:       General.  Morbidly obese lady, in no acute distress. Pulmonary.  Lungs clear bilaterally, normal respiratory effort. CV.  Bradycardic, no JVD, rub or murmur. Abdomen.  Soft, nontender, nondistended, BS positive. CNS.  Alert and oriented .  No focal neurologic deficit. Extremities.  No edema, no cyanosis, pulses intact and symmetrical.  Left knee with cooling device. Psychiatry.  Judgment and insight appears normal.  Data Reviewed: Prior data reviewed.  Family Communication:   Disposition: Defer to primary team.  Time spent: 45 minutes  This record has been created using Systems analyst. Errors have been sought and corrected,but may not always be located. Such creation errors do not reflect on the standard of care.  Author: Lorella Nimrod, MD 09/13/2021 3:07 PM  For on call review [email or web address].

## 2021-09-13 NOTE — Evaluation (Signed)
Physical Therapy Evaluation Patient Details Name: Bonnie Holt MRN: [id number] DOB: 10-19-1945 Today's Date: 09/13/2021  History of Present Illness  Pt is a 76 yo F s/p L TKA 09/12/21. PMH includes HTN, CAD, Afib, DM, CKD, HLD, Bradycardia  Clinical Impression  Pt was pleasant and motivated to participate during the session and put forth good effort throughout. Mobility limitations this session due to awaiting cardiology results for activity tolerance parameters. PT educated pt on positioning of LLE to promote normal ROM and on HEP and demonstrated good ability to move LLE in supine. Mobility to be assessed in full next session later today once cleared by cardiology. Based on how well pt was moving LLE in supine anticipate pt will make good progress towards goals. Pt will benefit from HHPT upon discharge to safely address deficits listed in patient problem list for decreased caregiver assistance and eventual return to PLOF.       Recommendations for follow up therapy are one component of a multi-disciplinary discharge planning process, led by the attending physician.  Recommendations may be updated based on patient status, additional functional criteria and insurance authorization.  Follow Up Recommendations Home health PT      Assistance Recommended at Discharge Intermittent Supervision/Assistance  Patient can return home with the following  A little help with walking and/or transfers;A little help with bathing/dressing/bathroom;Assistance with cooking/housework;Assist for transportation;Help with stairs or ramp for entrance    Equipment Recommendations None recommended by PT  Recommendations for Other Services       Functional Status Assessment Patient has had a recent decline in their functional status and demonstrates the ability to make significant improvements in function in a reasonable and predictable amount of time.     Precautions / Restrictions Precautions Precautions:  Knee Precaution Booklet Issued: Yes (comment) Restrictions Weight Bearing Restrictions: Yes LLE Weight Bearing: Weight bearing as tolerated      Mobility  Bed Mobility               General bed mobility comments: not assessed; awaiting cardiology for activity tolerance parameters    Transfers                   General transfer comment: not assessed; awaiting cardiology for activity tolerance parameters    Ambulation/Gait               General Gait Details: not assessed; awaiting cardiology for activity tolerance parameters  Stairs            Wheelchair Mobility    Modified Rankin (Stroke Patients Only)       Balance Overall balance assessment:  (not assessed; awaiting cardiology for activity tolerance parameters)                                           Pertinent Vitals/Pain Pain Assessment Pain Assessment: No/denies pain    Home Living Family/patient expects to be discharged to:: Private residence Living Arrangements: Spouse/significant other (husband) Available Help at Discharge: Family;Available 24 hours/day Type of Home: House Home Access: Stairs to enter Entrance Stairs-Rails: Left Entrance Stairs-Number of Steps: 2   Home Layout: One level Home Equipment: Conservation officer, nature (2 wheels);Rollator (4 wheels);Cane - single point;Grab bars - tub/shower;BSC/3in1      Prior Function Prior Level of Function : Independent/Modified Independent             Mobility Comments:  Ind community ambulator using SPC, no hx of falls ADLs Comments: Ind w/ ADLs     Hand Dominance        Extremity/Trunk Assessment   Upper Extremity Assessment Upper Extremity Assessment: Overall WFL for tasks assessed    Lower Extremity Assessment Lower Extremity Assessment: LLE deficits/detail (unable to assess full mobility; awaiting cardiology for activity tolerance parameters) LLE Deficits / Details: Pt able to perform SLR        Communication   Communication: No difficulties  Cognition Arousal/Alertness: Awake/alert Behavior During Therapy: WFL for tasks assessed/performed Overall Cognitive Status: Within Functional Limits for tasks assessed                                          General Comments      Exercises Total Joint Exercises Ankle Circles/Pumps: Strengthening, 10 reps, Both Quad Sets: Strengthening, Both, 10 reps Gluteal Sets: Strengthening, Both, 10 reps Heel Slides: Strengthening, Left, 10 reps Hip ABduction/ADduction: Strengthening, Left, 10 reps Straight Leg Raises: Strengthening, 10 reps, Left Goniometric ROM: L knee AROM 4-80 degrees Other Exercises Other Exercises: HEP education per handout Other Exercises: Pt education on positioning of LLE   Assessment/Plan    PT Assessment Patient needs continued PT services  PT Problem List Decreased strength;Decreased range of motion;Decreased activity tolerance;Decreased balance;Decreased knowledge of use of DME;Decreased mobility       PT Treatment Interventions DME instruction;Therapeutic exercise;Balance training;Gait training;Stair training;Functional mobility training;Therapeutic activities;Patient/family education    PT Goals (Current goals can be found in the Care Plan section)  Acute Rehab PT Goals Patient Stated Goal: Pt would like to get back to dancing PT Goal Formulation: With patient Time For Goal Achievement: 09/26/21 Potential to Achieve Goals: Good    Frequency BID     Co-evaluation               AM-PAC PT "6 Clicks" Mobility  Outcome Measure Help needed turning from your back to your side while in a flat bed without using bedrails?: A Little Help needed moving from lying on your back to sitting on the side of a flat bed without using bedrails?: A Little Help needed moving to and from a bed to a chair (including a wheelchair)?: A Little Help needed standing up from a chair using your arms  (e.g., wheelchair or bedside chair)?: A Little Help needed to walk in hospital room?: A Little Help needed climbing 3-5 steps with a railing? : A Little 6 Click Score: 18    End of Session   Activity Tolerance: Patient tolerated treatment well Patient left: in bed;with call bell/phone within reach;with bed alarm set;with family/visitor present   PT Visit Diagnosis: Other abnormalities of gait and mobility (R26.89);Muscle weakness (generalized) (M62.81)    Time: 7793-9030 PT Time Calculation (min) (ACUTE ONLY): 23 min   Charges:              Turner Daniels, SPT  09/13/2021, 12:01 PM

## 2021-09-13 NOTE — Progress Notes (Addendum)
BiPAP in room. Patient and RN instructed to contact respiratory when patient is ready to go to sleep so RRT can place patient on machine.

## 2021-09-13 NOTE — Progress Notes (Signed)
Patient HR as low as 29 BMP with a normal BP. [name], NP aware. Will continue to monitor

## 2021-09-13 NOTE — Progress Notes (Signed)
Physical Therapy Treatment Patient Details Name: Bonnie Holt MRN: [id number] DOB: 1945-09-08 Today's Date: 09/13/2021   History of Present Illness Pt is a 76 year old female, s/p L TKA 09/12/21 . PMH significant for  hyperlipidemia, diabetes mellitus, atrial fibrillation on Eliquis, OSA on BiPAP, CAD, dCHF, breast cancer    PT Comments    Pt was pleasant and motivated to participate during the session and put forth good effort throughout. Per conversation with cardiologist Dr. Saralyn Pilar pt ok to participate with PT services to tolerance with no restrictions. Pt is able to complete sit to stands w/ CGA and min verbal cuing for sequencing and foot placement. Pt is able to ambulate 82f using RW w/ CGA with slow but steady gait and no LOB; min verbal cuing to maintain RW close. SpO2 WNL on room air and HR in mid-high 30s at rest and high as mid-high 40s after activity w/ no adverse symptoms reported/observed. Pt will benefit from HHPT upon discharge to safely address deficits listed in patient problem list for decreased caregiver assistance and eventual return to PLOF.    Recommendations for follow up therapy are one component of a multi-disciplinary discharge planning process, led by the attending physician.  Recommendations may be updated based on patient status, additional functional criteria and insurance authorization.  Follow Up Recommendations  Home health PT     Assistance Recommended at Discharge Intermittent Supervision/Assistance  Patient can return home with the following A little help with walking and/or transfers;A little help with bathing/dressing/bathroom;Assistance with cooking/housework;Assist for transportation;Help with stairs or ramp for entrance   Equipment Recommendations  None recommended by PT    Recommendations for Other Services       Precautions / Restrictions Precautions Precautions: Knee Precaution Booklet Issued: Yes (comment) Restrictions Weight Bearing  Restrictions: Yes LLE Weight Bearing: Weight bearing as tolerated     Mobility  Bed Mobility               General bed mobility comments: Pt in recliner at start/end of session    Transfers Overall transfer level: Needs assistance Equipment used: Rolling walker (2 wheels) Transfers: Sit to/from Stand Sit to Stand: Min guard           General transfer comment: min verbal cues for hand and foot placement    Ambulation/Gait Ambulation/Gait assistance: Min guard Gait Distance (Feet): 25 Feet Assistive device: Rolling walker (2 wheels) Gait Pattern/deviations: Decreased step length - right, Decreased step length - left, Step-to pattern Gait velocity: decreased     General Gait Details: slow steady gait w/ no LOB; min cuing for sequencing   Stairs             Wheelchair Mobility    Modified Rankin (Stroke Patients Only)       Balance Overall balance assessment: Needs assistance Sitting-balance support: Bilateral upper extremity supported, Feet supported Sitting balance-Leahy Scale: Good     Standing balance support: Bilateral upper extremity supported, During functional activity Standing balance-Leahy Scale: Fair                              Cognition Arousal/Alertness: Awake/alert Behavior During Therapy: WFL for tasks assessed/performed Overall Cognitive Status: Within Functional Limits for tasks assessed  Exercises Total Joint Exercises Heel Slides: AROM, Strengthening, Both, 10 reps Long Arc Quad: AROM, Strengthening, Both, 10 reps     General Comments General comments (skin integrity, edema, etc.): Some leakage noted at incision site; RN notified      Pertinent Vitals/Pain Pain Assessment Pain Assessment: 0-10 Pain Score: 2  Pain Location: R knee Pain Descriptors / Indicators: Aching Pain Intervention(s): Monitored during session, Premedicated before session,  Repositioned, Ice applied    Home Living Family/patient expects to be discharged to:: Private residence Living Arrangements: Spouse/significant other Available Help at Discharge: Family;Available 24 hours/day Type of Home: House Home Access: Stairs to enter Entrance Stairs-Rails: Left Entrance Stairs-Number of Steps: 2   Home Layout: One level Home Equipment: Conservation officer, nature (2 wheels);Rollator (4 wheels);Cane - single point;Grab bars - tub/shower;BSC/3in1;Other (comment) Management consultant)      Prior Function            PT Goals (current goals can now be found in the care plan section) Acute Rehab PT Goals Patient Stated Goal: Pt would like to get back to dancing PT Goal Formulation: With patient Time For Goal Achievement: 09/26/21 Potential to Achieve Goals: Good Progress towards PT goals: Progressing toward goals    Frequency    BID      PT Plan Current plan remains appropriate    Co-evaluation              AM-PAC PT "6 Clicks" Mobility   Outcome Measure  Help needed turning from your back to your side while in a flat bed without using bedrails?: A Little Help needed moving from lying on your back to sitting on the side of a flat bed without using bedrails?: A Little Help needed moving to and from a bed to a chair (including a wheelchair)?: A Little Help needed standing up from a chair using your arms (e.g., wheelchair or bedside chair)?: A Little Help needed to walk in hospital room?: A Little Help needed climbing 3-5 steps with a railing? : A Little 6 Click Score: 18    End of Session Equipment Utilized During Treatment: Gait belt Activity Tolerance: Patient tolerated treatment well Patient left: with call bell/phone within reach;in chair Nurse Communication: Mobility status PT Visit Diagnosis: Other abnormalities of gait and mobility (R26.89);Muscle weakness (generalized) (M62.81);Pain Pain - Right/Left: Left Pain - part of body: Knee     Time:  9030-0923 PT Time Calculation (min) (ACUTE ONLY): 24 min  Charges:  $Therapeutic Exercise: 8-22 mins                     Turner Daniels, SPT  09/13/2021, 3:08 PM

## 2021-09-13 NOTE — Evaluation (Signed)
Occupational Therapy Evaluation Patient Details Name: Bonnie Holt MRN: [id number] DOB: 06/01/1945 Today's Date: 09/13/2021   History of Present Illness Pt is a 76 year old female, underwent L knee replacement 6/30 and subsequently developed bradycardia with HR down to 20 and was transfered to the ICU. PMH significant for  hyperlipidemia, diabetes mellitus, atrial fibrillation on Eliquis, OSA on BiPAP, CAD, dCHF, breast cancer   Clinical Impression   Chart reviewed, RN cleared pt for participation in OT evaluation, requests transfer to bsc to attempt to void. Bed mobility completed with supervision, STS with CGA, short amb transfer to [facility] then bedside chair with CGA. Toileting completed with CGA for STS for peri care. HR up to 60 with activity. Pt is able to doff socks with supervision, donn with MAX A. Pt and husband provided education re: home safety, home set up, use of polar care, falls prevention, recommended DME use, ted hose management, safe ADL completion. Pt is left in bedside chair, NAD, all needs met. Rn aware of pt status. OT will follow acutely. Anticipate no OT needs following discharge.      Recommendations for follow up therapy are one component of a multi-disciplinary discharge planning process, led by the attending physician.  Recommendations may be updated based on patient status, additional functional criteria and insurance authorization.   Follow Up Recommendations  No OT follow up    Assistance Recommended at Discharge Intermittent Supervision/Assistance  Patient can return home with the following Assistance with cooking/housework;A little help with bathing/dressing/bathroom    Functional Status Assessment  Patient has had a recent decline in their functional status and demonstrates the ability to make significant improvements in function in a reasonable and predictable amount of time.  Equipment Recommendations  Other (comment) (pt has recommended equipment at home  available for use)    Recommendations for Other Services       Precautions / Restrictions Precautions Precautions: Knee Precaution Booklet Issued: Yes (comment) Restrictions Weight Bearing Restrictions: Yes LLE Weight Bearing: Weight bearing as tolerated      Mobility Bed Mobility Overal bed mobility: Needs Assistance Bed Mobility: Supine to Sit     Supine to sit: Supervision, HOB elevated          Transfers Overall transfer level: Needs assistance   Transfers: Sit to/from Stand Sit to Stand: Min guard           General transfer comment: with RW, vcs for technique      Balance Overall balance assessment: Needs assistance Sitting-balance support: Feet supported Sitting balance-Leahy Scale: Good Sitting balance - Comments: good dynamic sitting balance during ADL completion   Standing balance support: During functional activity, Bilateral upper extremity supported, Reliant on assistive device for balance Standing balance-Leahy Scale: Fair                             ADL either performed or assessed with clinical judgement   ADL Overall ADL's : Needs assistance/impaired Eating/Feeding: Set up;Sitting   Grooming: Set up;Wash/dry hands;Sitting               Lower Body Dressing: Maximal assistance;Supervision/safety Lower Body Dressing Details (indicate cue type and reason): supervision to doff, MAX A to donn socks at chair level Toilet Transfer: Min guard;Rolling walker (2 wheels);BSC/3in1 Toilet Transfer Details (indicate cue type and reason): short amb transfer Toileting- Clothing Manipulation and Hygiene: Min guard;Sit to/from stand       Functional mobility during ADLs:  Min guard;Rolling walker (2 wheels)       Vision Patient Visual Report: No change from baseline       Perception     Praxis      Pertinent Vitals/Pain Pain Assessment Pain Assessment: No/denies pain     Hand Dominance     Extremity/Trunk Assessment  Upper Extremity Assessment Upper Extremity Assessment: Overall WFL for tasks assessed   Lower Extremity Assessment Lower Extremity Assessment: LLE deficits/detail LLE Deficits / Details: s/p L knee replacement       Communication Communication Communication: No difficulties   Cognition Arousal/Alertness: Awake/alert Behavior During Therapy: WFL for tasks assessed/performed Overall Cognitive Status: Within Functional Limits for tasks assessed                                       General Comments  Pt HR up to 60 with mobility, HR is at 39 in chair at completion of eval; RN aware; all other vss throughout on RA    Exercises     Shoulder Instructions      Home Living Family/patient expects to be discharged to:: Private residence Living Arrangements: Spouse/significant other Available Help at Discharge: Family;Available 24 hours/day Type of Home: House Home Access: Stairs to enter [employer] of Steps: 2 Entrance Stairs-Rails: Left Home Layout: One level     Bathroom Shower/Tub: Occupational psychologist: Handicapped height Bathroom Accessibility: Yes   Home Equipment: Conservation officer, nature (2 wheels);Rollator (4 wheels);Cane - single point;Grab bars - tub/shower;BSC/3in1;Other (comment) (reacher)          Prior Functioning/Environment Prior Level of Function : Independent/Modified Independent             Mobility Comments: MOD I with SPC community ambulation ADLs Comments: MOD I-I in all ADL/IADL        OT Problem List: Decreased strength;Impaired balance (sitting and/or standing);Decreased knowledge of use of DME or AE;Decreased activity tolerance      OT Treatment/Interventions: Self-care/ADL training;Therapeutic exercise;Patient/family education;DME and/or AE instruction;Therapeutic activities;Balance training    OT Goals(Current goals can be found in the care plan section) Acute Rehab OT Goals Patient Stated Goal: go  home OT Goal Formulation: With patient/family Time For Goal Achievement: 09/27/21 Potential to Achieve Goals: Good ADL Goals Pt Will Perform Grooming: standing;with modified independence Pt Will Perform Lower Body Dressing: with modified independence;sit to/from stand Pt Will Transfer to Toilet: with modified independence;ambulating Pt Will Perform Toileting - Clothing Manipulation and hygiene: with modified independence;sit to/from stand  OT Frequency: Min 2X/week    Co-evaluation              AM-PAC OT "6 Clicks" Daily Activity     Outcome Measure Help from another person eating meals?: None Help from another person taking care of personal grooming?: None Help from another person toileting, which includes using toliet, bedpan, or urinal?: A Little Help from another person bathing (including washing, rinsing, drying)?: A Little Help from another person to put on and taking off regular upper body clothing?: None Help from another person to put on and taking off regular lower body clothing?: A Little 6 Click Score: 21   End of Session Equipment Utilized During Treatment: Gait belt;Rolling walker (2 wheels) Nurse Communication: Mobility status  Activity Tolerance: Patient tolerated treatment well Patient left: in chair;with call bell/phone within reach  OT Visit Diagnosis: Unsteadiness on feet (R26.81)  Time: 2683-4196 OT Time Calculation (min): 25 min Charges:  OT General Charges $OT Visit: 1 Visit OT Evaluation $OT Eval Moderate Complexity: 1 Mod  Shanon Payor, OTD OTR/L  09/13/21, 12:58 PM

## 2021-09-13 NOTE — Progress Notes (Signed)
Dr. Saralyn Pilar gave order that patient may get out of bed if she feels well enough to do so.

## 2021-09-13 NOTE — Progress Notes (Signed)
Patient has home BiPAP in room. I examined machine and found no tears or leaks in the machine or circuit. Machine appears clean and functional. I poured sterile water in water chamber. Patient is able to place machine on herself and start machine when she is ready to go to sleep. Patient and RN instructed to call respiratory with any issues.

## 2021-09-13 NOTE — Progress Notes (Signed)
Patient HR as low as 33 BMP with no signs of distress. Sharion Settler, NP aware & Atropine at bedside if needed

## 2021-09-13 NOTE — Hospital Course (Signed)
Taken from consult note.  Bonnie Holt is an 76 y.o. female with PMH of hypertension, hyperlipidemia, diabetes mellitus, atrial fibrillation on Eliquis, OSA on BiPAP, CAD, dCHF, breast cancer, underwent left knee replacement surgery today, developed bradycardia, will asked to consult on this patient.   Per Dr. Lubertha Basque, patient is scheduled for left knee replacement surgery by Dr. Marry Guan of Ortho, during the procedure, patient developed bradycardia with heart rate down to 20.  Patient was treated with atropine and 1 dose of bolus epinephrine, her heart rate improved to 40s.  When saw patient in PACU, patient is asymptomatic.  She denies any chest pain, shortness breath, lightheadedness or dizziness. Her HR is 38 when I saw pt. Patient states that she is constipated, no nausea, vomiting or abdominal pain.  No symptoms of UTI.  She has some mild pain in the surgical side currently.   Date reviewed, lab, image and vitals: Temperature normal, blood pressure 135/56, heart rate 39, RR 20, oxygen saturation 96% on room air.   EKG: I reviewed EKG independently.  Seems to be third-degree AV block, QTc 515, LAD, poor R wave progression, left bundle blockade. The repeated EKG showed junctional rhythm.  Cardiology was also consulted by orthopedic surgery.  7/1: Patient remained asymptomatic.  Cardiology saw the patient this morning and they are thinking she is having slow atrial fibrillation, patient was on amiodarone and metoprolol for A-fib at home, they are recommending keep holding as it was done before and continue to monitor.  No intervention needed at this time. BMP with worsening of renal function with BUN of 40 and creatinine of 2.52.  Baseline seems to be around 1.7.  CBC stable.

## 2021-09-13 NOTE — Progress Notes (Signed)
Subjective: 1 Day Post-Op Procedure(s) (LRB): COMPUTER ASSISTED TOTAL KNEE ARTHROPLASTY (Left) Patient reports pain as mild.   Patient is  doing better this AM.  The patient is in CCU with Bradycardia.  Stable BP.   Plan is to go Home after hospital stay. Negative for chest pain and shortness of breath Fever: no Gastrointestinal:Negative for nausea and vomiting  Objective: Vital signs in last 24 hours: Temp:  [97 F (36.1 C)-98.8 F (37.1 C)] 97.7 F (36.5 C) (07/01 0800) Pulse Rate:  [33-143] 41 (07/01 0800) Resp:  [11-59] 16 (07/01 0800) BP: (85-163)/(44-91) 129/74 (07/01 0800) SpO2:  [92 %-100 %] 95 % (07/01 0800) FiO2 (%):  [21 %] 21 % (07/01 0026) Weight:  [94.8 kg-101.1 kg] 101.1 kg (06/30 1841)  Intake/Output from previous day:  Intake/Output Summary (Last 24 hours) at 09/13/2021 0847 Last data filed at 09/13/2021 0800 Gross per 24 hour  Intake 3157.02 ml  Output 1250 ml  Net 1907.02 ml    Intake/Output this shift: Total I/O In: 400 [I.V.:300; IV Piggyback:100] Out: -   Labs: Recent Labs    09/12/21 1803  HGB 12.0   Recent Labs    09/12/21 1803  WBC 6.8  RBC 3.78*  HCT 37.2  PLT 165   Recent Labs    09/12/21 1803 09/13/21 0343  NA 136 135  K 5.2* 5.1  CL 109 111  CO2 21* 21*  BUN 35* 40*  CREATININE 2.19* 2.52*  GLUCOSE 234* 205*  CALCIUM 8.5* 8.4*   No results for input(s): "LABPT", "INR" in the last 72 hours.   EXAM General - Patient is Alert and Oriented Extremity - Neurovascular intact Sensation intact distally Dorsiflexion/Plantar flexion intact Compartment soft Dressing/Incision - clean, dry, With the Hemovac removed.  The tubing was intact on removal. Motor Function - intact, moving foot and toes well on exam. Able to do a SLR independently  Past Medical History:  Diagnosis Date   Arthritis    ASCUS with positive high risk HPV cervical    Basal cell carcinoma of skin    Bradycardia    Breast cancer, left ([place]) 11/19/2016    a.) stage IA (cT1b, cN0, cM0, G2, ER+, PR+, HER2-); OncotypeDx score = 6; s/p lumpectomy 12/16/2016 + adjuvant XRT (completed 03/20/2017) + 5 years endocrine (letrozole) therapy (completes 03/2022)   Chronic ischemic heart disease    CKD (chronic kidney disease), stage IV (HCC)    Coronary artery disease    a.) LHC/PCI in 2012 in [place]; BMS x 1 placed (unknown type/location)   Diastolic dysfunction 32/54/9826   a.) TTE 06/28/2017: EF 50%, mild LVH, mild LAE, triv PR, mild MR/TR, G1DD.   HLD (hyperlipidemia)    Hypertension    Left bundle branch block (LBBB)    Long term current use of anticoagulant    a.) apixaban   Long term current use of aromatase inhibitor    a.) letrozole; completes 5 years of therapy in 03/2022   OSA treated with BiPAP    Osteopenia    PAF (paroxysmal atrial fibrillation) (HCC)    a.) CHA2DS2-VASc = 5 (age x2, sex, HTN, T2DM); b.) rate/rhythm maintained on oral amiodarone + metoprolol succinate; chronically anticoagulated using standard dose apixaban   Personal history of radiation therapy    a.) for LEFT breast cancer; completed 03/2017   Tick fever    Type 2 diabetes mellitus treated with insulin ([place])    a.) has CGM in place    Assessment/Plan: 1 Day Post-Op Procedure(s) (  LRB): COMPUTER ASSISTED TOTAL KNEE ARTHROPLASTY (Left) Principal Problem:   Total knee replacement status Active Problems:   Primary cancer of upper outer quadrant of left female breast ([place])   CAD (coronary artery disease)   Type 2 diabetes mellitus with stage 4 chronic kidney disease (HCC)   Chronic kidney disease, stage 4 (severe) (HCC)   Paroxysmal atrial fibrillation (HCC)   OSA treated with BiPAP   HLD (hyperlipidemia)   Bradycardia   HTN (hypertension)   Chronic diastolic CHF (congestive heart failure) ([place])   Obesity with body mass index (BMI) of 30.0 to 39.9  Estimated body mass index is 40.77 kg/m as calculated from the following:   Height as of this encounter: $RemoveBeforeD'5\' 2"'[id number]$   (1.575 m).   Weight as of this encounter: 101.1 kg. Advance diet PT in the bed while in CCU.  Awaiting Cardiology consult.    DVT Prophylaxis - Foot Pumps, TED hose, and Eliquis Weight-Bearing as tolerated to left leg  Reche Dixon, PA-C Orthopaedic Surgery 09/13/2021, 8:47 AM

## 2021-09-13 NOTE — Consult Note (Signed)
[facility] Cardiology  CARDIOLOGY CONSULT NOTE  Patient ID: Bonnie Holt MRN: [id number] DOB/AGE: 03-21-1945 76 y.o.  Admit date: 09/12/2021 Referring Physician Blaine Hamper Primary Physician [facility] Primary Cardiologist [facility] Reason for Consultation bradycardia  HPI: 76 year old female referred for evaluation of bradycardia.  She has a history of atrial fibrillation on Eliquis, essential hypertension, and coronary artery disease, status post BMS 2012 in [place].  The patient underwent left total knee replacement surgery 09/12/2021, and during the procedure developed bradycardia with rates in the 20s, treated with atropine and epinephrine.  In recovery, ECG revealed junctional bradycardia versus slow atrial fibrillation at a rate of 45 bpm.  Patient was completely asymptomatic, denying chest pain, shortness of breath, lightheadedness, or dizziness.  She was observed overnight in the ICU where she remained asymptomatic.  Telemetry this morning reveals external bradycardia versus slow atrial fibrillation in the 30s.  Patient remains clinically and hemodynamically stable.  The patient was on amiodarone 200 mg daily and metoprolol succinate 50 mg daily for rate and rhythm control which is currently being held.  Review of systems complete and found to be negative unless listed above     Past Medical History:  Diagnosis Date   Arthritis    ASCUS with positive high risk HPV cervical    Basal cell carcinoma of skin    Bradycardia    Breast cancer, left ([place]) 11/19/2016   a.) stage IA (cT1b, cN0, cM0, G2, ER+, PR+, HER2-); OncotypeDx score = 6; s/p lumpectomy 12/16/2016 + adjuvant XRT (completed 03/20/2017) + 5 years endocrine (letrozole) therapy (completes 03/2022)   Chronic ischemic heart disease    CKD (chronic kidney disease), stage IV (HCC)    Coronary artery disease    a.) LHC/PCI in 2012 in [place]; BMS x 1 placed (unknown type/location)   Diastolic dysfunction 93/71/6967   a.) TTE  06/28/2017: EF 50%, mild LVH, mild LAE, triv PR, mild MR/TR, G1DD.   HLD (hyperlipidemia)    Hypertension    Left bundle branch block (LBBB)    Long term current use of anticoagulant    a.) apixaban   Long term current use of aromatase inhibitor    a.) letrozole; completes 5 years of therapy in 03/2022   OSA treated with BiPAP    Osteopenia    PAF (paroxysmal atrial fibrillation) (HCC)    a.) CHA2DS2-VASc = 5 (age x2, sex, HTN, T2DM); b.) rate/rhythm maintained on oral amiodarone + metoprolol succinate; chronically anticoagulated using standard dose apixaban   Personal history of radiation therapy    a.) for LEFT breast cancer; completed 03/2017   Tick fever    Type 2 diabetes mellitus treated with insulin ([place])    a.) has CGM in place    Past Surgical History:  Procedure Laterality Date   BREAST BIOPSY Left 11/19/2016   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   BREAST BIOPSY Left 11/19/2016   wing clip 2:30 4 cmfn Invasive mammary carcinoma   BREAST BIOPSY Left 11/19/2016   ribbon clip 3:00 6cmfn Invasive mammary carcinoma   BREAST BIOPSY Left 12/03/2016   LN biopsy, negative   BREAST BIOPSY Left 11/17/2017   affirm bx of calcs, x clip -FIBROSIS WITH DYSTROPHIC CALCIFICATIONS   BREAST LUMPECTOMY Left 12/16/2016   excision of all 3 sites, LN negative   CORONARY ANGIOPLASTY WITH STENT PLACEMENT Left 2012   [employer]   DIALYSIS/PERMA CATHETER INSERTION N/A 09/09/2017   Procedure: DIALYSIS/PERMA CATHETER INSERTION;  Surgeon: Algernon Huxley, MD;  Location: [facility]  INVASIVE CV LAB;  Service: Cardiovascular;  Laterality: N/A;   DIALYSIS/PERMA CATHETER REMOVAL N/A 10/04/2017   Procedure: DIALYSIS/PERMA CATHETER REMOVAL;  Surgeon: Algernon Huxley, MD;  Location: [facility] CV LAB;  Service: Cardiovascular;  Laterality: N/A;   ESOPHAGOGASTRODUODENOSCOPY (EGD) WITH PROPOFOL N/A 09/07/2017   Procedure: ESOPHAGOGASTRODUODENOSCOPY (EGD) WITH PROPOFOL;  Surgeon: Lucilla Lame, MD;  Location: [facility]  ENDOSCOPY;  Service: Endoscopy;  Laterality: N/A;   PARTIAL MASTECTOMY WITH NEEDLE LOCALIZATION Left 12/16/2016   Procedure: PARTIAL MASTECTOMY WITH NEEDLE LOCALIZATION;  Surgeon: Herbert Pun, MD;  Location: [facility] ORS;  Service: General;  Laterality: Left;   SENTINEL NODE BIOPSY Left 12/16/2016   Procedure: SENTINEL NODE BIOPSY;  Surgeon: Herbert Pun, MD;  Location: [facility] ORS;  Service: General;  Laterality: Left;    Medications Prior to Admission  Medication Sig Dispense Refill Last Dose   acetaminophen (TYLENOL) 325 MG tablet Place 2 tablets (650 mg total) into feeding tube every 6 (six) hours as needed for mild pain (or Fever >/= 101).   09/11/2021 at 1300   amiodarone (PACERONE) 100 MG tablet Take 100 mg by mouth daily.    09/11/2021   atorvastatin (LIPITOR) 80 MG tablet Take 1 tablet by mouth daily.   11 09/12/2021 at 0815   calcium citrate-vitamin D (CITRACAL+D) 315-200 MG-UNIT tablet Take 1 tablet by mouth daily.   Past Week   Cholecalciferol (VITAMIN D-3) 5000 units TABS Take 1 tablet by mouth daily.    Past Week   Coenzyme Q10 (COQ10 PO) Take 1 tablet by mouth daily.   Past Week   Continuous Blood Gluc Sensor (FREESTYLE LIBRE 14 DAY SENSOR) MISC Use 1 kit every 14 (fourteen) days   09/11/2021   glipiZIDE (GLUCOTROL) 5 MG tablet Take 5 mg by mouth daily.   09/11/2021   insulin detemir (LEVEMIR) 100 UNIT/ML FlexPen Inject 10 Units into the skin daily before breakfast.   09/11/2021 at 0800   letrozole (FEMARA) 2.5 MG tablet Take 1 tablet by mouth daily.   09/12/2021 at 0815   losartan (COZAAR) 25 MG tablet Take 25 mg by mouth daily.   09/11/2021 at 0800   metoprolol succinate (TOPROL-XL) 50 MG 24 hr tablet Take 50 mg by mouth daily.    09/11/2021 at 0800   multivitamin (RENA-VIT) TABS tablet Take 1 tablet by mouth at bedtime.  0 09/11/2021   torsemide (DEMADEX) 20 MG tablet Take 1 tablet by mouth daily as needed.  3 09/11/2021 at 0800   apixaban (ELIQUIS) 5 MG TABS tablet Take 5 mg  by mouth 2 (two) times daily.   09/08/2021   Flaxseed, Linseed, (FLAXSEED OIL) 1200 MG CAPS Take 1 capsule by mouth daily. (Patient not taking: Reported on 09/02/2021)      Semaglutide 14 MG TABS Take 1 tablet by mouth daily. (Patient not taking: Reported on 09/12/2021)   Not Taking   Social History   Socioeconomic History   Marital status: Married    Spouse name: [name]   Number of children: 0   Years of education: 14   Highest education level: High school graduate  Occupational History   Not on file  Tobacco Use   Smoking status: Never   Smokeless tobacco: Never  Vaping Use   Vaping Use: Never used  Substance and Sexual Activity   Alcohol use: Yes    Comment: socially -  2 per year   Drug use: No   Sexual activity: Not Currently  Other Topics Concern   Not on file  Social History Narrative   Not on file   Social Determinants of Health   Financial Resource Strain: Low Risk  (10/01/2017)   Overall Financial Resource Strain (CARDIA)    Difficulty of Paying Living Expenses: Not hard at all  Food Insecurity: No Food Insecurity (10/01/2017)   Hunger Vital Sign    Worried About Running Out of Food in the Last Year: Never true    Ran Out of Food in the Last Year: Never true  Transportation Needs: No Transportation Needs (10/01/2017)   PRAPARE - Hydrologist (Medical): No    Lack of Transportation (Non-Medical): No  Physical Activity: Inactive (10/01/2017)   Exercise Vital Sign    Days of Exercise per Week: 0 days    Minutes of Exercise per Session: 0 min  Stress: Stress Concern Present (10/01/2017)   [place]    Feeling of Stress : Rather much  Social Connections: Moderately Integrated (10/01/2017)   Social Connection and Isolation Panel [NHANES]    Frequency of Communication with Friends and Family: More than three times a week    Frequency of Social Gatherings with Friends and  Family: Twice a week    Attends Religious Services: More than 4 times per year    Active Member of [employer] or Organizations: No    Attends Archivist Meetings: Never    Marital Status: Married  Human resources officer Violence: Unknown (10/01/2017)   Humiliation, Afraid, Rape, and Kick questionnaire    Fear of Current or Ex-Partner: Patient refused    Emotionally Abused: Patient refused    Physically Abused: Patient refused    Sexually Abused: Patient refused    Family History  Problem Relation Age of Onset   Brain cancer Father    Diabetes Father    Hypertension Brother    Heart Problems Maternal Aunt    Diabetes Paternal Aunt    Heart Problems Maternal Grandmother    Dementia Paternal Grandmother    Heart Problems Brother    Hypertension Brother    Asthma Maternal Aunt    Arthritis Maternal Aunt    Diabetes Paternal Aunt    Cancer Paternal Uncle    Breast cancer Neg Hx       Review of systems complete and found to be negative unless listed above      PHYSICAL EXAM  General: Well developed, well nourished, in no acute distress HEENT:  Normocephalic and atramatic Neck:  No JVD.  Lungs: Clear bilaterally to auscultation and percussion. Heart: HRRR . Normal S1 and S2 without gallops or murmurs.  Abdomen: Bowel sounds are positive, abdomen soft and non-tender  Msk:  Back normal, normal gait. Normal strength and tone for age. Extremities: No clubbing, cyanosis or edema.   Neuro: Alert and oriented X 3. Psych:  Good affect, responds appropriately  Labs:   Lab Results  Component Value Date   WBC 6.8 09/12/2021   HGB 12.0 09/12/2021   HCT 37.2 09/12/2021   MCV 98.4 09/12/2021   PLT 165 09/12/2021    Recent Labs  Lab 09/13/21 0343  NA 135  K 5.1  CL 111  CO2 21*  BUN 40*  CREATININE 2.52*  CALCIUM 8.4*  GLUCOSE 205*   Lab Results  Component Value Date   CKTOTAL 98 08/29/2017   TROPONINI 0.28 (HH) 09/01/2017   No results found for: "CHOL" No results  found for: "HDL" No results found for: "LDLCALC" Lab Results  Component Value Date   TRIG 108 09/01/2017   No results found for: "CHOLHDL" No results found for: "LDLDIRECT"    Radiology: DG Knee Left Port  Result Date: 09/12/2021 CLINICAL DATA:  Left knee surgery EXAM: PORTABLE LEFT KNEE - 1-2 VIEW COMPARISON:  None Available. FINDINGS: Postsurgical changes from left total knee arthroplasty. Arthroplasty components are in their expected alignment. No periprosthetic fracture or evidence of other complication. Expected postoperative changes within the overlying soft tissues including surgical drains in the suprapatellar region. IMPRESSION: Satisfactory postoperative appearance status post left total knee arthroplasty. Electronically Signed   By: Davina Poke D.O.   On: 09/12/2021 17:02   DG Outside Films Extremity  Result Date: 09/12/2021 This examination belongs to an outside facility and is stored here for comparison purposes only.  Contact the originating outside institution for any associated report or interpretation.   EKG: Junctional bradycardia at 45 bpm  ASSESSMENT AND PLAN:   1.  Junctional bradycardia versus slow atrial fibrillation peri and postoperatively.  Patient appears clinically and hemodynamically stable.  No indication for temporary transvenous pacemaker. 2.  Coronary artery disease, status post BMS 2012, currently without chest pain 3.  Paroxysmal atrial fibrillation, on Eliquis for stroke prevention 4.  Chronic LBBB 5.  Essential hypertension, blood pressure low normal  Recommendations  1.  Agree with current therapy 2.  Continue to hold amiodarone and metoprolol succinate for now 3.  Continue Eliquis for stroke prevention 4.  May transfer patient to PCU for further monitoring of heart rate  Signed: Isaias Cowman MD,PhD, [facility] 09/13/2021, 9:03 AM

## 2021-09-14 ENCOUNTER — Encounter: Payer: Self-pay | Admitting: Orthopedic Surgery

## 2021-09-14 DIAGNOSIS — R001 Bradycardia, unspecified: Secondary | ICD-10-CM | POA: Diagnosis not present

## 2021-09-14 DIAGNOSIS — Z96652 Presence of left artificial knee joint: Secondary | ICD-10-CM | POA: Diagnosis not present

## 2021-09-14 LAB — GLUCOSE, CAPILLARY
Glucose-Capillary: 144 mg/dL — ABNORMAL HIGH (ref 70–99)
Glucose-Capillary: 136 mg/dL — ABNORMAL HIGH (ref 70–99)
Glucose-Capillary: 151 mg/dL — ABNORMAL HIGH (ref 70–99)
Glucose-Capillary: 132 mg/dL — ABNORMAL HIGH (ref 70–99)

## 2021-09-14 MED ORDER — METOPROLOL SUCCINATE ER 25 MG PO TB24
25.0000 mg | ORAL_TABLET | Freq: Every day | ORAL | Status: DC
  Administered 2021-09-14 – 2021-09-15 (×2): 25 mg via ORAL
  Filled 2021-09-14 (×2): qty 1

## 2021-09-14 NOTE — Progress Notes (Signed)
Physical Therapy Treatment Patient Details Name: Bonnie Holt MRN: [id number] DOB: 1946-01-25 Today's Date: 09/14/2021   History of Present Illness Pt is a 76 year old female, s/p L TKA 09/12/21 . PMH significant for  hyperlipidemia, diabetes mellitus, atrial fibrillation on Eliquis, OSA on BiPAP, CAD, dCHF, breast cancer    PT Comments    Pt was pleasant and motivated to participate during the session and put forth good effort throughout. Pt is able to complete sit to stands w/ CGA with verbal cuing for hand placement. Pt ambulated 35f using RW w/ CGA with slow but steady gait w/ no LOB. SpO2 WNL on room air and HR high 50s at rest and mid-high 60s with activity and no other adverse symptom response reported/observed. Pt will benefit from HHPT upon discharge to safely address deficits listed in patient problem list for decreased caregiver assistance and eventual return to PLOF.    Recommendations for follow up therapy are one component of a multi-disciplinary discharge planning process, led by the attending physician.  Recommendations may be updated based on patient status, additional functional criteria and insurance authorization.  Follow Up Recommendations  Home health PT     Assistance Recommended at Discharge Intermittent Supervision/Assistance  Patient can return home with the following A little help with walking and/or transfers;A little help with bathing/dressing/bathroom;Assistance with cooking/housework;Assist for transportation;Help with stairs or ramp for entrance   Equipment Recommendations  None recommended by PT    Recommendations for Other Services       Precautions / Restrictions Precautions Precautions: Knee Precaution Booklet Issued: Yes (comment) Restrictions Weight Bearing Restrictions: Yes LLE Weight Bearing: Weight bearing as tolerated     Mobility  Bed Mobility               General bed mobility comments: Pt in recliner at start/end of session     Transfers Overall transfer level: Needs assistance Equipment used: Rolling walker (2 wheels) Transfers: Sit to/from Stand Sit to Stand: Min guard           General transfer comment: verbal cues for hand placement    Ambulation/Gait Ambulation/Gait assistance: Min guard Gait Distance (Feet): 50 Feet Assistive device: Rolling walker (2 wheels) Gait Pattern/deviations: Decreased step length - right, Decreased step length - left, Step-through pattern Gait velocity: decreased     General Gait Details: slow steady gait w/ no LOB   Stairs             Wheelchair Mobility    Modified Rankin (Stroke Patients Only)       Balance Overall balance assessment: Needs assistance Sitting-balance support: Feet supported Sitting balance-Leahy Scale: Good     Standing balance support: Bilateral upper extremity supported, During functional activity Standing balance-Leahy Scale: Fair                              Cognition Arousal/Alertness: Awake/alert Behavior During Therapy: WFL for tasks assessed/performed Overall Cognitive Status: Within Functional Limits for tasks assessed                                          Exercises Total Joint Exercises Heel Slides: Strengthening, Both, 10 reps Long Arc Quad: Strengthening, Both, 10 reps Goniometric ROM: L knee AROM  1-90 degrees Marching in Standing: Seated, Strengthening, Both, 10 reps    General Comments General  comments (skin integrity, edema, etc.): Leakage noted at incision site; RN notified      Pertinent Vitals/Pain Pain Assessment Pain Assessment: 0-10 Pain Score: 2  Pain Location: R knee Pain Descriptors / Indicators: Aching Pain Intervention(s): Monitored during session, Repositioned    Home Living                          Prior Function            PT Goals (current goals can now be found in the care plan section) Progress towards PT goals: Progressing  toward goals    Frequency    BID      PT Plan Current plan remains appropriate    Co-evaluation              AM-PAC PT "6 Clicks" Mobility   Outcome Measure  Help needed turning from your back to your side while in a flat bed without using bedrails?: A Little Help needed moving from lying on your back to sitting on the side of a flat bed without using bedrails?: A Little Help needed moving to and from a bed to a chair (including a wheelchair)?: A Little Help needed standing up from a chair using your arms (e.g., wheelchair or bedside chair)?: A Little Help needed to walk in hospital room?: A Little Help needed climbing 3-5 steps with a railing? : A Little 6 Click Score: 18    End of Session Equipment Utilized During Treatment: Gait belt Activity Tolerance: Patient tolerated treatment well Patient left: with call bell/phone within reach;in chair Nurse Communication: Mobility status PT Visit Diagnosis: Other abnormalities of gait and mobility (R26.89);Muscle weakness (generalized) (M62.81);Pain Pain - Right/Left: Left Pain - part of body: Knee     Time: 9163-8466 PT Time Calculation (min) (ACUTE ONLY): 26 min  Charges:                        Turner Daniels, SPT  09/14/2021, 2:31 PM

## 2021-09-14 NOTE — Progress Notes (Signed)
[facility] Cardiology  SUBJECTIVE: Patient laying flat in bed, reports doing well, denies chest pain, shortness of breath, palpitations, heart racing, lightheadedness, dizziness   Vitals:   09/14/21 0400 09/14/21 0506 09/14/21 0600 09/14/21 0700  BP: (!) 139/53 (!) 141/56 (!) 122/54 (!) 131/56  Pulse: (!) 51 (!) 56 (!) 55 (!) 54  Resp: '13 16 13 13  '$ Temp:      TempSrc:      SpO2: 97% 98% 93% 97%  Weight:      Height:         Intake/Output Summary (Last 24 hours) at 09/14/2021 0835 Last data filed at 09/14/2021 0700 Gross per 24 hour  Intake 2474.26 ml  Output 4325 ml  Net -1850.74 ml      PHYSICAL EXAM  General: Well developed, well nourished, in no acute distress HEENT:  Normocephalic and atramatic Neck:  No JVD.  Lungs: Clear bilaterally to auscultation and percussion. Heart: HRRR . Normal S1 and S2 without gallops or murmurs.  Abdomen: Bowel sounds are positive, abdomen soft and non-tender  Msk:  Back normal, normal gait. Normal strength and tone for age. Extremities: No clubbing, cyanosis or edema.   Neuro: Alert and oriented X 3. Psych:  Good affect, responds appropriately   LABS: Basic Metabolic Panel: Recent Labs    09/12/21 1803 09/13/21 0343  NA 136 135  K 5.2* 5.1  CL 109 111  CO2 21* 21*  GLUCOSE 234* 205*  BUN 35* 40*  CREATININE 2.19* 2.52*  CALCIUM 8.5* 8.4*  MG  --  2.6*   Liver Function Tests: No results for input(s): "AST", "ALT", "ALKPHOS", "BILITOT", "PROT", "ALBUMIN" in the last 72 hours. No results for input(s): "LIPASE", "AMYLASE" in the last 72 hours. CBC: Recent Labs    09/12/21 1803  WBC 6.8  HGB 12.0  HCT 37.2  MCV 98.4  PLT 165   Cardiac Enzymes: No results for input(s): "CKTOTAL", "CKMB", "CKMBINDEX", "TROPONINI" in the last 72 hours. BNP: Invalid input(s): "POCBNP" D-Dimer: No results for input(s): "DDIMER" in the last 72 hours. Hemoglobin A1C: No results for input(s): "HGBA1C" in the last 72 hours. Fasting Lipid  Panel: No results for input(s): "CHOL", "HDL", "LDLCALC", "TRIG", "CHOLHDL", "LDLDIRECT" in the last 72 hours. Thyroid Function Tests: No results for input(s): "TSH", "T4TOTAL", "T3FREE", "THYROIDAB" in the last 72 hours.  Invalid input(s): "FREET3" Anemia Panel: No results for input(s): "VITAMINB12", "FOLATE", "FERRITIN", "TIBC", "IRON", "RETICCTPCT" in the last 72 hours.  DG Knee Left Port  Result Date: 09/12/2021 CLINICAL DATA:  Left knee surgery EXAM: PORTABLE LEFT KNEE - 1-2 VIEW COMPARISON:  None Available. FINDINGS: Postsurgical changes from left total knee arthroplasty. Arthroplasty components are in their expected alignment. No periprosthetic fracture or evidence of other complication. Expected postoperative changes within the overlying soft tissues including surgical drains in the suprapatellar region. IMPRESSION: Satisfactory postoperative appearance status post left total knee arthroplasty. Electronically Signed   By: Davina Poke D.O.   On: 09/12/2021 17:02   DG Outside Films Extremity  Result Date: 09/12/2021 This examination belongs to an outside facility and is stored here for comparison purposes only.  Contact the originating outside institution for any associated report or interpretation.    Echo   TELEMETRY: Sinus rhythm 61 bpm:  ASSESSMENT AND PLAN:  Principal Problem:   Total knee replacement status Active Problems:   Primary cancer of upper outer quadrant of left female breast ([place])   CAD (coronary artery disease)   Type 2 diabetes mellitus with stage 4  chronic kidney disease (HCC)   Chronic kidney disease, stage 4 (severe) (HCC)   Paroxysmal atrial fibrillation (HCC)   OSA treated with BiPAP   HLD (hyperlipidemia)   Bradycardia   HTN (hypertension)   Chronic diastolic CHF (congestive heart failure) (HCC)   Obesity with body mass index (BMI) of 30.0 to 39.9    1. Junctional bradycardia versus slow atrial fibrillation peri and postoperatively.  Patient  appeared clinically and hemodynamically stable.  Metoprolol succinate and amiodarone were held, currently back to baseline, in sinus rhythm at 61 bpm. 2.  Coronary artery disease, status post BMS 2012, currently without chest pain 3.  Paroxysmal atrial fibrillation, on Eliquis for stroke prevention 4.  Chronic LBBB 5.  Essential hypertension, blood pressure low normal   Recommendations   1.  Agree with current therapy 2.  Continue to hold amiodarone for now 3.  Resume metoprolol succinate 25 mg daily 4.  May transfer to PCU for further monitoring of heart rate  Isaias Cowman, MD, PhD, [facility] 09/14/2021 8:35 AM

## 2021-09-14 NOTE — Progress Notes (Signed)
Subjective: 2 Days Post-Op Procedure(s) (LRB): COMPUTER ASSISTED TOTAL KNEE ARTHROPLASTY (Left) Patient reports pain as mild.  Slept better. Patient is  doing better this AM.  The patient is in CCU with improving Bradycardia.  Stable BP.   Plan is to go Home after hospital stay. Negative for chest pain and shortness of breath Fever: no Gastrointestinal:Negative for nausea and vomiting  Objective: Vital signs in last 24 hours: Temp:  [97.6 F (36.4 C)-98 F (36.7 C)] 97.7 F (36.5 C) (07/02 0100) Pulse Rate:  [32-59] 54 (07/02 0700) Resp:  [12-22] 13 (07/02 0700) BP: (97-152)/(19-101) 131/56 (07/02 0700) SpO2:  [93 %-100 %] 97 % (07/02 0700)  Intake/Output from previous day:  Intake/Output Summary (Last 24 hours) at 09/14/2021 0825 Last data filed at 09/14/2021 0700 Gross per 24 hour  Intake 2474.26 ml  Output 4325 ml  Net -1850.74 ml    Intake/Output this shift: No intake/output data recorded.  Labs: Recent Labs    09/12/21 1803  HGB 12.0   Recent Labs    09/12/21 1803  WBC 6.8  RBC 3.78*  HCT 37.2  PLT 165   Recent Labs    09/12/21 1803 09/13/21 0343  NA 136 135  K 5.2* 5.1  CL 109 111  CO2 21* 21*  BUN 35* 40*  CREATININE 2.19* 2.52*  GLUCOSE 234* 205*  CALCIUM 8.5* 8.4*   No results for input(s): "LABPT", "INR" in the last 72 hours.   EXAM General - Patient is Alert and Oriented Extremity - Neurovascular intact Sensation intact distally Dorsiflexion/Plantar flexion intact Compartment soft Dressing/Incision - clean, dry, intact.   Motor Function - intact, moving foot and toes well on exam. Able to do a SLR independently.  Ambulated 25 feet with PT yesterday.  Past Medical History:  Diagnosis Date   Arthritis    ASCUS with positive high risk HPV cervical    Basal cell carcinoma of skin    Bradycardia    Breast cancer, left ([place]) 11/19/2016   a.) stage IA (cT1b, cN0, cM0, G2, ER+, PR+, HER2-); OncotypeDx score = 6; s/p lumpectomy 12/16/2016  + adjuvant XRT (completed 03/20/2017) + 5 years endocrine (letrozole) therapy (completes 03/2022)   Chronic ischemic heart disease    CKD (chronic kidney disease), stage IV (HCC)    Coronary artery disease    a.) LHC/PCI in 2012 in [place]; BMS x 1 placed (unknown type/location)   Diastolic dysfunction 40/98/1191   a.) TTE 06/28/2017: EF 50%, mild LVH, mild LAE, triv PR, mild MR/TR, G1DD.   HLD (hyperlipidemia)    Hypertension    Left bundle branch block (LBBB)    Long term current use of anticoagulant    a.) apixaban   Long term current use of aromatase inhibitor    a.) letrozole; completes 5 years of therapy in 03/2022   OSA treated with BiPAP    Osteopenia    PAF (paroxysmal atrial fibrillation) (HCC)    a.) CHA2DS2-VASc = 5 (age x2, sex, HTN, T2DM); b.) rate/rhythm maintained on oral amiodarone + metoprolol succinate; chronically anticoagulated using standard dose apixaban   Personal history of radiation therapy    a.) for LEFT breast cancer; completed 03/2017   Tick fever    Type 2 diabetes mellitus treated with insulin ([place])    a.) has CGM in place    Assessment/Plan: 2 Days Post-Op Procedure(s) (LRB): COMPUTER ASSISTED TOTAL KNEE ARTHROPLASTY (Left) Principal Problem:   Total knee replacement status Active Problems:   Primary cancer of upper  outer quadrant of left female breast ([place])   CAD (coronary artery disease)   Type 2 diabetes mellitus with stage 4 chronic kidney disease (HCC)   Chronic kidney disease, stage 4 (severe) (HCC)   Paroxysmal atrial fibrillation (HCC)   OSA treated with BiPAP   HLD (hyperlipidemia)   Bradycardia   HTN (hypertension)   Chronic diastolic CHF (congestive heart failure) ([place])   Obesity with body mass index (BMI) of 30.0 to 39.9  Estimated body mass index is 40.77 kg/m as calculated from the following:   Height as of this encounter: $RemoveBeforeD'5\' 2"'[id number]$  (1.575 m).   Weight as of this encounter: 101.1 kg. Advance diet  Appreciate Cardiology consult.   Bradycardia stable in the 50's. Plan to transfer to Ortho Floor today, when bed available.    DVT Prophylaxis - Foot Pumps, TED hose, and Eliquis Weight-Bearing as tolerated to left leg  Reche Dixon, PA-C Orthopaedic Surgery 09/14/2021, 8:25 AM

## 2021-09-14 NOTE — Progress Notes (Signed)
Progress Note   Patient: Bonnie Holt:[id number] DOB: Apr 09, 1945 DOA: 09/12/2021     1 DOS: the patient was seen and examined on 09/14/2021   Brief hospital course: Taken from consult note.  Bonnie Holt is an 76 y.o. female with PMH of hypertension, hyperlipidemia, diabetes mellitus, atrial fibrillation on Eliquis, OSA on BiPAP, CAD, dCHF, breast cancer, underwent left knee replacement surgery today, developed bradycardia, will asked to consult on this patient.   Per Dr. Lubertha Basque, patient is scheduled for left knee replacement surgery by Dr. Marry Guan of Ortho, during the procedure, patient developed bradycardia with heart rate down to 20.  Patient was treated with atropine and 1 dose of bolus epinephrine, her heart rate improved to 40s.  When saw patient in PACU, patient is asymptomatic.  She denies any chest pain, shortness breath, lightheadedness or dizziness. Her HR is 38 when I saw pt. Patient states that she is constipated, no nausea, vomiting or abdominal pain.  No symptoms of UTI.  She has some mild pain in the surgical side currently.   Date reviewed, lab, image and vitals: Temperature normal, blood pressure 135/56, heart rate 39, RR 20, oxygen saturation 96% on room air.   EKG: I reviewed EKG independently.  Seems to be third-degree AV block, QTc 515, LAD, poor R wave progression, left bundle blockade. The repeated EKG showed junctional rhythm.  Cardiology was also consulted by orthopedic surgery.  7/1: Patient remained asymptomatic.  Cardiology saw the patient this morning and they are thinking she is having slow atrial fibrillation, patient was on amiodarone and metoprolol for A-fib at home, they are recommending keep holding as it was done before and continue to monitor.  No intervention needed at this time. BMP with worsening of renal function with BUN of 40 and creatinine of 2.52.  Baseline seems to be around 1.7.  CBC stable.  7/2: Patient remained stable with improvement of  bradycardia.  Cardiology restarted home metoprolol with advised to keep holding amiodarone which can be addressed as an outpatient. Internal medicine will sign off today.   Assessment and Plan: * Total knee replacement status S/p of left knee replacement -Eliquis is restarted by Dr. 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 Per cardiology patient might be having slow A-fib. -Dr. Charlynn Grimes consulted Dr. Saralyn Pilar of cardiology -Cardiology restarted home metoprolol -Continue to hold amiodarone   Chronic diastolic CHF (congestive heart failure) ([place]) Denies shortness breath.  CHF seem to be compensated.  2D echo on 08/25/2017 showed EF of 50-55%. -Continue home torsemide  Paroxysmal atrial fibrillation (HCC) - Hold amiodarone and metoprolol -Eliquis restarted  CAD (coronary artery disease) No chest pain -Continue Lipitor  Primary cancer of upper outer quadrant of left female breast (HCC) - Continue letrozole  Type 2 diabetes mellitus with stage 4 chronic kidney disease (HCC) Recent A1c 5.6, well controlled.  Patient is taking semaglutide, glipizide and Levemir 5 units daily -Sliding scale insulin -Levemir 4 units daily  Chronic kidney disease, stage 4 (severe) (HCC) AKI with CKD stage IV, creatinine at 2.56 today. Recent baseline creatinine 1.70 on 09/02/2021. -Encourage p.o. hydration -Monitor renal function -Avoid nephrotoxins  HTN (hypertension) Blood pressure mildly elevated. -Restarting home metoprolol -Keep holding home Cozaar-can be restarted as needed -IV hydralazine as needed  OSA treated with BiPAP - BiPAP is ordered  HLD (hyperlipidemia) - Lipitor  Obesity with body mass index (  BMI) of 30.0 to 39.9 Estimated body mass index is 40.77 kg/m as calculated from the following:   Height as of this encounter: '5\' 2"'$  (1.575  m).   Weight as of this encounter: 101.1 kg.  -Diet and exercise.   -Encouraged to lose weight.   Subjective: Patient was seen and examined today.  No new complaints.  She wants to go home.  Physical Exam: Vitals:   09/14/21 1100 09/14/21 1200 09/14/21 1300 09/14/21 1600  BP: 112/72 129/70 (!) 134/55   Pulse: (!) 35 (!) 56 (!) 50   Resp: '18 19 13   '$ Temp: 97.9 F (36.6 C)   97.8 F (36.6 C)  TempSrc: Axillary   Oral  SpO2: (!) 89% 97% (!) 65%   Weight:      Height:       General.  Obese elderly lady, in no acute distress. Pulmonary.  Lungs clear bilaterally, normal respiratory effort. CV.  Regular rate and rhythm, no JVD, rub or murmur. Abdomen.  Soft, nontender, nondistended, BS positive. CNS.  Alert and oriented .  No focal neurologic deficit. Extremities.  No edema, no cyanosis, pulses intact and symmetrical. Psychiatry.  Judgment and insight appears normal.   Data Reviewed: Prior data reviewed.  Family Communication:   Disposition: Defer to primary team.  Time spent: 40 minutes  This record has been created using Systems analyst. Errors have been sought and corrected,but may not always be located. Such creation errors do not reflect on the standard of care.  Internal medicine will sign off today  Author: Lorella Nimrod, MD 09/14/2021 4:56 PM  For on call review [email or web address].

## 2021-09-15 ENCOUNTER — Encounter: Payer: Self-pay | Admitting: Orthopedic Surgery

## 2021-09-15 LAB — GLUCOSE, CAPILLARY
Glucose-Capillary: 142 mg/dL — ABNORMAL HIGH (ref 70–99)
Glucose-Capillary: 165 mg/dL — ABNORMAL HIGH (ref 70–99)

## 2021-09-15 MED ORDER — TRAMADOL HCL 50 MG PO TABS: 50.0000 mg | ORAL_TABLET | ORAL | 0 refills | Status: DC | PRN

## 2021-09-15 NOTE — Discharge Summary (Addendum)
Physician Discharge Summary  Patient ID: Bonnie Holt MRN: [id number] DOB/AGE: 21-Oct-1945 76 y.o.  Admit date: 09/12/2021 Discharge date: 09/15/2021  Admission Diagnoses:  Total knee replacement status [Z96.659]  Surgeries:Procedure(s):  Left total knee arthroplasty using computer-assisted navigation   SURGEON:  Marciano Sequin. M.D.   ANESTHESIA: spinal   ESTIMATED BLOOD LOSS: 50 mL   FLUIDS REPLACED: 1300 mL of crystalloid   TOURNIQUET TIME: 85 minutes   DRAINS: 2 medium Hemovac drains   SOFT TISSUE RELEASES: Anterior cruciate ligament, posterior cruciate ligament, deep medial collateral ligament, patellofemoral ligament   IMPLANTS UTILIZED: DePuy Attune size 5 posterior stabilized femoral component (cemented), size 5 rotating platform tibial component (cemented), 35 mm medialized dome patella (cemented), and a 6 mm stabilized rotating platform polyethylene insert.  Discharge Diagnoses: Patient Active Problem List   Diagnosis Date Noted   ASCUS with positive high risk HPV cervical 09/12/2021   Total knee replacement status 09/12/2021   OSA treated with BiPAP    HLD (hyperlipidemia)    Bradycardia    HTN (hypertension)    Chronic diastolic CHF (congestive heart failure) ([place])    Obesity with body mass index (BMI) of 30.0 to 39.9    Primary osteoarthritis of left knee 08/11/2021   Body mass index (BMI) 45.0-49.9, adult ([place]) 10/02/2019   Cervical atypism 10/02/2019   Cervical intraepithelial neoplasia grade 1 10/02/2019   Localized edema 10/02/2019   Osteopenia 10/02/2019   Chronic kidney disease, stage 4 (severe) ([place]) 05/30/2019   Dependence on other enabling machines and devices 12/12/2018   Use of cane as ambulatory aid 12/12/2018   Paroxysmal atrial fibrillation ([place]) 02/09/2018   Palliative care encounter    Acute respiratory failure with hypoxia ([place])    Encounter for intubation    CAD (coronary artery disease) 08/22/2017   Primary cancer of upper outer  quadrant of left female breast ([place]) 12/06/2016   Type 2 diabetes mellitus with stage 4 chronic kidney disease ([place]) 04/15/2015   Obstructive sleep apnea (adult) (pediatric) 04/15/2015   Pure hypercholesterolemia 04/15/2015    Past Medical History:  Diagnosis Date   Arthritis    ASCUS with positive high risk HPV cervical    Basal cell carcinoma of skin    Bradycardia    Breast cancer, left ([place]) 11/19/2016   a.) stage IA (cT1b, cN0, cM0, G2, ER+, PR+, HER2-); OncotypeDx score = 6; s/p lumpectomy 12/16/2016 + adjuvant XRT (completed 03/20/2017) + 5 years endocrine (letrozole) therapy (completes 03/2022)   Chronic ischemic heart disease    CKD (chronic kidney disease), stage IV (HCC)    Coronary artery disease    a.) LHC/PCI in 2012 in [place]; BMS x 1 placed (unknown type/location)   Diastolic dysfunction 00/37/0488   a.) TTE 06/28/2017: EF 50%, mild LVH, mild LAE, triv PR, mild MR/TR, G1DD.   HLD (hyperlipidemia)    Hypertension    Left bundle branch block (LBBB)    Long term current use of anticoagulant    a.) apixaban   Long term current use of aromatase inhibitor    a.) letrozole; completes 5 years of therapy in 03/2022   OSA treated with BiPAP    Osteopenia    PAF (paroxysmal atrial fibrillation) (HCC)    a.) CHA2DS2-VASc = 5 (age x2, sex, HTN, T2DM); b.) rate/rhythm maintained on oral amiodarone + metoprolol succinate; chronically anticoagulated using standard dose apixaban   Personal history of radiation therapy    a.) for LEFT breast cancer; completed 03/2017  Tick fever    Type 2 diabetes mellitus treated with insulin ([place])    a.) has CGM in place     Transfusion:    Consultants (if any): Treatment Team:  Lorella Nimrod, MD  Discharged Condition: Improved  Hospital Course: Bonnie Holt is an 76 y.o. female who was admitted 09/12/2021 with a diagnosis of left knee osteoarthritis and went to the operating room on 09/12/2021 and underwent left total knee arthoplasty. The  patient received perioperative antibiotics for prophylaxis (see below). Patient tolerated the procedure well ,but patient experienced significant bradycardia during closure. Medicine was consulted, as was cardiology, and patient was transferred to ICU and placed on telemetry. Patient improved and continued to work with physical therapy.     The patient received DVT prophylaxis in the form of early mobilization, TED hose and Eliquis and SCDs . A sacral pad had been placed and heels were elevated off of the bed with rolled towels in order to protect skin integrity. The surgical incision was healing well without signs of infection.   Physical therapy was initiated postoperatively once cleared by cardiology for transfers, gait training, and strengthening. Occupational therapy was initiated for activities of daily living and evaluation for assisted devices. Rehabilitation goals were reviewed in detail with the patient. The patient made steady progress with physical therapy and physical therapy recommended discharge to Home.   The patient achieved the preliminary goals of this hospitalization and was felt to be medically and orthopaedically appropriate for discharge.  She was given perioperative antibiotics:  Anti-infectives (From admission, onward)    Start     Dose/Rate Route Frequency Ordered Stop   09/12/21 1930  ceFAZolin (ANCEF) IVPB 2g/100 mL premix        2 g 200 mL/hr over 30 Minutes Intravenous Every 6 hours 09/12/21 1856 09/13/21 0239   09/12/21 0937  ceFAZolin (ANCEF) 2-4 GM/100ML-% IVPB       Note to Pharmacy: Olena Mater F: cabinet override      09/12/21 0937 09/12/21 1249   09/12/21 0600  ceFAZolin (ANCEF) IVPB 2g/100 mL premix        2 g 200 mL/hr over 30 Minutes Intravenous On call to O.R. 09/12/21 0129 09/12/21 1248     .  Recent vital signs:  Vitals:   09/15/21 0400 09/15/21 0726  BP: (!) 139/54 (!) 144/61  Pulse: 70 65  Resp: 18 (!) 22  Temp: 98.5 F (36.9 C) 98.3 F  (36.8 C)  SpO2: 94% 94%    Recent laboratory studies:  Recent Labs    09/12/21 1803 09/13/21 0343  WBC 6.8  --   HGB 12.0  --   HCT 37.2  --   PLT 165  --   K 5.2* 5.1  CL 109 111  CO2 21* 21*  BUN 35* 40*  CREATININE 2.19* 2.52*  GLUCOSE 234* 205*  CALCIUM 8.5* 8.4*    Diagnostic Studies: DG Knee Left Port  Result Date: 09/12/2021 CLINICAL DATA:  Left knee surgery EXAM: PORTABLE LEFT KNEE - 1-2 VIEW COMPARISON:  None Available. FINDINGS: Postsurgical changes from left total knee arthroplasty. Arthroplasty components are in their expected alignment. No periprosthetic fracture or evidence of other complication. Expected postoperative changes within the overlying soft tissues including surgical drains in the suprapatellar region. IMPRESSION: Satisfactory postoperative appearance status post left total knee arthroplasty. Electronically Signed   By: Davina Poke D.O.   On: 09/12/2021 17:02   DG Outside Films Extremity  Result Date: 09/12/2021 This examination  belongs to an outside facility and is stored here for comparison purposes only.  Contact the originating outside institution for any associated report or interpretation.   Discharge Medications:   Allergies as of 09/15/2021       Reactions   Levofloxacin Swelling   Swelling of joints Swelling of joints   Ace Inhibitors Cough   Warfarin Other (See Comments), Swelling   weakness        Medication List     STOP taking these medications    amiodarone 100 MG tablet Commonly known as: PACERONE       TAKE these medications    acetaminophen 325 MG tablet Commonly known as: TYLENOL Place 2 tablets (650 mg total) into feeding tube every 6 (six) hours as needed for mild pain (or Fever >/= 101).   apixaban 5 MG Tabs tablet Commonly known as: ELIQUIS Take 5 mg by mouth 2 (two) times daily.   atorvastatin 80 MG tablet Commonly known as: LIPITOR Take 1 tablet by mouth daily.   calcium citrate-vitamin D  315-200 MG-UNIT tablet Commonly known as: CITRACAL+D Take 1 tablet by mouth daily.   COQ10 PO Take 1 tablet by mouth daily.   Flaxseed Oil 1200 MG Caps Take 1 capsule by mouth daily.   FreeStyle Libre 14 Day Sensor Misc Use 1 kit every 14 (fourteen) days   glipiZIDE 5 MG tablet Commonly known as: GLUCOTROL Take 5 mg by mouth daily.   insulin detemir 100 UNIT/ML FlexPen Commonly known as: LEVEMIR Inject 10 Units into the skin daily before breakfast.   letrozole 2.5 MG tablet Commonly known as: FEMARA Take 1 tablet by mouth daily.   losartan 25 MG tablet Commonly known as: COZAAR Take 25 mg by mouth daily.   metoprolol succinate 50 MG 24 hr tablet Commonly known as: TOPROL-XL Take 50 mg by mouth daily.   multivitamin Tabs tablet Take 1 tablet by mouth at bedtime.   Semaglutide 14 MG Tabs Take 1 tablet by mouth daily.   torsemide 20 MG tablet Commonly known as: DEMADEX Take 1 tablet by mouth daily as needed.   traMADol 50 MG tablet Commonly known as: ULTRAM Take 1-2 tablets (50-100 mg total) by mouth every 4 (four) hours as needed for moderate pain.   Vitamin D-3 125 MCG (5000 UT) Tabs Take 1 tablet by mouth daily.               Durable Medical Equipment  (From admission, onward)           Start     Ordered   09/12/21 1857  DME Walker rolling  Once       Question:  Patient needs a walker to treat with the following condition  Answer:  Total knee replacement status   09/12/21 1856   09/12/21 1857  DME Bedside commode  Once       Question:  Patient needs a bedside commode to treat with the following condition  Answer:  Total knee replacement status   09/12/21 1856            Disposition: Home with home health PT     Follow-up Information     Fausto Skillern, PA-C Follow up on 09/26/2021.   Specialty: Orthopedic Surgery Why: at 9:45am Contact information: [place] [address] [phone]         Dereck Leep, MD Follow up on 10/23/2021.   Specialty: Orthopedic Surgery  Why: at 11:30am Contact information: [place] [address] [phone]         Isaias Cowman, MD. Call in 2 day(s).   Specialty: Cardiology Why: Call Wednesday to set up outpatient appointment. Contact information: [address] [facility] [place] [address] [phone]                  Tamala Julian, PA-C 09/15/2021, 11:42 AM

## 2021-09-15 NOTE — Progress Notes (Signed)
Physical Therapy Treatment Patient Details Name: Bonnie Holt MRN: [id number] DOB: [birth year] Today's Date: 09/15/2021   History of Present Illness Pt is a 76 year old female, s/p L TKA 09/12/21 . PMH significant for  hyperlipidemia, diabetes mellitus, atrial fibrillation on Eliquis, OSA on BiPAP, CAD, dCHF, breast cancer    PT Comments    Pt was pleasant and motivated to participate during the session and put forth good effort throughout. Spouse present during family training. Pt is able to complete sit to stands w/ CGA and min verbal cues for hand placement. Pt able to ambulate 52f using RW w/ CGA w/ slow steady gait but no LOB. Pt is able to perform 4 steps x2 using L rail w/ CGA following verbal cuing for sequencing; good concentric/eccentric control using RLE and no LOB throughout. Also was able to complete 1 step backwards using RW w/ CGA  following cues for sequencing to simulate car transfer. Pt will benefit from HHPT upon discharge to safely address deficits listed in patient problem list for decreased caregiver assistance and eventual return to PLOF.    Recommendations for follow up therapy are one component of a multi-disciplinary discharge planning process, led by the attending physician.  Recommendations may be updated based on patient status, additional functional criteria and insurance authorization.  Follow Up Recommendations  Home health PT     Assistance Recommended at Discharge Intermittent Supervision/Assistance  Patient can return home with the following A little help with walking and/or transfers;A little help with bathing/dressing/bathroom;Assistance with cooking/housework;Assist for transportation;Help with stairs or ramp for entrance   Equipment Recommendations  None recommended by PT    Recommendations for Other Services       Precautions / Restrictions Precautions Precautions: Knee Precaution Booklet Issued: Yes (comment) Restrictions Weight Bearing  Restrictions: Yes LLE Weight Bearing: Weight bearing as tolerated     Mobility  Bed Mobility               General bed mobility comments: Pt sitting EOB at start and ending in recliner    Transfers Overall transfer level: Needs assistance Equipment used: Rolling walker (2 wheels) Transfers: Sit to/from Stand Sit to Stand: Min guard           General transfer comment: min verbal cues for hand placement    Ambulation/Gait Ambulation/Gait assistance: Min guard Gait Distance (Feet): 80 Feet Assistive device: Rolling walker (2 wheels) Gait Pattern/deviations: Decreased step length - right, Decreased step length - left, Step-through pattern Gait velocity: decreased     General Gait Details: slow steady gait w/ no LOB   Stairs Stairs: Yes Stairs assistance: Min guard Stair Management: One rail Left, Step to pattern, Forwards, Backwards, With walker Number of Stairs: 4 x2; 1 x2 General stair comments: good stability w/ no LOB; verbal cues for sequencing   Wheelchair Mobility    Modified Rankin (Stroke Patients Only)       Balance   Sitting-balance support: Feet supported Sitting balance-Leahy Scale: Good     Standing balance support: Bilateral upper extremity supported, During functional activity Standing balance-Leahy Scale: Fair                              Cognition Arousal/Alertness: Awake/alert Behavior During Therapy: WFL for tasks assessed/performed Overall Cognitive Status: Within Functional Limits for tasks assessed  Exercises Total Joint Exercises Quad Sets: AROM, Left, 10 reps Long Arc Quad: AROM, Left, 10 reps Goniometric ROM: Lknee AROM 0-80 degrees Other Exercises Other Exercises: Pt education on stair sequencing Other Exercises: Pt education on sequencing for car transfer    General Comments        Pertinent Vitals/Pain Pain Assessment Pain Assessment:  0-10 Pain Score: 5  Pain Location: R knee Pain Descriptors / Indicators: Aching Pain Intervention(s): Monitored during session, Repositioned, RN gave pain meds during session, Ice applied    Home Living                          Prior Function            PT Goals (current goals can now be found in the care plan section) Progress towards PT goals: Progressing toward goals    Frequency    BID      PT Plan Current plan remains appropriate    Co-evaluation              AM-PAC PT "6 Clicks" Mobility   Outcome Measure  Help needed turning from your back to your side while in a flat bed without using bedrails?: A Little Help needed moving from lying on your back to sitting on the side of a flat bed without using bedrails?: A Little Help needed moving to and from a bed to a chair (including a wheelchair)?: A Little Help needed standing up from a chair using your arms (e.g., wheelchair or bedside chair)?: A Little Help needed to walk in hospital room?: A Little Help needed climbing 3-5 steps with a railing? : A Little 6 Click Score: 18    End of Session Equipment Utilized During Treatment: Gait belt Activity Tolerance: Patient tolerated treatment well Patient left: with call bell/phone within reach;in chair;with chair alarm set;with family/visitor present Nurse Communication: Mobility status PT Visit Diagnosis: Other abnormalities of gait and mobility (R26.89);Muscle weakness (generalized) (M62.81);Pain Pain - Right/Left: Left Pain - part of body: Knee     Time: 8185-6314 PT Time Calculation (min) (ACUTE ONLY): 43 min  Charges:                        Turner Daniels, SPT  09/15/2021, 11:06 AM

## 2021-09-15 NOTE — Plan of Care (Signed)
  Problem: Education: Goal: Ability to describe self-care measures that may prevent or decrease complications (Diabetes Survival Skills Education) will improve Outcome: Progressing   Problem: Fluid Volume: Goal: Ability to maintain a balanced intake and output will improve Outcome: Progressing   Problem: Health Behavior/Discharge Planning: Goal: Ability to identify and utilize available resources and services will improve Outcome: Progressing   Problem: Metabolic: Goal: Ability to maintain appropriate glucose levels will improve Outcome: Progressing

## 2021-09-15 NOTE — Progress Notes (Signed)
[facility] Cardiology  SUBJECTIVE:  - Feels well this morning and eager to go home.  - Telemetry shows NSR with HR in 60's, LBBB - No dizziness, lightheadedness, syncope, presyncope, shortness of breath, orthopnea. Using CPAP at night.    Vitals:   09/15/21 0000 09/15/21 0015 09/15/21 0400 09/15/21 0726  BP: (!) 149/132 (!) 149/57 (!) 139/54 (!) 144/61  Pulse: 74 70 70 65  Resp: '18 20 18 '$ (!) 22  Temp: 98.5 F (36.9 C)  98.5 F (36.9 C) 98.3 F (36.8 C)  TempSrc:   Oral Oral  SpO2: 98%  94% 94%  Weight:      Height:         Intake/Output Summary (Last 24 hours) at 09/15/2021 0850 Last data filed at 09/14/2021 1750 Gross per 24 hour  Intake 299.95 ml  Output 825 ml  Net -525.05 ml      PHYSICAL EXAM  General: Well developed, well nourished, in no acute distress HEENT:  Normocephalic and atramatic Neck:  No JVD.  Lungs: Clear bilaterally to auscultation and percussion. Heart: HRRR . Normal S1 and S2 without gallops or murmurs.  Abdomen: Bowel sounds are positive, abdomen soft and non-tender  Msk:  Back normal, normal gait. Normal strength and tone for age. Extremities: No clubbing, cyanosis or edema.   Neuro: Alert and oriented X 3. Psych:  Good affect, responds appropriately   LABS: Basic Metabolic Panel: Recent Labs    09/12/21 1803 09/13/21 0343  NA 136 135  K 5.2* 5.1  CL 109 111  CO2 21* 21*  GLUCOSE 234* 205*  BUN 35* 40*  CREATININE 2.19* 2.52*  CALCIUM 8.5* 8.4*  MG  --  2.6*    Liver Function Tests: No results for input(s): "AST", "ALT", "ALKPHOS", "BILITOT", "PROT", "ALBUMIN" in the last 72 hours. No results for input(s): "LIPASE", "AMYLASE" in the last 72 hours. CBC: Recent Labs    09/12/21 1803  WBC 6.8  HGB 12.0  HCT 37.2  MCV 98.4  PLT 165    Cardiac Enzymes: No results for input(s): "CKTOTAL", "CKMB", "CKMBINDEX", "TROPONINI" in the last 72 hours. BNP: Invalid input(s): "POCBNP" D-Dimer: No results for input(s): "DDIMER" in the last  72 hours. Hemoglobin A1C: No results for input(s): "HGBA1C" in the last 72 hours. Fasting Lipid Panel: No results for input(s): "CHOL", "HDL", "LDLCALC", "TRIG", "CHOLHDL", "LDLDIRECT" in the last 72 hours. Thyroid Function Tests: No results for input(s): "TSH", "T4TOTAL", "T3FREE", "THYROIDAB" in the last 72 hours.  Invalid input(s): "FREET3" Anemia Panel: No results for input(s): "VITAMINB12", "FOLATE", "FERRITIN", "TIBC", "IRON", "RETICCTPCT" in the last 72 hours.  No results found.   Echo   TELEMETRY: Sinus rhythm in 60's bpm:  ASSESSMENT AND PLAN:  Principal Problem:   Total knee replacement status Active Problems:   Primary cancer of upper outer quadrant of left female breast ([place])   CAD (coronary artery disease)   Type 2 diabetes mellitus with stage 4 chronic kidney disease (HCC)   Chronic kidney disease, stage 4 (severe) (HCC)   Paroxysmal atrial fibrillation (HCC)   OSA treated with BiPAP   HLD (hyperlipidemia)   Bradycardia   HTN (hypertension)   Chronic diastolic CHF (congestive heart failure) (HCC)   Obesity with body mass index (BMI) of 30.0 to 39.9    1. Junctional bradycardia versus slow atrial fibrillation peri and postoperatively.  Patient appeared clinically and hemodynamically stable.  Metoprolol succinate and amiodarone were held, currently back to baseline, in sinus rhythm in 60's.  2.  Coronary artery disease, status post BMS 2012, currently without chest pain 3.  Paroxysmal atrial fibrillation, on Eliquis for stroke prevention 4.  Chronic LBBB 5.  Essential hypertension, blood pressure normal   Recommendations   1.  Agree with current therapy. Continue eliquis.  2.  Hold amiodarone now and at discharge 3.  Continue metoprolol succinate 25 mg daily 4.  Patient appears stable for DC. Recommend patient contacting clinic Wednesday July 5th for close follow up and outpatient cardiac monitoring with Dr. Clayborn Bigness.   Andrez Grime,  MD 09/15/2021 8:50 AM

## 2021-09-15 NOTE — Progress Notes (Signed)
Honeycomb saturated over the middle 1/3 of the dressing. Honeycomb removed, Prevena Plus placed over incision. TED hose applied. Stressed unit must be plugged in when patient is not out of the home.

## 2021-09-15 NOTE — Progress Notes (Signed)
  Subjective: 3 Days Post-Op Procedure(s) (LRB): COMPUTER ASSISTED TOTAL KNEE ARTHROPLASTY (Left) Patient reports pain as well-controlled.   Patient is well, and has had no acute complaints or problems Plan is to go Home after hospital stay. Negative for chest pain and shortness of breath Fever: no Gastrointestinal: negative for nausea and vomiting.   Objective: Vital signs in last 24 hours: Temp:  [97.7 F (36.5 C)-98.5 F (36.9 C)] 98.5 F (36.9 C) (07/03 0400) Pulse Rate:  [35-74] 70 (07/03 0400) Resp:  [13-21] 18 (07/03 0400) BP: (112-166)/(45-132) 139/54 (07/03 0400) SpO2:  [65 %-100 %] 94 % (07/03 0400)  Intake/Output from previous day:  Intake/Output Summary (Last 24 hours) at 09/15/2021 0717 Last data filed at 09/14/2021 1750 Gross per 24 hour  Intake 299.95 ml  Output 825 ml  Net -525.05 ml    Intake/Output this shift: No intake/output data recorded.  Labs: Recent Labs    09/12/21 1803  HGB 12.0   Recent Labs    09/12/21 1803  WBC 6.8  RBC 3.78*  HCT 37.2  PLT 165   Recent Labs    09/12/21 1803 09/13/21 0343  NA 136 135  K 5.2* 5.1  CL 109 111  CO2 21* 21*  BUN 35* 40*  CREATININE 2.19* 2.52*  GLUCOSE 234* 205*  CALCIUM 8.5* 8.4*   No results for input(s): "LABPT", "INR" in the last 72 hours.   EXAM General - Patient is Alert, Appropriate, and Oriented Extremity - Neurovascular intact Dorsiflexion/Plantar flexion intact Compartment soft Dressing/Incision -bloody drainage noted over middle third of the dressing; after removal, mild drainage noted from middle of incision Motor Function - intact, moving foot and toes well on exam. Able to perform independent SLR.  Cardiovascular- Regular rate and rhythm, no murmurs/rubs/gallops Respiratory- Lungs clear to auscultation bilaterally    Assessment/Plan: 3 Days Post-Op Procedure(s) (LRB): COMPUTER ASSISTED TOTAL KNEE ARTHROPLASTY (Left) Principal Problem:   Total knee replacement  status Active Problems:   Primary cancer of upper outer quadrant of left female breast ([place])   CAD (coronary artery disease)   Type 2 diabetes mellitus with stage 4 chronic kidney disease (HCC)   Chronic kidney disease, stage 4 (severe) (HCC)   Paroxysmal atrial fibrillation (HCC)   OSA treated with BiPAP   HLD (hyperlipidemia)   Bradycardia   HTN (hypertension)   Chronic diastolic CHF (congestive heart failure) ([place])   Obesity with body mass index (BMI) of 30.0 to 39.9  Estimated body mass index is 40.77 kg/m as calculated from the following:   Height as of this encounter: _0  (1.575 m).   Weight as of this encounter: 101.1 kg. Advance diet Up with therapy  Continue with PT. D/c possible if goals met today.  Fresh honeycomb dressing applied.   DVT Prophylaxis - Eliquis, Xarelto, and SCDs Weight-Bearing as tolerated to left leg  Cassell Smiles, PA-C [facility] Orthopaedic Surgery 09/15/2021, 7:17 AM

## 2021-09-16 DIAGNOSIS — I480 Paroxysmal atrial fibrillation: Secondary | ICD-10-CM | POA: Diagnosis not present

## 2021-09-16 DIAGNOSIS — Z7984 Long term (current) use of oral hypoglycemic drugs: Secondary | ICD-10-CM | POA: Diagnosis not present

## 2021-09-16 DIAGNOSIS — G4733 Obstructive sleep apnea (adult) (pediatric): Secondary | ICD-10-CM | POA: Diagnosis not present

## 2021-09-16 DIAGNOSIS — Z853 Personal history of malignant neoplasm of breast: Secondary | ICD-10-CM | POA: Diagnosis not present

## 2021-09-16 DIAGNOSIS — E7800 Pure hypercholesterolemia, unspecified: Secondary | ICD-10-CM | POA: Diagnosis not present

## 2021-09-16 DIAGNOSIS — Z7901 Long term (current) use of anticoagulants: Secondary | ICD-10-CM | POA: Diagnosis not present

## 2021-09-16 DIAGNOSIS — Z471 Aftercare following joint replacement surgery: Secondary | ICD-10-CM | POA: Diagnosis not present

## 2021-09-16 DIAGNOSIS — I2510 Atherosclerotic heart disease of native coronary artery without angina pectoris: Secondary | ICD-10-CM | POA: Diagnosis not present

## 2021-09-16 DIAGNOSIS — N870 Mild cervical dysplasia: Secondary | ICD-10-CM | POA: Diagnosis not present

## 2021-09-16 DIAGNOSIS — I130 Hypertensive heart and chronic kidney disease with heart failure and stage 1 through stage 4 chronic kidney disease, or unspecified chronic kidney disease: Secondary | ICD-10-CM | POA: Diagnosis not present

## 2021-09-16 DIAGNOSIS — J9690 Respiratory failure, unspecified, unspecified whether with hypoxia or hypercapnia: Secondary | ICD-10-CM | POA: Diagnosis not present

## 2021-09-16 DIAGNOSIS — I5032 Chronic diastolic (congestive) heart failure: Secondary | ICD-10-CM | POA: Diagnosis not present

## 2021-09-16 DIAGNOSIS — R87810 Cervical high risk human papillomavirus (HPV) DNA test positive: Secondary | ICD-10-CM | POA: Diagnosis not present

## 2021-09-16 DIAGNOSIS — E1122 Type 2 diabetes mellitus with diabetic chronic kidney disease: Secondary | ICD-10-CM | POA: Diagnosis not present

## 2021-09-16 DIAGNOSIS — Z6839 Body mass index (BMI) 39.0-39.9, adult: Secondary | ICD-10-CM | POA: Diagnosis not present

## 2021-09-16 DIAGNOSIS — M8580 Other specified disorders of bone density and structure, unspecified site: Secondary | ICD-10-CM | POA: Diagnosis not present

## 2021-09-16 DIAGNOSIS — Z794 Long term (current) use of insulin: Secondary | ICD-10-CM | POA: Diagnosis not present

## 2021-09-16 DIAGNOSIS — N184 Chronic kidney disease, stage 4 (severe): Secondary | ICD-10-CM | POA: Diagnosis not present

## 2021-09-16 DIAGNOSIS — Z96652 Presence of left artificial knee joint: Secondary | ICD-10-CM | POA: Diagnosis not present

## 2021-09-16 DIAGNOSIS — I447 Left bundle-branch block, unspecified: Secondary | ICD-10-CM | POA: Diagnosis not present

## 2021-09-16 DIAGNOSIS — Z955 Presence of coronary angioplasty implant and graft: Secondary | ICD-10-CM | POA: Diagnosis not present

## 2021-09-18 DIAGNOSIS — Z96652 Presence of left artificial knee joint: Secondary | ICD-10-CM | POA: Diagnosis not present

## 2021-09-18 DIAGNOSIS — I2510 Atherosclerotic heart disease of native coronary artery without angina pectoris: Secondary | ICD-10-CM | POA: Diagnosis not present

## 2021-09-19 DIAGNOSIS — E1122 Type 2 diabetes mellitus with diabetic chronic kidney disease: Secondary | ICD-10-CM | POA: Diagnosis not present

## 2021-09-19 DIAGNOSIS — R001 Bradycardia, unspecified: Secondary | ICD-10-CM | POA: Diagnosis not present

## 2021-09-19 DIAGNOSIS — I10 Essential (primary) hypertension: Secondary | ICD-10-CM | POA: Diagnosis not present

## 2021-09-19 DIAGNOSIS — I25119 Atherosclerotic heart disease of native coronary artery with unspecified angina pectoris: Secondary | ICD-10-CM | POA: Diagnosis not present

## 2021-09-19 DIAGNOSIS — N184 Chronic kidney disease, stage 4 (severe): Secondary | ICD-10-CM | POA: Diagnosis not present

## 2021-09-19 DIAGNOSIS — I480 Paroxysmal atrial fibrillation: Secondary | ICD-10-CM | POA: Diagnosis not present

## 2021-09-23 DIAGNOSIS — G4733 Obstructive sleep apnea (adult) (pediatric): Secondary | ICD-10-CM | POA: Diagnosis not present

## 2021-09-23 DIAGNOSIS — I10 Essential (primary) hypertension: Secondary | ICD-10-CM | POA: Diagnosis not present

## 2021-09-26 DIAGNOSIS — M25662 Stiffness of left knee, not elsewhere classified: Secondary | ICD-10-CM | POA: Diagnosis not present

## 2021-09-26 DIAGNOSIS — M25562 Pain in left knee: Secondary | ICD-10-CM | POA: Diagnosis not present

## 2021-09-26 DIAGNOSIS — R29898 Other symptoms and signs involving the musculoskeletal system: Secondary | ICD-10-CM | POA: Diagnosis not present

## 2021-09-26 DIAGNOSIS — Z96652 Presence of left artificial knee joint: Secondary | ICD-10-CM | POA: Diagnosis not present

## 2021-09-29 DIAGNOSIS — M25562 Pain in left knee: Secondary | ICD-10-CM | POA: Diagnosis not present

## 2021-09-29 DIAGNOSIS — Z96652 Presence of left artificial knee joint: Secondary | ICD-10-CM | POA: Diagnosis not present

## 2021-10-01 DIAGNOSIS — M25562 Pain in left knee: Secondary | ICD-10-CM | POA: Diagnosis not present

## 2021-10-01 DIAGNOSIS — Z96652 Presence of left artificial knee joint: Secondary | ICD-10-CM | POA: Diagnosis not present

## 2021-10-03 DIAGNOSIS — Z96652 Presence of left artificial knee joint: Secondary | ICD-10-CM | POA: Diagnosis not present

## 2021-10-03 DIAGNOSIS — M25562 Pain in left knee: Secondary | ICD-10-CM | POA: Diagnosis not present

## 2021-10-05 DIAGNOSIS — Z471 Aftercare following joint replacement surgery: Secondary | ICD-10-CM | POA: Diagnosis not present

## 2021-10-07 DIAGNOSIS — N184 Chronic kidney disease, stage 4 (severe): Secondary | ICD-10-CM | POA: Diagnosis not present

## 2021-10-07 DIAGNOSIS — I10 Essential (primary) hypertension: Secondary | ICD-10-CM | POA: Diagnosis not present

## 2021-10-07 DIAGNOSIS — E1129 Type 2 diabetes mellitus with other diabetic kidney complication: Secondary | ICD-10-CM | POA: Diagnosis not present

## 2021-10-07 DIAGNOSIS — Z96652 Presence of left artificial knee joint: Secondary | ICD-10-CM | POA: Diagnosis not present

## 2021-10-09 DIAGNOSIS — Z96652 Presence of left artificial knee joint: Secondary | ICD-10-CM | POA: Diagnosis not present

## 2021-10-09 DIAGNOSIS — R001 Bradycardia, unspecified: Secondary | ICD-10-CM | POA: Diagnosis not present

## 2021-10-14 DIAGNOSIS — Z96652 Presence of left artificial knee joint: Secondary | ICD-10-CM | POA: Diagnosis not present

## 2021-10-16 ENCOUNTER — Ambulatory Visit: Payer: Self-pay

## 2021-10-16 DIAGNOSIS — N2581 Secondary hyperparathyroidism of renal origin: Secondary | ICD-10-CM | POA: Diagnosis not present

## 2021-10-16 DIAGNOSIS — N1832 Chronic kidney disease, stage 3b: Secondary | ICD-10-CM | POA: Diagnosis not present

## 2021-10-16 DIAGNOSIS — R600 Localized edema: Secondary | ICD-10-CM | POA: Diagnosis not present

## 2021-10-16 DIAGNOSIS — I10 Essential (primary) hypertension: Secondary | ICD-10-CM | POA: Diagnosis not present

## 2021-10-16 DIAGNOSIS — E1129 Type 2 diabetes mellitus with other diabetic kidney complication: Secondary | ICD-10-CM | POA: Diagnosis not present

## 2021-10-16 NOTE — Patient Instructions (Signed)
Visit Information  Thank you for taking time to visit with me today. Please don't hesitate to contact me if I can be of assistance to you.   Following are the goals we discussed today:   Goals Addressed             This Visit's Progress    RNCM: Effective Management of CKD       Care Coordination Interventions: BP Readings from Last 3 Encounters:  09/15/21 (!) 146/54  09/02/21 (!) 127/52  06/24/21 (!) 153/81    Lab Results  Component Value Date   CREATININE 2.52 (H) 09/13/2021   BUN 40 (H) 09/13/2021   NA 135 09/13/2021   K 5.1 09/13/2021   CL 111 09/13/2021   CO2 21 (L) 09/13/2021   GFR: 30 from labs from nephrology 10-07-2021  Assessed the Patient understanding of chronic kidney disease. 10-16-2021: The patient saw the nephrologist today and sees every 4 months. She is mindful of how her CKD can change and this is her biggest concern. She checks her blood sugars BID and usually it is around 100. Does have some episodes of hypoglycemia but knows how to treat. Last A1C was 5.6 in July 2023.    Evaluation of current treatment plan related to chronic kidney disease self management and patient's adherence to plan as established by provider. 10-17-2021: Sees pcp and nephrologist on a regular basis. Is compliant with the plan of care.     Provided education to patient re: stroke prevention, s/s of heart attack and stroke    Reviewed prescribed diet heart healthy/ADA/Renal. 10-16-2021: Mindful of dietary restrictions Reviewed medications with patient and discussed importance of compliance. 10-16-2021: Is compliant with medications. Does get assistance for Rybelsus. Also working with specialist to get Eliquis. Review of pharmacy resources to assist with medication assistance if needed.    Counseled on adverse effects of illicit drug and excessive alcohol use in patients with chronic kidney disease    Counseled on the importance of exercise goals with target of 150 minutes per week. 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 She is also doing exercises at home. Uses a cane when she is out and about. Denies any issues with falls or safety concerns   Advised patient, providing education and rationale, to monitor blood pressure daily and record, calling PCP for findings outside established parameters    Discussed complications of poorly controlled blood pressure such as heart disease, stroke, circulatory complications, vision complications, kidney impairment, sexual dysfunction    Reviewed scheduled/upcoming provider appointments including: Saw nephrologist today. Will see again in 4 months. Sees pcp on a regular basis. Knows to call for changes   Advised patient to discuss changes in kidney function, questions and concerns with provider    Discussed plans with patient for ongoing care management follow up and provided patient with direct contact information for care management team    Screening for signs and symptoms of depression related to chronic disease state      Discussed the impact of chronic kidney disease on daily life and mental health and acknowledged and normalized feelings of disempowerment, fear, and frustration    Assessed social determinant of health barriers    Provided education on kidney disease progression    Engage patient in early, proactive and ongoing discussion about goals of care and what matters most to them    Support coping and stress management by recognizing current strategies  and assist in developing new strategies such as mindfulness, journaling, relaxation techniques, problem-solving     Food Basics for Chronic Kidney Disease Chronic kidney disease (CKD) is when your kidneys are not working well. They cannot remove waste, fluids, and other substances from your blood the way they should. These substances can build up, which can worsen kidney damage and affect how your body works. Eating certain foods can  lead to a buildup of these substances. Changing your diet can help prevent more kidney damage. Diet changes may also delay dialysis or even keep you from needing it. What nutrients should I limit? Work with your treatment team and a food expert (dietitian) to make a meal plan that's right for you. Foods you can eat and foods you should limit or avoid will depend on the stage of your kidney disease and any other health conditions you have. The items listed below are not a complete list. Talk with your dietitian to learn what is best for you. Potassium Potassium affects how steadily your heart beats. Too much potassium in your blood can cause an irregular heartbeat or even a heart attack. You may need to limit foods that are high in potassium, such as: Liquid milk and soy milk. Salt substitutes that contain potassium. Fruits like bananas, apricots, nectarines, melon, prunes, raisins, kiwi, and oranges. Vegetables, such as potatoes, sweet potatoes, yams, tomatoes, leafy greens, beets, avocado, pumpkin, and winter squash. Beans, like lima beans. Nuts. Phosphorus Phosphorus is a mineral found in your bones. You need a balance between calcium and phosphorus to build and maintain healthy bones. Too much added phosphorus from the foods you eat can pull calcium from your bones. Losing calcium can make your bones weak and more likely to break. Too much phosphorus can also make your skin itch. You may need to limit foods that are high in phosphorus or that have added phosphorus, such as: Liquid milk and dairy products. Dark-colored sodas or soft drinks. Bran cereals and oatmeal. Protein  Protein helps you make and keep muscle. Protein also helps to repair your body's cells and tissues. One of the natural breakdown products of protein is a waste product called urea. When your kidneys are not working well, they cannot remove types of waste like urea. Reducing protein in your diet can help keep urea from  building up in your blood. Depending on your stage of kidney disease, you may need to eat smaller portions of foods that are high in protein. Sources of animal protein include: Meat (all types). Fish and seafood. Poultry. Eggs. Dairy. Other protein foods include: Beans and legumes. Nuts and nut butter. Soy, like tofu.  Sodium Salt (sodium) helps to keep a healthy balance of fluids in your body. Too much salt can increase your blood pressure, which can harm your heart and lungs. Extra salt can also cause your body to keep too much fluid, making your kidneys work harder. You may need to limit or avoid foods that are high in salt, such as: Salt seasonings. Soy and teriyaki sauce. Packaged, precooked, cured, or processed meats, such as sausages or meat loaves. Sardines. Salted crackers and snack foods. Fast food. Canned soups and most canned foods. Pickled foods. Vegetable juice. Boxed mixes or ready-to-eat boxed meals and side dishes. Bottled dressings, sauces, and marinades. Talk with your dietitian about how much potassium, phosphorus, protein, and salt you may have each day. Helpful tips Read food labels  Check the amount of salt in foods. Limit foods that have  salt or sodium listed among the first five ingredients. Try to eat low-salt foods. Check the ingredient list for added phosphorus or potassium. "Phos" in an ingredient is a sign that phosphorus has been added. Do not buy foods that are calcium-enriched or that have calcium added to them (are fortified). Buy canned vegetables and beans that say "no salt added" and rinse them before eating. Lifestyle Limit the amount of protein you eat from animal sources each day. Focus on protein from plant sources, like tofu and dried beans, peas, and lentils. Do not add salt to food when cooking or before eating. Do not eat star fruit. It can be toxic for people with kidney problems. Talk with your health care provider before taking any  vitamin or mineral supplements. If told by your health care provider, track how much liquid you drink so you can avoid drinking too much. You may need to include foods you eat that are made mostly from water, like gelatin, ice cream, soups, and juicy fruits and vegetables. If you have diabetes: If you have diabetes (diabetes mellitus) and CKD, you need to keep your blood sugar (glucose) in the target range recommended by your health care provider. Follow your diabetes management plan. This may include: Checking your blood glucose regularly. Taking medicines by mouth, or taking insulin, or both. Exercising for at least 30 minutes on 5 or more days each week, or as told by your health care provider. Tracking how many servings of carbohydrates you eat at each meal. Not using orange juice to treat low blood sugars. Instead, use apple juice, cranberry juice, or clear soda. You may be given guidelines on what foods and nutrients you may eat, and how much you can have each day. This depends on your stage of kidney disease and whether you have high blood pressure (hypertension). Follow the meal plan your dietitian gives you. To learn more: [employer] of Diabetes and Digestive and Kidney Diseases: [employer]: kidney.org Summary Chronic kidney disease (CKD) is when your kidneys are not working well. They cannot remove waste, fluids, and other substances from your blood the way they should. These substances can build up, which can worsen kidney damage and affect how your body works. Changing your diet can help prevent more kidney damage. Diet changes may also delay dialysis or even keep you from needing it. Diet changes are different for each person with CKD. Work with a dietitian to set up a meal plan that is right for you. This information is not intended to replace advice given to you by your health care provider. Make sure you discuss any questions you have with your  health care provider. Document Revised: 06/20/2021 Document Reviewed: 06/26/2019 Elsevier Patient Education  [place]. Chronic Kidney Disease, Adult Chronic kidney disease (CKD) occurs when the kidneys are slowly and permanently damaged over a long period of time. The kidneys are a pair of organs that do many important jobs in the body, including: Removing waste and extra fluid from the blood to make urine. Making hormones that maintain the amount of fluid in tissues and blood vessels. Maintaining the right amount of fluids and chemicals in the body. A small amount of kidney damage may not cause problems, but a large amount of damage may make it hard or impossible for the kidneys to work right. Steps must be taken to slow kidney damage or to stop it from getting worse. If steps are not taken, the kidneys may stop working  permanently (end-stage renal disease, or ESRD). Most of the time, CKD does not go away, but it can often be controlled. People who have CKD are usually able to live full lives. What are the causes? The most common causes of this condition are diabetes and high blood pressure (hypertension). Other causes include: Cardiovascular diseases. These affect the heart and blood vessels. Kidney diseases. These include: Glomerulonephritis, or inflammation of the tiny filters in the kidneys. Interstitial nephritis. This is swelling of the small tubes of the kidneys and of the surrounding structures. Polycystic kidney disease, in which clusters of fluid-filled sacs form within the kidneys. Renal vascular disease. This includes disorders that affect the arteries and veins of the kidneys. Diseases that affect the body's defense system (immune system). A problem with urine flow. This may be caused by: Kidney stones. Cancer. An enlarged prostate, in males. A kidney infection or urinary tract infection (UTI) that keeps coming back. Vasculitis. This is swelling or inflammation of the  blood vessels. What increases the risk? Your chances of having kidney disease increase with age. The following factors may make you more likely to develop this condition: A family history of kidney disease or kidney failure. Kidney failure means the kidneys can no longer work right. Certain genetic diseases. Taking medicines often that are damaging to the kidneys. Being around or being in contact with toxic substances. Obesity. A history of tobacco use. What are the signs or symptoms? Symptoms of this condition include: Feeling very tired (lethargic) and having less energy. Swelling, or edema, of the face, legs, ankles, or feet. Nausea or vomiting, or loss of appetite. Confusion or trouble concentrating. Muscle twitches and cramps, especially in the legs. Dry, itchy skin. A metallic taste in the mouth. Producing less urine, or producing more urine (especially at night). Shortness of breath. Trouble sleeping. CKD may also result in not having enough red blood cells or hemoglobin in the blood (anemia) or having weak bones (bone disease). Symptoms develop slowly and may not be obvious until the kidney damage becomes severe. It is possible to have kidney disease for years without having symptoms. How is this diagnosed? This condition may be diagnosed based on: Blood tests. Urine tests. Imaging tests, such as an ultrasound or a CT scan. A kidney biopsy. This involves removing a sample of kidney tissue to be looked at under a microscope. Results from these tests will help to determine how serious the CKD is. How is this treated? There is no cure for most cases of this condition, but treatment usually relieves symptoms and prevents or slows the worsening of the disease. Treatment may include: Diet changes, which may require you to avoid alcohol and foods that are high in salt, potassium, phosphorous, and protein. Medicines. These may: Lower blood pressure. Control blood sugar  (glucose). Relieve anemia. Relieve swelling. Protect your bones. Improve the balance of salts and minerals in your blood (electrolytes). Dialysis, which is a type of treatment that removes toxic waste from the body. It may be needed if you have kidney failure. Managing any other conditions that are causing your CKD or making it worse. Follow these instructions at home: Medicines Take over-the-counter and prescription medicines only as told by your health care provider. The amount of some medicines that you take may need to be changed. Do not take any new medicines unless approved by your health care provider. Many medicines can make kidney damage worse. Do not take any vitamin and mineral supplements unless approved by your health  care provider. Many nutritional supplements can make kidney damage worse. Lifestyle  Do not use any products that contain nicotine or tobacco, such as cigarettes, e-cigarettes, and chewing tobacco. If you need help quitting, ask your health care provider. If you drink alcohol: Limit how much you use to: 0-1 drink a day for women who are not pregnant. 0-2 drinks a day for men. Know how much alcohol is in your drink. In the [place] one drink equals one 12 oz bottle of beer (355 mL), one 5 oz glass of wine (148 mL), or one 1 oz glass of hard liquor (44 mL). Maintain a healthy weight. If you need help, ask your health care provider. General instructions  Follow instructions from your health care provider about eating or drinking restrictions, including any prescribed diet. Track your blood pressure at home. Report changes in your blood pressure as told. If you are being treated for diabetes, track your blood glucose levels as told. Start or continue an exercise plan. Exercise at least 30 minutes a day, 5 days a week. Keep your immunizations up to date as told. Keep all follow-up visits. This is important. Where to find more information American Association of  Kidney Patients: BombTimer.gl National Kidney Foundation: www.kidney.[place]: https://mathis.com/ Life Options: www.lifeoptions.org Kidney School: www.kidneyschool.org Contact a health care provider if: Your symptoms get worse. You develop new symptoms. Get help right away if: You develop symptoms of ESRD. These include: Headaches. Numbness in your hands or feet. Easy bruising. Frequent hiccups. Chest pain. Shortness of breath. Lack of menstrual periods, in women. You have a fever. You are producing less urine than usual. You have pain or bleeding when you urinate or when you have a bowel movement. These symptoms may represent a serious problem that is an emergency. Do not wait to see if the symptoms will go away. Get medical help right away. Call your local emergency services (911 in the [place] Do not drive yourself to the hospital. Summary Chronic kidney disease (CKD) occurs when the kidneys become damaged slowly over a long period of time. The most common causes of this condition are diabetes and high blood pressure (hypertension). There is no cure for most cases of CKD, but treatment usually relieves symptoms and prevents or slows the worsening of the disease. Treatment may include a combination of lifestyle changes, medicines, and dialysis. This information is not intended to replace advice given to you by your health care provider. Make sure you discuss any questions you have with your health care provider. Document Revised: 06/07/2019 Document Reviewed: 06/07/2019 Elsevier Patient Education  [place] next appointment is by telephone on 01-01-2022 at 130 pm  Please call the care guide team at [phone] if you need to cancel or reschedule your appointment.   If you are experiencing a Mental Health or [place] or need someone to talk to, please call the Suicide and Crisis Lifeline: 988 call the [place] National Suicide  Prevention Lifeline: 5812200212 or TTY: 219-876-2696 TTY (743)128-3795) to talk to a trained counselor call 1-800-273-TALK (toll free, 24 hour hotline)  Patient verbalizes understanding of instructions and care plan provided today and agrees to view in [place]. Active MyChart status and patient understanding of how to access instructions and care plan via MyChart confirmed with patient.     Telephone follow up appointment with care management team member scheduled for: 01-01-2022 at 130 pm  [place],  MSN, [place] [facility] Mobile: [phone]

## 2021-10-16 NOTE — Patient Outreach (Signed)
Care Coordination   Initial Visit Note   10/16/2021 Name: Bonnie Holt MRN: [id number] DOB: 1945-08-17  Bonnie Holt is a 76 y.o. year old female who sees Kirk Ruths, MD for primary care. I spoke with  [name] by phone today  What matters to the patients health and wellness today?  "My kidneys"    Goals Addressed             This Visit's Progress    RNCM: Effective Management of CKD       Care Coordination Interventions: BP Readings from Last 3 Encounters:  09/15/21 (!) 146/54  09/02/21 (!) 127/52  06/24/21 (!) 153/81    Lab Results  Component Value Date   CREATININE 2.52 (H) 09/13/2021   BUN 40 (H) 09/13/2021   NA 135 09/13/2021   K 5.1 09/13/2021   CL 111 09/13/2021   CO2 21 (L) 09/13/2021   GFR: 30 from labs from nephrology 10-07-2021  Assessed the Patient understanding of chronic kidney disease. 10-16-2021: The patient saw the nephrologist today and sees every 4 months. She is mindful of how her CKD can change and this is her biggest concern. She checks her blood sugars BID and usually it is around 100. Does have some episodes of hypoglycemia but knows how to treat. Last A1C was 5.6 in July 2023.    Evaluation of current treatment plan related to chronic kidney disease self management and patient's adherence to plan as established by provider. 10-17-2021: Sees pcp and nephrologist on a regular basis. Is compliant with the plan of care.     Provided education to patient re: stroke prevention, s/s of heart attack and stroke    Reviewed prescribed diet heart healthy/ADA/Renal. 10-16-2021: Mindful of dietary restrictions Reviewed medications with patient and discussed importance of compliance. 10-16-2021: Is compliant with medications. Does get assistance for Rybelsus. Also working with specialist to get Eliquis. Review of pharmacy resources to assist with medication assistance if needed.    Counseled on adverse effects of illicit drug and excessive alcohol use in  patients with chronic kidney disease    Counseled on the importance of exercise goals with target of 150 minutes per week. 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 She is also doing exercises at home. Uses a cane when she is out and about. Denies any issues with falls or safety concerns   Advised patient, providing education and rationale, to monitor blood pressure daily and record, calling PCP for findings outside established parameters    Discussed complications of poorly controlled blood pressure such as heart disease, stroke, circulatory complications, vision complications, kidney impairment, sexual dysfunction    Reviewed scheduled/upcoming provider appointments including: Saw nephrologist today. Will see again in 4 months. Sees pcp on a regular basis. Knows to call for changes   Advised patient to discuss changes in kidney function, questions and concerns with provider    Discussed plans with patient for ongoing care management follow up and provided patient with direct contact information for care management team    Screening for signs and symptoms of depression related to chronic disease state      Discussed the impact of chronic kidney disease on daily life and mental health and acknowledged and normalized feelings of disempowerment, fear, and frustration    Assessed social determinant of health barriers    Provided education on kidney disease progression    Engage patient in early,  proactive and ongoing discussion about goals of care and what matters most to them    Support coping and stress management by recognizing current strategies and assist in developing new strategies such as mindfulness, journaling, relaxation techniques, problem-solving     Food Basics for Chronic Kidney Disease Chronic kidney disease (CKD) is when your kidneys are not working well. They cannot remove waste, fluids, and other substances from your blood the  way they should. These substances can build up, which can worsen kidney damage and affect how your body works. Eating certain foods can lead to a buildup of these substances. Changing your diet can help prevent more kidney damage. Diet changes may also delay dialysis or even keep you from needing it. What nutrients should I limit? Work with your treatment team and a food expert (dietitian) to make a meal plan that's right for you. Foods you can eat and foods you should limit or avoid will depend on the stage of your kidney disease and any other health conditions you have. The items listed below are not a complete list. Talk with your dietitian to learn what is best for you. Potassium Potassium affects how steadily your heart beats. Too much potassium in your blood can cause an irregular heartbeat or even a heart attack. You may need to limit foods that are high in potassium, such as: Liquid milk and soy milk. Salt substitutes that contain potassium. Fruits like bananas, apricots, nectarines, melon, prunes, raisins, kiwi, and oranges. Vegetables, such as potatoes, sweet potatoes, yams, tomatoes, leafy greens, beets, avocado, pumpkin, and winter squash. Beans, like lima beans. Nuts. Phosphorus Phosphorus is a mineral found in your bones. You need a balance between calcium and phosphorus to build and maintain healthy bones. Too much added phosphorus from the foods you eat can pull calcium from your bones. Losing calcium can make your bones weak and more likely to break. Too much phosphorus can also make your skin itch. You may need to limit foods that are high in phosphorus or that have added phosphorus, such as: Liquid milk and dairy products. Dark-colored sodas or soft drinks. Bran cereals and oatmeal. Protein  Protein helps you make and keep muscle. Protein also helps to repair your body's cells and tissues. One of the natural breakdown products of protein is a waste product called urea. When  your kidneys are not working well, they cannot remove types of waste like urea. Reducing protein in your diet can help keep urea from building up in your blood. Depending on your stage of kidney disease, you may need to eat smaller portions of foods that are high in protein. Sources of animal protein include: Meat (all types). Fish and seafood. Poultry. Eggs. Dairy. Other protein foods include: Beans and legumes. Nuts and nut butter. Soy, like tofu.  Sodium Salt (sodium) helps to keep a healthy balance of fluids in your body. Too much salt can increase your blood pressure, which can harm your heart and lungs. Extra salt can also cause your body to keep too much fluid, making your kidneys work harder. You may need to limit or avoid foods that are high in salt, such as: Salt seasonings. Soy and teriyaki sauce. Packaged, precooked, cured, or processed meats, such as sausages or meat loaves. Sardines. Salted crackers and snack foods. Fast food. Canned soups and most canned foods. Pickled foods. Vegetable juice. Boxed mixes or ready-to-eat boxed meals and side dishes. Bottled dressings, sauces, and marinades. Talk with your dietitian about how much potassium,  phosphorus, protein, and salt you may have each day. Helpful tips Read food labels  Check the amount of salt in foods. Limit foods that have salt or sodium listed among the first five ingredients. Try to eat low-salt foods. Check the ingredient list for added phosphorus or potassium. "Phos" in an ingredient is a sign that phosphorus has been added. Do not buy foods that are calcium-enriched or that have calcium added to them (are fortified). Buy canned vegetables and beans that say "no salt added" and rinse them before eating. Lifestyle Limit the amount of protein you eat from animal sources each day. Focus on protein from plant sources, like tofu and dried beans, peas, and lentils. Do not add salt to food when cooking or before  eating. Do not eat star fruit. It can be toxic for people with kidney problems. Talk with your health care provider before taking any vitamin or mineral supplements. If told by your health care provider, track how much liquid you drink so you can avoid drinking too much. You may need to include foods you eat that are made mostly from water, like gelatin, ice cream, soups, and juicy fruits and vegetables. If you have diabetes: If you have diabetes (diabetes mellitus) and CKD, you need to keep your blood sugar (glucose) in the target range recommended by your health care provider. Follow your diabetes management plan. This may include: Checking your blood glucose regularly. Taking medicines by mouth, or taking insulin, or both. Exercising for at least 30 minutes on 5 or more days each week, or as told by your health care provider. Tracking how many servings of carbohydrates you eat at each meal. Not using orange juice to treat low blood sugars. Instead, use apple juice, cranberry juice, or clear soda. You may be given guidelines on what foods and nutrients you may eat, and how much you can have each day. This depends on your stage of kidney disease and whether you have high blood pressure (hypertension). Follow the meal plan your dietitian gives you. To learn more: [employer] of Diabetes and Digestive and Kidney Diseases: [employer]: kidney.org Summary Chronic kidney disease (CKD) is when your kidneys are not working well. They cannot remove waste, fluids, and other substances from your blood the way they should. These substances can build up, which can worsen kidney damage and affect how your body works. Changing your diet can help prevent more kidney damage. Diet changes may also delay dialysis or even keep you from needing it. Diet changes are different for each person with CKD. Work with a dietitian to set up a meal plan that is right for you. This  information is not intended to replace advice given to you by your health care provider. Make sure you discuss any questions you have with your health care provider. Document Revised: 06/20/2021 Document Reviewed: 06/26/2019 Elsevier Patient Education  [place]. Chronic Kidney Disease, Adult Chronic kidney disease (CKD) occurs when the kidneys are slowly and permanently damaged over a long period of time. The kidneys are a pair of organs that do many important jobs in the body, including: Removing waste and extra fluid from the blood to make urine. Making hormones that maintain the amount of fluid in tissues and blood vessels. Maintaining the right amount of fluids and chemicals in the body. A small amount of kidney damage may not cause problems, but a large amount of damage may make it hard or impossible for the kidneys to work  right. Steps must be taken to slow kidney damage or to stop it from getting worse. If steps are not taken, the kidneys may stop working permanently (end-stage renal disease, or ESRD). Most of the time, CKD does not go away, but it can often be controlled. People who have CKD are usually able to live full lives. What are the causes? The most common causes of this condition are diabetes and high blood pressure (hypertension). Other causes include: Cardiovascular diseases. These affect the heart and blood vessels. Kidney diseases. These include: Glomerulonephritis, or inflammation of the tiny filters in the kidneys. Interstitial nephritis. This is swelling of the small tubes of the kidneys and of the surrounding structures. Polycystic kidney disease, in which clusters of fluid-filled sacs form within the kidneys. Renal vascular disease. This includes disorders that affect the arteries and veins of the kidneys. Diseases that affect the body's defense system (immune system). A problem with urine flow. This may be caused by: Kidney stones. Cancer. An enlarged  prostate, in males. A kidney infection or urinary tract infection (UTI) that keeps coming back. Vasculitis. This is swelling or inflammation of the blood vessels. What increases the risk? Your chances of having kidney disease increase with age. The following factors may make you more likely to develop this condition: A family history of kidney disease or kidney failure. Kidney failure means the kidneys can no longer work right. Certain genetic diseases. Taking medicines often that are damaging to the kidneys. Being around or being in contact with toxic substances. Obesity. A history of tobacco use. What are the signs or symptoms? Symptoms of this condition include: Feeling very tired (lethargic) and having less energy. Swelling, or edema, of the face, legs, ankles, or feet. Nausea or vomiting, or loss of appetite. Confusion or trouble concentrating. Muscle twitches and cramps, especially in the legs. Dry, itchy skin. A metallic taste in the mouth. Producing less urine, or producing more urine (especially at night). Shortness of breath. Trouble sleeping. CKD may also result in not having enough red blood cells or hemoglobin in the blood (anemia) or having weak bones (bone disease). Symptoms develop slowly and may not be obvious until the kidney damage becomes severe. It is possible to have kidney disease for years without having symptoms. How is this diagnosed? This condition may be diagnosed based on: Blood tests. Urine tests. Imaging tests, such as an ultrasound or a CT scan. A kidney biopsy. This involves removing a sample of kidney tissue to be looked at under a microscope. Results from these tests will help to determine how serious the CKD is. How is this treated? There is no cure for most cases of this condition, but treatment usually relieves symptoms and prevents or slows the worsening of the disease. Treatment may include: Diet changes, which may require you to avoid  alcohol and foods that are high in salt, potassium, phosphorous, and protein. Medicines. These may: Lower blood pressure. Control blood sugar (glucose). Relieve anemia. Relieve swelling. Protect your bones. Improve the balance of salts and minerals in your blood (electrolytes). Dialysis, which is a type of treatment that removes toxic waste from the body. It may be needed if you have kidney failure. Managing any other conditions that are causing your CKD or making it worse. Follow these instructions at home: Medicines Take over-the-counter and prescription medicines only as told by your health care provider. The amount of some medicines that you take may need to be changed. Do not take any new medicines unless  approved by your health care provider. Many medicines can make kidney damage worse. Do not take any vitamin and mineral supplements unless approved by your health care provider. Many nutritional supplements can make kidney damage worse. Lifestyle  Do not use any products that contain nicotine or tobacco, such as cigarettes, e-cigarettes, and chewing tobacco. If you need help quitting, ask your health care provider. If you drink alcohol: Limit how much you use to: 0-1 drink a day for women who are not pregnant. 0-2 drinks a day for men. Know how much alcohol is in your drink. In the [place] one drink equals one 12 oz bottle of beer (355 mL), one 5 oz glass of wine (148 mL), or one 1 oz glass of hard liquor (44 mL). Maintain a healthy weight. If you need help, ask your health care provider. General instructions  Follow instructions from your health care provider about eating or drinking restrictions, including any prescribed diet. Track your blood pressure at home. Report changes in your blood pressure as told. If you are being treated for diabetes, track your blood glucose levels as told. Start or continue an exercise plan. Exercise at least 30 minutes a day, 5 days a week. Keep  your immunizations up to date as told. Keep all follow-up visits. This is important. Where to find more information American Association of Kidney Patients: BombTimer.gl National Kidney Foundation: www.kidney.[place]: https://mathis.com/ Life Options: www.lifeoptions.org Kidney School: www.kidneyschool.org Contact a health care provider if: Your symptoms get worse. You develop new symptoms. Get help right away if: You develop symptoms of ESRD. These include: Headaches. Numbness in your hands or feet. Easy bruising. Frequent hiccups. Chest pain. Shortness of breath. Lack of menstrual periods, in women. You have a fever. You are producing less urine than usual. You have pain or bleeding when you urinate or when you have a bowel movement. These symptoms may represent a serious problem that is an emergency. Do not wait to see if the symptoms will go away. Get medical help right away. Call your local emergency services (911 in the [place] Do not drive yourself to the hospital. Summary Chronic kidney disease (CKD) occurs when the kidneys become damaged slowly over a long period of time. The most common causes of this condition are diabetes and high blood pressure (hypertension). There is no cure for most cases of CKD, but treatment usually relieves symptoms and prevents or slows the worsening of the disease. Treatment may include a combination of lifestyle changes, medicines, and dialysis. This information is not intended to replace advice given to you by your health care provider. Make sure you discuss any questions you have with your health care provider. Document Revised: 06/07/2019 Document Reviewed: 06/07/2019 Elsevier Patient Education  [place] assessments and interventions completed:  Yes  SDOH Interventions Today    Flowsheet Row Most Recent Value  SDOH Interventions   Food Insecurity Interventions Intervention Not Indicated   Financial Strain Interventions Intervention Not Indicated  Housing Interventions Intervention Not Indicated  Physical Activity Interventions Other (Comments)  [rehab 2 to 3 times a week post left knee replacement]  Stress Interventions Intervention Not Indicated  Social Connections Interventions Intervention Not Indicated  Transportation Interventions Intervention Not Indicated        Care Coordination Interventions Activated:  Yes  Care Coordination Interventions:  Yes, provided   Follow up plan: Follow up call scheduled for 01-01-2022 at 130 pm  Encounter Outcome:  Pt. Visit Completed   Noreene Larsson RN, MSN, [facility] Mobile: [phone]

## 2021-10-17 DIAGNOSIS — M25562 Pain in left knee: Secondary | ICD-10-CM | POA: Diagnosis not present

## 2021-10-17 DIAGNOSIS — Z96652 Presence of left artificial knee joint: Secondary | ICD-10-CM | POA: Diagnosis not present

## 2021-10-21 DIAGNOSIS — Z96652 Presence of left artificial knee joint: Secondary | ICD-10-CM | POA: Diagnosis not present

## 2021-10-21 DIAGNOSIS — M25562 Pain in left knee: Secondary | ICD-10-CM | POA: Diagnosis not present

## 2021-10-22 ENCOUNTER — Ambulatory Visit: Payer: Self-pay

## 2021-10-22 DIAGNOSIS — I25119 Atherosclerotic heart disease of native coronary artery with unspecified angina pectoris: Secondary | ICD-10-CM | POA: Diagnosis not present

## 2021-10-22 DIAGNOSIS — E1122 Type 2 diabetes mellitus with diabetic chronic kidney disease: Secondary | ICD-10-CM | POA: Diagnosis not present

## 2021-10-22 DIAGNOSIS — I10 Essential (primary) hypertension: Secondary | ICD-10-CM | POA: Diagnosis not present

## 2021-10-22 DIAGNOSIS — N184 Chronic kidney disease, stage 4 (severe): Secondary | ICD-10-CM | POA: Diagnosis not present

## 2021-10-22 DIAGNOSIS — I480 Paroxysmal atrial fibrillation: Secondary | ICD-10-CM | POA: Diagnosis not present

## 2021-10-22 NOTE — Patient Outreach (Signed)
Care Coordination   Follow Up Visit Note   10/22/2021 Name: Bonnie Holt MRN: [id number] DOB: [birth year]  Bonnie Holt is a 76 y.o. year old female who sees Bonnie Ruths, MD for primary care. I spoke with  Bonnie Holt by phone today  What matters to the patients health and wellness today?  Needing an appointment change and to let the [facility] know she has follow up about her knee on 10-24-2021.    Goals Addressed             This Visit's Progress    RNCM: Effective Management of CKD       Care Coordination Interventions: BP Readings from Last 3 Encounters:  09/15/21 (!) 146/54  09/02/21 (!) 127/52  06/24/21 (!) 153/81    Lab Results  Component Value Date   CREATININE 2.52 (H) 09/13/2021   BUN 40 (H) 09/13/2021   NA 135 09/13/2021   K 5.1 09/13/2021   CL 111 09/13/2021   CO2 21 (L) 09/13/2021   GFR: 30 from labs from nephrology 10-07-2021  Assessed the Patient understanding of chronic kidney disease. 10-16-2021: The patient saw the nephrologist today and sees every 4 months. She is mindful of how her CKD can change and this is her biggest concern. She checks her blood sugars BID and usually it is around 100. Does have some episodes of hypoglycemia but knows how to treat. Last A1C was 5.6 in July 2023.    Evaluation of current treatment plan related to chronic kidney disease self management and patient's adherence to plan as established by provider. 10-17-2021: Sees pcp and nephrologist on a regular basis. Is compliant with the plan of care.     Provided education to patient re: stroke prevention, s/s of heart attack and stroke    Reviewed prescribed diet heart healthy/ADA/Renal. 10-16-2021: Mindful of dietary restrictions Reviewed medications with patient and discussed importance of compliance. 10-16-2021: Is compliant with medications. Does get assistance for Rybelsus. Also working with specialist to get Eliquis. Review of pharmacy resources to assist with medication assistance  if needed.    Counseled on adverse effects of illicit drug and excessive alcohol use in patients with chronic kidney disease    Counseled on the importance of exercise goals with target of 150 minutes per week. 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 She is also doing exercises at home. Uses a cane when she is out and about. Denies any issues with falls or safety concerns   Advised patient, providing education and rationale, to monitor blood pressure daily and record, calling PCP for findings outside established parameters    Discussed complications of poorly controlled blood pressure such as heart disease, stroke, circulatory complications, vision complications, kidney impairment, sexual dysfunction    Reviewed scheduled/upcoming provider appointments including: Saw nephrologist today. Will see again in 4 months. Sees pcp on a regular basis. Knows to call for changes   Advised patient to discuss changes in kidney function, questions and concerns with provider    Discussed plans with patient for ongoing care management follow up and provided patient with direct contact information for care management team    Screening for signs and symptoms of depression related to chronic disease state      Discussed the impact of chronic kidney disease on daily life and mental health and acknowledged and normalized feelings of disempowerment, fear, and frustration    Assessed social determinant of  health barriers    Provided education on kidney disease progression    Engage patient in early, proactive and ongoing discussion about goals of care and what matters most to them    Support coping and stress management by recognizing current strategies and assist in developing new strategies such as mindfulness, journaling, relaxation techniques, problem-solving     Food Basics for Chronic Kidney Disease Chronic kidney disease (CKD) is when your kidneys are not  working well. They cannot remove waste, fluids, and other substances from your blood the way they should. These substances can build up, which can worsen kidney damage and affect how your body works. Eating certain foods can lead to a buildup of these substances. Changing your diet can help prevent more kidney damage. Diet changes may also delay dialysis or even keep you from needing it. What nutrients should I limit? Work with your treatment team and a food expert (dietitian) to make a meal plan that's right for you. Foods you can eat and foods you should limit or avoid will depend on the stage of your kidney disease and any other health conditions you have. The items listed below are not a complete list. Talk with your dietitian to learn what is best for you. Potassium Potassium affects how steadily your heart beats. Too much potassium in your blood can cause an irregular heartbeat or even a heart attack. You may need to limit foods that are high in potassium, such as: Liquid milk and soy milk. Salt substitutes that contain potassium. Fruits like bananas, apricots, nectarines, melon, prunes, raisins, kiwi, and oranges. Vegetables, such as potatoes, sweet potatoes, yams, tomatoes, leafy greens, beets, avocado, pumpkin, and winter squash. Beans, like lima beans. Nuts. Phosphorus Phosphorus is a mineral found in your bones. You need a balance between calcium and phosphorus to build and maintain healthy bones. Too much added phosphorus from the foods you eat can pull calcium from your bones. Losing calcium can make your bones weak and more likely to break. Too much phosphorus can also make your skin itch. You may need to limit foods that are high in phosphorus or that have added phosphorus, such as: Liquid milk and dairy products. Dark-colored sodas or soft drinks. Bran cereals and oatmeal. Protein  Protein helps you make and keep muscle. Protein also helps to repair your body's cells and tissues.  One of the natural breakdown products of protein is a waste product called urea. When your kidneys are not working well, they cannot remove types of waste like urea. Reducing protein in your diet can help keep urea from building up in your blood. Depending on your stage of kidney disease, you may need to eat smaller portions of foods that are high in protein. Sources of animal protein include: Meat (all types). Fish and seafood. Poultry. Eggs. Dairy. Other protein foods include: Beans and legumes. Nuts and nut butter. Soy, like tofu.  Sodium Salt (sodium) helps to keep a healthy balance of fluids in your body. Too much salt can increase your blood pressure, which can harm your heart and lungs. Extra salt can also cause your body to keep too much fluid, making your kidneys work harder. You may need to limit or avoid foods that are high in salt, such as: Salt seasonings. Soy and teriyaki sauce. Packaged, precooked, cured, or processed meats, such as sausages or meat loaves. Sardines. Salted crackers and snack foods. Fast food. Canned soups and most canned foods. Pickled foods. Vegetable juice. Boxed mixes or ready-to-eat  boxed meals and side dishes. Bottled dressings, sauces, and marinades. Talk with your dietitian about how much potassium, phosphorus, protein, and salt you may have each day. Helpful tips Read food labels  Check the amount of salt in foods. Limit foods that have salt or sodium listed among the first five ingredients. Try to eat low-salt foods. Check the ingredient list for added phosphorus or potassium. "Phos" in an ingredient is a sign that phosphorus has been added. Do not buy foods that are calcium-enriched or that have calcium added to them (are fortified). Buy canned vegetables and beans that say "no salt added" and rinse them before eating. Lifestyle Limit the amount of protein you eat from animal sources each day. Focus on protein from plant sources, like tofu  and dried beans, peas, and lentils. Do not add salt to food when cooking or before eating. Do not eat star fruit. It can be toxic for people with kidney problems. Talk with your health care provider before taking any vitamin or mineral supplements. If told by your health care provider, track how much liquid you drink so you can avoid drinking too much. You may need to include foods you eat that are made mostly from water, like gelatin, ice cream, soups, and juicy fruits and vegetables. If you have diabetes: If you have diabetes (diabetes mellitus) and CKD, you need to keep your blood sugar (glucose) in the target range recommended by your health care provider. Follow your diabetes management plan. This may include: Checking your blood glucose regularly. Taking medicines by mouth, or taking insulin, or both. Exercising for at least 30 minutes on 5 or more days each week, or as told by your health care provider. Tracking how many servings of carbohydrates you eat at each meal. Not using orange juice to treat low blood sugars. Instead, use apple juice, cranberry juice, or clear soda. You may be given guidelines on what foods and nutrients you may eat, and how much you can have each day. This depends on your stage of kidney disease and whether you have high blood pressure (hypertension). Follow the meal plan your dietitian gives you. To learn more: [employer] of Diabetes and Digestive and Kidney Diseases: Amen[employer]: kidney.org Summary Chronic kidney disease (CKD) is when your kidneys are not working well. They cannot remove waste, fluids, and other substances from your blood the way they should. These substances can build up, which can worsen kidney damage and affect how your body works. Changing your diet can help prevent more kidney damage. Diet changes may also delay dialysis or even keep you from needing it. Diet changes are different for each person with  CKD. Work with a dietitian to set up a meal plan that is right for you. This information is not intended to replace advice given to you by your health care provider. Make sure you discuss any questions you have with your health care provider. Document Revised: 06/20/2021 Document Reviewed: 06/26/2019 Elsevier Patient Education  [place]. Chronic Kidney Disease, Adult Chronic kidney disease (CKD) occurs when the kidneys are slowly and permanently damaged over a long period of time. The kidneys are a pair of organs that do many important jobs in the body, including: Removing waste and extra fluid from the blood to make urine. Making hormones that maintain the amount of fluid in tissues and blood vessels. Maintaining the right amount of fluids and chemicals in the body. A small amount of kidney damage may not cause  problems, but a large amount of damage may make it hard or impossible for the kidneys to work right. Steps must be taken to slow kidney damage or to stop it from getting worse. If steps are not taken, the kidneys may stop working permanently (end-stage renal disease, or ESRD). Most of the time, CKD does not go away, but it can often be controlled. People who have CKD are usually able to live full lives. What are the causes? The most common causes of this condition are diabetes and high blood pressure (hypertension). Other causes include: Cardiovascular diseases. These affect the heart and blood vessels. Kidney diseases. These include: Glomerulonephritis, or inflammation of the tiny filters in the kidneys. Interstitial nephritis. This is swelling of the small tubes of the kidneys and of the surrounding structures. Polycystic kidney disease, in which clusters of fluid-filled sacs form within the kidneys. Renal vascular disease. This includes disorders that affect the arteries and veins of the kidneys. Diseases that affect the body's defense system (immune system). A problem with  urine flow. This may be caused by: Kidney stones. Cancer. An enlarged prostate, in males. A kidney infection or urinary tract infection (UTI) that keeps coming back. Vasculitis. This is swelling or inflammation of the blood vessels. What increases the risk? Your chances of having kidney disease increase with age. The following factors may make you more likely to develop this condition: A family history of kidney disease or kidney failure. Kidney failure means the kidneys can no longer work right. Certain genetic diseases. Taking medicines often that are damaging to the kidneys. Being around or being in contact with toxic substances. Obesity. A history of tobacco use. What are the signs or symptoms? Symptoms of this condition include: Feeling very tired (lethargic) and having less energy. Swelling, or edema, of the face, legs, ankles, or feet. Nausea or vomiting, or loss of appetite. Confusion or trouble concentrating. Muscle twitches and cramps, especially in the legs. Dry, itchy skin. A metallic taste in the mouth. Producing less urine, or producing more urine (especially at night). Shortness of breath. Trouble sleeping. CKD may also result in not having enough red blood cells or hemoglobin in the blood (anemia) or having weak bones (bone disease). Symptoms develop slowly and may not be obvious until the kidney damage becomes severe. It is possible to have kidney disease for years without having symptoms. How is this diagnosed? This condition may be diagnosed based on: Blood tests. Urine tests. Imaging tests, such as an ultrasound or a CT scan. A kidney biopsy. This involves removing a sample of kidney tissue to be looked at under a microscope. Results from these tests will help to determine how serious the CKD is. How is this treated? There is no cure for most cases of this condition, but treatment usually relieves symptoms and prevents or slows the worsening of the disease.  Treatment may include: Diet changes, which may require you to avoid alcohol and foods that are high in salt, potassium, phosphorous, and protein. Medicines. These may: Lower blood pressure. Control blood sugar (glucose). Relieve anemia. Relieve swelling. Protect your bones. Improve the balance of salts and minerals in your blood (electrolytes). Dialysis, which is a type of treatment that removes toxic waste from the body. It may be needed if you have kidney failure. Managing any other conditions that are causing your CKD or making it worse. Follow these instructions at home: Medicines Take over-the-counter and prescription medicines only as told by your health care provider. The amount  of some medicines that you take may need to be changed. Do not take any new medicines unless approved by your health care provider. Many medicines can make kidney damage worse. Do not take any vitamin and mineral supplements unless approved by your health care provider. Many nutritional supplements can make kidney damage worse. Lifestyle  Do not use any products that contain nicotine or tobacco, such as cigarettes, e-cigarettes, and chewing tobacco. If you need help quitting, ask your health care provider. If you drink alcohol: Limit how much you use to: 0-1 drink a day for women who are not pregnant. 0-2 drinks a day for men. Know how much alcohol is in your drink. In the [place] one drink equals one 12 oz bottle of beer (355 mL), one 5 oz glass of wine (148 mL), or one 1 oz glass of hard liquor (44 mL). Maintain a healthy weight. If you need help, ask your health care provider. General instructions  Follow instructions from your health care provider about eating or drinking restrictions, including any prescribed diet. Track your blood pressure at home. Report changes in your blood pressure as told. If you are being treated for diabetes, track your blood glucose levels as told. Start or continue an  exercise plan. Exercise at least 30 minutes a day, 5 days a week. Keep your immunizations up to date as told. Keep all follow-up visits. This is important. Where to find more information American Association of Kidney Patients: BombTimer.gl National Kidney Foundation: www.kidney.[place]: https://mathis.com/ Life Options: www.lifeoptions.org Kidney School: www.kidneyschool.org Contact a health care provider if: Your symptoms get worse. You develop new symptoms. Get help right away if: You develop symptoms of ESRD. These include: Headaches. Numbness in your hands or feet. Easy bruising. Frequent hiccups. Chest pain. Shortness of breath. Lack of menstrual periods, in women. You have a fever. You are producing less urine than usual. You have pain or bleeding when you urinate or when you have a bowel movement. These symptoms may represent a serious problem that is an emergency. Do not wait to see if the symptoms will go away. Get medical help right away. Call your local emergency services (911 in the [place] Do not drive yourself to the hospital. Summary Chronic kidney disease (CKD) occurs when the kidneys become damaged slowly over a long period of time. The most common causes of this condition are diabetes and high blood pressure (hypertension). There is no cure for most cases of CKD, but treatment usually relieves symptoms and prevents or slows the worsening of the disease. Treatment may include a combination of lifestyle changes, medicines, and dialysis. This information is not intended to replace advice given to you by your health care provider. Make sure you discuss any questions you have with your health care provider. Document Revised: 06/07/2019 Document Reviewed: 06/07/2019 Elsevier Patient Education  [place]: Management of knee pain       Care Coordination Interventions: Reviewed provider established plan for pain management Discussed  importance of adherence to all scheduled medical appointments. Patient has a follow up appointment with specialist about her knee on 10-24-2021. The patient knows to call the [facility] for new concerns or changes. Will continue to monitor and support with new concerns or needs Counseled on the importance of reporting any/all new or changed pain symptoms or management strategies to pain management provider Advised patient to report to care team affect of pain on daily activities Discussed use  of relaxation techniques and/or diversional activities to assist with pain reduction (distraction, imagery, relaxation, massage, acupressure, TENS, heat, and cold application Reviewed with patient prescribed pharmacological and nonpharmacological pain relief strategies Advised patient to discuss changes in level or intensity of pain, or unresolved pain with provider Screening for signs and symptoms of depression related to chronic disease state  Assessed social determinant of health barriers AWV completed on 06-05-2021           SDOH assessments and interventions completed:  No     Care Coordination Interventions Activated:  Yes  Care Coordination Interventions:  Yes, provided   Follow up plan: Follow up call scheduled for 01-07-2022 at 130 pm    Encounter Outcome:  Pt. Visit Completed   Noreene Larsson RN, MSN, [facility] Mobile: [phone]

## 2021-10-22 NOTE — Patient Instructions (Signed)
Visit Information  Thank you for taking time to visit with me today. Please don't hesitate to contact me if I can be of assistance to you.   Following are the goals we discussed today:   Goals Addressed             This Visit's Progress    RNCM: Effective Management of CKD       Care Coordination Interventions: BP Readings from Last 3 Encounters:  09/15/21 (!) 146/54  09/02/21 (!) 127/52  06/24/21 (!) 153/81    Lab Results  Component Value Date   CREATININE 2.52 (H) 09/13/2021   BUN 40 (H) 09/13/2021   NA 135 09/13/2021   K 5.1 09/13/2021   CL 111 09/13/2021   CO2 21 (L) 09/13/2021   GFR: 30 from labs from nephrology 10-07-2021  Assessed the Patient understanding of chronic kidney disease. 10-16-2021: The patient saw the nephrologist today and sees every 4 months. She is mindful of how her CKD can change and this is her biggest concern. She checks her blood sugars BID and usually it is around 100. Does have some episodes of hypoglycemia but knows how to treat. Last A1C was 5.6 in July 2023.    Evaluation of current treatment plan related to chronic kidney disease self management and patient's adherence to plan as established by provider. 10-17-2021: Sees pcp and nephrologist on a regular basis. Is compliant with the plan of care.     Provided education to patient re: stroke prevention, s/s of heart attack and stroke    Reviewed prescribed diet heart healthy/ADA/Renal. 10-16-2021: Mindful of dietary restrictions Reviewed medications with patient and discussed importance of compliance. 10-16-2021: Is compliant with medications. Does get assistance for Rybelsus. Also working with specialist to get Eliquis. Review of pharmacy resources to assist with medication assistance if needed.    Counseled on adverse effects of illicit drug and excessive alcohol use in patients with chronic kidney disease    Counseled on the importance of exercise goals with target of 150 minutes per week. 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 She is also doing exercises at home. Uses a cane when she is out and about. Denies any issues with falls or safety concerns   Advised patient, providing education and rationale, to monitor blood pressure daily and record, calling PCP for findings outside established parameters    Discussed complications of poorly controlled blood pressure such as heart disease, stroke, circulatory complications, vision complications, kidney impairment, sexual dysfunction    Reviewed scheduled/upcoming provider appointments including: Saw nephrologist today. Will see again in 4 months. Sees pcp on a regular basis. Knows to call for changes   Advised patient to discuss changes in kidney function, questions and concerns with provider    Discussed plans with patient for ongoing care management follow up and provided patient with direct contact information for care management team    Screening for signs and symptoms of depression related to chronic disease state      Discussed the impact of chronic kidney disease on daily life and mental health and acknowledged and normalized feelings of disempowerment, fear, and frustration    Assessed social determinant of health barriers    Provided education on kidney disease progression    Engage patient in early, proactive and ongoing discussion about goals of care and what matters most to them    Support coping and stress management by recognizing current strategies  and assist in developing new strategies such as mindfulness, journaling, relaxation techniques, problem-solving     Food Basics for Chronic Kidney Disease Chronic kidney disease (CKD) is when your kidneys are not working well. They cannot remove waste, fluids, and other substances from your blood the way they should. These substances can build up, which can worsen kidney damage and affect how your body works. Eating certain foods can  lead to a buildup of these substances. Changing your diet can help prevent more kidney damage. Diet changes may also delay dialysis or even keep you from needing it. What nutrients should I limit? Work with your treatment team and a food expert (dietitian) to make a meal plan that's right for you. Foods you can eat and foods you should limit or avoid will depend on the stage of your kidney disease and any other health conditions you have. The items listed below are not a complete list. Talk with your dietitian to learn what is best for you. Potassium Potassium affects how steadily your heart beats. Too much potassium in your blood can cause an irregular heartbeat or even a heart attack. You may need to limit foods that are high in potassium, such as: Liquid milk and soy milk. Salt substitutes that contain potassium. Fruits like bananas, apricots, nectarines, melon, prunes, raisins, kiwi, and oranges. Vegetables, such as potatoes, sweet potatoes, yams, tomatoes, leafy greens, beets, avocado, pumpkin, and winter squash. Beans, like lima beans. Nuts. Phosphorus Phosphorus is a mineral found in your bones. You need a balance between calcium and phosphorus to build and maintain healthy bones. Too much added phosphorus from the foods you eat can pull calcium from your bones. Losing calcium can make your bones weak and more likely to break. Too much phosphorus can also make your skin itch. You may need to limit foods that are high in phosphorus or that have added phosphorus, such as: Liquid milk and dairy products. Dark-colored sodas or soft drinks. Bran cereals and oatmeal. Protein  Protein helps you make and keep muscle. Protein also helps to repair your body's cells and tissues. One of the natural breakdown products of protein is a waste product called urea. When your kidneys are not working well, they cannot remove types of waste like urea. Reducing protein in your diet can help keep urea from  building up in your blood. Depending on your stage of kidney disease, you may need to eat smaller portions of foods that are high in protein. Sources of animal protein include: Meat (all types). Fish and seafood. Poultry. Eggs. Dairy. Other protein foods include: Beans and legumes. Nuts and nut butter. Soy, like tofu.  Sodium Salt (sodium) helps to keep a healthy balance of fluids in your body. Too much salt can increase your blood pressure, which can harm your heart and lungs. Extra salt can also cause your body to keep too much fluid, making your kidneys work harder. You may need to limit or avoid foods that are high in salt, such as: Salt seasonings. Soy and teriyaki sauce. Packaged, precooked, cured, or processed meats, such as sausages or meat loaves. Sardines. Salted crackers and snack foods. Fast food. Canned soups and most canned foods. Pickled foods. Vegetable juice. Boxed mixes or ready-to-eat boxed meals and side dishes. Bottled dressings, sauces, and marinades. Talk with your dietitian about how much potassium, phosphorus, protein, and salt you may have each day. Helpful tips Read food labels  Check the amount of salt in foods. Limit foods that have  salt or sodium listed among the first five ingredients. Try to eat low-salt foods. Check the ingredient list for added phosphorus or potassium. "Phos" in an ingredient is a sign that phosphorus has been added. Do not buy foods that are calcium-enriched or that have calcium added to them (are fortified). Buy canned vegetables and beans that say "no salt added" and rinse them before eating. Lifestyle Limit the amount of protein you eat from animal sources each day. Focus on protein from plant sources, like tofu and dried beans, peas, and lentils. Do not add salt to food when cooking or before eating. Do not eat star fruit. It can be toxic for people with kidney problems. Talk with your health care provider before taking any  vitamin or mineral supplements. If told by your health care provider, track how much liquid you drink so you can avoid drinking too much. You may need to include foods you eat that are made mostly from water, like gelatin, ice cream, soups, and juicy fruits and vegetables. If you have diabetes: If you have diabetes (diabetes mellitus) and CKD, you need to keep your blood sugar (glucose) in the target range recommended by your health care provider. Follow your diabetes management plan. This may include: Checking your blood glucose regularly. Taking medicines by mouth, or taking insulin, or both. Exercising for at least 30 minutes on 5 or more days each week, or as told by your health care provider. Tracking how many servings of carbohydrates you eat at each meal. Not using orange juice to treat low blood sugars. Instead, use apple juice, cranberry juice, or clear soda. You may be given guidelines on what foods and nutrients you may eat, and how much you can have each day. This depends on your stage of kidney disease and whether you have high blood pressure (hypertension). Follow the meal plan your dietitian gives you. To learn more: [employer] of Diabetes and Digestive and Kidney Diseases: [employer]: kidney.org Summary Chronic kidney disease (CKD) is when your kidneys are not working well. They cannot remove waste, fluids, and other substances from your blood the way they should. These substances can build up, which can worsen kidney damage and affect how your body works. Changing your diet can help prevent more kidney damage. Diet changes may also delay dialysis or even keep you from needing it. Diet changes are different for each person with CKD. Work with a dietitian to set up a meal plan that is right for you. This information is not intended to replace advice given to you by your health care provider. Make sure you discuss any questions you have with your  health care provider. Document Revised: 06/20/2021 Document Reviewed: 06/26/2019 Elsevier Patient Education  [place]. Chronic Kidney Disease, Adult Chronic kidney disease (CKD) occurs when the kidneys are slowly and permanently damaged over a long period of time. The kidneys are a pair of organs that do many important jobs in the body, including: Removing waste and extra fluid from the blood to make urine. Making hormones that maintain the amount of fluid in tissues and blood vessels. Maintaining the right amount of fluids and chemicals in the body. A small amount of kidney damage may not cause problems, but a large amount of damage may make it hard or impossible for the kidneys to work right. Steps must be taken to slow kidney damage or to stop it from getting worse. If steps are not taken, the kidneys may stop working  permanently (end-stage renal disease, or ESRD). Most of the time, CKD does not go away, but it can often be controlled. People who have CKD are usually able to live full lives. What are the causes? The most common causes of this condition are diabetes and high blood pressure (hypertension). Other causes include: Cardiovascular diseases. These affect the heart and blood vessels. Kidney diseases. These include: Glomerulonephritis, or inflammation of the tiny filters in the kidneys. Interstitial nephritis. This is swelling of the small tubes of the kidneys and of the surrounding structures. Polycystic kidney disease, in which clusters of fluid-filled sacs form within the kidneys. Renal vascular disease. This includes disorders that affect the arteries and veins of the kidneys. Diseases that affect the body's defense system (immune system). A problem with urine flow. This may be caused by: Kidney stones. Cancer. An enlarged prostate, in males. A kidney infection or urinary tract infection (UTI) that keeps coming back. Vasculitis. This is swelling or inflammation of the  blood vessels. What increases the risk? Your chances of having kidney disease increase with age. The following factors may make you more likely to develop this condition: A family history of kidney disease or kidney failure. Kidney failure means the kidneys can no longer work right. Certain genetic diseases. Taking medicines often that are damaging to the kidneys. Being around or being in contact with toxic substances. Obesity. A history of tobacco use. What are the signs or symptoms? Symptoms of this condition include: Feeling very tired (lethargic) and having less energy. Swelling, or edema, of the face, legs, ankles, or feet. Nausea or vomiting, or loss of appetite. Confusion or trouble concentrating. Muscle twitches and cramps, especially in the legs. Dry, itchy skin. A metallic taste in the mouth. Producing less urine, or producing more urine (especially at night). Shortness of breath. Trouble sleeping. CKD may also result in not having enough red blood cells or hemoglobin in the blood (anemia) or having weak bones (bone disease). Symptoms develop slowly and may not be obvious until the kidney damage becomes severe. It is possible to have kidney disease for years without having symptoms. How is this diagnosed? This condition may be diagnosed based on: Blood tests. Urine tests. Imaging tests, such as an ultrasound or a CT scan. A kidney biopsy. This involves removing a sample of kidney tissue to be looked at under a microscope. Results from these tests will help to determine how serious the CKD is. How is this treated? There is no cure for most cases of this condition, but treatment usually relieves symptoms and prevents or slows the worsening of the disease. Treatment may include: Diet changes, which may require you to avoid alcohol and foods that are high in salt, potassium, phosphorous, and protein. Medicines. These may: Lower blood pressure. Control blood sugar  (glucose). Relieve anemia. Relieve swelling. Protect your bones. Improve the balance of salts and minerals in your blood (electrolytes). Dialysis, which is a type of treatment that removes toxic waste from the body. It may be needed if you have kidney failure. Managing any other conditions that are causing your CKD or making it worse. Follow these instructions at home: Medicines Take over-the-counter and prescription medicines only as told by your health care provider. The amount of some medicines that you take may need to be changed. Do not take any new medicines unless approved by your health care provider. Many medicines can make kidney damage worse. Do not take any vitamin and mineral supplements unless approved by your health  care provider. Many nutritional supplements can make kidney damage worse. Lifestyle  Do not use any products that contain nicotine or tobacco, such as cigarettes, e-cigarettes, and chewing tobacco. If you need help quitting, ask your health care provider. If you drink alcohol: Limit how much you use to: 0-1 drink a day for women who are not pregnant. 0-2 drinks a day for men. Know how much alcohol is in your drink. In the [place] one drink equals one 12 oz bottle of beer (355 mL), one 5 oz glass of wine (148 mL), or one 1 oz glass of hard liquor (44 mL). Maintain a healthy weight. If you need help, ask your health care provider. General instructions  Follow instructions from your health care provider about eating or drinking restrictions, including any prescribed diet. Track your blood pressure at home. Report changes in your blood pressure as told. If you are being treated for diabetes, track your blood glucose levels as told. Start or continue an exercise plan. Exercise at least 30 minutes a day, 5 days a week. Keep your immunizations up to date as told. Keep all follow-up visits. This is important. Where to find more information American Association of  Kidney Patients: BombTimer.gl National Kidney Foundation: www.kidney.[place]: https://mathis.com/ Life Options: www.lifeoptions.org Kidney School: www.kidneyschool.org Contact a health care provider if: Your symptoms get worse. You develop new symptoms. Get help right away if: You develop symptoms of ESRD. These include: Headaches. Numbness in your hands or feet. Easy bruising. Frequent hiccups. Chest pain. Shortness of breath. Lack of menstrual periods, in women. You have a fever. You are producing less urine than usual. You have pain or bleeding when you urinate or when you have a bowel movement. These symptoms may represent a serious problem that is an emergency. Do not wait to see if the symptoms will go away. Get medical help right away. Call your local emergency services (911 in the [place] Do not drive yourself to the hospital. Summary Chronic kidney disease (CKD) occurs when the kidneys become damaged slowly over a long period of time. The most common causes of this condition are diabetes and high blood pressure (hypertension). There is no cure for most cases of CKD, but treatment usually relieves symptoms and prevents or slows the worsening of the disease. Treatment may include a combination of lifestyle changes, medicines, and dialysis. This information is not intended to replace advice given to you by your health care provider. Make sure you discuss any questions you have with your health care provider. Document Revised: 06/07/2019 Document Reviewed: 06/07/2019 Elsevier Patient Education  [place]: Management of knee pain       Care Coordination Interventions: Reviewed provider established plan for pain management Discussed importance of adherence to all scheduled medical appointments. Patient has a follow up appointment with specialist about her knee on 10-24-2021. The patient knows to call the [facility] for new concerns or changes. Will  continue to monitor and support with new concerns or needs Counseled on the importance of reporting any/all new or changed pain symptoms or management strategies to pain management provider Advised patient to report to care team affect of pain on daily activities Discussed use of relaxation techniques and/or diversional activities to assist with pain reduction (distraction, imagery, relaxation, massage, acupressure, TENS, heat, and cold application Reviewed with patient prescribed pharmacological and nonpharmacological pain relief strategies Advised patient to discuss changes in level or intensity of pain, or unresolved  pain with provider Screening for signs and symptoms of depression related to chronic disease state  Assessed social determinant of health barriers AWV completed on 06-05-2021           Our next appointment is by telephone on 01-07-2022 at 130 pm  Please call the care guide team at [phone] if you need to cancel or reschedule your appointment.   If you are experiencing a Mental Health or [place] or need someone to talk to, please call the Suicide and Crisis Lifeline: 988 call the [place] National Suicide Prevention Lifeline: (337)234-6859 or TTY: 320-565-3985 TTY 6304698635) to talk to a trained counselor call 1-800-273-TALK (toll free, 24 hour hotline)  Patient verbalizes understanding of instructions and care plan provided today and agrees to view in [place]. Active MyChart status and patient understanding of how to access instructions and care plan via MyChart confirmed with patient.     Telephone follow up appointment with care management team member scheduled for: 01-07-2022 at 130 pm  [place], MSN, [place] [employer] Mobile: [phone]

## 2021-10-23 DIAGNOSIS — Z96652 Presence of left artificial knee joint: Secondary | ICD-10-CM | POA: Diagnosis not present

## 2021-10-23 DIAGNOSIS — G4733 Obstructive sleep apnea (adult) (pediatric): Secondary | ICD-10-CM | POA: Diagnosis not present

## 2021-10-23 DIAGNOSIS — I10 Essential (primary) hypertension: Secondary | ICD-10-CM | POA: Diagnosis not present

## 2021-10-29 DIAGNOSIS — Z96652 Presence of left artificial knee joint: Secondary | ICD-10-CM | POA: Diagnosis not present

## 2021-10-31 DIAGNOSIS — M25562 Pain in left knee: Secondary | ICD-10-CM | POA: Diagnosis not present

## 2021-10-31 DIAGNOSIS — Z96652 Presence of left artificial knee joint: Secondary | ICD-10-CM | POA: Diagnosis not present

## 2021-11-05 DIAGNOSIS — M25562 Pain in left knee: Secondary | ICD-10-CM | POA: Diagnosis not present

## 2021-11-05 DIAGNOSIS — Z96652 Presence of left artificial knee joint: Secondary | ICD-10-CM | POA: Diagnosis not present

## 2021-11-06 ENCOUNTER — Ambulatory Visit: Payer: PPO | Admitting: Radiation Oncology

## 2021-11-07 DIAGNOSIS — Z96652 Presence of left artificial knee joint: Secondary | ICD-10-CM | POA: Diagnosis not present

## 2021-11-07 DIAGNOSIS — M25562 Pain in left knee: Secondary | ICD-10-CM | POA: Diagnosis not present

## 2021-11-24 DIAGNOSIS — G4733 Obstructive sleep apnea (adult) (pediatric): Secondary | ICD-10-CM | POA: Diagnosis not present

## 2021-11-24 DIAGNOSIS — I10 Essential (primary) hypertension: Secondary | ICD-10-CM | POA: Diagnosis not present

## 2021-12-03 DIAGNOSIS — I10 Essential (primary) hypertension: Secondary | ICD-10-CM | POA: Diagnosis not present

## 2021-12-03 DIAGNOSIS — Z6841 Body Mass Index (BMI) 40.0 and over, adult: Secondary | ICD-10-CM | POA: Diagnosis not present

## 2021-12-03 DIAGNOSIS — I25119 Atherosclerotic heart disease of native coronary artery with unspecified angina pectoris: Secondary | ICD-10-CM | POA: Diagnosis not present

## 2021-12-03 DIAGNOSIS — E1122 Type 2 diabetes mellitus with diabetic chronic kidney disease: Secondary | ICD-10-CM | POA: Diagnosis not present

## 2021-12-03 DIAGNOSIS — N184 Chronic kidney disease, stage 4 (severe): Secondary | ICD-10-CM | POA: Diagnosis not present

## 2021-12-03 DIAGNOSIS — I480 Paroxysmal atrial fibrillation: Secondary | ICD-10-CM | POA: Diagnosis not present

## 2021-12-10 DIAGNOSIS — Z23 Encounter for immunization: Secondary | ICD-10-CM | POA: Diagnosis not present

## 2021-12-10 DIAGNOSIS — I10 Essential (primary) hypertension: Secondary | ICD-10-CM | POA: Diagnosis not present

## 2021-12-10 DIAGNOSIS — N184 Chronic kidney disease, stage 4 (severe): Secondary | ICD-10-CM | POA: Diagnosis not present

## 2021-12-10 DIAGNOSIS — E7800 Pure hypercholesterolemia, unspecified: Secondary | ICD-10-CM | POA: Diagnosis not present

## 2021-12-10 DIAGNOSIS — I25119 Atherosclerotic heart disease of native coronary artery with unspecified angina pectoris: Secondary | ICD-10-CM | POA: Diagnosis not present

## 2021-12-10 DIAGNOSIS — Z9989 Dependence on other enabling machines and devices: Secondary | ICD-10-CM | POA: Diagnosis not present

## 2021-12-10 DIAGNOSIS — G4733 Obstructive sleep apnea (adult) (pediatric): Secondary | ICD-10-CM | POA: Diagnosis not present

## 2021-12-10 DIAGNOSIS — I480 Paroxysmal atrial fibrillation: Secondary | ICD-10-CM | POA: Diagnosis not present

## 2021-12-10 DIAGNOSIS — E1122 Type 2 diabetes mellitus with diabetic chronic kidney disease: Secondary | ICD-10-CM | POA: Diagnosis not present

## 2021-12-11 DIAGNOSIS — I2510 Atherosclerotic heart disease of native coronary artery without angina pectoris: Secondary | ICD-10-CM | POA: Diagnosis not present

## 2021-12-11 DIAGNOSIS — E782 Mixed hyperlipidemia: Secondary | ICD-10-CM | POA: Diagnosis not present

## 2021-12-11 DIAGNOSIS — R001 Bradycardia, unspecified: Secondary | ICD-10-CM | POA: Diagnosis not present

## 2021-12-11 DIAGNOSIS — E669 Obesity, unspecified: Secondary | ICD-10-CM | POA: Diagnosis not present

## 2021-12-11 DIAGNOSIS — E119 Type 2 diabetes mellitus without complications: Secondary | ICD-10-CM | POA: Diagnosis not present

## 2021-12-11 DIAGNOSIS — I10 Essential (primary) hypertension: Secondary | ICD-10-CM | POA: Diagnosis not present

## 2021-12-11 DIAGNOSIS — G4733 Obstructive sleep apnea (adult) (pediatric): Secondary | ICD-10-CM | POA: Diagnosis not present

## 2021-12-11 DIAGNOSIS — Z794 Long term (current) use of insulin: Secondary | ICD-10-CM | POA: Diagnosis not present

## 2021-12-11 DIAGNOSIS — I480 Paroxysmal atrial fibrillation: Secondary | ICD-10-CM | POA: Diagnosis not present

## 2021-12-17 ENCOUNTER — Ambulatory Visit
Admission: RE | Admit: 2021-12-17 | Discharge: 2021-12-17 | Disposition: A | Discharge status: Home / Self Care | Payer: PPO | Source: Ambulatory Visit | Attending: Oncology | Admitting: Oncology

## 2021-12-17 DIAGNOSIS — E119 Type 2 diabetes mellitus without complications: Secondary | ICD-10-CM | POA: Insufficient documentation

## 2021-12-17 DIAGNOSIS — Z780 Asymptomatic menopausal state: Secondary | ICD-10-CM | POA: Insufficient documentation

## 2021-12-17 DIAGNOSIS — Z923 Personal history of irradiation: Secondary | ICD-10-CM | POA: Insufficient documentation

## 2021-12-17 DIAGNOSIS — C50412 Malignant neoplasm of upper-outer quadrant of left female breast: Secondary | ICD-10-CM | POA: Insufficient documentation

## 2021-12-17 DIAGNOSIS — Z1231 Encounter for screening mammogram for malignant neoplasm of breast: Secondary | ICD-10-CM | POA: Diagnosis not present

## 2021-12-17 DIAGNOSIS — M85852 Other specified disorders of bone density and structure, left thigh: Secondary | ICD-10-CM | POA: Insufficient documentation

## 2021-12-17 DIAGNOSIS — Z853 Personal history of malignant neoplasm of breast: Secondary | ICD-10-CM | POA: Insufficient documentation

## 2021-12-17 DIAGNOSIS — Z13820 Encounter for screening for osteoporosis: Secondary | ICD-10-CM | POA: Insufficient documentation

## 2021-12-22 ENCOUNTER — Ambulatory Visit
Admission: RE | Admit: 2021-12-22 | Discharge: 2021-12-22 | Disposition: A | Discharge status: Home / Self Care | Payer: PPO | Source: Ambulatory Visit | Attending: Radiation Oncology | Admitting: Radiation Oncology

## 2021-12-22 VITALS — BP 114/93 | HR 69 | Resp 18 | Ht 62.0 in | Wt 209.0 lb

## 2021-12-22 DIAGNOSIS — C50912 Malignant neoplasm of unspecified site of left female breast: Secondary | ICD-10-CM

## 2021-12-22 DIAGNOSIS — Z170 Estrogen receptor positive status [ER+]: Secondary | ICD-10-CM | POA: Diagnosis not present

## 2021-12-22 DIAGNOSIS — C50412 Malignant neoplasm of upper-outer quadrant of left female breast: Secondary | ICD-10-CM | POA: Diagnosis not present

## 2021-12-22 NOTE — Progress Notes (Signed)
Radiation Oncology Follow up Note  Name: Bonnie Holt   Date:   12/22/2021 MRN:  [id number] DOB: [birth year]    This 76 y.o. female presents to the clinic today for close to 5-year follow-up status post whole breast radiation to her left breast for stage I ER/PR positive invasive mammary carcinoma.  REFERRING PROVIDER: Kirk Ruths, MD  HPI: Patient is a 76 year old female now at close to 5 years having completed whole breast radiation to her left breast for stage I ER/PR positive base of mammary carcinoma.  Seen today in routine follow-up she is doing well.  She specifically denies breast tenderness cough or bone pain..  She had mammograms this month which I have reviewed were BI-RADS 1 negative.  She is currently on Femara tolerating that well without side effect.  COMPLICATIONS OF TREATMENT: none  FOLLOW UP COMPLIANCE: keeps appointments   PHYSICAL EXAM:  BP (!) 114/93   Pulse 69   Resp 18   Ht '5\' 2"'$  (1.575 m)   Wt 209 lb (94.8 kg)   BMI 38.23 kg/m  Lungs are clear to A&P cardiac examination essentially unremarkable with regular rate and rhythm. No dominant mass or nodularity is noted in either breast in 2 positions examined. Incision is well-healed. No axillary or supraclavicular adenopathy is appreciated. Cosmetic result is excellent.  Well-developed well-nourished patient in NAD. HEENT reveals PERLA, EOMI, discs not visualized.  Oral cavity is clear. No oral mucosal lesions are identified. Neck is clear without evidence of cervical or supraclavicular adenopathy. Lungs are clear to A&P. Cardiac examination is essentially unremarkable with regular rate and rhythm without murmur rub or thrill. Abdomen is benign with no organomegaly or masses noted. Motor sensory and DTR levels are equal and symmetric in the upper and lower extremities. Cranial nerves II through XII are grossly intact. Proprioception is intact. No peripheral adenopathy or edema is identified. No motor or sensory  levels are noted. Crude visual fields are within normal range.  RADIOLOGY RESULTS: Mammograms reviewed compatible with above-stated findings  PLAN: The present time patient is now at close to 5 years with no evidence of disease.  I am going to discontinue follow-up care.  Patient continues follow-up care with medical oncology.  Patient is to call with any concerns at any time.  I would like to take this opportunity to thank you for allowing me to participate in the care of your patient.Noreene Filbert, MD

## 2021-12-24 DIAGNOSIS — I10 Essential (primary) hypertension: Secondary | ICD-10-CM | POA: Diagnosis not present

## 2021-12-24 DIAGNOSIS — G4733 Obstructive sleep apnea (adult) (pediatric): Secondary | ICD-10-CM | POA: Diagnosis not present

## 2021-12-30 DIAGNOSIS — I2510 Atherosclerotic heart disease of native coronary artery without angina pectoris: Secondary | ICD-10-CM | POA: Diagnosis not present

## 2021-12-30 DIAGNOSIS — I129 Hypertensive chronic kidney disease with stage 1 through stage 4 chronic kidney disease, or unspecified chronic kidney disease: Secondary | ICD-10-CM | POA: Diagnosis not present

## 2021-12-30 DIAGNOSIS — Z7901 Long term (current) use of anticoagulants: Secondary | ICD-10-CM | POA: Diagnosis not present

## 2021-12-30 DIAGNOSIS — F3340 Major depressive disorder, recurrent, in remission, unspecified: Secondary | ICD-10-CM | POA: Diagnosis not present

## 2021-12-30 DIAGNOSIS — I480 Paroxysmal atrial fibrillation: Secondary | ICD-10-CM | POA: Diagnosis not present

## 2021-12-30 DIAGNOSIS — Z8679 Personal history of other diseases of the circulatory system: Secondary | ICD-10-CM | POA: Diagnosis not present

## 2021-12-30 DIAGNOSIS — E1122 Type 2 diabetes mellitus with diabetic chronic kidney disease: Secondary | ICD-10-CM | POA: Diagnosis not present

## 2021-12-30 DIAGNOSIS — E1151 Type 2 diabetes mellitus with diabetic peripheral angiopathy without gangrene: Secondary | ICD-10-CM | POA: Diagnosis not present

## 2021-12-30 DIAGNOSIS — N184 Chronic kidney disease, stage 4 (severe): Secondary | ICD-10-CM | POA: Diagnosis not present

## 2021-12-30 DIAGNOSIS — Z7984 Long term (current) use of oral hypoglycemic drugs: Secondary | ICD-10-CM | POA: Diagnosis not present

## 2022-01-07 ENCOUNTER — Ambulatory Visit: Payer: Self-pay

## 2022-01-07 NOTE — Patient Instructions (Signed)
Visit Information  Thank you for taking time to visit with me today. Please don't hesitate to contact me if I can be of assistance to you.   Following are the goals we discussed today:   Goals Addressed             This Visit's Progress    RNCM: Effective Management of CKD       Care Coordination Interventions: BP Readings from Last 3 Encounters:  12/22/21 (!) 114/93  09/15/21 (!) 146/54  09/02/21 (!) 127/52    Lab Results  Component Value Date   CREATININE 2.52 (H) 09/13/2021   BUN 40 (H) 09/13/2021   NA 135 09/13/2021   K 5.1 09/13/2021   CL 111 09/13/2021   CO2 21 (L) 09/13/2021   GFR: 30 from labs from nephrology 10-07-2021  Assessed the Patient understanding of chronic kidney disease. The patient sees the nephrologist every 4 months. She is mindful of how her CKD can change and this is her biggest concern. She checks her blood sugars BID and usually it is around 100. Does have some episodes of hypoglycemia but knows how to treat. Last A1C was 5.6 in July 2023.   Denies any new concerns with CKD today. Will follow up with the nephrologist in December.  Evaluation of current treatment plan related to chronic kidney disease self management and patient's adherence to plan as established by provider. Sees pcp and nephrologist on a regular basis. Is compliant with the plan of care.   Calls for changes, questions, or concerns Provided education to patient re: stroke prevention, s/s of heart attack and stroke    Reviewed prescribed diet heart healthy/ADA/Renal.  Mindful of dietary restrictions.  Reviewed medications with patient and discussed importance of compliance.  Is compliant with medications. Does get assistance for Rybelsus. Also working with specialist to get Eliquis. Review of pharmacy resources with [facility] and the pharm D availability   Counseled on adverse effects of illicit drug and excessive alcohol use in patients with chronic kidney disease    Counseled on the importance of  exercise goals with target of 150 minutes per week. 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 She is also doing exercises at home. Uses a cane when she is out and about. Denies any issues with falls or safety concerns   Advised patient, providing education and rationale, to monitor blood pressure daily and record, calling PCP for findings outside established parameters    Discussed complications of poorly controlled blood pressure such as heart disease, stroke, circulatory complications, vision complications, kidney impairment, sexual dysfunction    Reviewed scheduled/upcoming provider appointments including: Saw pcp on 12-10-2021 and will follow up as needed. Sees nephrologist again in December  Advised patient to discuss changes in kidney function, questions and concerns with provider    Discussed plans with patient for ongoing care management follow up and provided patient with direct contact information for care management team    Screening for signs and symptoms of depression related to chronic disease state      Discussed the impact of chronic kidney disease on daily life and mental health and acknowledged and normalized feelings of disempowerment, fear, and frustration    Assessed social determinant of health barriers    Provided education on kidney disease progression    Engage patient in early, proactive and ongoing discussion about goals of care and what matters most to them    Support  coping and stress management by recognizing current strategies and assist in developing new strategies such as mindfulness, journaling, relaxation techniques, problem-solving         RNCM: Management of knee pain       Care Coordination Interventions: Reviewed provider established plan for pain management. The patient had her knee replacement surgery and is doing very well. Is not even having to use her cane or her walker. Likely close this goal on  next outreach.  Discussed importance of adherence to all scheduled medical appointments. Sees pcp on a regular basis and specialist when needed. The patient knows to call the [facility] for new concerns or changes. Will continue to monitor and support with new concerns or needs Counseled on the importance of reporting any/all new or changed pain symptoms or management strategies to pain management provider Advised patient to report to care team affect of pain on daily activities. The patient is happy to report that her knee replacement was successful and she feels great. Discussed use of relaxation techniques and/or diversional activities to assist with pain reduction (distraction, imagery, relaxation, massage, acupressure, TENS, heat, and cold application Reviewed with patient prescribed pharmacological and nonpharmacological pain relief strategies. Patient takes Tylenol for pain relief when needed  Advised patient to discuss changes in level or intensity of pain, or unresolved pain with provider Screening for signs and symptoms of depression related to chronic disease state  Assessed social determinant of health barriers AWV completed on 06-05-2021           Our next appointment is by telephone on 02-18-2022 at 130 pm  Please call the care guide team at [phone] if you need to cancel or reschedule your appointment.   If you are experiencing a Mental Health or [place] or need someone to talk to, please call the Suicide and Crisis Lifeline: 988 call the [place] National Suicide Prevention Lifeline: 724-142-2192 or TTY: (864) 567-3949 TTY 435 181 2287) to talk to a trained counselor call 1-800-273-TALK (toll free, 24 hour hotline)  Patient verbalizes understanding of instructions and care plan provided today and agrees to view in [place]. Active MyChart status and patient understanding of how to access instructions and care plan via MyChart confirmed with patient.     Telephone  follow up appointment with care management team member scheduled for: 02-18-2022 at 130 pm  [place], MSN, [employer]  Mobile: [phone]

## 2022-01-07 NOTE — Patient Outreach (Signed)
Care Coordination   Follow Up Visit Note   01/07/2022 Name: Bonnie Holt MRN: [id number] DOB: 03/17/1945  Bonnie Holt is a 76 y.o. year old female who sees Kirk Ruths, MD for primary care. I spoke with  [name] by phone today.  What matters to the patients health and wellness today?  Excited to tell RNCM that her knee surgery was successful. Monitoring and effective management of her CKD    Goals Addressed             This Visit's Progress    RNCM: Effective Management of CKD       Care Coordination Interventions: BP Readings from Last 3 Encounters:  12/22/21 (!) 114/93  09/15/21 (!) 146/54  09/02/21 (!) 127/52    Lab Results  Component Value Date   CREATININE 2.52 (H) 09/13/2021   BUN 40 (H) 09/13/2021   NA 135 09/13/2021   K 5.1 09/13/2021   CL 111 09/13/2021   CO2 21 (L) 09/13/2021   GFR: 30 from labs from nephrology 10-07-2021  Assessed the Patient understanding of chronic kidney disease. The patient sees the nephrologist every 4 months. She is mindful of how her CKD can change and this is her biggest concern. She checks her blood sugars BID and usually it is around 100. Does have some episodes of hypoglycemia but knows how to treat. Last A1C was 5.6 in July 2023.   Denies any new concerns with CKD today. Will follow up with the nephrologist in December.  Evaluation of current treatment plan related to chronic kidney disease self management and patient's adherence to plan as established by provider. Sees pcp and nephrologist on a regular basis. Is compliant with the plan of care.   Calls for changes, questions, or concerns Provided education to patient re: stroke prevention, s/s of heart attack and stroke    Reviewed prescribed diet heart healthy/ADA/Renal.  Mindful of dietary restrictions.  Reviewed medications with patient and discussed importance of compliance.  Is compliant with medications. Does get assistance for Rybelsus. Also working with  specialist to get Eliquis. Review of pharmacy resources with [facility] and the pharm D availability   Counseled on adverse effects of illicit drug and excessive alcohol use in patients with chronic kidney disease    Counseled on the importance of exercise goals with target of 150 minutes per week. 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 She is also doing exercises at home. Uses a cane when she is out and about. Denies any issues with falls or safety concerns   Advised patient, providing education and rationale, to monitor blood pressure daily and record, calling PCP for findings outside established parameters    Discussed complications of poorly controlled blood pressure such as heart disease, stroke, circulatory complications, vision complications, kidney impairment, sexual dysfunction    Reviewed scheduled/upcoming provider appointments including: Saw pcp on 12-10-2021 and will follow up as needed. Sees nephrologist again in December  Advised patient to discuss changes in kidney function, questions and concerns with provider    Discussed plans with patient for ongoing care management follow up and provided patient with direct contact information for care management team    Screening for signs and symptoms of depression related to chronic disease state      Discussed the impact of chronic kidney disease on daily life and mental health and acknowledged and normalized feelings of disempowerment, fear, and frustration  Assessed social determinant of health barriers    Provided education on kidney disease progression    Engage patient in early, proactive and ongoing discussion about goals of care and what matters most to them    Support coping and stress management by recognizing current strategies and assist in developing new strategies such as mindfulness, journaling, relaxation techniques, problem-solving         RNCM: Management of knee pain        Care Coordination Interventions: Reviewed provider established plan for pain management. The patient had her knee replacement surgery and is doing very well. Is not even having to use her cane or her walker. Likely close this goal on next outreach.  Discussed importance of adherence to all scheduled medical appointments. Sees pcp on a regular basis and specialist when needed. The patient knows to call the [facility] for new concerns or changes. Will continue to monitor and support with new concerns or needs Counseled on the importance of reporting any/all new or changed pain symptoms or management strategies to pain management provider Advised patient to report to care team affect of pain on daily activities. The patient is happy to report that her knee replacement was successful and she feels great. Discussed use of relaxation techniques and/or diversional activities to assist with pain reduction (distraction, imagery, relaxation, massage, acupressure, TENS, heat, and cold application Reviewed with patient prescribed pharmacological and nonpharmacological pain relief strategies. Patient takes Tylenol for pain relief when needed  Advised patient to discuss changes in level or intensity of pain, or unresolved pain with provider Screening for signs and symptoms of depression related to chronic disease state  Assessed social determinant of health barriers AWV completed on 06-05-2021           SDOH assessments and interventions completed:  Yes  SDOH Interventions Today    Flowsheet Row Most Recent Value  SDOH Interventions   Utilities Interventions Intervention Not Indicated        Care Coordination Interventions Activated:  Yes  Care Coordination Interventions:  Yes, provided   Follow up plan: Follow up call scheduled for 02-18-2022 at 130 pm    Encounter Outcome:  Pt. Visit Completed

## 2022-01-09 DIAGNOSIS — C44529 Squamous cell carcinoma of skin of other part of trunk: Secondary | ICD-10-CM | POA: Diagnosis not present

## 2022-01-09 DIAGNOSIS — D2261 Melanocytic nevi of right upper limb, including shoulder: Secondary | ICD-10-CM | POA: Diagnosis not present

## 2022-01-09 DIAGNOSIS — D2262 Melanocytic nevi of left upper limb, including shoulder: Secondary | ICD-10-CM | POA: Diagnosis not present

## 2022-01-09 DIAGNOSIS — D225 Melanocytic nevi of trunk: Secondary | ICD-10-CM | POA: Diagnosis not present

## 2022-01-09 DIAGNOSIS — L821 Other seborrheic keratosis: Secondary | ICD-10-CM | POA: Diagnosis not present

## 2022-01-09 DIAGNOSIS — X32XXXA Exposure to sunlight, initial encounter: Secondary | ICD-10-CM | POA: Diagnosis not present

## 2022-01-09 DIAGNOSIS — L570 Actinic keratosis: Secondary | ICD-10-CM | POA: Diagnosis not present

## 2022-01-09 DIAGNOSIS — L538 Other specified erythematous conditions: Secondary | ICD-10-CM | POA: Diagnosis not present

## 2022-01-09 DIAGNOSIS — D485 Neoplasm of uncertain behavior of skin: Secondary | ICD-10-CM | POA: Diagnosis not present

## 2022-01-19 DIAGNOSIS — C44529 Squamous cell carcinoma of skin of other part of trunk: Secondary | ICD-10-CM | POA: Diagnosis not present

## 2022-01-22 DIAGNOSIS — Z01419 Encounter for gynecological examination (general) (routine) without abnormal findings: Secondary | ICD-10-CM | POA: Diagnosis not present

## 2022-01-22 DIAGNOSIS — Z124 Encounter for screening for malignant neoplasm of cervix: Secondary | ICD-10-CM | POA: Diagnosis not present

## 2022-01-22 DIAGNOSIS — N898 Other specified noninflammatory disorders of vagina: Secondary | ICD-10-CM | POA: Diagnosis not present

## 2022-01-23 DIAGNOSIS — G4733 Obstructive sleep apnea (adult) (pediatric): Secondary | ICD-10-CM | POA: Diagnosis not present

## 2022-01-23 DIAGNOSIS — I10 Essential (primary) hypertension: Secondary | ICD-10-CM | POA: Diagnosis not present

## 2022-02-03 DIAGNOSIS — R221 Localized swelling, mass and lump, neck: Secondary | ICD-10-CM | POA: Diagnosis not present

## 2022-02-09 DIAGNOSIS — R600 Localized edema: Secondary | ICD-10-CM | POA: Diagnosis not present

## 2022-02-09 DIAGNOSIS — N1832 Chronic kidney disease, stage 3b: Secondary | ICD-10-CM | POA: Diagnosis not present

## 2022-02-09 DIAGNOSIS — E1129 Type 2 diabetes mellitus with other diabetic kidney complication: Secondary | ICD-10-CM | POA: Diagnosis not present

## 2022-02-09 DIAGNOSIS — R8290 Unspecified abnormal findings in urine: Secondary | ICD-10-CM | POA: Diagnosis not present

## 2022-02-09 DIAGNOSIS — I10 Essential (primary) hypertension: Secondary | ICD-10-CM | POA: Diagnosis not present

## 2022-02-09 DIAGNOSIS — N2581 Secondary hyperparathyroidism of renal origin: Secondary | ICD-10-CM | POA: Diagnosis not present

## 2022-02-16 DIAGNOSIS — E1122 Type 2 diabetes mellitus with diabetic chronic kidney disease: Secondary | ICD-10-CM | POA: Diagnosis not present

## 2022-02-16 DIAGNOSIS — N2581 Secondary hyperparathyroidism of renal origin: Secondary | ICD-10-CM | POA: Diagnosis not present

## 2022-02-16 DIAGNOSIS — I480 Paroxysmal atrial fibrillation: Secondary | ICD-10-CM | POA: Diagnosis not present

## 2022-02-16 DIAGNOSIS — N184 Chronic kidney disease, stage 4 (severe): Secondary | ICD-10-CM | POA: Diagnosis not present

## 2022-02-16 DIAGNOSIS — E1129 Type 2 diabetes mellitus with other diabetic kidney complication: Secondary | ICD-10-CM | POA: Diagnosis not present

## 2022-02-16 DIAGNOSIS — I25119 Atherosclerotic heart disease of native coronary artery with unspecified angina pectoris: Secondary | ICD-10-CM | POA: Diagnosis not present

## 2022-02-16 DIAGNOSIS — I10 Essential (primary) hypertension: Secondary | ICD-10-CM | POA: Diagnosis not present

## 2022-02-18 ENCOUNTER — Ambulatory Visit: Payer: Self-pay | Admitting: *Deleted

## 2022-02-18 NOTE — Patient Outreach (Signed)
Care Coordination   Follow Up Visit Note   02/18/2022 Name: Bonnie Holt MRN: [id number] DOB: 1945-07-18  Bonnie Holt is a 76 y.o. year old female who sees Kirk Ruths, MD for primary care. I spoke with  Bonnie Holt by phone today.  What matters to the patients health and wellness today?  Knee issues resolved, continue to manage diabetes as well as CKD & HTN.  Neck cyst removal scheduled for 1/19   Goals Addressed             This Visit's Progress    RNCM: Effective Management of CKD   On track    Care Coordination Interventions: BP Readings from Last 3 Encounters:  12/22/21 (!) 114/93  09/15/21 (!) 146/54  09/02/21 (!) 127/52   Cr - 2.2 and GFR 23  Assessed the Patient understanding of chronic kidney disease. The patient sees the nephrologist every 4 months. She checks her blood sugars BID ranges 100s. Does have some episodes of hypoglycemia but knows how to treat. Last A1C was 6 in Sept.   Denies any new concerns with CKD today. Evaluation of current treatment plan related to chronic kidney disease self management and patient's adherence to plan as established by provider. Sees pcp and nephrologist on a regular basis. Is compliant with the plan of care.   Calls for changes, questions, or concerns Provided education to patient re: stroke prevention, s/s of heart attack and stroke    Reviewed prescribed diet heart healthy/ADA/Renal.  Mindful of dietary restrictions.  Reviewed medications with patient and discussed importance of compliance.    Advised patient, providing education and rationale, to monitor blood pressure daily and record, calling PCP for findings outside established parameters .  Report range [id number] Discussed complications of poorly controlled blood pressure such as heart disease, stroke, circulatory complications, vision complications, kidney impairment, sexual dysfunction    Reviewed scheduled/upcoming provider appointments including: Saw pcp  on 12-10-2021 and will follow up in March Advised patient to discuss changes in kidney function, questions and concerns with provider    Discussed plans with patient for ongoing care management follow up and provided patient with direct contact information for care management team    Engage patient in early, proactive and ongoing discussion about goals of care and what matters most to them    Support coping and stress management by recognizing current strategies and assist in developing new strategies such as mindfulness, journaling, relaxation techniques, problem-solving         COMPLETED: RNCM: Management of knee pain   On track    Care Coordination Interventions: Reviewed provider established plan for pain management. The patient had her knee replacement surgery and is doing very well. Is not even having to use her cane or her walker. Likely close this goal on next outreach.  Discussed importance of adherence to all scheduled medical appointments. Sees pcp on a regular basis and specialist when needed. The patient knows to call the [facility] for new concerns or changes. Will continue to monitor and support with new concerns or needs Counseled on the importance of reporting any/all new or changed pain symptoms or management strategies to pain management provider Advised patient to report to care team affect of pain on daily activities. The patient is happy to report that her knee replacement was successful and she feels great. Discussed use of relaxation techniques and/or diversional activities to assist with pain reduction (distraction, imagery, relaxation, massage, acupressure, TENS, heat, and cold application Reviewed  with patient prescribed pharmacological and nonpharmacological pain relief strategies. Patient takes Tylenol for pain relief when needed  Advised patient to discuss changes in level or intensity of pain, or unresolved pain with provider Screening for signs and symptoms of depression  related to chronic disease state  Assessed social determinant of health barriers AWV completed on 06-05-2021   12/6 - No further issues with knee          SDOH assessments and interventions completed:  No     Care Coordination Interventions:  Yes, provided   Follow up plan: Follow up call scheduled for 1/23    Encounter Outcome:  Pt. Visit Completed   Valente David, RN, MSN, [place] Care Management Care Management Coordinator [phone]

## 2022-02-18 NOTE — Patient Instructions (Signed)
Visit Information  Thank you for taking time to visit with me today. Please don't hesitate to contact me if I can be of assistance to you before our next scheduled telephone appointment.  Following are the goals we discussed today:  Continue daily monitoring of blood sugar and blood pressure. Notify this RNCM with questions regarding cyst removal  Our next appointment is by telephone on 1/23  Please call the care guide team at [phone] if you need to cancel or reschedule your appointment.   Please call the Suicide and Crisis Lifeline: 988 call the [place] National Suicide Prevention Lifeline: 7438271691 or TTY: (929)371-4544 TTY (410) 260-9606) to talk to a trained counselor call 1-800-273-TALK (toll free, 24 hour hotline) call 911 if you are experiencing a Mental Health or [place] or need someone to talk to.  Patient verbalizes understanding of instructions and care plan provided today and agrees to view in [facility] [place]. Active MyChart status and patient understanding of how to access instructions and care plan via MyChart confirmed with patient.     The patient has been provided with contact information for the care management team and has been advised to call with any health related questions or concerns.   Valente David, RN, MSN, [place] Care Management Care Management Coordinator [phone]

## 2022-03-03 DIAGNOSIS — I10 Essential (primary) hypertension: Secondary | ICD-10-CM | POA: Diagnosis not present

## 2022-03-03 DIAGNOSIS — G4733 Obstructive sleep apnea (adult) (pediatric): Secondary | ICD-10-CM | POA: Diagnosis not present

## 2022-03-18 DIAGNOSIS — I10 Essential (primary) hypertension: Secondary | ICD-10-CM | POA: Diagnosis not present

## 2022-03-18 DIAGNOSIS — I480 Paroxysmal atrial fibrillation: Secondary | ICD-10-CM | POA: Diagnosis not present

## 2022-03-18 DIAGNOSIS — N184 Chronic kidney disease, stage 4 (severe): Secondary | ICD-10-CM | POA: Diagnosis not present

## 2022-03-18 DIAGNOSIS — I25119 Atherosclerotic heart disease of native coronary artery with unspecified angina pectoris: Secondary | ICD-10-CM | POA: Diagnosis not present

## 2022-03-18 DIAGNOSIS — E1122 Type 2 diabetes mellitus with diabetic chronic kidney disease: Secondary | ICD-10-CM | POA: Diagnosis not present

## 2022-03-25 ENCOUNTER — Other Ambulatory Visit: Payer: Self-pay

## 2022-03-25 ENCOUNTER — Encounter: Payer: Self-pay | Admitting: Anesthesiology

## 2022-03-26 ENCOUNTER — Encounter: Payer: Self-pay | Admitting: Oncology

## 2022-03-26 ENCOUNTER — Inpatient Hospital Stay: Payer: PPO | Attending: Oncology | Admitting: Oncology

## 2022-03-26 VITALS — BP 102/53 | HR 64 | Temp 97.8°F | Resp 18 | Wt 210.5 lb

## 2022-03-26 DIAGNOSIS — C50412 Malignant neoplasm of upper-outer quadrant of left female breast: Secondary | ICD-10-CM | POA: Insufficient documentation

## 2022-03-26 DIAGNOSIS — M25569 Pain in unspecified knee: Secondary | ICD-10-CM | POA: Diagnosis not present

## 2022-03-26 DIAGNOSIS — Z79811 Long term (current) use of aromatase inhibitors: Secondary | ICD-10-CM | POA: Insufficient documentation

## 2022-03-26 DIAGNOSIS — R221 Localized swelling, mass and lump, neck: Secondary | ICD-10-CM | POA: Diagnosis not present

## 2022-03-26 DIAGNOSIS — G8929 Other chronic pain: Secondary | ICD-10-CM | POA: Insufficient documentation

## 2022-03-26 DIAGNOSIS — M8580 Other specified disorders of bone density and structure, unspecified site: Secondary | ICD-10-CM | POA: Insufficient documentation

## 2022-03-26 DIAGNOSIS — Z170 Estrogen receptor positive status [ER+]: Secondary | ICD-10-CM | POA: Insufficient documentation

## 2022-03-26 NOTE — Progress Notes (Signed)
Pt has had a cold. Headaches have gone away. She states she was sick since [place]. Appetite is lower then normal. Energy is low. No hot flashes. Taking letrozole.

## 2022-03-26 NOTE — Progress Notes (Signed)
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  INTERVAL HISTORY: Patient returns to clinic today for routine 65-monthevaluation.  She continues to feel well and remains asymptomatic.  She is tolerating letrozole without significant side effects. She has no neurologic complaints.  She denies any recent fevers or illnesses.  She has a good appetite and denies weight loss.  She denies any chest pain, shortness of breath, cough, or hemoptysis.  She denies any nausea, vomiting, constipation, or diarrhea. She has no urinary complaints.  Patient offers no specific complaints today.  REVIEW OF SYSTEMS:   Review of Systems  Constitutional: Negative.  Negative for fever, malaise/fatigue and weight loss.  Respiratory: Negative.  Negative for cough, hemoptysis and shortness of breath.   Cardiovascular: Negative.  Negative for chest pain and leg swelling.  Gastrointestinal: Negative.  Negative for abdominal pain, blood in stool and melena.  Genitourinary: Negative.  Negative for dysuria and hematuria.  Musculoskeletal:  Positive for joint pain. Negative for back pain.  Skin: Negative.  Negative for rash.  Neurological: Negative.  Negative for dizziness, sensory change, weakness and headaches.  Psychiatric/Behavioral: Negative.  The patient is not nervous/anxious.     As per HPI. Otherwise, a complete review of systems is negative.  PAST MEDICAL HISTORY: Past Medical History:  Diagnosis Date   Arthritis    ASCUS with positive high risk HPV cervical    Basal cell carcinoma of  skin    Bradycardia    Breast cancer, left ([facility] 11/19/2016   a.) stage IA (cT1b, cN0, cM0, G2, ER+, PR+, HER2-); OncotypeDx score = 6; s/p lumpectomy 12/16/2016 + adjuvant XRT (completed 03/20/2017) + 5 years endocrine (letrozole) therapy (completes 03/2022)   Chronic ischemic heart disease    CKD (chronic kidney disease), stage IV (HCC)    Coronary artery disease    a.) LHC/PCI in 2012 in [place]; BMS x 1 placed (unknown type/location)   Diastolic dysfunction 041/66/0630  a.) TTE 06/28/2017: EF 50%, mild LVH, mild LAE, triv PR, mild MR/TR, G1DD.   HLD (hyperlipidemia)    Hypertension    Left bundle branch block (LBBB)    Long term current use of anticoagulant    a.) apixaban   Long term current use of aromatase inhibitor    a.) letrozole; completes 5 years of therapy in 03/2022   OSA treated with BiPAP    Osteopenia    PAF (paroxysmal atrial fibrillation) (HCC)    a.) CHA2DS2-VASc = 5 (age x2, sex, HTN, T2DM); b.) rate/rhythm maintained on oral amiodarone + metoprolol succinate; chronically anticoagulated using standard dose apixaban   Personal history of radiation therapy    a.) for LEFT breast cancer; completed 03/2017   Tick fever    Type 2 diabetes mellitus treated with insulin ([facility]    a.) has CGM in place    PAST SURGICAL HISTORY: Past Surgical History:  Procedure Laterality Date   BREAST BIOPSY Left 11/19/2016   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   BREAST BIOPSY Left 11/19/2016   wing clip 2:30 4 cmfn Invasive mammary carcinoma   BREAST BIOPSY Left 11/19/2016  ribbon clip 3:00 6cmfn Invasive mammary carcinoma   BREAST BIOPSY Left 12/03/2016   LN biopsy, negative   BREAST BIOPSY Left 11/17/2017   affirm bx of calcs, x clip -FIBROSIS WITH DYSTROPHIC CALCIFICATIONS   BREAST LUMPECTOMY Left 12/16/2016   excision of all 3 sites, LN negative   CORONARY ANGIOPLASTY WITH STENT PLACEMENT Left 2012   [employer]   DIALYSIS/PERMA CATHETER INSERTION N/A 09/09/2017   Procedure:  DIALYSIS/PERMA CATHETER INSERTION;  Surgeon: Algernon Huxley, MD;  Location: [facility];  Service: Cardiovascular;  Laterality: N/A;   DIALYSIS/PERMA CATHETER REMOVAL N/A 10/04/2017   Procedure: DIALYSIS/PERMA CATHETER REMOVAL;  Surgeon: Algernon Huxley, MD;  Location: [facility];  Service: Cardiovascular;  Laterality: N/A;   ESOPHAGOGASTRODUODENOSCOPY (EGD) WITH PROPOFOL N/A 09/07/2017   Procedure: ESOPHAGOGASTRODUODENOSCOPY (EGD) WITH PROPOFOL;  Surgeon: Lucilla Lame, MD;  Location: [facility];  Service: Endoscopy;  Laterality: N/A;   KNEE ARTHROPLASTY Left 09/12/2021   Procedure: COMPUTER ASSISTED TOTAL KNEE ARTHROPLASTY;  Surgeon: Dereck Leep, MD;  Location: [facility];  Service: Orthopedics;  Laterality: Left;   PARTIAL MASTECTOMY WITH NEEDLE LOCALIZATION Left 12/16/2016   Procedure: PARTIAL MASTECTOMY WITH NEEDLE LOCALIZATION;  Surgeon: Herbert Pun, MD;  Location: [facility];  Service: General;  Laterality: Left;   SENTINEL NODE BIOPSY Left 12/16/2016   Procedure: SENTINEL NODE BIOPSY;  Surgeon: Herbert Pun, MD;  Location: [facility];  Service: General;  Laterality: Left;    FAMILY HISTORY: Family History  Problem Relation Age of Onset   Brain cancer Father    Diabetes Father    Hypertension Brother    Heart Problems Maternal Aunt    Diabetes Paternal Aunt    Heart Problems Maternal Grandmother    Dementia Paternal Grandmother    Heart Problems Brother    Hypertension Brother    Asthma Maternal Aunt    Arthritis Maternal Aunt    Diabetes Paternal Aunt    Cancer Paternal Uncle    Breast cancer Neg Hx     ADVANCED DIRECTIVES (Y/N):  N  HEALTH MAINTENANCE: Social History   Tobacco Use   Smoking status: Never   Smokeless tobacco: Never  Vaping Use   Vaping Use: Never used  Substance Use Topics   Alcohol use: Yes    Comment: socially -  2 per year   Drug use: No     Colonoscopy:  PAP:  Bone density:  Lipid panel:  Allergies   Allergen Reactions   Levofloxacin Swelling    Swelling of joints Swelling of joints    Ace Inhibitors Cough   Warfarin Other (See Comments) and Swelling    weakness    Current Outpatient Medications  Medication Sig Dispense Refill   acetaminophen (TYLENOL) 325 MG tablet Place 2 tablets (650 mg total) into feeding tube every 6 (six) hours as needed for mild pain (or Fever >/= 101).     apixaban (ELIQUIS) 5 MG TABS tablet Take 5 mg by mouth 2 (two) times daily.     atorvastatin (LIPITOR) 80 MG tablet Take 1 tablet by mouth daily.   11   calcium citrate-vitamin D (CITRACAL+D) 315-200 MG-UNIT tablet Take 1 tablet by mouth daily.     Cholecalciferol (VITAMIN D-3) 5000 units TABS Take 1 tablet by mouth daily.      Coenzyme Q10 (COQ10 PO) Take 1 tablet by mouth daily.     Continuous Blood Gluc Sensor (FREESTYLE LIBRE 14 DAY SENSOR) MISC Use 1 kit every 14 (fourteen) days  Flaxseed, Linseed, (FLAXSEED OIL) 1200 MG CAPS Take 1 capsule by mouth daily.     glipiZIDE (GLUCOTROL) 5 MG tablet Take 5 mg by mouth daily.     letrozole (FEMARA) 2.5 MG tablet Take 1 tablet by mouth daily.     losartan (COZAAR) 25 MG tablet Take 25 mg by mouth daily.     metoprolol succinate (TOPROL-XL) 50 MG 24 hr tablet Take 50 mg by mouth daily.      multivitamin (RENA-VIT) TABS tablet Take 1 tablet by mouth at bedtime.  0   Semaglutide 14 MG TABS Take 1 tablet by mouth daily. Am before other meds or before eating     torsemide (DEMADEX) 20 MG tablet Take 1 tablet by mouth daily as needed.  3   traMADol (ULTRAM) 50 MG tablet Take 1-2 tablets (50-100 mg total) by mouth every 4 (four) hours as needed for moderate pain. 30 tablet 0   No current facility-administered medications for this visit.    OBJECTIVE: Vitals:   03/26/22 1406  BP: (!) 102/53  Pulse: 64  Resp: 18  Temp: 97.8 F (36.6 C)  SpO2: 100%     Body mass index is 38.5 kg/m.    ECOG FS:0 - Asymptomatic  General: Well-developed, well-nourished,  no acute distress. Eyes: Pink conjunctiva, anicteric sclera. HEENT: Normocephalic, moist mucous membranes. Breast: Exam deferred today. Lungs: No audible wheezing or coughing. Heart: Regular rate and rhythm. Abdomen: Soft, nontender, no obvious distention. Musculoskeletal: No edema, cyanosis, or clubbing. Neuro: Alert, answering all questions appropriately. Cranial nerves grossly intact. Skin: No rashes or petechiae noted. Psych: Normal affect.   LAB RESULTS:  Lab Results  Component Value Date   NA 135 09/13/2021   K 5.1 09/13/2021   CL 111 09/13/2021   CO2 21 (L) 09/13/2021   GLUCOSE 205 (H) 09/13/2021   BUN 40 (H) 09/13/2021   CREATININE 2.52 (H) 09/13/2021   CALCIUM 8.4 (L) 09/13/2021   PROT 7.1 09/02/2021   ALBUMIN 3.7 09/02/2021   AST 32 09/02/2021   ALT 34 09/02/2021   ALKPHOS 95 09/02/2021   BILITOT 1.3 (H) 09/02/2021   GFRNONAA 19 (L) 09/13/2021   GFRAA 9 (L) 09/15/2017    Lab Results  Component Value Date   WBC 6.8 09/12/2021   NEUTROABS 4.6 09/08/2017   HGB 12.0 09/12/2021   HCT 37.2 09/12/2021   MCV 98.4 09/12/2021   PLT 165 09/12/2021     STUDIES: No results found.  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  Oncotype DX 6, low risk.  PLAN:    1. 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 She also had a lymph node biopsy that was negative for disease.  Patient had her lumpectomy on December 16, 2016.  Because of her low risk Oncotype, she proceeded directly to adjuvant XRT which she completed on March 20, 2017.  Patient has now completed 5 years of treatment with letrozole and has been instructed to discontinue when she completes her current prescription.  Her most recent  mammogram on December 17, 2021 was reported as BI-RADS 1.  Repeat in October 2024.  After lengthy discussion with the patient, it was determined that no further follow-up is necessary and her primary care physician can continue ordering her yearly  screening mammograms.  No further follow-up has been scheduled.  Please refer patient back if there are any questions or concerns.   2.  Osteopenia: Patient's most recent bone mineral density on December 17, 2021 was reported T-score of -2.3.  This is slightly worse than 1 year prior where her T-score was reported at -2.1.  No intervention is needed at this time.  Continue calcium and vitamin D supplementation.  Repeat in October 2024 along with screening mammogram.  These can now be ordered by primary care physician.   3.  Knee pain: Chronic and unchanged. continue follow-up and treatment per orthopedics.   Patient expressed understanding and was in agreement with this plan. She also understands that She can call clinic at any time with any questions, concerns, or complaints.    Cancer Staging  Primary cancer of upper outer quadrant of left female breast [facility]) Staging form: Breast, AJCC 8th Edition - Clinical stage from 12/06/2016: Stage IA (cT1b, cN0, cM0, G2, ER+, PR+, HER2-) - Signed by Lloyd Huger, MD on 12/06/2016 Histologic grading system: 3 grade system Laterality: Left   Lloyd Huger, MD   03/27/2022 7:09 AM

## 2022-04-02 DIAGNOSIS — I10 Essential (primary) hypertension: Secondary | ICD-10-CM | POA: Diagnosis not present

## 2022-04-02 DIAGNOSIS — G4733 Obstructive sleep apnea (adult) (pediatric): Secondary | ICD-10-CM | POA: Diagnosis not present

## 2022-04-03 ENCOUNTER — Ambulatory Visit: Admit: 2022-04-03 | Payer: PPO | Admitting: Unknown Physician Specialty

## 2022-04-03 DIAGNOSIS — Z79899 Other long term (current) drug therapy: Secondary | ICD-10-CM

## 2022-04-03 DIAGNOSIS — Z01812 Encounter for preprocedural laboratory examination: Secondary | ICD-10-CM

## 2022-04-03 SURGERY — EXCISION MASS
Anesthesia: IV Sedation (MBSC Only) | Laterality: Right

## 2022-04-07 ENCOUNTER — Ambulatory Visit: Payer: Self-pay | Admitting: *Deleted

## 2022-04-07 NOTE — Patient Outreach (Signed)
  Care Coordination   Follow Up Visit Note   04/07/2022 Name: Bonnie Holt MRN: [id number] DOB: 1945-09-07  Bonnie Holt is a 77 y.o. year old female who sees Kirk Ruths, MD for primary care. I spoke with  [name] by phone today.  What matters to the patients health and wellness today?  Surgery for cyst removal canceled as it erupted.  She has not had any issues from this, no infection or complications.  Denies need for ongoing follow up at this time, will call this RNCM with questions.      Goals Addressed             This Visit's Progress    COMPLETED: RNCM: Effective Management of CKD   On track    Care Coordination Interventions: BP Readings from Last 3 Encounters:  03/26/22 (!) 102/53  12/22/21 (!) 114/93  09/15/21 (!) 146/54   Cr - 2.2 and GFR 23  Assessed the Patient understanding of chronic kidney disease. The patient sees the nephrologist every 4 months. She checks her blood sugars BID ranges 100s. Does have some episodes of hypoglycemia but knows how to treat. Last A1C was 6 in Sept.   Denies any new concerns with CKD today. Evaluation of current treatment plan related to chronic kidney disease self management and patient's adherence to plan as established by provider. Sees pcp and nephrologist on a regular basis. Is compliant with the plan of care.   Calls for changes, questions, or concerns Provided education to patient re: stroke prevention, s/s of heart attack and stroke    Reviewed prescribed diet heart healthy/ADA/Renal.  Mindful of dietary restrictions.  Reviewed medications with patient and discussed importance of compliance.    Advised patient, providing education and rationale, to monitor blood pressure daily and record, calling PCP for findings outside established parameters .  Report range [id number] Discussed complications of poorly controlled blood pressure such as heart disease, stroke, circulatory complications, vision complications,  kidney impairment, sexual dysfunction    Reviewed scheduled/upcoming provider appointments including: Saw pcp on 12-10-2021 and will follow up in March Advised patient to discuss changes in kidney function, questions and concerns with provider    Discussed plans with patient for ongoing care management follow up and provided patient with direct contact information for care management team    Engage patient in early, proactive and ongoing discussion about goals of care and what matters most to them    Support coping and stress management by recognizing current strategies and assist in developing new strategies such as mindfulness, journaling, relaxation techniques, problem-solving            SDOH assessments and interventions completed:  No     Care Coordination Interventions:  Yes, provided   Follow up plan: No further intervention required.   Encounter Outcome:  Pt. Visit Completed   Valente David, RN, MSN, [place] Care Management Care Management Coordinator [phone]

## 2022-05-04 DIAGNOSIS — G4733 Obstructive sleep apnea (adult) (pediatric): Secondary | ICD-10-CM | POA: Diagnosis not present

## 2022-05-04 DIAGNOSIS — I10 Essential (primary) hypertension: Secondary | ICD-10-CM | POA: Diagnosis not present

## 2022-05-11 DIAGNOSIS — N2581 Secondary hyperparathyroidism of renal origin: Secondary | ICD-10-CM | POA: Diagnosis not present

## 2022-05-11 DIAGNOSIS — N184 Chronic kidney disease, stage 4 (severe): Secondary | ICD-10-CM | POA: Diagnosis not present

## 2022-05-11 DIAGNOSIS — I10 Essential (primary) hypertension: Secondary | ICD-10-CM | POA: Diagnosis not present

## 2022-05-11 DIAGNOSIS — E1129 Type 2 diabetes mellitus with other diabetic kidney complication: Secondary | ICD-10-CM | POA: Diagnosis not present

## 2022-05-18 DIAGNOSIS — I10 Essential (primary) hypertension: Secondary | ICD-10-CM | POA: Diagnosis not present

## 2022-05-18 DIAGNOSIS — N184 Chronic kidney disease, stage 4 (severe): Secondary | ICD-10-CM | POA: Diagnosis not present

## 2022-05-18 DIAGNOSIS — N2581 Secondary hyperparathyroidism of renal origin: Secondary | ICD-10-CM | POA: Diagnosis not present

## 2022-05-18 DIAGNOSIS — E1129 Type 2 diabetes mellitus with other diabetic kidney complication: Secondary | ICD-10-CM | POA: Diagnosis not present

## 2022-05-25 DIAGNOSIS — I25119 Atherosclerotic heart disease of native coronary artery with unspecified angina pectoris: Secondary | ICD-10-CM | POA: Diagnosis not present

## 2022-05-25 DIAGNOSIS — I480 Paroxysmal atrial fibrillation: Secondary | ICD-10-CM | POA: Diagnosis not present

## 2022-05-25 DIAGNOSIS — I10 Essential (primary) hypertension: Secondary | ICD-10-CM | POA: Diagnosis not present

## 2022-05-25 DIAGNOSIS — E1122 Type 2 diabetes mellitus with diabetic chronic kidney disease: Secondary | ICD-10-CM | POA: Diagnosis not present

## 2022-05-25 DIAGNOSIS — N184 Chronic kidney disease, stage 4 (severe): Secondary | ICD-10-CM | POA: Diagnosis not present

## 2022-06-04 DIAGNOSIS — I10 Essential (primary) hypertension: Secondary | ICD-10-CM | POA: Diagnosis not present

## 2022-06-04 DIAGNOSIS — E7800 Pure hypercholesterolemia, unspecified: Secondary | ICD-10-CM | POA: Diagnosis not present

## 2022-06-04 DIAGNOSIS — G4733 Obstructive sleep apnea (adult) (pediatric): Secondary | ICD-10-CM | POA: Diagnosis not present

## 2022-06-04 DIAGNOSIS — N184 Chronic kidney disease, stage 4 (severe): Secondary | ICD-10-CM | POA: Diagnosis not present

## 2022-06-04 DIAGNOSIS — E1122 Type 2 diabetes mellitus with diabetic chronic kidney disease: Secondary | ICD-10-CM | POA: Diagnosis not present

## 2022-06-11 DIAGNOSIS — N184 Chronic kidney disease, stage 4 (severe): Secondary | ICD-10-CM | POA: Diagnosis not present

## 2022-06-11 DIAGNOSIS — I10 Essential (primary) hypertension: Secondary | ICD-10-CM | POA: Diagnosis not present

## 2022-06-11 DIAGNOSIS — E1122 Type 2 diabetes mellitus with diabetic chronic kidney disease: Secondary | ICD-10-CM | POA: Diagnosis not present

## 2022-06-11 DIAGNOSIS — G4733 Obstructive sleep apnea (adult) (pediatric): Secondary | ICD-10-CM | POA: Diagnosis not present

## 2022-06-11 DIAGNOSIS — I25119 Atherosclerotic heart disease of native coronary artery with unspecified angina pectoris: Secondary | ICD-10-CM | POA: Diagnosis not present

## 2022-06-11 DIAGNOSIS — I480 Paroxysmal atrial fibrillation: Secondary | ICD-10-CM | POA: Diagnosis not present

## 2022-06-11 DIAGNOSIS — E7800 Pure hypercholesterolemia, unspecified: Secondary | ICD-10-CM | POA: Diagnosis not present

## 2022-06-24 DIAGNOSIS — E1122 Type 2 diabetes mellitus with diabetic chronic kidney disease: Secondary | ICD-10-CM | POA: Diagnosis not present

## 2022-06-24 DIAGNOSIS — I25119 Atherosclerotic heart disease of native coronary artery with unspecified angina pectoris: Secondary | ICD-10-CM | POA: Diagnosis not present

## 2022-06-24 DIAGNOSIS — I10 Essential (primary) hypertension: Secondary | ICD-10-CM | POA: Diagnosis not present

## 2022-06-24 DIAGNOSIS — I480 Paroxysmal atrial fibrillation: Secondary | ICD-10-CM | POA: Diagnosis not present

## 2022-06-24 DIAGNOSIS — N184 Chronic kidney disease, stage 4 (severe): Secondary | ICD-10-CM | POA: Diagnosis not present

## 2022-06-30 DIAGNOSIS — H35033 Hypertensive retinopathy, bilateral: Secondary | ICD-10-CM | POA: Diagnosis not present

## 2022-06-30 DIAGNOSIS — H02834 Dermatochalasis of left upper eyelid: Secondary | ICD-10-CM | POA: Diagnosis not present

## 2022-06-30 DIAGNOSIS — E119 Type 2 diabetes mellitus without complications: Secondary | ICD-10-CM | POA: Diagnosis not present

## 2022-06-30 DIAGNOSIS — I10 Essential (primary) hypertension: Secondary | ICD-10-CM | POA: Diagnosis not present

## 2022-06-30 DIAGNOSIS — H01024 Squamous blepharitis left upper eyelid: Secondary | ICD-10-CM | POA: Diagnosis not present

## 2022-06-30 DIAGNOSIS — H01021 Squamous blepharitis right upper eyelid: Secondary | ICD-10-CM | POA: Diagnosis not present

## 2022-06-30 DIAGNOSIS — H02831 Dermatochalasis of right upper eyelid: Secondary | ICD-10-CM | POA: Diagnosis not present

## 2022-06-30 DIAGNOSIS — H16223 Keratoconjunctivitis sicca, not specified as Sjogren's, bilateral: Secondary | ICD-10-CM | POA: Diagnosis not present

## 2022-06-30 DIAGNOSIS — H2513 Age-related nuclear cataract, bilateral: Secondary | ICD-10-CM | POA: Diagnosis not present

## 2022-07-03 DIAGNOSIS — E669 Obesity, unspecified: Secondary | ICD-10-CM | POA: Diagnosis not present

## 2022-07-03 DIAGNOSIS — I2510 Atherosclerotic heart disease of native coronary artery without angina pectoris: Secondary | ICD-10-CM | POA: Diagnosis not present

## 2022-07-03 DIAGNOSIS — I4891 Unspecified atrial fibrillation: Secondary | ICD-10-CM | POA: Diagnosis not present

## 2022-07-03 DIAGNOSIS — Z794 Long term (current) use of insulin: Secondary | ICD-10-CM | POA: Diagnosis not present

## 2022-07-03 DIAGNOSIS — I10 Essential (primary) hypertension: Secondary | ICD-10-CM | POA: Diagnosis not present

## 2022-07-03 DIAGNOSIS — Z79899 Other long term (current) drug therapy: Secondary | ICD-10-CM | POA: Diagnosis not present

## 2022-07-03 DIAGNOSIS — E782 Mixed hyperlipidemia: Secondary | ICD-10-CM | POA: Diagnosis not present

## 2022-07-03 DIAGNOSIS — I480 Paroxysmal atrial fibrillation: Secondary | ICD-10-CM | POA: Diagnosis not present

## 2022-07-03 DIAGNOSIS — G4733 Obstructive sleep apnea (adult) (pediatric): Secondary | ICD-10-CM | POA: Diagnosis not present

## 2022-07-03 DIAGNOSIS — E119 Type 2 diabetes mellitus without complications: Secondary | ICD-10-CM | POA: Diagnosis not present

## 2022-07-03 DIAGNOSIS — R001 Bradycardia, unspecified: Secondary | ICD-10-CM | POA: Diagnosis not present

## 2022-07-06 DIAGNOSIS — I10 Essential (primary) hypertension: Secondary | ICD-10-CM | POA: Diagnosis not present

## 2022-07-06 DIAGNOSIS — G4733 Obstructive sleep apnea (adult) (pediatric): Secondary | ICD-10-CM | POA: Diagnosis not present

## 2022-07-07 DIAGNOSIS — Z96652 Presence of left artificial knee joint: Secondary | ICD-10-CM | POA: Diagnosis not present

## 2022-07-24 DIAGNOSIS — I10 Essential (primary) hypertension: Secondary | ICD-10-CM | POA: Diagnosis not present

## 2022-07-24 DIAGNOSIS — E1122 Type 2 diabetes mellitus with diabetic chronic kidney disease: Secondary | ICD-10-CM | POA: Diagnosis not present

## 2022-07-24 DIAGNOSIS — I25119 Atherosclerotic heart disease of native coronary artery with unspecified angina pectoris: Secondary | ICD-10-CM | POA: Diagnosis not present

## 2022-07-24 DIAGNOSIS — N184 Chronic kidney disease, stage 4 (severe): Secondary | ICD-10-CM | POA: Diagnosis not present

## 2022-07-24 DIAGNOSIS — I480 Paroxysmal atrial fibrillation: Secondary | ICD-10-CM | POA: Diagnosis not present

## 2022-07-29 DIAGNOSIS — R221 Localized swelling, mass and lump, neck: Secondary | ICD-10-CM | POA: Diagnosis not present

## 2022-08-03 DIAGNOSIS — D225 Melanocytic nevi of trunk: Secondary | ICD-10-CM | POA: Diagnosis not present

## 2022-08-03 DIAGNOSIS — D2271 Melanocytic nevi of right lower limb, including hip: Secondary | ICD-10-CM | POA: Diagnosis not present

## 2022-08-03 DIAGNOSIS — D2261 Melanocytic nevi of right upper limb, including shoulder: Secondary | ICD-10-CM | POA: Diagnosis not present

## 2022-08-03 DIAGNOSIS — D2272 Melanocytic nevi of left lower limb, including hip: Secondary | ICD-10-CM | POA: Diagnosis not present

## 2022-08-03 DIAGNOSIS — Z85828 Personal history of other malignant neoplasm of skin: Secondary | ICD-10-CM | POA: Diagnosis not present

## 2022-08-03 DIAGNOSIS — L821 Other seborrheic keratosis: Secondary | ICD-10-CM | POA: Diagnosis not present

## 2022-08-03 DIAGNOSIS — Z08 Encounter for follow-up examination after completed treatment for malignant neoplasm: Secondary | ICD-10-CM | POA: Diagnosis not present

## 2022-08-03 DIAGNOSIS — D2262 Melanocytic nevi of left upper limb, including shoulder: Secondary | ICD-10-CM | POA: Diagnosis not present

## 2022-08-05 DIAGNOSIS — I10 Essential (primary) hypertension: Secondary | ICD-10-CM | POA: Diagnosis not present

## 2022-08-05 DIAGNOSIS — G4733 Obstructive sleep apnea (adult) (pediatric): Secondary | ICD-10-CM | POA: Diagnosis not present

## 2022-09-04 DIAGNOSIS — I10 Essential (primary) hypertension: Secondary | ICD-10-CM | POA: Diagnosis not present

## 2022-09-04 DIAGNOSIS — G4733 Obstructive sleep apnea (adult) (pediatric): Secondary | ICD-10-CM | POA: Diagnosis not present

## 2022-09-16 DIAGNOSIS — I10 Essential (primary) hypertension: Secondary | ICD-10-CM | POA: Diagnosis not present

## 2022-09-16 DIAGNOSIS — E1122 Type 2 diabetes mellitus with diabetic chronic kidney disease: Secondary | ICD-10-CM | POA: Diagnosis not present

## 2022-09-16 DIAGNOSIS — N184 Chronic kidney disease, stage 4 (severe): Secondary | ICD-10-CM | POA: Diagnosis not present

## 2022-09-16 DIAGNOSIS — I25119 Atherosclerotic heart disease of native coronary artery with unspecified angina pectoris: Secondary | ICD-10-CM | POA: Diagnosis not present

## 2022-09-16 DIAGNOSIS — N2581 Secondary hyperparathyroidism of renal origin: Secondary | ICD-10-CM | POA: Diagnosis not present

## 2022-09-16 DIAGNOSIS — E1129 Type 2 diabetes mellitus with other diabetic kidney complication: Secondary | ICD-10-CM | POA: Diagnosis not present

## 2022-09-16 DIAGNOSIS — I480 Paroxysmal atrial fibrillation: Secondary | ICD-10-CM | POA: Diagnosis not present

## 2022-09-23 DIAGNOSIS — N1832 Chronic kidney disease, stage 3b: Secondary | ICD-10-CM | POA: Diagnosis not present

## 2022-09-23 DIAGNOSIS — E1129 Type 2 diabetes mellitus with other diabetic kidney complication: Secondary | ICD-10-CM | POA: Diagnosis not present

## 2022-09-23 DIAGNOSIS — I10 Essential (primary) hypertension: Secondary | ICD-10-CM | POA: Diagnosis not present

## 2022-09-23 DIAGNOSIS — R600 Localized edema: Secondary | ICD-10-CM | POA: Diagnosis not present

## 2022-10-05 DIAGNOSIS — G4733 Obstructive sleep apnea (adult) (pediatric): Secondary | ICD-10-CM | POA: Diagnosis not present

## 2022-10-05 DIAGNOSIS — I10 Essential (primary) hypertension: Secondary | ICD-10-CM | POA: Diagnosis not present

## 2022-10-08 DIAGNOSIS — H18413 Arcus senilis, bilateral: Secondary | ICD-10-CM | POA: Diagnosis not present

## 2022-10-08 DIAGNOSIS — H2513 Age-related nuclear cataract, bilateral: Secondary | ICD-10-CM | POA: Diagnosis not present

## 2022-10-08 DIAGNOSIS — H25013 Cortical age-related cataract, bilateral: Secondary | ICD-10-CM | POA: Diagnosis not present

## 2022-10-08 DIAGNOSIS — H2511 Age-related nuclear cataract, right eye: Secondary | ICD-10-CM | POA: Diagnosis not present

## 2022-10-08 DIAGNOSIS — H25043 Posterior subcapsular polar age-related cataract, bilateral: Secondary | ICD-10-CM | POA: Diagnosis not present

## 2022-10-13 DIAGNOSIS — N184 Chronic kidney disease, stage 4 (severe): Secondary | ICD-10-CM | POA: Diagnosis not present

## 2022-10-13 DIAGNOSIS — E7800 Pure hypercholesterolemia, unspecified: Secondary | ICD-10-CM | POA: Diagnosis not present

## 2022-10-13 DIAGNOSIS — I25119 Atherosclerotic heart disease of native coronary artery with unspecified angina pectoris: Secondary | ICD-10-CM | POA: Diagnosis not present

## 2022-10-13 DIAGNOSIS — E1122 Type 2 diabetes mellitus with diabetic chronic kidney disease: Secondary | ICD-10-CM | POA: Diagnosis not present

## 2022-11-04 ENCOUNTER — Other Ambulatory Visit: Payer: Self-pay | Admitting: Oncology

## 2022-11-04 DIAGNOSIS — Z1231 Encounter for screening mammogram for malignant neoplasm of breast: Secondary | ICD-10-CM

## 2022-11-04 DIAGNOSIS — I10 Essential (primary) hypertension: Secondary | ICD-10-CM | POA: Diagnosis not present

## 2022-11-04 DIAGNOSIS — G4733 Obstructive sleep apnea (adult) (pediatric): Secondary | ICD-10-CM | POA: Diagnosis not present

## 2022-11-27 DIAGNOSIS — I480 Paroxysmal atrial fibrillation: Secondary | ICD-10-CM | POA: Diagnosis not present

## 2022-11-27 DIAGNOSIS — I110 Hypertensive heart disease with heart failure: Secondary | ICD-10-CM | POA: Diagnosis not present

## 2022-11-27 DIAGNOSIS — N184 Chronic kidney disease, stage 4 (severe): Secondary | ICD-10-CM | POA: Diagnosis not present

## 2022-11-27 DIAGNOSIS — E1122 Type 2 diabetes mellitus with diabetic chronic kidney disease: Secondary | ICD-10-CM | POA: Diagnosis not present

## 2022-11-27 DIAGNOSIS — Z6838 Body mass index (BMI) 38.0-38.9, adult: Secondary | ICD-10-CM | POA: Diagnosis not present

## 2022-11-27 DIAGNOSIS — I5030 Unspecified diastolic (congestive) heart failure: Secondary | ICD-10-CM | POA: Diagnosis not present

## 2022-11-27 DIAGNOSIS — Z7984 Long term (current) use of oral hypoglycemic drugs: Secondary | ICD-10-CM | POA: Diagnosis not present

## 2022-12-07 DIAGNOSIS — E1122 Type 2 diabetes mellitus with diabetic chronic kidney disease: Secondary | ICD-10-CM | POA: Diagnosis not present

## 2022-12-07 DIAGNOSIS — I10 Essential (primary) hypertension: Secondary | ICD-10-CM | POA: Diagnosis not present

## 2022-12-07 DIAGNOSIS — E7800 Pure hypercholesterolemia, unspecified: Secondary | ICD-10-CM | POA: Diagnosis not present

## 2022-12-07 DIAGNOSIS — G4733 Obstructive sleep apnea (adult) (pediatric): Secondary | ICD-10-CM | POA: Diagnosis not present

## 2022-12-07 DIAGNOSIS — N184 Chronic kidney disease, stage 4 (severe): Secondary | ICD-10-CM | POA: Diagnosis not present

## 2022-12-07 DIAGNOSIS — I25119 Atherosclerotic heart disease of native coronary artery with unspecified angina pectoris: Secondary | ICD-10-CM | POA: Diagnosis not present

## 2022-12-09 DIAGNOSIS — H2511 Age-related nuclear cataract, right eye: Secondary | ICD-10-CM | POA: Diagnosis not present

## 2022-12-10 DIAGNOSIS — H25042 Posterior subcapsular polar age-related cataract, left eye: Secondary | ICD-10-CM | POA: Diagnosis not present

## 2022-12-10 DIAGNOSIS — H25012 Cortical age-related cataract, left eye: Secondary | ICD-10-CM | POA: Diagnosis not present

## 2022-12-10 DIAGNOSIS — H2512 Age-related nuclear cataract, left eye: Secondary | ICD-10-CM | POA: Diagnosis not present

## 2022-12-14 DIAGNOSIS — I10 Essential (primary) hypertension: Secondary | ICD-10-CM | POA: Diagnosis not present

## 2022-12-14 DIAGNOSIS — I480 Paroxysmal atrial fibrillation: Secondary | ICD-10-CM | POA: Diagnosis not present

## 2022-12-14 DIAGNOSIS — E669 Obesity, unspecified: Secondary | ICD-10-CM | POA: Diagnosis not present

## 2022-12-14 DIAGNOSIS — G4733 Obstructive sleep apnea (adult) (pediatric): Secondary | ICD-10-CM | POA: Diagnosis not present

## 2022-12-14 DIAGNOSIS — E1122 Type 2 diabetes mellitus with diabetic chronic kidney disease: Secondary | ICD-10-CM | POA: Diagnosis not present

## 2022-12-14 DIAGNOSIS — N184 Chronic kidney disease, stage 4 (severe): Secondary | ICD-10-CM | POA: Diagnosis not present

## 2022-12-14 DIAGNOSIS — Z0000 Encounter for general adult medical examination without abnormal findings: Secondary | ICD-10-CM | POA: Diagnosis not present

## 2022-12-14 DIAGNOSIS — Z23 Encounter for immunization: Secondary | ICD-10-CM | POA: Diagnosis not present

## 2022-12-14 DIAGNOSIS — I25119 Atherosclerotic heart disease of native coronary artery with unspecified angina pectoris: Secondary | ICD-10-CM | POA: Diagnosis not present

## 2022-12-23 DIAGNOSIS — H2512 Age-related nuclear cataract, left eye: Secondary | ICD-10-CM | POA: Diagnosis not present

## 2022-12-29 ENCOUNTER — Ambulatory Visit
Admission: RE | Admit: 2022-12-29 | Discharge: 2022-12-29 | Disposition: A | Discharge status: Home / Self Care | Payer: PPO | Source: Ambulatory Visit | Attending: Oncology | Admitting: Oncology

## 2022-12-29 DIAGNOSIS — Z1231 Encounter for screening mammogram for malignant neoplasm of breast: Secondary | ICD-10-CM | POA: Insufficient documentation

## 2023-01-06 DIAGNOSIS — I10 Essential (primary) hypertension: Secondary | ICD-10-CM | POA: Diagnosis not present

## 2023-01-06 DIAGNOSIS — G4733 Obstructive sleep apnea (adult) (pediatric): Secondary | ICD-10-CM | POA: Diagnosis not present

## 2023-01-20 DIAGNOSIS — R600 Localized edema: Secondary | ICD-10-CM | POA: Diagnosis not present

## 2023-01-20 DIAGNOSIS — N1832 Chronic kidney disease, stage 3b: Secondary | ICD-10-CM | POA: Diagnosis not present

## 2023-01-20 DIAGNOSIS — I10 Essential (primary) hypertension: Secondary | ICD-10-CM | POA: Diagnosis not present

## 2023-01-20 DIAGNOSIS — E1129 Type 2 diabetes mellitus with other diabetic kidney complication: Secondary | ICD-10-CM | POA: Diagnosis not present

## 2023-01-27 DIAGNOSIS — N1832 Chronic kidney disease, stage 3b: Secondary | ICD-10-CM | POA: Diagnosis not present

## 2023-01-27 DIAGNOSIS — E1129 Type 2 diabetes mellitus with other diabetic kidney complication: Secondary | ICD-10-CM | POA: Diagnosis not present

## 2023-01-27 DIAGNOSIS — I10 Essential (primary) hypertension: Secondary | ICD-10-CM | POA: Diagnosis not present

## 2023-01-27 DIAGNOSIS — R600 Localized edema: Secondary | ICD-10-CM | POA: Diagnosis not present

## 2023-01-27 DIAGNOSIS — N2581 Secondary hyperparathyroidism of renal origin: Secondary | ICD-10-CM | POA: Diagnosis not present

## 2023-02-05 DIAGNOSIS — G4733 Obstructive sleep apnea (adult) (pediatric): Secondary | ICD-10-CM | POA: Diagnosis not present

## 2023-02-05 DIAGNOSIS — I10 Essential (primary) hypertension: Secondary | ICD-10-CM | POA: Diagnosis not present

## 2023-02-22 DIAGNOSIS — I480 Paroxysmal atrial fibrillation: Secondary | ICD-10-CM | POA: Diagnosis not present

## 2023-02-22 DIAGNOSIS — I2510 Atherosclerotic heart disease of native coronary artery without angina pectoris: Secondary | ICD-10-CM | POA: Diagnosis not present

## 2023-02-22 DIAGNOSIS — R001 Bradycardia, unspecified: Secondary | ICD-10-CM | POA: Diagnosis not present

## 2023-02-22 DIAGNOSIS — E119 Type 2 diabetes mellitus without complications: Secondary | ICD-10-CM | POA: Diagnosis not present

## 2023-02-22 DIAGNOSIS — Z79899 Other long term (current) drug therapy: Secondary | ICD-10-CM | POA: Diagnosis not present

## 2023-02-22 DIAGNOSIS — E66812 Obesity, class 2: Secondary | ICD-10-CM | POA: Diagnosis not present

## 2023-02-22 DIAGNOSIS — I4891 Unspecified atrial fibrillation: Secondary | ICD-10-CM | POA: Diagnosis not present

## 2023-02-22 DIAGNOSIS — G4733 Obstructive sleep apnea (adult) (pediatric): Secondary | ICD-10-CM | POA: Diagnosis not present

## 2023-02-22 DIAGNOSIS — Z794 Long term (current) use of insulin: Secondary | ICD-10-CM | POA: Diagnosis not present

## 2023-02-22 DIAGNOSIS — I10 Essential (primary) hypertension: Secondary | ICD-10-CM | POA: Diagnosis not present

## 2023-02-22 DIAGNOSIS — E782 Mixed hyperlipidemia: Secondary | ICD-10-CM | POA: Diagnosis not present

## 2023-03-08 DIAGNOSIS — G4733 Obstructive sleep apnea (adult) (pediatric): Secondary | ICD-10-CM | POA: Diagnosis not present

## 2023-03-08 DIAGNOSIS — I10 Essential (primary) hypertension: Secondary | ICD-10-CM | POA: Diagnosis not present

## 2023-03-16 ENCOUNTER — Other Ambulatory Visit: Payer: Self-pay | Admitting: Certified Nurse Midwife

## 2023-03-16 DIAGNOSIS — Z01419 Encounter for gynecological examination (general) (routine) without abnormal findings: Secondary | ICD-10-CM | POA: Diagnosis not present

## 2023-03-16 DIAGNOSIS — Z1231 Encounter for screening mammogram for malignant neoplasm of breast: Secondary | ICD-10-CM

## 2023-03-16 DIAGNOSIS — N898 Other specified noninflammatory disorders of vagina: Secondary | ICD-10-CM | POA: Diagnosis not present

## 2023-03-16 DIAGNOSIS — R87615 Unsatisfactory cytologic smear of cervix: Secondary | ICD-10-CM | POA: Diagnosis not present

## 2023-03-19 DIAGNOSIS — I10 Essential (primary) hypertension: Secondary | ICD-10-CM | POA: Diagnosis not present

## 2023-03-19 DIAGNOSIS — I25119 Atherosclerotic heart disease of native coronary artery with unspecified angina pectoris: Secondary | ICD-10-CM | POA: Diagnosis not present

## 2023-03-19 DIAGNOSIS — I480 Paroxysmal atrial fibrillation: Secondary | ICD-10-CM | POA: Diagnosis not present

## 2023-03-19 DIAGNOSIS — N184 Chronic kidney disease, stage 4 (severe): Secondary | ICD-10-CM | POA: Diagnosis not present

## 2023-03-19 DIAGNOSIS — E1122 Type 2 diabetes mellitus with diabetic chronic kidney disease: Secondary | ICD-10-CM | POA: Diagnosis not present

## 2023-04-06 DIAGNOSIS — L292 Pruritus vulvae: Secondary | ICD-10-CM | POA: Diagnosis not present

## 2023-04-06 DIAGNOSIS — M25561 Pain in right knee: Secondary | ICD-10-CM | POA: Diagnosis not present

## 2023-04-06 DIAGNOSIS — M1711 Unilateral primary osteoarthritis, right knee: Secondary | ICD-10-CM | POA: Diagnosis not present

## 2023-04-06 DIAGNOSIS — L280 Lichen simplex chronicus: Secondary | ICD-10-CM | POA: Diagnosis not present

## 2023-04-07 DIAGNOSIS — I10 Essential (primary) hypertension: Secondary | ICD-10-CM | POA: Diagnosis not present

## 2023-04-07 DIAGNOSIS — G4733 Obstructive sleep apnea (adult) (pediatric): Secondary | ICD-10-CM | POA: Diagnosis not present

## 2023-05-07 DIAGNOSIS — I25119 Atherosclerotic heart disease of native coronary artery with unspecified angina pectoris: Secondary | ICD-10-CM | POA: Diagnosis not present

## 2023-05-07 DIAGNOSIS — G4733 Obstructive sleep apnea (adult) (pediatric): Secondary | ICD-10-CM | POA: Diagnosis not present

## 2023-05-07 DIAGNOSIS — E1122 Type 2 diabetes mellitus with diabetic chronic kidney disease: Secondary | ICD-10-CM | POA: Diagnosis not present

## 2023-05-07 DIAGNOSIS — I480 Paroxysmal atrial fibrillation: Secondary | ICD-10-CM | POA: Diagnosis not present

## 2023-05-07 DIAGNOSIS — N184 Chronic kidney disease, stage 4 (severe): Secondary | ICD-10-CM | POA: Diagnosis not present

## 2023-05-07 DIAGNOSIS — I10 Essential (primary) hypertension: Secondary | ICD-10-CM | POA: Diagnosis not present

## 2023-05-14 DIAGNOSIS — E1122 Type 2 diabetes mellitus with diabetic chronic kidney disease: Secondary | ICD-10-CM | POA: Diagnosis not present

## 2023-05-14 DIAGNOSIS — N184 Chronic kidney disease, stage 4 (severe): Secondary | ICD-10-CM | POA: Diagnosis not present

## 2023-05-14 DIAGNOSIS — M1711 Unilateral primary osteoarthritis, right knee: Secondary | ICD-10-CM | POA: Diagnosis not present

## 2023-05-16 DIAGNOSIS — M1711 Unilateral primary osteoarthritis, right knee: Principal | ICD-10-CM | POA: Insufficient documentation

## 2023-05-23 NOTE — H&P (Signed)
 ORTHOPAEDIC HISTORY & PHYSICAL Bonnie Holt, Bonnie Mings., MD - 05/14/2023 1:15 PM EST Formatting of this note is different from the original. Images from the original note were not included. Chief Complaint: Chief Complaint Patient presents with Right knee degenerative arthrosis  Reason for Visit: The patient is a 78 y.o. female who presents today for reevaluation of her right knee. She has a long history of progressive right knee pain. She localizes most of the pain along the medial aspect of the knee. She reports some swelling, no locking, and significant giving way of the knee. The pain is aggravated by any weight bearing. The knee pain limits the patient's ability to ambulate long distances. The patient has not appreciated any significant improvement despite Tylenol and activity modification. She is unable to tolerate NSAIDs due to chronic kidney disease and anticoagulation with Eliquis. She is not using any ambulatory aids. The patient states that the right knee pain has progressed to the point that it is significantly interfering with her activities of daily living.  Medications: Current Outpatient Medications Medication Sig Dispense Refill acetaminophen (TYLENOL) 650 MG ER tablet Take 1,300 mg by mouth once daily as needed for Pain atorvastatin (LIPITOR) 80 MG tablet TAKE ONE TABLET EVERY DAY 90 tablet 3 biotin 5,000 mcg TbDL Take 1 tablet by mouth once daily cholecalciferol, vitamin D3, (VITAMIN D3) 125 mcg (5,000 unit) tablet Take by mouth once daily clobetasoL (TEMOVATE) 0.05 % ointment Apply topically to affected area nightly x 4 weeks, then every other night x 4 weeks, then twice weekly for maintenance 60 g 1 coenzyme Q10 75 mg Cap Take 1 capsule by mouth once daily ELIQUIS 5 mg tablet TAKE 1 TABLET BY MOUTH EVERY 12 HOURS 180 tablet 3 flash glucose scanning (FREESTYLE LIBRE 2 READER) reader Use 1 Device as directed 1 each 1 flash glucose sensor (FREESTYLE LIBRE 2 SENSOR) Kit USE  AS DIRECTED FOR GLUCOSE MONITORING 3 each 5 glipiZIDE (GLUCOTROL) 2.5 MG XL tablet TAKE 1 TABLET BY MOUTH DAILY 90 tablet 3 ketorolac (ACULAR) 0.5 % ophthalmic solution 1 drop 4 (four) times daily losartan (COZAAR) 25 MG tablet take one tablet every day 90 tablet 3 metoprolol succinate (TOPROL-XL) 50 MG XL tablet take one tablet every day 90 tablet 3 moxifloxacin (VIGAMOX) 0.5 % ophthalmic solution 1 drop 4 (four) times daily multivitamin-lutein (CENTRUM SILVER) tablet Take 1 tablet by mouth prednisoLONE acetate (PRED FORTE) 1 % ophthalmic suspension 1 drop 4 (four) times daily semaglutide (RYBELSUS) 14 mg tablet Take 1 tablet (14 mg total) by mouth once daily Do not cut, crush, or chew 120 tablet 0 TORsemide (DEMADEX) 20 MG tablet Take 20 mg by mouth once daily as needed  No current facility-administered medications for this visit.  Allergies: Allergies Allergen Reactions Warfarin Swelling Severe bruising, weakness Ace Inhibitors Cough  Past Medical History: Past Medical History: Diagnosis Date Abnormal screening cardiac CT Arthritis of knee ASCUS with positive high risk HPV cervical Cervicalgia Chronic diastolic CHF (congestive heart failure) (CMS/HHS-HCC) 10/26/2021 Chronic ischemic heart disease Chronic osteoarthritis Cough Dermatomycosis Diabetes mellitus type 2, uncomplicated (CMS/HHS-HCC) Dyspnea and respiratory abnormalities Hyperlipidemia Hypertension Malaise and fatigue Mild dysplasia of cervix Morbid obesity with body mass index of 40.0-44.9 in adult (CMS/HHS-HCC) Obstructive sleep apnea Osteopenia Primary cancer of upper outer quadrant of left female breast (CMS/HHS-HCC) 12/06/2016 Type 2 diabetes mellitus with stage 4 chronic kidney disease (CMS/HHS-HCC) Urinary incontinence, mixed Vulvitis  Past Surgical History: Past Surgical History: Procedure Laterality Date CORONARY ANGIOPLASTY WITH STENT PLACEMENT 08/2010 CERVICAL BIOPSY W/  LOOP ELECTRODE EXCISION  12/24/2010 COLPOSCOPY 07/31/2014 Mild Dysplasia Cryosurgery of Cervix 09/03/2014 MASTECTOMY, PARTIAL Left 12/16/2016 Dr Lurline Idol Hazle Quant Left total knee arthroplasty using computer-assisted navigation 09/12/2021 Dr Ernest Pine CATARACT EXTRACTION  Social History: Social History  Socioeconomic History Marital status: Married Spouse name: Mariana Kaufman Number of children: 0 Years of education: 14 Occupational History 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 Tobacco Use Smoking status: Never Smokeless tobacco: Never Tobacco comments: never used Advertising account planner Vaping status: Never Used Substance and Sexual Activity Alcohol use: Yes Comment: 2x year Drug use: No Sexual activity: Yes Partners: Male Birth control/protection: None  Social Drivers of Catering manager Strain: Low Risk (03/16/2023) Overall Financial Resource Strain (CARDIA) Difficulty of Paying Living Expenses: Not very hard Food Insecurity: Food Insecurity Present (03/16/2023) Hunger Vital Sign Worried About Running Out of Food in the Last Year: Sometimes true Ran Out of Food in the Last Year: Sometimes true Transportation Needs: No Transportation Needs (03/16/2023) PRAPARE - Contractor (Medical): No Lack of Transportation (Non-Medical): No Physical Activity: Sufficiently Active (10/16/2021) Received from [facility] [place] Exercise Vital Sign Days of Exercise per Week: 3 days Minutes of Exercise per Session: 50 min Stress: No Stress Concern Present (10/16/2021) Received from [facility], [facility] of Occupational Health - Occupational Stress Questionnaire Feeling of Stress : Not at all Social Connections: Moderately Integrated (10/16/2021) Received from [facility] Social Connection and Isolation Panel [NHANES] Frequency of Communication with Friends and Family: More than three times a week Frequency of Social Gatherings with  Friends and Family: More than three times a week Attends Religious Services: More than 4 times per year Active Member of [employer] or Organizations: No Attends Banker Meetings: Never Marital Status: Married Housing Stability: Low Risk (05/14/2023) Housing Stability Vital Sign Unable to Pay for Housing in the Last Year: No Number of Times Moved in the Last Year: 0 Homeless in the Last Year: No  Family History: Family History Problem Relation Name Age of Onset Lung cancer Father Brain cancer Father Dementia Maternal Grandmother High blood pressure (Hypertension) Brother Woodroe Mode Obesity Brother Woodroe Mode  Review of Systems: A comprehensive 14 point ROS was performed, reviewed, and the pertinent orthopaedic findings are documented in the HPI.  Exam BP 124/68  Ht 157.5 cm (5\' 2" )  Wt 95.1 kg (209 lb 9.6 oz)  BMI 38.34 kg/m  General: Well-developed, well-nourished female seen in no acute distress. Antalgic gait. Varus thrust to the right knee.  HEENT: Atraumatic, normocephalic. Pupils are equal and reactive to light. Extraocular motion is intact. Sclera are clear. Oropharynx is clear with moist mucosa.  Neck: Supple, nontender, and with good ROM. No thyromegaly, adenopathy, JVD, or carotid bruits.  Lungs: Clear to auscultation bilaterally.  Cardiovascular: Regular rate and rhythm. Normal S1, S2. No murmur . No appreciable gallops or rubs. Peripheral pulses are palpable. No lower extremity edema. Homan`s test is negative.  Abdomen: Soft, nontender, nondistended. Bowel sounds are present.  Extremities: Good strength, stability, and range of motion of the upper extremities. Good range of motion of the hips and ankles.  Right Knee: Soft tissue swelling: mild Effusion: none Erythema: none Crepitance: mild Tenderness: medial Alignment: relative varus Mediolateral laxity: medial pseudolaxity Posterior sag: negative Patellar tracking: Good tracking  without evidence of subluxation or tilt Atrophy: No significant atrophy. Quadriceps tone was good. Range of motion: 0/3/93 degrees  Neurologic: Awake, alert, and oriented. Sensory function is intact to pinprick and light touch.  Motor strength is judged to be 5/5. Motor coordination is within normal limits. No apparent clonus. No tremor.  X-rays: I reviewed the right knee radiographs that were performed at [facility] on 04/06/2023. There is significant narrowing of the medial cartilage space with associated varus alignment. Osteophyte formation is noted. Subchondral sclerosis is noted. No evidence of fracture or dislocation.  Impression: Degenerative arthrosis of the right knee  Plan: The findings were discussed in detail with the patient. The patient was given informational material on total knee replacement. Conservative treatment options were reviewed with the patient. We discussed the risks and benefits of surgical intervention. The usual perioperative course was also discussed in detail. The patient expressed understanding of the risks and benefits of surgical intervention and would like to proceed with plans for right total knee arthroplasty.  Hemoglobin A1c is an indication of glucose control. Uncontrolled diabetes has been associated with perioperative complications including poor wound healing and surgical site infections. To decrease the risk of perioperative complications, hemoglobin A1c must be less than 8.0 prior to surgery. The patient is encouraged to work with her primary care physician and/or endocrinologist to optimize glucose management.  Lab Results Component Value Date HGBA1C 8.0 (H) 12/07/2022 HGBA1C 6.1 (H) 06/04/2022  I would also like to have the patient optimized for surgery by Cardiology (Dr. Juliann Pares) and Nephrology (Dr. Mosetta Pigeon).  I spent a total of 45 minutes in both face-to-face and non-face-to-face activities, excluding procedures performed,  for this visit on the date of this encounter.  MEDICAL CLEARANCE: Per anesthesiology, Dr. Juliann Pares, and Dr. Thedore Mins. ACTIVITY: As tolerated. WORK STATUS: Not applicable. THERAPY: Preoperative physical therapy evaluation. MEDICATIONS: Requested Prescriptions  No prescriptions requested or ordered in this encounter  FOLLOW-UP: Return for preop History & Physical pending surgery date.  Sanyla Summey P. Angie Fava., M.D.  This note was generated in part with voice recognition software and I apologize for any typographical errors that were not detected and corrected.  Electronically signed by Shari Heritage., MD at 05/16/2023 5:35 PM EST

## 2023-05-23 NOTE — Discharge Instructions (Signed)
 Instructions after Total Knee Replacement   Bonnie Holt., M.D.    Dept. of Orthopaedics & Sports Medicine [facility] [address] [place], [place]  [address]  Phone: [phone]   Fax: [phone]       [email or web address]       DIET: Drink plenty of non-alcoholic fluids. Resume your normal diet. Include foods high in fiber.  ACTIVITY:  You may use crutches or a walker with weight-bearing as tolerated, unless instructed otherwise. You may be weaned off of the walker or crutches by your Physical Therapist.  Do NOT place pillows under the knee. Anything placed under the knee could limit your ability to straighten the knee.   Use the Bone Foam 3 times a day for 30 minutes each session to help straighten the knee. Continue doing gentle exercises. Exercising will reduce the pain and swelling, increase motion, and prevent muscle weakness.   Please continue to use the TED compression stockings for 6 weeks. You may remove the stockings at night, but should reapply them in the morning. Do not drive or operate any equipment until instructed.  WOUND CARE:  The initial dressing (Aquacel) can remain in place for 7 days (see separate instructions). Continue to use the PolarCare or ice packs periodically to reduce pain and swelling. You may bathe or shower after the staples are removed at the first office visit following surgery.  MEDICATIONS: You may resume your regular medications. Please take the pain medication as prescribed on the medication. Do not take pain medication on an empty stomach. Unless instructed otherwise, you should take an enteric-coated aspirin 81 mg. TWICE a day. (This along with elevation will help reduce the possibility of blood clots/phlebitis in your operated leg.) Use a stool softener (such as Senokot-S or Colace) daily and a laxative (such as Miralax or Dulcolax) as needed to prevent constipation.  Do not drive or drink alcoholic beverages when  taking pain medications.  CALL THE OFFICE FOR: Temperature above 101 degrees Excessive bleeding or drainage on the dressing. Excessive swelling, coldness, or paleness of the toes. Persistent nausea and vomiting.  FOLLOW-UP:  You should have an appointment to return to the office in 10-14 days after surgery. Arrangements have been made for continuation of Physical Therapy (either home therapy or outpatient therapy).     [facility] Department Directory         [email or web address]       [email or web address]          Cardiology  Appointments: [place] - [phone]  Endocrinology  Appointments: [place] [phone] [place] - [phone]  Gastroenterology  Appointments: [employer] [phone] [place] - [phone]        General Surgery   Appointments: [facility]  Internal Medicine/Family Medicine  Appointments: [facility] [place] - [phone] [place] - [phone]  Metabolic and Weigh Loss Surgery  Appointments: [facility]        Neurology  Appointments: [place] [phone] [place] - [phone]  Neurosurgery  Appointments: [place]  Obstetrics & Gynecology  Appointments: [place] [phone] [place] - [phone]        Pediatrics  Appointments: [place] [phone] [place] - [phone]  Physiatry  Appointments: [place] [phone]  Physical Therapy  Appointments: [place] - [phone]        Podiatry  Appointments: [place] [phone] [place] - [phone]  Pulmonology  Appointments: [facility]  Rheumatology  Appointments: [place] [phone]  [place] Location: [facility]  [address] [place], [place]  [address]  [place] Location: [facility]. [address] [place], [place]  [address]  [place]  Location: [facility] [address] [place], [place]  [address]

## 2023-05-24 ENCOUNTER — Encounter
Admission: RE | Admit: 2023-05-24 | Discharge: 2023-05-24 | Disposition: A | Discharge status: Home / Self Care | Source: Ambulatory Visit | Attending: Orthopedic Surgery | Admitting: Orthopedic Surgery

## 2023-05-24 ENCOUNTER — Other Ambulatory Visit: Payer: Self-pay

## 2023-05-24 VITALS — BP 125/55 | Resp 16 | Ht 62.0 in | Wt 209.0 lb

## 2023-05-24 DIAGNOSIS — R600 Localized edema: Secondary | ICD-10-CM | POA: Diagnosis not present

## 2023-05-24 DIAGNOSIS — Z01818 Encounter for other preprocedural examination: Secondary | ICD-10-CM | POA: Insufficient documentation

## 2023-05-24 DIAGNOSIS — N184 Chronic kidney disease, stage 4 (severe): Secondary | ICD-10-CM | POA: Insufficient documentation

## 2023-05-24 DIAGNOSIS — Z01810 Encounter for preprocedural cardiovascular examination: Secondary | ICD-10-CM | POA: Diagnosis not present

## 2023-05-24 DIAGNOSIS — M1711 Unilateral primary osteoarthritis, right knee: Secondary | ICD-10-CM | POA: Diagnosis not present

## 2023-05-24 DIAGNOSIS — E1122 Type 2 diabetes mellitus with diabetic chronic kidney disease: Secondary | ICD-10-CM | POA: Diagnosis not present

## 2023-05-24 DIAGNOSIS — I25119 Atherosclerotic heart disease of native coronary artery with unspecified angina pectoris: Secondary | ICD-10-CM | POA: Diagnosis not present

## 2023-05-24 LAB — COMPREHENSIVE METABOLIC PANEL
ALT: 27 U/L (ref 0–44)
AST: 23 U/L (ref 15–41)
Albumin: 3.9 g/dL (ref 3.5–5.0)
Alkaline Phosphatase: 86 U/L (ref 38–126)
Anion gap: 8 (ref 5–15)
BUN: 37 mg/dL — ABNORMAL HIGH (ref 8–23)
CO2: 24 mmol/L (ref 22–32)
Calcium: 9.6 mg/dL (ref 8.9–10.3)
Chloride: 108 mmol/L (ref 98–111)
Creatinine, Ser: 2.43 mg/dL — ABNORMAL HIGH (ref 0.44–1.00)
GFR, Estimated: 20 mL/min — ABNORMAL LOW (ref >60)
Glucose, Bld: 120 mg/dL — ABNORMAL HIGH (ref 70–99)
Potassium: 3.6 mmol/L (ref 3.5–5.1)
Sodium: 140 mmol/L (ref 135–145)
Total Bilirubin: 1.1 mg/dL (ref 0.0–1.2)
Total Protein: 7.3 g/dL (ref 6.5–8.1)

## 2023-05-24 LAB — URINALYSIS, ROUTINE W REFLEX MICROSCOPIC
Bilirubin Urine: NEGATIVE
Glucose, UA: NEGATIVE mg/dL
Hgb urine dipstick: NEGATIVE
Ketones, ur: NEGATIVE mg/dL
Leukocytes,Ua: NEGATIVE
Nitrite: NEGATIVE
Protein, ur: NEGATIVE mg/dL
Specific Gravity, Urine: 1.006 (ref 1.005–1.030)
pH: 5 (ref 5.0–8.0)

## 2023-05-24 LAB — CBC
HCT: 39.7 % (ref 36.0–46.0)
Hemoglobin: 13.4 g/dL (ref 12.0–15.0)
MCH: 32.6 pg (ref 26.0–34.0)
MCHC: 33.8 g/dL (ref 30.0–36.0)
MCV: 96.6 fL (ref 80.0–100.0)
Platelets: 174 10*3/uL (ref 150–400)
RBC: 4.11 MIL/uL (ref 3.87–5.11)
RDW: 14.1 % (ref 11.5–15.5)
WBC: 6.7 10*3/uL (ref 4.0–10.5)
nRBC: 0 % (ref 0.0–0.2)

## 2023-05-24 LAB — SEDIMENTATION RATE: Sed Rate: 14 mm/h (ref 0–30)

## 2023-05-24 LAB — HEMOGLOBIN A1C
Hgb A1c MFr Bld: 6.3 % — ABNORMAL HIGH (ref 4.8–5.6)
Mean Plasma Glucose: 134.11 mg/dL

## 2023-05-24 LAB — SURGICAL PCR SCREEN
MRSA, PCR: NEGATIVE
Staphylococcus aureus: NEGATIVE

## 2023-05-24 LAB — C-REACTIVE PROTEIN: CRP: 0.6 mg/dL (ref <1.0)

## 2023-05-24 NOTE — Patient Instructions (Addendum)
 Your procedure is scheduled on: Friday 05/28/23 To find out your arrival time, please call [phone] between 1PM - 3PM on:   Thursday 05/27/23 Report to the Registration Desk on the 1st floor of the [facility]. FREE Valet parking is available.  If your arrival time is 6:00 am, do not arrive before that time as the [facility] entrance doors do not open until 6:00 am.  REMEMBER: Instructions that are not followed completely may result in serious medical risk, up to and including death; or upon the discretion of your surgeon and anesthesiologist your surgery may need to be rescheduled.  Do not eat food after midnight the night before surgery.  No gum chewing or hard candies.  You may however, drink CLEAR liquids up to 2 hours before you are scheduled to arrive for your surgery. Do not drink anything within 2 hours of your scheduled arrival time.  Clear liquids include: - water .  In addition, your doctor has ordered for you to drink the provided:   Gatorade G2 Drinking this carbohydrate drink up to two hours before surgery helps to reduce insulin resistance and improve patient outcomes. Please complete drinking 2 hours before scheduled arrival time.  One week prior to surgery: Stop Anti-inflammatories (NSAIDS) such as Advil, Aleve, Ibuprofen, Motrin, Naproxen, Naprosyn and Aspirin based products such as Excedrin, Goody's Powder, BC Powder. You may however, continue to take Tylenol if needed for pain up until the day of surgery.  Stop ANY OVER THE COUNTER supplements and vitamins until after surgery.  Continue taking all prescribed medications with the exception of the following: Stop Eliquis, last dose 05/24/23. Torsemide, last dose 05/26/23. Losartan, last dose 05/26/23.  Rybelsis, last dose 05/26/23.  TAKE ONLY THESE MEDICATIONS THE MORNING OF SURGERY WITH A SIP OF WATER:  metoprolol succinate (TOPROL-XL) 50 MG 24 hr table   No Alcohol for 24 hours before or after surgery.  No  Smoking including e-cigarettes for 24 hours before surgery.  No chewable tobacco products for at least 6 hours before surgery.  No nicotine patches on the day of surgery.  Do not use any "recreational" drugs for at least a week (preferably 2 weeks) before your surgery.  Please be advised that the combination of cocaine and anesthesia may have negative outcomes, up to and including death. If you test positive for cocaine, your surgery will be cancelled.  On the morning of surgery brush your teeth with toothpaste and water, you may rinse your mouth with mouthwash if you wish. Do not swallow any toothpaste or mouthwash.  Use CHG Soap or wipes as directed on instruction sheet.  Do not wear lotions, powders, or perfumes.   Do not shave body hair from the neck down 48 hours before surgery.  Wear comfortable clothing (specific to your surgery type) to the hospital.  Do not wear jewelry, make-up, hairpins, clips or nail polish.  For welded (permanent) jewelry: bracelets, anklets, waist bands, etc.  Please have this removed prior to surgery.  If it is not removed, there is a chance that hospital personnel will need to cut it off on the day of surgery. Contact lenses, hearing aids and dentures may not be worn into surgery.  Bring your C-PAP to the hospital in case you may have to spend the night.   Do not bring valuables to the hospital. [facility] is not responsible for any missing/lost belongings or valuables.   Notify your doctor if there is any change in your medical condition (  cold, fever, infection).  If you are being discharged the day of surgery, you will not be allowed to drive home. You will need a responsible individual to drive you home and stay with you for 24 hours after surgery.   If you are taking public transportation, you will need to have a responsible individual with you.  If you are being admitted to the hospital overnight, leave your suitcase in the car. After surgery  it may be brought to your room.  In case of increased patient census, it may be necessary for you, the patient, to continue your postoperative care in the Same Day Surgery department.  After surgery, you can help prevent lung complications by doing breathing exercises.  Take deep breaths and cough every 1-2 hours. Your doctor may order a device called an Incentive Spirometer to help you take deep breaths. When coughing or sneezing, hold a pillow firmly against your incision with both hands. This is called "splinting." Doing this helps protect your incision. It also decreases belly discomfort.  Surgery Visitation Policy:  Patients undergoing a surgery or procedure may have two family members or support persons with them as long as the person is not COVID-19 positive or experiencing its symptoms.   Inpatient Visitation:    Visiting hours are 7 a.m. to 8 p.m. Up to four visitors are allowed at one time in a patient room. The visitors may rotate out with other people during the day. One designated support person (adult) may remain overnight.  Due to an increase in RSV and influenza rates and associated hospitalizations, children ages 74 and under will not be able to visit patients in [facility]. Masks continue to be strongly recommended.  Please call the Pre-admissions Testing Dept. at [phone] if you have any questions about these instructions.    Pre-operative 5 CHG Bath Instructions   You can play a key role in reducing the risk of infection after surgery. Your skin needs to be as free of germs as possible. You can reduce the number of germs on your skin by washing with CHG (chlorhexidine gluconate) soap before surgery. CHG is an antiseptic soap that kills germs and continues to kill germs even after washing.   DO NOT use if you have an allergy to chlorhexidine/CHG or antibacterial soaps. If your skin becomes reddened or irritated, stop using the CHG and notify one of our  RNs at [phone].   Please shower with the CHG soap starting 4 days before surgery using the following schedule:   Monday 05/24/23 - Friday 05/28/23    Please keep in mind the following:  DO NOT shave, including legs and underarms, starting the day of your first shower.   You may shave your face at any point before/day of surgery.  Place clean sheets on your bed the day you start using CHG soap. Use a clean washcloth (not used since being washed) for each shower. DO NOT sleep with pets once you start using the CHG.   CHG Shower Instructions:  If you choose to wash your hair and private area, wash first with your normal shampoo/soap.  After you use shampoo/soap, rinse your hair and body thoroughly to remove shampoo/soap residue.  Turn the water OFF and apply about 3 tablespoons (45 ml) of CHG soap to a CLEAN washcloth.  Apply CHG soap ONLY FROM YOUR NECK DOWN TO YOUR TOES (washing for 3-5 minutes)  DO NOT use CHG soap on face, private areas, open wounds, or sores.  Pay special attention to the area where your surgery is being performed.  If you are having back surgery, having someone wash your back for you may be helpful. Wait 2 minutes after CHG soap is applied, then you may rinse off the CHG soap.  Pat dry with a clean towel  Put on clean clothes/pajamas   If you choose to wear lotion, please use ONLY the CHG-compatible lotions on the back of this paper.     Additional instructions for the day of surgery: DO NOT APPLY any lotions, deodorants, cologne, or perfumes.   Put on clean/comfortable clothes.  Brush your teeth.  Ask your nurse before applying any prescription medications to the skin.      CHG Compatible Lotions   Aveeno Moisturizing lotion  Cetaphil Moisturizing Cream  Cetaphil Moisturizing Lotion  Clairol Herbal Essence Moisturizing Lotion, Dry Skin  Clairol Herbal Essence Moisturizing Lotion, Extra Dry Skin  Clairol Herbal Essence Moisturizing Lotion, Normal  Skin  Curel Age Defying Therapeutic Moisturizing Lotion with Alpha Hydroxy  Curel Extreme Care Body Lotion  Curel Soothing Hands Moisturizing Hand Lotion  Curel Therapeutic Moisturizing Cream, Fragrance-Free  Curel Therapeutic Moisturizing Lotion, Fragrance-Free  Curel Therapeutic Moisturizing Lotion, Original Formula  Eucerin Daily Replenishing Lotion  Eucerin Dry Skin Therapy Plus Alpha Hydroxy Crme  Eucerin Dry Skin Therapy Plus Alpha Hydroxy Lotion  Eucerin Original Crme  Eucerin Original Lotion  Eucerin Plus Crme Eucerin Plus Lotion  Eucerin TriLipid Replenishing Lotion  Keri Anti-Bacterial Hand Lotion  Keri Deep Conditioning Original Lotion Dry Skin Formula Softly Scented  Keri Deep Conditioning Original Lotion, Fragrance Free Sensitive Skin Formula  Keri Lotion Fast Absorbing Fragrance Free Sensitive Skin Formula  Keri Lotion Fast Absorbing Softly Scented Dry Skin Formula  Keri Original Lotion  Keri Skin Renewal Lotion Keri Silky Smooth Lotion  Keri Silky Smooth Sensitive Skin Lotion  Nivea Body Creamy Conditioning Oil  Nivea Body Extra Enriched Teacher, adult education Moisturizing Lotion Nivea Crme  Nivea Skin Firming Lotion  NutraDerm 30 Skin Lotion  NutraDerm Skin Lotion  NutraDerm Therapeutic Skin Cream  NutraDerm Therapeutic Skin Lotion  ProShield Protective Hand Cream  Provon moisturizing lotion

## 2023-05-25 ENCOUNTER — Encounter: Payer: Self-pay | Admitting: Orthopedic Surgery

## 2023-05-25 NOTE — Progress Notes (Signed)
 Perioperative Services  Pre-Admission/Anesthesia Testing Clinical Review  Date: 05/25/23  Patient Demographics:  Name: Bonnie Holt DOB:   Apr 17, 1945 MRN:   [id number]  Planned Surgical Procedure(s):    Case: [id number] Date/Time: 09/12/21 1133   Procedure: COMPUTER ASSISTED TOTAL KNEE ARTHROPLASTY - RNFA (Left: Knee)   Anesthesia type: Choice   Pre-op diagnosis: PRIMARY OSTEOARTHRITIS OF LEFT KNEE.   Location: [facility] OR ROOM 03 / [facility]S FOR ANEST[facility]ESIA GROUP   Surgeons: Donato Heinz, MD   NOTE: Available PAT nursing documentation and vital signs have been reviewed. Clinical nursing staff has updated patient's PMH/PSHx, current medication list, and drug allergies/intolerances to ensure comprehensive history available to assist in medical decision making as it pertains to the aforementioned surgical procedure and anticipated anesthetic course. Extensive review of available clinical information performed. [place] PMH and PSHx updated with any diagnoses/procedures that  may have been inadvertently omitted during her intake with the pre-admission testing department's nursing staff.  Clinical Discussion:  Bonnie Holt is a 78 y.o. female who is submitted for pre-surgical anesthesia review and clearance prior to her undergoing the above procedure. Patient has never been a smoker. Pertinent PMH includes: CAD, PAF, LBBB, bradycardia, HFpEF, HTN, HLD, T2DM, CKD-IV, dyspnea, OSAH (requires nocturnal PAP therapy), OA, cervicalgia, LEFT breast cancer (s/p XRT).  Patient is followed by cardiology Juliann Pares, MD). She was last seen in the cardiology clinic on 02/22/2023; notes reviewed.  At the time of her clinic visit, patient doing well overall from a cardiovascular perspective.  Patient reporting stable peripheral edema and episodes of bradycardia. She denied any episodes of chest pain, shortness breath, PND, orthopnea, palpitations, vertiginous symptoms, or presyncope/syncope.  Patient with a  past medical history significant for cardiovascular diagnoses. Documented physical exam was grossly benign, providing no evidence of acute exacerbation and/or decompensation of the patient's known cardiovascular conditions. Of note, patient has received care in [place].  Records regarding remote cardiovascular care unavailable at time of consult.  Information gathered from patient and notes from local care teams.  Patient underwent diagnostic left heart catheterization in 2012 that revealed obstructive CAD.  She subsequently underwent PCI with BMS placement x1 (unknown type/location).  Myocardial perfusion imaging study performed on 07/01/2017 revealed a normal left ventricular systolic function with an EF of 53%.  There were no regional wall motion abnormalities.  There was evidence of a borderline anterior apical septal defect related to known LBBB.  Study interpreted as intermediate risk.  Last TTE was performed on 08/25/2017 revealing a low normal left ventricular systolic function with mild LVH; LVEF 50-55%.  Study was reported to be very limited due to poor sound wave transmission, patient noncompliance, and tachycardia.  Left ventricular function not well seen, however appears preserved.  Valves poorly visualized.  Previous TTE from 06/28/2017 reviewed.  EF preserved at 50%.  There was trivial to mild mitral, tricuspid, and pulmonary valve regurgitation.  There is mild LAE and LVH.  There was no significant transvalvular gradient suggestive of stenosis.  Patient with an atrial fibrillation diagnosis; CHA2DS2-VASc Score = 5 (age x 2, sex, HTN, T2DM). Her rate and rhythm care currently being maintained on oral metoprolol succinate. She is chronically anticoagulated using standard dose apixaban; reported to be compliant with therapy with no evidence or reports of GI bleeding.  Blood pressure reasonably controlled at 124/68 mmHg on currently prescribed ARB (losartan(, beta-blocker (metoprolol  succinate), and diuretic (torsemide) therapies. She is on atorvastatin for her HLD diagnosis and further ASCVD prevention. T2DM  loosely controlled on prescribed regimen; last HgbA1c was 8.0% when checked on 06/03/2021.  Of note, since patient was last seen by cardiology, she has had her hemoglobin A1c rechecked with improvement to 6.3% when checked on 05/24/2023. Patient has an OSAH diagnosis and is reported to be compliant with her prescribed nocturnal PAP therapy.  Functional capacity limited by arthritides. Patient is able to complete all of her ADLs/IADLs without cardiovascular limitation.  Per the DASI,patient felt to be able to achieve at least 4 METS of activity without angina/anginal equivalent symptoms.  No changes were made to her medication regimen.  Patient to follow-up with outpatient cardiology in 6 months or sooner if needed.  Lewayne Bunting is scheduled for an elective COMPUTER ASSISTED TOTAL KNEE ARTHROPLASTY - RNFA (Right: Knee) on 05/28/2023 with Dr. Francesco Sor, MD. Given patient's past medical history significant for cardiovascular diagnoses, presurgical cardiac clearance was sought by the  PAT team. Per cardiology, "this patient is optimized for surgery and may proceed with the planned procedural course with a LOW risk of significant perioperative cardiovascular complications".    Again, this patient is on daily anticoagulation therapy.  She has been instructed on recommendations from her cardiologist for holding her apixaban for 3 days prior to her procedure with plans to restart as soon as postoperative bleeding risk felt to be minimized by her primary attending surgeon.  The patient is aware that her last dose of apixaban should be on 05/24/2023.  Patient denies previous perioperative complications with anesthesia in the past. In review of the available records, it is noted that patient underwent a general anesthetic course here at [facility] in 08/2021 without documented complications.      05/24/2023    1:01 PM 03/10/2023   12:10 PM 03/26/2022    2:06 PM  Vitals with BMI  Height 5\' 2"     Weight 209 lbs  210 lbs 8 oz  BMI 38.22  38.49  Systolic 125 142 161  Diastolic 55 47 53  Pulse  57 64   Providers/Specialists:   NOTE: Primary physician provider listed below. Patient may have been seen by APP or partner within same practice.   PROVIDER ROLE / SPECIALTY LAST OV  Hooten, Illene Labrador, MD Orthopedics (Surgeon) 05/14/2023  Lauro Regulus, MD Primary Care Provider 12/14/2022  Rudean Hitt, MD Cardiology 02/22/2023  Gerarda Fraction, MD Medical Oncology 03/26/2022  Carmina Miller, MD Radiation Oncology 12/22/2021  Mosetta Pigeon, MD Nephrology 01/27/2023   Allergies:  Levofloxacin, Ace inhibitors, and Warfarin  Current Home Medications:   No current facility-administered medications for this encounter.    acetaminophen (TYLENOL) 650 MG CR tablet   apixaban (ELIQUIS) 5 MG TABS tablet   atorvastatin (LIPITOR) 80 MG tablet   Biotin 5000 MCG CAPS   Calcium-Vitamin D-Vitamin K (CALCIUM + D PO)   clobetasol cream (TEMOVATE) 0.05 %   Flaxseed, Linseed, (FLAXSEED OIL PO)   glipiZIDE (GLUCOTROL XL) 2.5 MG 24 hr tablet   losartan (COZAAR) 25 MG tablet   metoprolol succinate (TOPROL-XL) 50 MG 24 hr tablet   Multiple Vitamin (MULTIVITAMIN WITH MINERALS) TABS tablet   Semaglutide 14 MG TABS   Sennosides 25 MG TABS   torsemide (DEMADEX) 20 MG tablet   Cholecalciferol (VITAMIN D3) 50 MCG (2000 UT) capsule   CINNAMON PO   Coenzyme Q10 (CO Q-10) 100 MG CAPS   Continuous Blood Gluc Sensor (FREESTYLE LIBRE 14 DAY SENSOR) MISC   History:   Past  Medical History:  Diagnosis Date   (HFpEF) heart failure with preserved ejection fraction (HCC) 06/28/2017   a.) TTE 06/28/2017: EF 50%, mild LVH, mild LAE, triv PR, mild MR/TR, G1DD.   Arthritis    ASCUS with positive high risk HPV cervical    Basal cell carcinoma  of skin    Bradycardia    Breast cancer, left (HCC) 11/19/2016   a.) stage IA (cT1b, cN0, cM0, G2, ER+, PR+, HER2-); OncotypeDx score = 6; s/p lumpectomy 12/16/2016 + adjuvant XRT (completed 03/20/2017) + 5 years endocrine (letrozole) therapy (completes 03/2022)   Cervicalgia    Chronic ischemic heart disease    CKD (chronic kidney disease), stage IV (HCC)    Coronary artery disease    a.) LHC/PCI in 2012 in [place]; BMS x 1 placed (unknown type/location)   Dermatomycosis    Dyspnea    HLD (hyperlipidemia)    Hypertension    Left bundle branch block (LBBB)    Long term current use of aromatase inhibitor    a.) letrozole; completes 5 years of therapy in 03/2022   On apixaban therapy    OSA treated with BiPAP    Osteopenia    PAF (paroxysmal atrial fibrillation) (HCC)    a.) CHA2DS2-VASc = 5 (age x2, sex, HTN, T2DM); b.) rate/rhythm maintained on oral metoprolol succinate; chronically anticoagulated using standard dose apixaban   Personal history of radiation therapy    a.) for LEFT breast cancer; completed 03/2017   Tick fever    Type 2 diabetes mellitus treated with insulin (HCC)    a.) has CGM in place   Urinary incontinence    Past Surgical History:  Procedure Laterality Date   BREAST BIOPSY Left 11/19/2016   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   BREAST BIOPSY Left 11/19/2016   wing clip 2:30 4 cmfn Invasive mammary carcinoma   BREAST BIOPSY Left 11/19/2016   ribbon clip 3:00 6cmfn Invasive mammary carcinoma   BREAST BIOPSY Left 12/03/2016   LN biopsy, negative   BREAST BIOPSY Left 11/17/2017   affirm bx of calcs, x clip -FIBROSIS WITH DYSTROPHIC CALCIFICATIONS   BREAST LUMPECTOMY Left 12/16/2016   excision of all 3 sites, LN negative   CORONARY ANGIOPLASTY WITH STENT PLACEMENT Left 2012   [employer]   DIALYSIS/PERMA CATHETER INSERTION N/A 09/09/2017   Procedure: DIALYSIS/PERMA CATHETER INSERTION;  Surgeon: Annice Needy, MD;  Location: [facility] INVASIVE CV LAB;  Service:  Cardiovascular;  Laterality: N/A;   DIALYSIS/PERMA CATHETER REMOVAL N/A 10/04/2017   Procedure: DIALYSIS/PERMA CATHETER REMOVAL;  Surgeon: Annice Needy, MD;  Location: [facility] INVASIVE CV LAB;  Service: Cardiovascular;  Laterality: N/A;   ESOPHAGOGASTRODUODENOSCOPY (EGD) WITH PROPOFOL N/A 09/07/2017   Procedure: ESOPHAGOGASTRODUODENOSCOPY (EGD) WITH PROPOFOL;  Surgeon: Midge Minium, MD;  Location: [facility] ENDOSCOPY;  Service: Endoscopy;  Laterality: N/A;   KNEE ARTHROPLASTY Left 09/12/2021   Procedure: COMPUTER ASSISTED TOTAL KNEE ARTHROPLASTY;  Surgeon: Donato Heinz, MD;  Location: [facility] ORS;  Service: Orthopedics;  Laterality: Left;   PARTIAL MASTECTOMY WITH NEEDLE LOCALIZATION Left 12/16/2016   Procedure: PARTIAL MASTECTOMY WITH NEEDLE LOCALIZATION;  Surgeon: Carolan Shiver, MD;  Location: [facility] ORS;  Service: General;  Laterality: Left;   SENTINEL NODE BIOPSY Left 12/16/2016   Procedure: SENTINEL NODE BIOPSY;  Surgeon: Carolan Shiver, MD;  Location: [facility] ORS;  Service: General;  Laterality: Left;   Family History  Problem Relation Age of Onset   Brain cancer Father    Diabetes Father    Hypertension Brother  Heart Problems Maternal Aunt    Diabetes Paternal Aunt    Heart Problems Maternal Grandmother    Dementia Paternal Grandmother    Heart Problems Brother    Hypertension Brother    Asthma Maternal Aunt    Arthritis Maternal Aunt    Diabetes Paternal Aunt    Cancer Paternal Uncle    Breast cancer Neg Hx    Social History   Tobacco Use   Smoking status: Never   Smokeless tobacco: Never  Vaping Use   Vaping status: Never Used  Substance Use Topics   Alcohol use: Yes    Comment: socially -  2 per year   Drug use: No    Pertinent Clinical Results:  LABS:  No visits with results within 1 Day(s) from this visit.  Latest known visit with results is:  Hospital Outpatient Visit on 05/24/2023  Component Date Value Ref Range Status   CRP 05/24/2023 0.6  <1.0 mg/dL  Final   Performed at [facility], [address], [place], [place] [address]   Sed Rate 05/24/2023 14  0 - 30 mm/hr Final   Performed at [facility], [address] [place], [place] [address]   Hgb A1c MFr Bld 05/24/2023 6.3 (H)  4.8 - 5.6 % Final   Comment: (NOTE) Pre diabetes:          5.7%-6.4%  Diabetes:              >6.4%  Glycemic control for   <7.0% adults with diabetes    Mean Plasma Glucose 05/24/2023 134.11  mg/dL Final   Performed at [facility], [address] [place], [place] [address]   WBC 05/24/2023 6.7  4.0 - 10.5 K/uL Final   RBC 05/24/2023 4.11  3.87 - 5.11 MIL/uL Final   Hemoglobin 05/24/2023 13.4  12.0 - 15.0 g/dL Final   HCT 91/47/8295 39.7  36.0 - 46.0 % Final   MCV 05/24/2023 96.6  80.0 - 100.0 fL Final   MCH 05/24/2023 32.6  26.0 - 34.0 pg Final   MCHC 05/24/2023 33.8  30.0 - 36.0 g/dL Final   RDW 62/13/0865 14.1  11.5 - 15.5 % Final   Platelets 05/24/2023 174  150 - 400 K/uL Final   nRBC 05/24/2023 0.0  0.0 - 0.2 % Final   Performed at [facility], [address] [place], [place] [address]   Sodium 05/24/2023 140  135 - 145 mmol/L Final   Potassium 05/24/2023 3.6  3.5 - 5.1 mmol/L Final   Chloride 05/24/2023 108  98 - 111 mmol/L Final   CO2 05/24/2023 24  22 - 32 mmol/L Final   Glucose, Bld 05/24/2023 120 (H)  70 - 99 mg/dL Final   Glucose reference range applies only to samples taken after fasting for at least 8 hours.   BUN 05/24/2023 37 (H)  8 - 23 mg/dL Final   Creatinine, Ser 05/24/2023 2.43 (H)  0.44 - 1.00 mg/dL Final   Calcium 62/95/2841 9.6  8.9 - 10.3 mg/dL Final   Total Protein 32/44/0102 7.3  6.5 - 8.1 g/dL Final   Albumin 72/53/6644 3.9  3.5 - 5.0 g/dL Final   AST 03/47/4259 23  15 - 41 U/L Final   ALT 05/24/2023 27  0 - 44 U/L Final   Alkaline Phosphatase 05/24/2023 86  38 - 126 U/L Final   Total Bilirubin 05/24/2023 1.1  0.0 - 1.2 mg/dL Final   GFR, Estimated 05/24/2023 20 (L)  >  60 mL/min Final    Comment: (NOTE) Calculated using the CKD-EPI Creatinine Equation (2021)    Anion gap 05/24/2023 8  5 - 15 Final   Performed at [facility], [address] [place], [place] [address]   Color, Urine 05/24/2023 STRAW (A)  YELLOW Final   APPearance 05/24/2023 CLEAR (A)  CLEAR Final   Specific Gravity, Urine 05/24/2023 1.006  1.005 - 1.030 Final   pH 05/24/2023 5.0  5.0 - 8.0 Final   Glucose, UA 05/24/2023 NEGATIVE  NEGATIVE mg/dL Final   Hgb urine dipstick 05/24/2023 NEGATIVE  NEGATIVE Final   Bilirubin Urine 05/24/2023 NEGATIVE  NEGATIVE Final   Ketones, ur 05/24/2023 NEGATIVE  NEGATIVE mg/dL Final   Protein, ur 60/45/4098 NEGATIVE  NEGATIVE mg/dL Final   Nitrite 11/91/4782 NEGATIVE  NEGATIVE Final   Leukocytes,Ua 05/24/2023 NEGATIVE  NEGATIVE Final   Performed at [facility], [address] [place], [place] [address]   MRSA, PCR 05/24/2023 NEGATIVE  NEGATIVE Final   Staphylococcus aureus 05/24/2023 NEGATIVE  NEGATIVE Final   Comment: (NOTE) The Xpert SA Assay (FDA approved for NASAL specimens in patients 29 years of age and older), is one component of a comprehensive surveillance program. It is not intended to diagnose infection nor to guide or monitor treatment. Performed at [facility], [address] [place], [place] [address]     ECG: Date: 05/24/2023 Rate: 61 bpm Rhythm:  NSR; nonspecific intraventricular block Axis (leads I and aVF): Left axis deviation Intervals: PR 190 ms. QRS 162 ms. QTc 535 ms. ST segment and T wave changes: No evidence of acute ST segment elevation or depression.  Evidence of an age undetermined lateral infarct present. Comparison: Previous tracing on 09/12/2021 showed wide QRS rhythm at rate of 44 bpm; LBBB present.    IMAGING / PROCEDURES: DIAGNOSTIC RADIOGRAPHS OF RIGHT KNEE 3 VIEWS performed on 04/06/2023 Severe degenerative changes, primarily involving the medial compartment with 100% Medial joint space  narrowing.   There is subchondral sclerosis noted, as well as small osteophytes present.   There does appear to be a potential small osteochondral lesion off the medial aspect just below the tibial plateau.   Overall alignment is mild varus.   Sunrise views show hardware in place on the patient's left knee consistent with her history of a left total knee arthroplasty.  No fractures, lytic lesions, or abnormal calcifications are noted   MYOCARDIAL PERFUSION IMAGING STUDY (LEXISCAN) performed on 07/01/2017 Normal left ventricular systolic function with EF of 53% Normal myocardial thickening and wall motion No artifact Left ventricular cavity size normal SPECT images demonstrate a small perfusion abnormality of mild intensity present in the anterior apical septal region on stress images possibly related to LBBB Borderline myocardial perfusion scan due to abnormal ECG changes at rest  TRANSTHORACIC ECHOCARDIOGRAM performed on 06/28/2017 Low normal left ventricular systolic function with an EF of 50% Mild LVH Diastolic Doppler parameters consistent with abnormal relaxation (G1DD). Mild LAE Mild mitral and tricuspid valve regurgitation Trivial pulmonary valve regurgitation No aortic valve regurgitation Normal gradients; no valvular stenosis No pericardial effusion  Impression and Plan:  Bonnie Holt has been referred for pre-anesthesia review and clearance prior to her undergoing the planned anesthetic and procedural courses. Available labs, pertinent testing, and imaging results were personally reviewed by me in preparation for upcoming operative/procedural course. [facility] medical record has been updated following extensive record review and patient interview with PAT staff.    This patient has been appropriately cleared  by cardiology with an overall LOW risk of experiencing significant perioperative cardiovascular complications. Based on clinical review performed today (05/25/23),  barring any significant acute changes in the patient's overall condition, it is anticipated that she will be able to proceed with the planned surgical intervention. Any acute changes in clinical condition may necessitate her procedure being postponed and/or cancelled. Patient will meet with anesthesia team (MD and/or CRNA) on the day of her procedure for preoperative evaluation/assessment. Questions regarding anesthetic course will be fielded at that time.    Pre-surgical instructions were reviewed with the patient during his PAT appointment, and questions were fielded to satisfaction by PAT clinical staff. She has been instructed on which medications that he will need to hold prior to surgery, as well as the ones that have been deemed safe/appropriate to take on the day of his procedure. As part of the general education provided by PAT, patient made aware both verbally and in writing, that she would need to abstain from the use of any illegal substances during her perioperative course. She was advised that failure to follow the provided instructions could necessitate case cancellation or result in serious perioperative complications up to and including death. Patient encouraged to contact PAT and/or her surgeon's office to discuss any questions or concerns that may arise prior to surgery; verbalized understanding.   Quentin Mulling, MSN, APRN, FNP-C, CEN [facility]  Peri-operative Services Nurse Practitioner Phone: [phone] Fax: [phone] 05/25/23 3:56 PM  NOTE: This note has been prepared using Dragon dictation software. Despite my best ability to proofread, there is always the potential that unintentional transcriptional errors may still occur from this process.

## 2023-05-27 MED ORDER — SODIUM CHLORIDE 0.9 % IV SOLN: INTRAVENOUS | Status: DC

## 2023-05-27 MED ORDER — CHLORHEXIDINE GLUCONATE 4 % EX SOLN: 60.0000 mL | Freq: Once | CUTANEOUS | Status: DC

## 2023-05-27 MED ORDER — ORAL CARE MOUTH RINSE: 15.0000 mL | Freq: Once | OROMUCOSAL | Status: AC

## 2023-05-27 MED ORDER — DEXAMETHASONE SODIUM PHOSPHATE 10 MG/ML IJ SOLN
8.0000 mg | Freq: Once | INTRAMUSCULAR | Status: AC
  Administered 2023-05-28: 8 mg via INTRAVENOUS

## 2023-05-27 MED ORDER — TRANEXAMIC ACID-NACL 1000-0.7 MG/100ML-% IV SOLN
1000.0000 mg | INTRAVENOUS | Status: AC
  Administered 2023-05-28: 1000 mg via INTRAVENOUS

## 2023-05-27 MED ORDER — CEFAZOLIN SODIUM-DEXTROSE 2-4 GM/100ML-% IV SOLN
2.0000 g | INTRAVENOUS | Status: AC
  Administered 2023-05-28: 2 g via INTRAVENOUS

## 2023-05-27 MED ORDER — CHLORHEXIDINE GLUCONATE 0.12 % MT SOLN
15.0000 mL | Freq: Once | OROMUCOSAL | Status: AC
  Administered 2023-05-28: 15 mL via OROMUCOSAL

## 2023-05-27 MED ORDER — GABAPENTIN 300 MG PO CAPS
300.0000 mg | ORAL_CAPSULE | Freq: Once | ORAL | Status: AC
  Administered 2023-05-28: 300 mg via ORAL

## 2023-05-28 ENCOUNTER — Observation Stay
Admission: RE | Admit: 2023-05-28 | Discharge: 2023-05-29 | Disposition: A | Discharge status: Home Health | Attending: Orthopedic Surgery | Admitting: Orthopedic Surgery

## 2023-05-28 ENCOUNTER — Ambulatory Visit: Payer: Self-pay | Admitting: Urgent Care

## 2023-05-28 ENCOUNTER — Encounter
Admission: RE | Disposition: A | Discharge status: Home Health | Payer: Self-pay | Source: Home / Self Care | Attending: Orthopedic Surgery

## 2023-05-28 ENCOUNTER — Other Ambulatory Visit: Payer: Self-pay

## 2023-05-28 ENCOUNTER — Observation Stay

## 2023-05-28 ENCOUNTER — Encounter: Payer: Self-pay | Admitting: Orthopedic Surgery

## 2023-05-28 DIAGNOSIS — Z96651 Presence of right artificial knee joint: Secondary | ICD-10-CM

## 2023-05-28 DIAGNOSIS — N184 Chronic kidney disease, stage 4 (severe): Secondary | ICD-10-CM | POA: Diagnosis not present

## 2023-05-28 DIAGNOSIS — Z471 Aftercare following joint replacement surgery: Secondary | ICD-10-CM | POA: Diagnosis not present

## 2023-05-28 DIAGNOSIS — Z79899 Other long term (current) drug therapy: Secondary | ICD-10-CM | POA: Diagnosis not present

## 2023-05-28 DIAGNOSIS — Z853 Personal history of malignant neoplasm of breast: Secondary | ICD-10-CM | POA: Insufficient documentation

## 2023-05-28 DIAGNOSIS — Z7901 Long term (current) use of anticoagulants: Secondary | ICD-10-CM | POA: Insufficient documentation

## 2023-05-28 DIAGNOSIS — I5032 Chronic diastolic (congestive) heart failure: Secondary | ICD-10-CM | POA: Insufficient documentation

## 2023-05-28 DIAGNOSIS — I25119 Atherosclerotic heart disease of native coronary artery with unspecified angina pectoris: Secondary | ICD-10-CM

## 2023-05-28 DIAGNOSIS — Z7984 Long term (current) use of oral hypoglycemic drugs: Secondary | ICD-10-CM | POA: Diagnosis not present

## 2023-05-28 DIAGNOSIS — I130 Hypertensive heart and chronic kidney disease with heart failure and stage 1 through stage 4 chronic kidney disease, or unspecified chronic kidney disease: Secondary | ICD-10-CM | POA: Insufficient documentation

## 2023-05-28 DIAGNOSIS — M1711 Unilateral primary osteoarthritis, right knee: Principal | ICD-10-CM | POA: Insufficient documentation

## 2023-05-28 DIAGNOSIS — R600 Localized edema: Secondary | ICD-10-CM

## 2023-05-28 DIAGNOSIS — I480 Paroxysmal atrial fibrillation: Secondary | ICD-10-CM | POA: Diagnosis not present

## 2023-05-28 DIAGNOSIS — Z96652 Presence of left artificial knee joint: Secondary | ICD-10-CM | POA: Insufficient documentation

## 2023-05-28 DIAGNOSIS — E1122 Type 2 diabetes mellitus with diabetic chronic kidney disease: Secondary | ICD-10-CM | POA: Diagnosis not present

## 2023-05-28 DIAGNOSIS — M799 Soft tissue disorder, unspecified: Secondary | ICD-10-CM | POA: Diagnosis not present

## 2023-05-28 HISTORY — DX: Dyspnea, unspecified: R06.00

## 2023-05-28 HISTORY — DX: Long term (current) use of anticoagulants: Z79.01

## 2023-05-28 HISTORY — DX: Unspecified urinary incontinence: R32

## 2023-05-28 HISTORY — PX: KNEE ARTHROPLASTY: SHX992

## 2023-05-28 HISTORY — DX: Cervicalgia: M54.2

## 2023-05-28 HISTORY — DX: Superficial mycosis, unspecified: B36.9

## 2023-05-28 LAB — GLUCOSE, CAPILLARY
Glucose-Capillary: 113 mg/dL — ABNORMAL HIGH (ref 70–99)
Glucose-Capillary: 165 mg/dL — ABNORMAL HIGH (ref 70–99)
Glucose-Capillary: 201 mg/dL — ABNORMAL HIGH (ref 70–99)

## 2023-05-28 SURGERY — ARTHROPLASTY, KNEE, TOTAL, USING IMAGELESS COMPUTER-ASSISTED NAVIGATION
Anesthesia: Spinal | Site: Knee | Laterality: Right

## 2023-05-28 MED ORDER — SODIUM CHLORIDE 0.9 % IV SOLN: INTRAVENOUS | Status: DC

## 2023-05-28 MED ORDER — PROPOFOL 1000 MG/100ML IV EMUL
INTRAVENOUS | Status: AC
Start: 2023-05-28 — End: ?
  Filled 2023-05-28: qty 100

## 2023-05-28 MED ORDER — TRANEXAMIC ACID-NACL 1000-0.7 MG/100ML-% IV SOLN
1000.0000 mg | Freq: Once | INTRAVENOUS | Status: AC
  Administered 2023-05-28: 1000 mg via INTRAVENOUS

## 2023-05-28 MED ORDER — PROPOFOL 10 MG/ML IV BOLUS
INTRAVENOUS | Status: DC | PRN
  Administered 2023-05-28: 20 mg via INTRAVENOUS

## 2023-05-28 MED ORDER — PROPOFOL 500 MG/50ML IV EMUL
INTRAVENOUS | Status: DC | PRN
  Administered 2023-05-28: 80 ug/kg/min via INTRAVENOUS

## 2023-05-28 MED ORDER — FENTANYL CITRATE (PF) 100 MCG/2ML IJ SOLN
INTRAMUSCULAR | Status: AC
  Filled 2023-05-28: qty 2

## 2023-05-28 MED ORDER — METOCLOPRAMIDE HCL 5 MG PO TABS
10.0000 mg | ORAL_TABLET | Freq: Three times a day (TID) | ORAL | Status: DC
  Administered 2023-05-28 – 2023-05-29 (×2): 10 mg via ORAL
  Filled 2023-05-28 (×2): qty 2

## 2023-05-28 MED ORDER — CELECOXIB 200 MG PO CAPS
ORAL_CAPSULE | ORAL | Status: AC
  Filled 2023-05-28: qty 2

## 2023-05-28 MED ORDER — TRANEXAMIC ACID-NACL 1000-0.7 MG/100ML-% IV SOLN
INTRAVENOUS | Status: AC
  Filled 2023-05-28: qty 100

## 2023-05-28 MED ORDER — CHLORHEXIDINE GLUCONATE 0.12 % MT SOLN
OROMUCOSAL | Status: AC
  Filled 2023-05-28: qty 15

## 2023-05-28 MED ORDER — BUPIVACAINE LIPOSOME 1.3 % IJ SUSP
INTRAMUSCULAR | Status: AC
  Filled 2023-05-28: qty 20

## 2023-05-28 MED ORDER — SENNOSIDES-DOCUSATE SODIUM 8.6-50 MG PO TABS
1.0000 | ORAL_TABLET | Freq: Two times a day (BID) | ORAL | Status: DC
  Administered 2023-05-28 – 2023-05-29 (×2): 1 via ORAL
  Filled 2023-05-28 (×2): qty 1

## 2023-05-28 MED ORDER — METOPROLOL SUCCINATE ER 50 MG PO TB24
50.0000 mg | ORAL_TABLET | Freq: Every day | ORAL | Status: DC
  Administered 2023-05-29: 50 mg via ORAL
  Filled 2023-05-28: qty 1

## 2023-05-28 MED ORDER — ONDANSETRON HCL 4 MG/2ML IJ SOLN
INTRAMUSCULAR | Status: DC | PRN
  Administered 2023-05-28: 4 mg via INTRAVENOUS

## 2023-05-28 MED ORDER — ACETAMINOPHEN 325 MG PO TABS: 325.0000 mg | ORAL_TABLET | Freq: Four times a day (QID) | ORAL | Status: DC | PRN

## 2023-05-28 MED ORDER — LOSARTAN POTASSIUM 25 MG PO TABS
25.0000 mg | ORAL_TABLET | Freq: Every day | ORAL | Status: DC
  Administered 2023-05-29: 25 mg via ORAL
  Filled 2023-05-28: qty 1

## 2023-05-28 MED ORDER — FLAXSEED OIL 1000 MG PO CAPS: 1.0000 | ORAL_CAPSULE | Freq: Every day | ORAL | Status: DC

## 2023-05-28 MED ORDER — MIDAZOLAM HCL 5 MG/5ML IJ SOLN
INTRAMUSCULAR | Status: DC | PRN
  Administered 2023-05-28: 2 mg via INTRAVENOUS

## 2023-05-28 MED ORDER — ALUM & MAG HYDROXIDE-SIMETH 200-200-20 MG/5ML PO SUSP: 30.0000 mL | ORAL | Status: DC | PRN

## 2023-05-28 MED ORDER — INSULIN ASPART 100 UNIT/ML IJ SOLN
0.0000 [IU] | Freq: Three times a day (TID) | INTRAMUSCULAR | Status: DC
  Administered 2023-05-29: 3 [IU] via SUBCUTANEOUS
  Filled 2023-05-28: qty 1

## 2023-05-28 MED ORDER — MENTHOL 3 MG MT LOZG: 1.0000 | LOZENGE | OROMUCOSAL | Status: DC | PRN

## 2023-05-28 MED ORDER — FENTANYL CITRATE (PF) 100 MCG/2ML IJ SOLN: 25.0000 ug | INTRAMUSCULAR | Status: DC | PRN

## 2023-05-28 MED ORDER — GABAPENTIN 300 MG PO CAPS
ORAL_CAPSULE | ORAL | Status: AC
  Filled 2023-05-28: qty 1

## 2023-05-28 MED ORDER — SODIUM CHLORIDE (PF) 0.9 % IJ SOLN
INTRAMUSCULAR | Status: AC
  Filled 2023-05-28: qty 50

## 2023-05-28 MED ORDER — SURGIRINSE WOUND IRRIGATION SYSTEM - OPTIME: TOPICAL | Status: DC | PRN

## 2023-05-28 MED ORDER — FERROUS SULFATE 325 (65 FE) MG PO TABS
325.0000 mg | ORAL_TABLET | Freq: Two times a day (BID) | ORAL | Status: DC
  Administered 2023-05-29: 325 mg via ORAL
  Filled 2023-05-28: qty 1

## 2023-05-28 MED ORDER — FLEET ENEMA RE ENEM: 1.0000 | ENEMA | Freq: Once | RECTAL | Status: DC | PRN

## 2023-05-28 MED ORDER — PANTOPRAZOLE SODIUM 40 MG PO TBEC
40.0000 mg | DELAYED_RELEASE_TABLET | Freq: Two times a day (BID) | ORAL | Status: DC
  Administered 2023-05-28 – 2023-05-29 (×2): 40 mg via ORAL
  Filled 2023-05-28 (×2): qty 1

## 2023-05-28 MED ORDER — EPHEDRINE 5 MG/ML INJ
INTRAVENOUS | Status: AC
  Filled 2023-05-28: qty 5

## 2023-05-28 MED ORDER — APIXABAN 5 MG PO TABS
5.0000 mg | ORAL_TABLET | Freq: Two times a day (BID) | ORAL | Status: DC
  Administered 2023-05-29: 5 mg via ORAL
  Filled 2023-05-28: qty 1

## 2023-05-28 MED ORDER — TORSEMIDE 20 MG PO TABS: 20.0000 mg | ORAL_TABLET | Freq: Every day | ORAL | Status: DC | PRN

## 2023-05-28 MED ORDER — CEFAZOLIN SODIUM-DEXTROSE 2-4 GM/100ML-% IV SOLN
INTRAVENOUS | Status: AC
  Filled 2023-05-28: qty 100

## 2023-05-28 MED ORDER — LIDOCAINE HCL (CARDIAC) PF 100 MG/5ML IV SOSY
PREFILLED_SYRINGE | INTRAVENOUS | Status: DC | PRN
  Administered 2023-05-28: 30 mg via INTRAVENOUS

## 2023-05-28 MED ORDER — ATORVASTATIN CALCIUM 80 MG PO TABS
80.0000 mg | ORAL_TABLET | Freq: Every day | ORAL | Status: DC
  Administered 2023-05-28: 80 mg via ORAL
  Filled 2023-05-28: qty 1

## 2023-05-28 MED ORDER — MAGNESIUM HYDROXIDE 400 MG/5ML PO SUSP
30.0000 mL | Freq: Every day | ORAL | Status: DC
  Administered 2023-05-28 – 2023-05-29 (×2): 30 mL via ORAL
  Filled 2023-05-28 (×2): qty 30

## 2023-05-28 MED ORDER — SODIUM CHLORIDE (PF) 0.9 % IJ SOLN
INTRAMUSCULAR | Status: DC | PRN
  Administered 2023-05-28: 120 mL via INTRAMUSCULAR

## 2023-05-28 MED ORDER — BUPIVACAINE HCL (PF) 0.5 % IJ SOLN
INTRAMUSCULAR | Status: AC
Start: 2023-05-28 — End: ?
  Filled 2023-05-28: qty 10

## 2023-05-28 MED ORDER — GLIPIZIDE ER 2.5 MG PO TB24
2.5000 mg | ORAL_TABLET | Freq: Every day | ORAL | Status: DC
  Administered 2023-05-29: 2.5 mg via ORAL
  Filled 2023-05-28: qty 1

## 2023-05-28 MED ORDER — TRAMADOL HCL 50 MG PO TABS
50.0000 mg | ORAL_TABLET | ORAL | Status: DC | PRN
  Administered 2023-05-28: 50 mg via ORAL
  Filled 2023-05-28: qty 1

## 2023-05-28 MED ORDER — ACETAMINOPHEN 10 MG/ML IV SOLN
INTRAVENOUS | Status: DC | PRN
  Administered 2023-05-28: 1000 mg via INTRAVENOUS

## 2023-05-28 MED ORDER — DIPHENHYDRAMINE HCL 12.5 MG/5ML PO ELIX: 12.5000 mg | ORAL_SOLUTION | ORAL | Status: DC | PRN

## 2023-05-28 MED ORDER — PROPOFOL 1000 MG/100ML IV EMUL
INTRAVENOUS | Status: AC
  Filled 2023-05-28: qty 100

## 2023-05-28 MED ORDER — INSULIN ASPART 100 UNIT/ML IJ SOLN
0.0000 [IU] | Freq: Every day | INTRAMUSCULAR | Status: DC
  Administered 2023-05-28: 2 [IU] via SUBCUTANEOUS
  Filled 2023-05-28: qty 1

## 2023-05-28 MED ORDER — ACETAMINOPHEN 10 MG/ML IV SOLN
INTRAVENOUS | Status: AC
  Filled 2023-05-28: qty 100

## 2023-05-28 MED ORDER — EPHEDRINE SULFATE-NACL 50-0.9 MG/10ML-% IV SOSY
PREFILLED_SYRINGE | INTRAVENOUS | Status: DC | PRN
  Administered 2023-05-28: 10 mg via INTRAVENOUS

## 2023-05-28 MED ORDER — BUPIVACAINE HCL (PF) 0.25 % IJ SOLN
INTRAMUSCULAR | Status: AC
  Filled 2023-05-28: qty 60

## 2023-05-28 MED ORDER — PHENOL 1.4 % MT LIQD: 1.0000 | OROMUCOSAL | Status: DC | PRN

## 2023-05-28 MED ORDER — DEXAMETHASONE SODIUM PHOSPHATE 10 MG/ML IJ SOLN
INTRAMUSCULAR | Status: AC
  Filled 2023-05-28: qty 1

## 2023-05-28 MED ORDER — ACETAMINOPHEN 10 MG/ML IV SOLN
1000.0000 mg | Freq: Four times a day (QID) | INTRAVENOUS | Status: DC
Start: 2023-05-28 — End: 2023-05-29
  Administered 2023-05-28 – 2023-05-29 (×3): 1000 mg via INTRAVENOUS
  Filled 2023-05-28 (×3): qty 100

## 2023-05-28 MED ORDER — BUPIVACAINE HCL (PF) 0.5 % IJ SOLN
INTRAMUSCULAR | Status: DC | PRN
  Administered 2023-05-28: 3 mL

## 2023-05-28 MED ORDER — SODIUM CHLORIDE 0.9 % IR SOLN
Status: DC | PRN
  Administered 2023-05-28: 3000 mL

## 2023-05-28 MED ORDER — BISACODYL 10 MG RE SUPP: 10.0000 mg | Freq: Every day | RECTAL | Status: DC | PRN

## 2023-05-28 MED ORDER — MIDAZOLAM HCL 2 MG/2ML IJ SOLN
INTRAMUSCULAR | Status: AC
  Filled 2023-05-28: qty 2

## 2023-05-28 MED ORDER — FENTANYL CITRATE (PF) 100 MCG/2ML IJ SOLN
INTRAMUSCULAR | Status: DC | PRN
  Administered 2023-05-28: 25 ug via INTRAVENOUS

## 2023-05-28 SURGICAL SUPPLY — 65 items
ATTUNE PSFEM RTSZ5 NARCEM KNEE (Femur) IMPLANT
ATTUNE PSRP INSR SZ5 5 KNEE (Insert) IMPLANT
BASEPLATE TIBIAL ROTATING SZ 4 (Knees) IMPLANT
BATTERY INSTRU NAVIGATION (MISCELLANEOUS) ×4 IMPLANT
BIT DRILL QUICK REL 1/8 2PK SL (BIT) ×1 IMPLANT
BLADE CLIPPER SURG (BLADE) IMPLANT
BLADE SAW 70X12.5 (BLADE) ×1 IMPLANT
BLADE SAW 90X13X1.19 OSCILLAT (BLADE) ×1 IMPLANT
BLADE SAW 90X25X1.19 OSCILLAT (BLADE) ×1 IMPLANT
BONE CEMENT GENTAMICIN (Cement) ×2 IMPLANT
BRUSH SCRUB EZ PLAIN DRY (MISCELLANEOUS) ×1 IMPLANT
CEMENT BONE GENTAMICIN 40 (Cement) IMPLANT
COOLER POLAR GLACIER W/PUMP (MISCELLANEOUS) ×1 IMPLANT
CUFF TRNQT CYL 24X4X16.5-23 (TOURNIQUET CUFF) IMPLANT
CUFF TRNQT CYL 30X4X21-28X (TOURNIQUET CUFF) IMPLANT
DRAPE SHEET LG 3/4 BI-LAMINATE (DRAPES) ×1 IMPLANT
DRSG AQUACEL AG ADV 3.5X14 (GAUZE/BANDAGES/DRESSINGS) ×1 IMPLANT
DRSG MEPILEX SACRM 8.7X9.8 (GAUZE/BANDAGES/DRESSINGS) ×1 IMPLANT
DRSG TEGADERM 4X4.75 (GAUZE/BANDAGES/DRESSINGS) ×1 IMPLANT
DURAPREP 26ML APPLICATOR (WOUND CARE) ×2 IMPLANT
ELECT CAUTERY BLADE 6.4 (BLADE) ×1 IMPLANT
ELECT REM PT RETURN 9FT ADLT (ELECTROSURGICAL) ×1 IMPLANT
ELECTRODE REM PT RTRN 9FT ADLT (ELECTROSURGICAL) ×1 IMPLANT
EVACUATOR 1/8 PVC DRAIN (DRAIN) ×1 IMPLANT
EX-PIN ORTHOLOCK NAV 4X150 (PIN) ×2 IMPLANT
GAUZE XEROFORM 1X8 LF (GAUZE/BANDAGES/DRESSINGS) ×1 IMPLANT
GLOVE BIOGEL M STRL SZ7.5 (GLOVE) ×6 IMPLANT
GLOVE SURG UNDER POLY LF SZ8 (GLOVE) ×2 IMPLANT
GOWN STRL REUS W/ TWL LRG LVL3 (GOWN DISPOSABLE) ×1 IMPLANT
GOWN STRL REUS W/ TWL XL LVL3 (GOWN DISPOSABLE) ×1 IMPLANT
GOWN TOGA ZIPPER T7+ PEEL AWAY (MISCELLANEOUS) ×1 IMPLANT
HOLDER FOLEY CATH W/STRAP (MISCELLANEOUS) ×1 IMPLANT
HOOD PEEL AWAY T7 (MISCELLANEOUS) ×1 IMPLANT
KIT TURNOVER KIT A (KITS) ×1 IMPLANT
KNIFE SCULPS 14X20 (INSTRUMENTS) ×1 IMPLANT
MANIFOLD NEPTUNE II (INSTRUMENTS) ×2 IMPLANT
NDL SPNL 20GX3.5 QUINCKE YW (NEEDLE) ×2 IMPLANT
NEEDLE SPNL 20GX3.5 QUINCKE YW (NEEDLE) ×2 IMPLANT
PACK TOTAL KNEE (MISCELLANEOUS) ×1 IMPLANT
PAD ABD DERMACEA PRESS 5X9 (GAUZE/BANDAGES/DRESSINGS) ×2 IMPLANT
PAD ARMBOARD POSITIONER FOAM (MISCELLANEOUS) ×3 IMPLANT
PAD WRAPON POLAR KNEE (MISCELLANEOUS) ×1 IMPLANT
PATELLA MEDIAL ATTUN 35MM KNEE (Knees) IMPLANT
PENCIL SMOKE EVACUATOR COATED (MISCELLANEOUS) ×1 IMPLANT
PIN DRILL FIX HALF THREAD (BIT) ×2 IMPLANT
PIN FIXATION 1/8DIA X 3INL (PIN) ×1 IMPLANT
PULSAVAC PLUS IRRIG FAN TIP (DISPOSABLE) ×1 IMPLANT
SOL .9 NS 3000ML IRR UROMATIC (IV SOLUTION) ×1 IMPLANT
SOLUTION IRRIG SURGIPHOR (IV SOLUTION) ×1 IMPLANT
SPONGE DRAIN TRACH 4X4 STRL 2S (GAUZE/BANDAGES/DRESSINGS) ×1 IMPLANT
STAPLER SKIN PROX 35W (STAPLE) ×1 IMPLANT
STOCKINETTE STRL BIAS CUT 8X4 (MISCELLANEOUS) ×1 IMPLANT
STRAP TIBIA SHORT (MISCELLANEOUS) ×1 IMPLANT
SUCTION TUBE FRAZIER 10FR DISP (SUCTIONS) ×1 IMPLANT
SUT VIC AB 0 CT1 36 (SUTURE) ×1 IMPLANT
SUT VIC AB 1 CT1 36 (SUTURE) ×2 IMPLANT
SUT VIC AB 2-0 CT2 27 (SUTURE) ×1 IMPLANT
SYR 30ML LL (SYRINGE) ×2 IMPLANT
TIP FAN IRRIG PULSAVAC PLUS (DISPOSABLE) ×1 IMPLANT
TOWEL OR 17X26 4PK STRL BLUE (TOWEL DISPOSABLE) ×1 IMPLANT
TOWER CARTRIDGE SMART MIX (DISPOSABLE) ×1 IMPLANT
TRAP FLUID SMOKE EVACUATOR (MISCELLANEOUS) ×1 IMPLANT
TRAY FOLEY MTR SLVR 16FR STAT (SET/KITS/TRAYS/PACK) ×1 IMPLANT
WATER STERILE IRR 1000ML POUR (IV SOLUTION) ×1 IMPLANT
WRAPON POLAR PAD KNEE (MISCELLANEOUS) ×1 IMPLANT

## 2023-05-28 NOTE — Discharge Summary (Signed)
 Physician Discharge Summary  Subjective: 1 Day Post-Op Procedure(s) (LRB): COMPUTER ASSISTED TOTAL KNEE ARTHROPLASTY - RNFA (Right) Patient reports pain as mild.   Patient seen in rounds with Dr. Ernest Pine. Patient is well, and has had no acute complaints or problems. Denies any CP, SOB, N/V, fevers or chills We will start therapy today.  Patient is ready to go Home  Physician Discharge Summary  Patient ID: Bonnie Holt MRN: [id number] DOB/AGE: May 10, 1945 78 y.o.  Admit date: 05/28/2023 Discharge date: 05/29/2023  Admission Diagnoses:  Discharge Diagnoses:  Principal Problem:   History of total knee arthroplasty, right   Discharged Condition: good  Hospital Course: Patient presented to the hospital on 05/28/2023 for an elective right total knee arthroplasty performed by Dr. Ernest Pine. Patient was given 1g of TXA and 2g of Ancef prior to the procedure.  She tolerated the procedure well without any complications. See procedural note below for details. Postoperatively, the patient did very well.  She was able to pass PT protocols on post-op day one without any issues. JP drain was removed without any difficulty and was intact.  She was able to void her bladder without any difficulty. Physical exam was unremarkable.  She denies any SOB, CP, N/V, fevers or chills. Vital signs are stable. Patient is stable to discharge home.  PROCEDURE:  Right total knee arthroplasty using computer-assisted navigation   SURGEON:  Jena Gauss. M.D.   ANESTHESIA: spinal   ESTIMATED BLOOD LOSS: 50 mL   FLUIDS REPLACED: 400 mL of crystalloid   TOURNIQUET TIME: 85 minutes   DRAINS: 2 medium Hemovac drains   SOFT TISSUE RELEASES: Anterior cruciate ligament, posterior cruciate ligament, deep medial collateral ligament, patellofemoral ligament   IMPLANTS UTILIZED: DePuy Attune size 5N posterior stabilized femoral component (cemented), size 4 rotating platform tibial component (cemented), 35 mm  medialized dome patella (cemented), and a 5 mm stabilized rotating platform polyethylene insert.  Treatments: none  Discharge Exam: Blood pressure (!) 121/43, pulse (!) 55, temperature 97.8 F (36.6 C), resp. rate 16, height 5\' 2"  (1.575 m), weight 94.8 kg, SpO2 97%.   Disposition: home   Allergies as of 05/29/2023       Reactions   Levofloxacin Swelling   Swelling of joints   Ace Inhibitors Cough   Warfarin Other (See Comments), Swelling   weakness        Medication List     TAKE these medications    acetaminophen 650 MG CR tablet Commonly known as: TYLENOL Take 1,300 mg by mouth every 8 (eight) hours as needed for pain.   apixaban 5 MG Tabs tablet Commonly known as: ELIQUIS Take 5 mg by mouth 2 (two) times daily.   atorvastatin 80 MG tablet Commonly known as: LIPITOR Take 1 tablet by mouth daily.   Biotin 5000 MCG Caps Take 5,000 mcg by mouth daily.   CALCIUM + D PO Take 1 tablet by mouth daily.   CINNAMON PO Take 1,000 mg by mouth daily.   clobetasol cream 0.05 % Commonly known as: TEMOVATE Apply 1 Application topically every other day.   Co Q-10 100 MG Caps Take 1 capsule by mouth daily.   FLAXSEED OIL PO Take 1 capsule by mouth daily.   FreeStyle Libre 14 Day Sensor Misc Use 1 kit every 14 (fourteen) days   glipiZIDE 2.5 MG 24 hr tablet Commonly known as: GLUCOTROL XL Take 2.5 mg by mouth daily.   losartan 25 MG tablet Commonly known as: COZAAR Take 25 mg by  mouth daily.   metoprolol succinate 50 MG 24 hr tablet Commonly known as: TOPROL-XL Take 50 mg by mouth daily.   multivitamin with minerals Tabs tablet Take 1 tablet by mouth daily.   oxyCODONE 5 MG immediate release tablet Commonly known as: Roxicodone Take 1 tablet (5 mg total) by mouth every 4 (four) hours as needed for severe pain (pain score 7-10).   Semaglutide 14 MG Tabs Take 1 tablet by mouth daily. Am before other meds or before eating   Sennosides 25 MG Tabs Take  50 mg by mouth daily as needed (constipation).   torsemide 20 MG tablet Commonly known as: DEMADEX Take 20 mg by mouth daily as needed (swelling).   traMADol 50 MG tablet Commonly known as: ULTRAM Take 1-2 tablets (50-100 mg total) by mouth every 4 (four) hours as needed for moderate pain (pain score 4-6).   Vitamin D3 50 MCG (2000 UT) capsule Take 2,000 Units by mouth daily.               Durable Medical Equipment  (From admission, onward)           Start     Ordered   05/28/23 1819  DME Walker rolling  Once       Question:  Patient needs a walker to treat with the following condition  Answer:  Total knee replacement status   05/28/23 1818   05/28/23 1819  DME Bedside commode  Once       Comments: Patient is not able to walk the distance required to go the bathroom, or he/she is unable to safely negotiate stairs required to access the bathroom.  A 3in1 BSC will alleviate this problem  Question:  Patient needs a bedside commode to treat with the following condition  Answer:  Total knee replacement status   05/28/23 1818            Follow-up Information     Rayburn Go, PA-C Follow up on 06/11/2023.   Specialty: Orthopedic Surgery Why: at 9:45am Contact information: [address] [place] [address] [phone]         Donato Heinz, MD Follow up on 07/13/2023.   Specialty: Orthopedic Surgery Why: at 10:15am Contact information: [address] [facility] [place] [address] [phone]                 Signed: Gean Birchwood 05/29/2023, 9:29 AM   Objective: Vital signs in last 24 hours: Temp:  [97 F (36.1 C)-97.9 F (36.6 C)] 97.8 F (36.6 C) (03/15 0728) Pulse Rate:  [55-71] 55 (03/15 0728) Resp:  [11-18] 16 (03/15 0728) BP: (104-134)/(42-73) 121/43 (03/15 0728) SpO2:  [95 %-99 %] 97 % (03/15 0728) Weight:  [94.8 kg] 94.8 kg (03/14 0938)  Intake/Output from previous day:  Intake/Output Summary  (Last 24 hours) at 05/29/2023 0929 Last data filed at 05/29/2023 0600 Gross per 24 hour  Intake 2170 ml  Output 2020 ml  Net 150 ml    Intake/Output this shift: No intake/output data recorded.  Labs: No results for input(s): "HGB" in the last 72 hours. No results for input(s): "WBC", "RBC", "HCT", "PLT" in the last 72 hours. No results for input(s): "NA", "K", "CL", "CO2", "BUN", "CREATININE", "GLUCOSE", "CALCIUM" in the last 72 hours. No results for input(s): "LABPT", "INR" in the last 72 hours.  EXAM: General - Patient is Alert, Appropriate, and Oriented Extremity - Neurologically intact Neurovascular intact Sensation intact distally Intact pulses distally Dorsiflexion/Plantar  flexion intact No cellulitis present Compartment soft Dressing - dressing C/D/I and no drainage Motor Function - intact, moving foot and toes well on exam.  JP Drain pulled without difficulty. Intact  Assessment/Plan: 1 Day Post-Op Procedure(s) (LRB): COMPUTER ASSISTED TOTAL KNEE ARTHROPLASTY - RNFA (Right) Procedure(s) (LRB): COMPUTER ASSISTED TOTAL KNEE ARTHROPLASTY - RNFA (Right) Past Medical History:  Diagnosis Date   (HFpEF) heart failure with preserved ejection fraction (HCC) 06/28/2017   a.) TTE 06/28/2017: EF 50%, mild LVH, mild LAE, triv PR, mild MR/TR, G1DD.   Arthritis    ASCUS with positive high risk HPV cervical    Basal cell carcinoma of skin    Bradycardia    Breast cancer, left (HCC) 11/19/2016   a.) stage IA (cT1b, cN0, cM0, G2, ER+, PR+, HER2-); OncotypeDx score = 6; s/p lumpectomy 12/16/2016 + adjuvant XRT (completed 03/20/2017) + 5 years endocrine (letrozole) therapy (completes 03/2022)   Cervicalgia    Chronic ischemic heart disease    CKD (chronic kidney disease), stage IV (HCC)    Coronary artery disease    a.) LHC/PCI in 2012 in [place]; BMS x 1 placed (unknown type/location)   Dermatomycosis    Dyspnea    HLD (hyperlipidemia)    Hypertension    Left bundle branch block  (LBBB)    Long term current use of aromatase inhibitor    a.) letrozole; completes 5 years of therapy in 03/2022   On apixaban therapy    OSA treated with BiPAP    Osteopenia    PAF (paroxysmal atrial fibrillation) (HCC)    a.) CHA2DS2-VASc = 5 (age x2, sex, HTN, T2DM); b.) rate/rhythm maintained on oral metoprolol succinate; chronically anticoagulated using standard dose apixaban   Personal history of radiation therapy    a.) for LEFT breast cancer; completed 03/2017   Tick fever    Type 2 diabetes mellitus treated with insulin (HCC)    a.) has CGM in place   Urinary incontinence    Principal Problem:   History of total knee arthroplasty, right  Estimated body mass index is 38.23 kg/m as calculated from the following:   Height as of this encounter: 5\' 2"  (1.575 m).   Weight as of this encounter: 94.8 kg.  Patient will continue to work with physical therapy on gait, ROM and strengthening  Discussed with the patient continuing to utilize Polar Care  Patient will use bone foam in 20-30 minute intervals  Patient will wear TED hose bilaterally to help prevent DVT and clot formation  Discussed the Aquacel bandage.  This bandage will stay in place 7 days postoperatively.  Can be replaced with honeycomb bandages that will be sent home with the patient  Discussed sending the patient home with tramadol and oxycodone for as needed pain management.  Patient will continue on at home Eliquis for DVT prophylaxis  JP drain removed without difficulty, intact  Weight-Bearing as tolerated to right leg  Patient will follow-up with [facility] clinic orthopedics in 2 weeks for staple removal and reevaluation  Diet - Diabetic diet Follow up - in 2 weeks Activity - WBAT Disposition - Home Condition Upon Discharge - Good DVT Prophylaxis - TED hose and Eliquis  Danise Edge, PA-C Orthopaedic Surgery 05/29/2023, 9:29 AM

## 2023-05-28 NOTE — Interval H&P Note (Signed)
 History and Physical Interval Note:  05/28/2023 11:50 AM  Bonnie Holt  has presented today for surgery, with the diagnosis of Primary osteoarthritis of left knee.  The various methods of treatment have been discussed with the patient and family. After consideration of risks, benefits and other options for treatment, the patient has consented to  Procedure(s): COMPUTER ASSISTED TOTAL KNEE ARTHROPLASTY - RNFA (Right) as a surgical intervention.  The patient's history has been reviewed, patient examined, no change in status, stable for surgery.  I have reviewed the patient's chart and labs.  Questions were answered to the patient's satisfaction.     Azarias Chiou P Andera Cranmer

## 2023-05-28 NOTE — Op Note (Signed)
 OPERATIVE NOTE  DATE OF SURGERY:  05/28/2023  PATIENT NAME:  POLLYANN ROA   DOB: Jul 05, 1945  MRN: [id number]  PRE-OPERATIVE DIAGNOSIS: Degenerative arthrosis of the right knee, primary  POST-OPERATIVE DIAGNOSIS:  Same  PROCEDURE:  Right total knee arthroplasty using computer-assisted navigation  SURGEON:  Jena Gauss. M.D.  ANESTHESIA: spinal  ESTIMATED BLOOD LOSS: 50 mL  FLUIDS REPLACED: 400 mL of crystalloid  TOURNIQUET TIME: 85 minutes  DRAINS: 2 medium Hemovac drains  SOFT TISSUE RELEASES: Anterior cruciate ligament, posterior cruciate ligament, deep medial collateral ligament, patellofemoral ligament  IMPLANTS UTILIZED: DePuy Attune size 5N posterior stabilized femoral component (cemented), size 4 rotating platform tibial component (cemented), 35 mm medialized dome patella (cemented), and a 5 mm stabilized rotating platform polyethylene insert.  INDICATIONS FOR SURGERY: JAYLEN CLAUDE is a 78 y.o. year old female with a long history of progressive knee pain. X-rays demonstrated severe degenerative changes in tricompartmental fashion. The patient had not seen any significant improvement despite conservative nonsurgical intervention. After discussion of the risks and benefits of surgical intervention, the patient expressed understanding of the risks benefits and agree with plans for total knee arthroplasty.   The risks, benefits, and alternatives were discussed at length including but not limited to the risks of infection, bleeding, nerve injury, stiffness, blood clots, the need for revision surgery, cardiopulmonary complications, among others, and they were willing to proceed.  PROCEDURE IN DETAIL: The patient was brought into the operating room and, after adequate spinal anesthesia was achieved, a tourniquet was placed on the patient's upper thigh. The patient's knee and leg were cleaned and prepped with alcohol and DuraPrep and draped in the usual sterile fashion. A  "timeout" was performed as per usual protocol. The lower extremity was exsanguinated using an Esmarch, and the tourniquet was inflated to 300 mmHg. An anterior longitudinal incision was made followed by a standard mid vastus approach. The deep fibers of the medial collateral ligament were elevated in a subperiosteal fashion off of the medial flare of the tibia so as to maintain a continuous soft tissue sleeve. The patella was subluxed laterally and the patellofemoral ligament was incised. Inspection of the knee demonstrated severe degenerative changes with full-thickness loss of articular cartilage. Osteophytes were debrided using a rongeur. Anterior and posterior cruciate ligaments were excised. Two 4.0 mm Schanz pins were inserted in the femur and into the tibia for attachment of the array of trackers used for computer-assisted navigation. Hip center was identified using a circumduction technique. Distal landmarks were mapped using the computer. The distal femur and proximal tibia were mapped using the computer. The distal femoral cutting guide was positioned using computer-assisted navigation so as to achieve a 5 distal valgus cut. The femur was sized and it was felt that a size 5N femoral component was appropriate. A size 5 femoral cutting guide was positioned and the anterior cut was performed and verified using the computer. This was followed by completion of the posterior and chamfer cuts. Femoral cutting guide for the central box was then positioned in the center box cut was performed.  Attention was then directed to the proximal tibia. Medial and lateral menisci were excised. The extramedullary tibial cutting guide was positioned using computer-assisted navigation so as to achieve a 0 varus-valgus alignment and 3 posterior slope. The cut was performed and verified using the computer. The proximal tibia was sized and it was felt that a size 4 tibial tray was appropriate. Tibial and femoral trials were  inserted followed  by insertion of a 5 mm polyethylene insert. This allowed for excellent mediolateral soft tissue balancing both in flexion and in full extension. Finally, the patella was cut and prepared so as to accommodate a 35 mm medialized dome patella. A patella trial was placed and the knee was placed through a range of motion with excellent patellar tracking appreciated. The femoral trial was removed after debridement of posterior osteophytes. The central post-hole for the tibial component was reamed followed by insertion of a keel punch. Tibial trials were then removed. Cut surfaces of bone were irrigated with copious amounts of normal saline using pulsatile lavage and then suctioned dry. Polymethylmethacrylate cement with gentamicin was prepared in the usual fashion using a vacuum mixer. Cement was applied to the cut surface of the proximal tibia as well as along the undersurface of a size 4 rotating platform tibial component. Tibial component was positioned and impacted into place. Excess cement was removed using Personal assistant. Cement was then applied to the cut surfaces of the femur as well as along the posterior flanges of the size 5N femoral component. The femoral component was positioned and impacted into place. Excess cement was removed using Personal assistant. A 5 mm polyethylene trial was inserted and the knee was brought into full extension with steady axial compression applied. Finally, cement was applied to the backside of a 35 mm medialized dome patella and the patellar component was positioned and patellar clamp applied. Excess cement was removed using Personal assistant. After adequate curing of the cement, the tourniquet was deflated after a total tourniquet time of 85 minutes. Hemostasis was achieved using electrocautery. The knee was irrigated with copious amounts of normal saline using pulsatile lavage followed by 450 ml of Surgiphor and then suctioned dry. 20 mL of 1.3% Exparel and 60 mL of  0.25% Marcaine in 40 mL of normal saline was injected along the posterior capsule, medial and lateral gutters, and along the arthrotomy site. A 5 mm stabilized rotating platform polyethylene insert was inserted and the knee was placed through a range of motion with excellent mediolateral soft tissue balancing appreciated and excellent patellar tracking noted. 2 medium drains were placed in the wound bed and brought out through separate stab incisions. The medial parapatellar portion of the incision was reapproximated using interrupted sutures of #1 Vicryl. Subcutaneous tissue was approximated in layers using first #0 Vicryl followed #2-0 Vicryl. The skin was approximated with skin staples. A sterile dressing was applied.  The patient tolerated the procedure well and was transported to the recovery room in stable condition.    Calisha Tindel P. Angie Fava., M.D.

## 2023-05-28 NOTE — Transfer of Care (Signed)
 Immediate Anesthesia Transfer of Care Note  Patient: Bonnie Holt  Procedure(s) Performed: COMPUTER ASSISTED TOTAL KNEE ARTHROPLASTY - RNFA (Right: Knee)  Patient Location: PACU  Anesthesia Type:Spinal  Level of Consciousness: drowsy  Airway & Oxygen Therapy: Patient Spontanous Breathing and Patient connected to face mask oxygen  Post-op Assessment: Report given to RN and Post -op Vital signs reviewed and stable  Post vital signs: Reviewed and stable  Last Vitals:  Vitals Value Taken Time  BP 134/67 05/28/23 1549  Temp 35.9 1549  Pulse 72 05/28/23 1553  Resp 21 05/28/23 1553  SpO2 99 % 05/28/23 1553  Vitals shown include unfiled device data.  Last Pain:  Vitals:   05/28/23 0938  TempSrc: Tympanic  PainSc: 0-No pain         Complications: No notable events documented.

## 2023-05-28 NOTE — Progress Notes (Signed)
 Patient is not able to walk the distance required to go the bathroom, or he/she is unable to safely negotiate stairs required to access the bathroom.  A 3in1 BSC will alleviate this problem   Amenda Duclos P. Angie Fava M.D.

## 2023-05-28 NOTE — Progress Notes (Signed)
 PHARMACIST - PHYSICIAN ORDER COMMUNICATION  CONCERNING: P&T Medication Policy on Herbal Medications  DESCRIPTION:  This patient's order for:  flaxseed oil caps 1000 mg daily  has been noted.  This product(s) is classified as an "herbal" or natural product. Due to a lack of definitive safety studies or FDA approval, nonstandard manufacturing practices, plus the potential risk of unknown drug-drug interactions while on inpatient medications, the Pharmacy and Therapeutics Committee does not permit the use of "herbal" or natural products of this type within [facility].   ACTION TAKEN: The pharmacy department is unable to verify this order at this time and your patient has been informed of this safety policy. Please reevaluate patient's clinical condition at discharge and address if the herbal or natural product(s) should be resumed at that time.

## 2023-05-28 NOTE — Anesthesia Preprocedure Evaluation (Signed)
 Anesthesia Evaluation  Patient identified by MRN, date of birth, ID band Patient awake    Reviewed: Allergy & Precautions, H&P , NPO status , Patient's Chart, lab work & pertinent test results, reviewed documented beta blocker date and time   History of Anesthesia Complications Negative for: history of anesthetic complications  Airway Mallampati: II   Neck ROM: full    Dental  (+) Poor Dentition, Dental Advidsory Given Permanent bridge on the top front:   Pulmonary neg shortness of breath, sleep apnea and Continuous Positive Airway Pressure Ventilation , neg COPD, neg recent URI   Pulmonary exam normal        Cardiovascular Exercise Tolerance: Poor hypertension, On Medications (-) angina + CAD, + Cardiac Stents and +CHF  (-) Past MI + dysrhythmias Atrial Fibrillation (-) Valvular Problems/Murmurs Rhythm:regular Rate:Normal     Neuro/Psych negative neurological ROS  negative psych ROS   GI/Hepatic Neg liver ROS, PUD,,,  Endo/Other  diabetes, Well Controlled, Type 2, Oral Hypoglycemic Agents    Renal/GU Renal disease  negative genitourinary   Musculoskeletal   Abdominal   Peds  Hematology negative hematology ROS (+)   Anesthesia Other Findings Past Medical History: No date: Arthritis No date: ASCUS with positive high risk HPV cervical No date: Basal cell carcinoma of skin No date: Bradycardia 11/19/2016: Breast cancer, left (HCC)     Comment:  a.) stage IA (cT1b, cN0, cM0, G2, ER+, PR+, HER2-);               OncotypeDx score = 6; s/p lumpectomy 12/16/2016 +               adjuvant XRT (completed 03/20/2017) + 5 years endocrine               (letrozole) therapy (completes 03/2022) No date: Chronic ischemic heart disease No date: CKD (chronic kidney disease), stage IV (HCC) No date: Coronary artery disease     Comment:  a.) LHC/PCI in 2012 in [place]; BMS x 1 placed (unknown               type/location) 06/28/2017:  Diastolic dysfunction     Comment:  a.) TTE 06/28/2017: EF 50%, mild LVH, mild LAE, triv PR,              mild MR/TR, G1DD. No date: HLD (hyperlipidemia) No date: Hypertension No date: Left bundle branch block (LBBB) No date: Long term current use of anticoagulant     Comment:  a.) apixaban No date: Long term current use of aromatase inhibitor     Comment:  a.) letrozole; completes 5 years of therapy in 03/2022 No date: OSA treated with BiPAP No date: Osteopenia No date: PAF (paroxysmal atrial fibrillation) (HCC)     Comment:  a.) CHA2DS2-VASc = 5 (age x2, sex, HTN, T2DM); b.)               rate/rhythm maintained on oral amiodarone + metoprolol               succinate; chronically anticoagulated using standard dose              apixaban No date: Personal history of radiation therapy     Comment:  a.) for LEFT breast cancer; completed 03/2017 No date: Tick fever No date: Type 2 diabetes mellitus treated with insulin (HCC)     Comment:  a.) has CGM in place Past Surgical History: 11/19/2016: BREAST BIOPSY; Left     Comment:  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 11/19/2016: BREAST BIOPSY; Left     Comment:  wing clip 2:30 4 cmfn Invasive mammary carcinoma 11/19/2016: BREAST BIOPSY; Left     Comment:  ribbon clip 3:00 6cmfn Invasive mammary carcinoma 12/03/2016: BREAST BIOPSY; Left     Comment:  LN biopsy, negative 11/17/2017: BREAST BIOPSY; Left     Comment:  affirm bx of calcs, x clip -FIBROSIS WITH DYSTROPHIC               CALCIFICATIONS 12/16/2016: BREAST LUMPECTOMY; Left     Comment:  excision of all 3 sites, LN negative 2012: CORONARY ANGIOPLASTY WITH STENT PLACEMENT; Left     Comment:  [employer] 09/09/2017: DIALYSIS/PERMA CATHETER INSERTION; N/A     Comment:  Procedure: DIALYSIS/PERMA CATHETER INSERTION;  Surgeon:               Annice Needy, MD;  Location: [facility] INVASIVE CV LAB;                Service: Cardiovascular;  Laterality: N/A; 10/04/2017: DIALYSIS/PERMA CATHETER  REMOVAL; N/A     Comment:  Procedure: DIALYSIS/PERMA CATHETER REMOVAL;  Surgeon:               Annice Needy, MD;  Location: [facility] INVASIVE CV LAB;                Service: Cardiovascular;  Laterality: N/A; 09/07/2017: ESOPHAGOGASTRODUODENOSCOPY (EGD) WITH PROPOFOL; N/A     Comment:  Procedure: ESOPHAGOGASTRODUODENOSCOPY (EGD) WITH               PROPOFOL;  Surgeon: Midge Minium, MD;  Location: [facility]               ENDOSCOPY;  Service: Endoscopy;  Laterality: N/A; 12/16/2016: PARTIAL MASTECTOMY WITH NEEDLE LOCALIZATION; Left     Comment:  Procedure: PARTIAL MASTECTOMY WITH NEEDLE LOCALIZATION;               Surgeon: Carolan Shiver, MD;  Location: [facility] ORS;               Service: General;  Laterality: Left; 12/16/2016: SENTINEL NODE BIOPSY; Left     Comment:  Procedure: SENTINEL NODE BIOPSY;  Surgeon: Carolan Shiver, MD;  Location: [facility] ORS;  Service: General;                Laterality: Left; BMI    Body Mass Index: 38.23 kg/m     Reproductive/Obstetrics negative OB ROS                             Anesthesia Physical Anesthesia Plan  ASA: 3  Anesthesia Plan: Spinal   Post-op Pain Management:    Induction: Intravenous  PONV Risk Score and Plan: 3 and Propofol infusion, TIVA and Treatment may vary due to age or medical condition  Airway Management Planned: Natural Airway and Simple Face Mask  Additional Equipment:   Intra-op Plan:   Post-operative Plan:   Informed Consent: I have reviewed the patients History and Physical, chart, labs and discussed the procedure including the risks, benefits and alternatives for the proposed anesthesia with the patient or authorized representative who has indicated his/her understanding and acceptance.     Dental Advisory Given  Plan Discussed with: CRNA  Anesthesia Plan Comments:         Anesthesia Quick Evaluation

## 2023-05-28 NOTE — Progress Notes (Signed)
 Subjective: 1 Day Post-Op Procedure(s) (LRB): COMPUTER ASSISTED TOTAL KNEE ARTHROPLASTY - RNFA (Right) Patient reports pain as mild.   Patient seen in rounds with Dr. Ernest Pine. Patient is well, and has had no acute complaints or problems. Denies any CP, SOB, N/V, fevers or chills We will start therapy today.  Plan is to go Home after hospital stay.  Objective: Vital signs in last 24 hours: Temp:  [97 F (36.1 C)-97.9 F (36.6 C)] 97.8 F (36.6 C) (03/15 0728) Pulse Rate:  [55-71] 55 (03/15 0728) Resp:  [11-18] 16 (03/15 0728) BP: (104-134)/(42-73) 121/43 (03/15 0728) SpO2:  [95 %-99 %] 97 % (03/15 0728) Weight:  [94.8 kg] 94.8 kg (03/14 0938)  Intake/Output from previous day:  Intake/Output Summary (Last 24 hours) at 05/29/2023 0926 Last data filed at 05/29/2023 0600 Gross per 24 hour  Intake 2170 ml  Output 2020 ml  Net 150 ml    Intake/Output this shift: No intake/output data recorded.  Labs: No results for input(s): "HGB" in the last 72 hours. No results for input(s): "WBC", "RBC", "HCT", "PLT" in the last 72 hours. No results for input(s): "NA", "K", "CL", "CO2", "BUN", "CREATININE", "GLUCOSE", "CALCIUM" in the last 72 hours. No results for input(s): "LABPT", "INR" in the last 72 hours.  EXAM General - Patient is Alert, Appropriate, and Oriented Extremity - Neurologically intact Neurovascular intact Sensation intact distally Intact pulses distally Dorsiflexion/Plantar flexion intact No cellulitis present Compartment soft Dressing - dressing C/D/I and no drainage Motor Function - intact, moving foot and toes well on exam.  JP Drain pulled without difficulty. Intact  Past Medical History:  Diagnosis Date   (HFpEF) heart failure with preserved ejection fraction (HCC) 06/28/2017   a.) TTE 06/28/2017: EF 50%, mild LVH, mild LAE, triv PR, mild MR/TR, G1DD.   Arthritis    ASCUS with positive high risk HPV cervical    Basal cell carcinoma of skin    Bradycardia     Breast cancer, left (HCC) 11/19/2016   a.) stage IA (cT1b, cN0, cM0, G2, ER+, PR+, HER2-); OncotypeDx score = 6; s/p lumpectomy 12/16/2016 + adjuvant XRT (completed 03/20/2017) + 5 years endocrine (letrozole) therapy (completes 03/2022)   Cervicalgia    Chronic ischemic heart disease    CKD (chronic kidney disease), stage IV (HCC)    Coronary artery disease    a.) LHC/PCI in 2012 in ; BMS x 1 placed (unknown type/location)   Dermatomycosis    Dyspnea    HLD (hyperlipidemia)    Hypertension    Left bundle branch block (LBBB)    Long term current use of aromatase inhibitor    a.) letrozole; completes 5 years of therapy in 03/2022   On apixaban therapy    OSA treated with BiPAP    Osteopenia    PAF (paroxysmal atrial fibrillation) (HCC)    a.) CHA2DS2-VASc = 5 (age x2, sex, HTN, T2DM); b.) rate/rhythm maintained on oral metoprolol succinate; chronically anticoagulated using standard dose apixaban   Personal history of radiation therapy    a.) for LEFT breast cancer; completed 03/2017   Tick fever    Type 2 diabetes mellitus treated with insulin (HCC)    a.) has CGM in place   Urinary incontinence     Assessment/Plan: 1 Day Post-Op Procedure(s) (LRB): COMPUTER ASSISTED TOTAL KNEE ARTHROPLASTY - RNFA (Right) Principal Problem:   History of total knee arthroplasty, right  Estimated body mass index is 38.23 kg/m as calculated from the following:   Height as of this  encounter: 5\' 2"  (1.575 m).   Weight as of this encounter: 94.8 kg. Advance diet Up with therapy  Patient will continue to work with physical therapy to pass postoperative PT protocols, ROM and strengthening  Discussed with the patient continuing to utilize Polar Care  Patient will use bone foam in 20-30 minute intervals  Patient will wear TED hose bilaterally to help prevent DVT and clot formation  Discussed the Aquacel bandage.  This bandage will stay in place 7 days postoperatively.  Can be replaced with  honeycomb bandages that will be sent home with the patient  Discussed sending the patient home with tramadol and oxycodone for as needed pain management.  Patient will also be sent home with Celebrex to help with swelling and inflammation.  Patient will continue on at home Eliquis for DVT prophylaxis  JP drain removed without difficulty, intact  Weight-Bearing as tolerated to right leg  Patient will follow-up with [facility] orthopedics in 2 weeks for staple removal and reevaluation  Rayburn Go, PA-C [facility] 05/29/2023, 9:26 AM

## 2023-05-29 DIAGNOSIS — M1711 Unilateral primary osteoarthritis, right knee: Secondary | ICD-10-CM | POA: Diagnosis not present

## 2023-05-29 LAB — GLUCOSE, CAPILLARY: Glucose-Capillary: 187 mg/dL — ABNORMAL HIGH (ref 70–99)

## 2023-05-29 MED ORDER — TRAMADOL HCL 50 MG PO TABS
50.0000 mg | ORAL_TABLET | ORAL | 0 refills | Status: AC | PRN
Start: 2023-05-29 — End: ?

## 2023-05-29 MED ORDER — OXYCODONE HCL 5 MG PO TABS: 5.0000 mg | ORAL_TABLET | ORAL | 0 refills | Status: DC | PRN

## 2023-05-29 NOTE — Evaluation (Signed)
 Occupational Therapy Evaluation Patient Details Name: Bonnie Holt MRN: [id number] DOB: 1945-11-16 Today's Date: 05/29/2023   History of Present Illness   Pt admitted to [facility] on 05/28/23 under observation for elective R TKA by Dr. Ernest Pine. Significant PMH includes: CAD, T2DM, CKD (stage IV), OSA, osteopenia, pAfib, hx L TKA, HLD, HTN, CHF, obesity.     Clinical Impressions OT evaluation completed.  Pt requesting dressing assist in prep for d/c home.  Overall min A for LB dressing and set up/supv for UB ADLs.  Pt reports spouse will be able to assist with all ADL needs.  Reviewed AE; pt has all necessary items in the home from previous surgeries.  Pt demos good safety with ADLs and functional mobility at this time.  No additional skilled OT.  Pt planning return home with spouse, HH PT, and transition to outpatient in about a week.  Pt in agreement with no additional skilled OT needs at this time.      If plan is discharge home, recommend the following:   A little help with walking and/or transfers;A little help with bathing/dressing/bathroom;Assistance with cooking/housework;Assist for transportation;Help with stairs or ramp for entrance     Functional Status Assessment   Patient has had a recent decline in their functional status and demonstrates the ability to make significant improvements in function in a reasonable and predictable amount of time.     Equipment Recommendations   None recommended by OT     Recommendations for Other Services         Precautions/Restrictions   Precautions Precautions: Knee Precaution Booklet Issued: Yes (comment) Precaution/Restrictions Comments: bone foam Restrictions Weight Bearing Restrictions Per Provider Order: Yes RLE Weight Bearing Per Provider Order: Weight bearing as tolerated     Mobility Bed Mobility               General bed mobility comments: pt seated in recliner upon entry Patient Response:  Cooperative  Transfers Overall transfer level: Needs assistance Equipment used: Rolling walker (2 wheels) Transfers: Sit to/from Stand Sit to Stand: Supervision                  Balance Overall balance assessment: Mild deficits observed, not formally tested                                         ADL either performed or assessed with clinical judgement   ADL Overall ADL's : Needs assistance/impaired     Grooming: Wash/dry hands;Standing;Supervision/safety           Upper Body Dressing : Set up;Sitting;Supervision/safety Upper Body Dressing Details (indicate cue type and reason): recommended donning shirt while seated to reduce fall risk Lower Body Dressing: Minimal assistance Lower Body Dressing Details (indicate cue type and reason): Min A for socks/shoes only; able to don underwear and pants with set up/supv Toilet Transfer: Supervision/safety;Ambulation;BSC/3in1;Rolling walker (2 wheels);Grab bars Toilet Transfer Details (indicate cue type and reason): 3in1 frame over toilet Toileting- Clothing Manipulation and Hygiene: Supervision/safety;Sit to/from stand       Functional mobility during ADLs: Contact guard assist;Rolling walker (2 wheels) General ADL Comments: ambulatory from recliner to bathroom for ADL completion with min guard and RW, no reports of pain at this time.     Vision Patient Visual Report: No change from baseline       Perception         Praxis  Pertinent Vitals/Pain Pain Assessment Pain Assessment: No/denies pain     Extremity/Trunk Assessment Upper Extremity Assessment Upper Extremity Assessment: Overall WFL for tasks assessed   Lower Extremity Assessment Lower Extremity Assessment: Overall WFL for tasks assessed;RLE deficits/detail;Defer to PT evaluation RLE Deficits / Details: s/p R TKA; defer to PT eval       Communication Communication Communication: No apparent difficulties   Cognition Arousal:  Alert Behavior During Therapy: WFL for tasks assessed/performed                                 Following commands: Intact       Cueing  General Comments   Cueing Techniques: Verbal cues;Tactile cues;Visual cues  PA present during session to remove ACE bandage/wrapping and hemavac; mild strikethrough noted to incisional covering.   Exercises Other Exercises Other Exercises: Educ provided on OT role.  Reinforced benefits of ankle pumps hourly to promote LE circulation.   Shoulder Instructions      Home Living Family/patient expects to be discharged to:: Private residence Living Arrangements: Spouse/significant other Available Help at Discharge: Family;Available 24 hours/day Type of Home: House Home Access: Stairs to enter [employer] of Steps: 1 Entrance Stairs-Rails: Left Home Layout: One level     Bathroom Shower/Tub: Producer, television/film/video: Handicapped height Bathroom Accessibility: Yes   Home Equipment: Agricultural consultant (2 wheels);Rollator (4 wheels);Cane - quad;Shower seat - built in;Grab bars - tub/shower;Hand held shower head   Additional Comments: pull down shower seat      Prior Functioning/Environment Prior Level of Function : Independent/Modified Independent;Driving             Mobility Comments: IND with limited household/community ambulation without AD; use of rollator on uneven surfaces ADLs Comments: IND with ADL's, IADL's, medication management. Able to drive, but spouse provides transportation.    OT Problem List: Decreased knowledge of precautions;Decreased cognition;Impaired balance (sitting and/or standing)   OT Treatment/Interventions:        OT Goals(Current goals can be found in the care plan section)   Acute Rehab OT Goals Patient Stated Goal: Home with spouse OT Goal Formulation: With patient Time For Goal Achievement: 06/12/23 Potential to Achieve Goals: Good ADL Goals Pt Will Perform Lower  Body Dressing: with min assist;sit to/from stand Pt Will Transfer to Toilet: with supervision;ambulating;bedside commode;grab bars Pt Will Perform Toileting - Clothing Manipulation and hygiene: with supervision;sitting/lateral leans   OT Frequency:       Co-evaluation              AM-PAC OT "6 Clicks" Daily Activity     Outcome Measure Help from another person eating meals?: None Help from another person taking care of personal grooming?: None Help from another person toileting, which includes using toliet, bedpan, or urinal?: A Little Help from another person bathing (including washing, rinsing, drying)?: A Little Help from another person to put on and taking off regular upper body clothing?: None Help from another person to put on and taking off regular lower body clothing?: A Little 6 Click Score: 21   End of Session Equipment Utilized During Treatment: Rolling walker (2 wheels)  Activity Tolerance: Patient tolerated treatment well Patient left: in chair;with family/visitor present;with call bell/phone within reach  OT Visit Diagnosis: Unsteadiness on feet (R26.81);Other abnormalities of gait and mobility (R26.89)                Time: 4098-1191 OT Time  Calculation (min): 26 min Charges:  OT General Charges $OT Visit: 1 Visit OT Evaluation $OT Eval Moderate Complexity: 1 Mod OT Treatments $Self Care/Home Management : 8-22 mins  Danelle Earthly, MS, OTR/L  [facility] 05/29/2023, 11:57 AM

## 2023-05-29 NOTE — Plan of Care (Signed)
   Problem: Education: Goal: Knowledge of General Education information will improve Description: Including pain rating scale, medication(s)/side effects and non-pharmacologic comfort measures Outcome: Progressing   Problem: Clinical Measurements: Goal: Will remain free from infection Outcome: Progressing   Problem: Nutrition: Goal: Adequate nutrition will be maintained Outcome: Progressing   Problem: Coping: Goal: Level of anxiety will decrease Outcome: Progressing   Problem: Pain Managment: Goal: General experience of comfort will improve and/or be controlled Outcome: Progressing

## 2023-05-29 NOTE — TOC Transition Note (Addendum)
 Transition of Care [facility]) - Discharge Note   Patient Details  Name: Bonnie Holt MRN: [id number] Date of Birth: Feb 20, 1946  [facility] CM/SW Contact:  Bing Quarry, RN Phone Number: 05/29/2023, 9:38 AM   Clinical Narrative:  3/15//25: Potential discharge today s/p total knee surgery. Set up with [facility], has been contacted for first meeting on Sunday 05/30/23 per patient, and has needed DME at home from prior knee surgery. Spouse will transport home today on discharge. Patient expresses no other concerns regarding discharge today.    Gabriel Cirri MSN RN CM  RN Case Manager [place]  Transitions of Care Direct Dial: [phone] (Weekends Only) [facility] Main Office Phone: [phone] [facility] Fax: [phone] [email or web address]      Final next level of care: Home w Home Health Services Barriers to Discharge: No Barriers Identified   Patient Goals and CMS Choice            Discharge Placement                       Discharge Plan and Services Additional resources added to the After Visit Summary for                  DME Arranged: N/A (Patient has needed DME at home from prior knee surgery.)         HH Arranged: PT, OT HH Agency: [facility] Date [facility] Agency Contacted:  (Set up prior to surgery by surgeon's office. Patient confirmed they are coming out to see her Sunday 05/30/23.)      Social Drivers of Health (SDOH) Interventions SDOH Screenings   Food Insecurity: No Food Insecurity (05/28/2023)  Recent Concern: Food Insecurity - Food Insecurity Present (03/16/2023)   Received from [facility]  Housing: Low Risk  (05/28/2023)  Transportation Needs: No Transportation Needs (05/28/2023)  Utilities: Not At Risk (05/28/2023)  Alcohol Screen: Low Risk  (10/16/2021)  Depression (PHQ2-9): Low Risk  (10/16/2021)  Financial Resource Strain: Low Risk  (03/16/2023)   Received from [facility]  Physical Activity:  Sufficiently Active (10/16/2021)  Social Connections: Patient Declined (05/28/2023)  Stress: No Stress Concern Present (10/16/2021)  Tobacco Use: Low Risk  (05/28/2023)     Readmission Risk Interventions     No data to display

## 2023-05-29 NOTE — Anesthesia Postprocedure Evaluation (Signed)
 Anesthesia Post Note  Patient: Bonnie Holt  Procedure(s) Performed: COMPUTER ASSISTED TOTAL KNEE ARTHROPLASTY - RNFA (Right: Knee)  Patient location during evaluation: Nursing Unit Anesthesia Type: Spinal Level of consciousness: oriented and awake and alert Pain management: pain level controlled Vital Signs Assessment: post-procedure vital signs reviewed and stable Respiratory status: spontaneous breathing and respiratory function stable Cardiovascular status: blood pressure returned to baseline and stable Postop Assessment: no headache, no backache, no apparent nausea or vomiting and patient able to bend at knees Anesthetic complications: no   No notable events documented.   Last Vitals:  Vitals:   05/29/23 0347 05/29/23 0728  BP: (!) 104/42 (!) 121/43  Pulse: 63 (!) 55  Resp: 16 16  Temp: (!) 36.3 C 36.6 C  SpO2: 98% 97%    Last Pain:  Vitals:   05/29/23 0821  TempSrc:   PainSc: 0-No pain                 Foye Deer

## 2023-05-29 NOTE — Evaluation (Signed)
 Physical Therapy Evaluation Patient Details Name: Bonnie Holt MRN: [id number] DOB: 03-03-1946 Today's Date: 05/29/2023  History of Present Illness  Pt admitted to [facility] on 05/28/23 under observation for elective R TKA by Dr. Ernest Pine. Significant PMH includes: CAD, T2DM, CKD (stage IV), OSA, osteopenia, pAfib, hx L TKA, HLD, HTN, CHF, obesity.  Clinical Impression  Pt is a 78 year old F admitted to hospital on 3/14 for elective R TKA. At baseline, pt is IND with ADL's, IADL's, limited ambulation without AD, and medication management; spouse provides transportation.   Pt presents with decreased standing balance, gait deficits, decreased activity tolerance secondary to acuity of sx, resulting in impaired functional mobility from baseline. Due to deficits, pt required SBA for transfers with RW and CGA to ambulate a total of 63ft with RW. Pt able to complete stair training at CGA level and demonstrating good safety awareness. Pt verbalizes and shows good understanding of TKA packet education and HEP.  Deficits limit the pt's ability to safely and independently perform ADL's, transfer, and ambulate. Pt will benefit from acute skilled PT services to address deficits for return to baseline function. Pt will benefit from post acute therapy services as recommended by physician.         If plan is discharge home, recommend the following: A little help with bathing/dressing/bathroom;Assistance with cooking/housework;Assist for transportation;Help with stairs or ramp for entrance     Equipment Recommendations None recommended by PT     Functional Status Assessment Patient has had a recent decline in their functional status and demonstrates the ability to make significant improvements in function in a reasonable and predictable amount of time.     Precautions / Restrictions Precautions Precautions: Knee Precaution Booklet Issued: Yes (comment) Precaution/Restrictions Comments: bone  foam Restrictions Weight Bearing Restrictions Per Provider Order: Yes RLE Weight Bearing Per Provider Order: Weight bearing as tolerated      Mobility  Bed Mobility               General bed mobility comments: pt seated in recliner upon entry    Transfers Overall transfer level: Needs assistance Equipment used: Rolling walker (2 wheels) Transfers: Sit to/from Stand             General transfer comment: SBA for safety to stand from recliner x3 with RW and x1 from [facility] with RW. Demonstrates good safety awareness with hand placement and RLE positioning without cueing. Demonstrates "good" eccentric lowering with proper hand placement.    Ambulation/Gait   Gait Distance (Feet): 90 Feet (25ft x1; 82ft x1) Assistive device: Rolling walker (2 wheels)         General Gait Details: SBA for safety to ambulate multiple bouts with RW. Demonstrates early reciprocal gait with decreased step length and foot clearance on RLE.  Stairs Stairs: Yes Stairs assistance: Contact guard assist Stair Management: Two rails Number of Stairs: 4 General stair comments: CGA for safety to navigate 4 steps with step to gait pattern and BUE support; verbal cues for safety, sequencing, and hand placement.      Balance Overall balance assessment: Mild deficits observed, not formally tested                                           Pertinent Vitals/Pain Pain Assessment Pain Assessment: No/denies pain    Home Living Family/patient expects to be discharged to:: Private residence Living  Arrangements: Spouse/significant other Available Help at Discharge: Family;Available 24 hours/day (spouse available for 24/7 care) Type of Home: House Home Access: Stairs to enter Entrance Stairs-Rails: Left Entrance Stairs-Number of Steps: 1   Home Layout: One level Home Equipment: Agricultural consultant (2 wheels);Rollator (4 wheels);Cane - quad;Shower seat - built in;Grab bars -  tub/shower;Hand held shower head Lexicographer)      Prior Function Prior Level of Function : Independent/Modified Independent;Driving             Mobility Comments: IND with limited household/community ambulation without AD; use of rollator on uneven surfaces ADLs Comments: IND with ADL's, IADL's, medication management. Able to drive, but spouse provides transportation.     Extremity/Trunk Assessment   Upper Extremity Assessment Upper Extremity Assessment: Defer to OT evaluation    Lower Extremity Assessment Lower Extremity Assessment: Overall WFL for tasks assessed;RLE deficits/detail RLE Deficits / Details: secondary to acuity of sx; observed to be at least 3+/5 as pt is able to WB through LE for transfers and gait without buckling. Sensation intact. Denies N/T.       Communication   Communication Communication: No apparent difficulties    Cognition Arousal: Alert Behavior During Therapy: WFL for tasks assessed/performed   PT - Cognitive impairments: No apparent impairments                         Following commands: Intact       Cueing Cueing Techniques: Verbal cues, Tactile cues, Visual cues     General Comments General comments (skin integrity, edema, etc.): PA present during session to remove ACE bandage/wrapping and hemavac; mild strikethrough noted to incisional covering.    Exercises Total Joint Exercises Ankle Circles/Pumps: AROM, Both, 10 reps Quad Sets: AROM, Right, 5 reps Short Arc Quad: AROM, Right, 5 reps Heel Slides: AROM, Right, 5 reps Hip ABduction/ADduction: AROM, Right, 5 reps Straight Leg Raises: AROM, Right, 5 reps Other Exercises Other Exercises: Pt educated re: PT role/POC, DC recommendations, safety with functional mobility, stair navigation, TKA HEP, removing trip hazards at home, DVT prophylaxis and s/s. She verbalized understanding.   Assessment/Plan    PT Assessment Patient needs continued PT services  PT Problem List  Decreased range of motion;Decreased activity tolerance;Decreased balance;Decreased mobility       PT Treatment Interventions DME instruction;Gait training;Stair training;Functional mobility training;Therapeutic activities;Therapeutic exercise;Balance training;Patient/family education    PT Goals (Current goals can be found in the Care Plan section)  Acute Rehab PT Goals Patient Stated Goal: "go home" PT Goal Formulation: With patient Time For Goal Achievement: 06/12/23 Potential to Achieve Goals: Good    Frequency BID        AM-PAC PT "6 Clicks" Mobility  Outcome Measure Help needed turning from your back to your side while in a flat bed without using bedrails?: A Little Help needed moving from lying on your back to sitting on the side of a flat bed without using bedrails?: A Little Help needed moving to and from a bed to a chair (including a wheelchair)?: A Little Help needed standing up from a chair using your arms (e.g., wheelchair or bedside chair)?: A Little Help needed to walk in hospital room?: A Little Help needed climbing 3-5 steps with a railing? : A Little 6 Click Score: 18    End of Session Equipment Utilized During Treatment: Gait belt Activity Tolerance: Patient tolerated treatment well Patient left: in chair;with call bell/phone within reach;with chair alarm set Nurse Communication: Mobility status  PT Visit Diagnosis: Unsteadiness on feet (R26.81);Muscle weakness (generalized) (M62.81)    Time: 0827-0919 PT Time Calculation (min) (ACUTE ONLY): 52 min   Charges:   PT Evaluation $PT Eval Low Complexity: 1 Low PT Treatments $Therapeutic Exercise: 8-22 mins $Therapeutic Activity: 23-37 mins PT General Charges $$ ACUTE PT VISIT: 1 Visit        Vira Blanco, PT, DPT 10:05 AM,05/29/23 Physical Therapist - [facility] [facility]

## 2023-05-29 NOTE — Progress Notes (Signed)
 Patient ambulated with standby assist and walker to doorway without difficulty. No complaints of pain at this time. Bone foam in place.

## 2023-05-30 DIAGNOSIS — M8580 Other specified disorders of bone density and structure, unspecified site: Secondary | ICD-10-CM | POA: Diagnosis not present

## 2023-05-30 DIAGNOSIS — I480 Paroxysmal atrial fibrillation: Secondary | ICD-10-CM | POA: Diagnosis not present

## 2023-05-30 DIAGNOSIS — E785 Hyperlipidemia, unspecified: Secondary | ICD-10-CM | POA: Diagnosis not present

## 2023-05-30 DIAGNOSIS — Z471 Aftercare following joint replacement surgery: Secondary | ICD-10-CM | POA: Diagnosis not present

## 2023-05-30 DIAGNOSIS — Z6841 Body Mass Index (BMI) 40.0 and over, adult: Secondary | ICD-10-CM | POA: Diagnosis not present

## 2023-05-30 DIAGNOSIS — I130 Hypertensive heart and chronic kidney disease with heart failure and stage 1 through stage 4 chronic kidney disease, or unspecified chronic kidney disease: Secondary | ICD-10-CM | POA: Diagnosis not present

## 2023-05-30 DIAGNOSIS — Z96653 Presence of artificial knee joint, bilateral: Secondary | ICD-10-CM | POA: Diagnosis not present

## 2023-05-30 DIAGNOSIS — N184 Chronic kidney disease, stage 4 (severe): Secondary | ICD-10-CM | POA: Diagnosis not present

## 2023-05-30 DIAGNOSIS — Z85828 Personal history of other malignant neoplasm of skin: Secondary | ICD-10-CM | POA: Diagnosis not present

## 2023-05-30 DIAGNOSIS — Z955 Presence of coronary angioplasty implant and graft: Secondary | ICD-10-CM | POA: Diagnosis not present

## 2023-05-30 DIAGNOSIS — I5032 Chronic diastolic (congestive) heart failure: Secondary | ICD-10-CM | POA: Diagnosis not present

## 2023-05-30 DIAGNOSIS — E1122 Type 2 diabetes mellitus with diabetic chronic kidney disease: Secondary | ICD-10-CM | POA: Diagnosis not present

## 2023-05-30 DIAGNOSIS — Z7901 Long term (current) use of anticoagulants: Secondary | ICD-10-CM | POA: Diagnosis not present

## 2023-05-30 DIAGNOSIS — I2510 Atherosclerotic heart disease of native coronary artery without angina pectoris: Secondary | ICD-10-CM | POA: Diagnosis not present

## 2023-05-30 DIAGNOSIS — Z7984 Long term (current) use of oral hypoglycemic drugs: Secondary | ICD-10-CM | POA: Diagnosis not present

## 2023-05-30 DIAGNOSIS — I447 Left bundle-branch block, unspecified: Secondary | ICD-10-CM | POA: Diagnosis not present

## 2023-05-30 DIAGNOSIS — G4733 Obstructive sleep apnea (adult) (pediatric): Secondary | ICD-10-CM | POA: Diagnosis not present

## 2023-05-30 DIAGNOSIS — Z853 Personal history of malignant neoplasm of breast: Secondary | ICD-10-CM | POA: Diagnosis not present

## 2023-05-31 ENCOUNTER — Encounter: Payer: Self-pay | Admitting: Orthopedic Surgery

## 2023-06-11 DIAGNOSIS — R531 Weakness: Secondary | ICD-10-CM | POA: Diagnosis not present

## 2023-06-11 DIAGNOSIS — M25561 Pain in right knee: Secondary | ICD-10-CM | POA: Diagnosis not present

## 2023-06-11 DIAGNOSIS — Z96651 Presence of right artificial knee joint: Secondary | ICD-10-CM | POA: Diagnosis not present

## 2023-06-11 DIAGNOSIS — M25661 Stiffness of right knee, not elsewhere classified: Secondary | ICD-10-CM | POA: Diagnosis not present

## 2023-06-11 DIAGNOSIS — G8929 Other chronic pain: Secondary | ICD-10-CM | POA: Diagnosis not present

## 2023-06-14 DIAGNOSIS — G4733 Obstructive sleep apnea (adult) (pediatric): Secondary | ICD-10-CM | POA: Diagnosis not present

## 2023-06-14 DIAGNOSIS — M25661 Stiffness of right knee, not elsewhere classified: Secondary | ICD-10-CM | POA: Diagnosis not present

## 2023-06-14 DIAGNOSIS — G8929 Other chronic pain: Secondary | ICD-10-CM | POA: Diagnosis not present

## 2023-06-14 DIAGNOSIS — M25561 Pain in right knee: Secondary | ICD-10-CM | POA: Diagnosis not present

## 2023-06-14 DIAGNOSIS — Z96651 Presence of right artificial knee joint: Secondary | ICD-10-CM | POA: Diagnosis not present

## 2023-06-14 DIAGNOSIS — I10 Essential (primary) hypertension: Secondary | ICD-10-CM | POA: Diagnosis not present

## 2023-06-14 DIAGNOSIS — R531 Weakness: Secondary | ICD-10-CM | POA: Diagnosis not present

## 2023-06-16 DIAGNOSIS — Z96651 Presence of right artificial knee joint: Secondary | ICD-10-CM | POA: Diagnosis not present

## 2023-06-18 DIAGNOSIS — Z96651 Presence of right artificial knee joint: Secondary | ICD-10-CM | POA: Diagnosis not present

## 2023-06-18 DIAGNOSIS — M25561 Pain in right knee: Secondary | ICD-10-CM | POA: Diagnosis not present

## 2023-06-22 DIAGNOSIS — Z96651 Presence of right artificial knee joint: Secondary | ICD-10-CM | POA: Diagnosis not present

## 2023-06-22 DIAGNOSIS — M25661 Stiffness of right knee, not elsewhere classified: Secondary | ICD-10-CM | POA: Diagnosis not present

## 2023-06-22 DIAGNOSIS — M25561 Pain in right knee: Secondary | ICD-10-CM | POA: Diagnosis not present

## 2023-06-22 DIAGNOSIS — G8929 Other chronic pain: Secondary | ICD-10-CM | POA: Diagnosis not present

## 2023-06-22 DIAGNOSIS — R531 Weakness: Secondary | ICD-10-CM | POA: Diagnosis not present

## 2023-06-24 DIAGNOSIS — Z96651 Presence of right artificial knee joint: Secondary | ICD-10-CM | POA: Diagnosis not present

## 2023-06-24 DIAGNOSIS — M25661 Stiffness of right knee, not elsewhere classified: Secondary | ICD-10-CM | POA: Diagnosis not present

## 2023-06-29 DIAGNOSIS — R531 Weakness: Secondary | ICD-10-CM | POA: Diagnosis not present

## 2023-06-29 DIAGNOSIS — M25661 Stiffness of right knee, not elsewhere classified: Secondary | ICD-10-CM | POA: Diagnosis not present

## 2023-06-29 DIAGNOSIS — M25561 Pain in right knee: Secondary | ICD-10-CM | POA: Diagnosis not present

## 2023-06-29 DIAGNOSIS — Z96651 Presence of right artificial knee joint: Secondary | ICD-10-CM | POA: Diagnosis not present

## 2023-06-29 DIAGNOSIS — G8929 Other chronic pain: Secondary | ICD-10-CM | POA: Diagnosis not present

## 2023-07-01 DIAGNOSIS — M25661 Stiffness of right knee, not elsewhere classified: Secondary | ICD-10-CM | POA: Diagnosis not present

## 2023-07-01 DIAGNOSIS — Z23 Encounter for immunization: Secondary | ICD-10-CM | POA: Diagnosis not present

## 2023-07-01 DIAGNOSIS — S91331A Puncture wound without foreign body, right foot, initial encounter: Secondary | ICD-10-CM | POA: Diagnosis not present

## 2023-07-01 DIAGNOSIS — R531 Weakness: Secondary | ICD-10-CM | POA: Diagnosis not present

## 2023-07-01 DIAGNOSIS — G8929 Other chronic pain: Secondary | ICD-10-CM | POA: Diagnosis not present

## 2023-07-01 DIAGNOSIS — M25561 Pain in right knee: Secondary | ICD-10-CM | POA: Diagnosis not present

## 2023-07-01 DIAGNOSIS — Z96651 Presence of right artificial knee joint: Secondary | ICD-10-CM | POA: Diagnosis not present

## 2023-07-06 DIAGNOSIS — Z96651 Presence of right artificial knee joint: Secondary | ICD-10-CM | POA: Diagnosis not present

## 2023-07-06 DIAGNOSIS — Z96653 Presence of artificial knee joint, bilateral: Secondary | ICD-10-CM | POA: Diagnosis not present

## 2023-07-06 DIAGNOSIS — M25661 Stiffness of right knee, not elsewhere classified: Secondary | ICD-10-CM | POA: Diagnosis not present

## 2023-07-08 DIAGNOSIS — G8929 Other chronic pain: Secondary | ICD-10-CM | POA: Diagnosis not present

## 2023-07-08 DIAGNOSIS — M25561 Pain in right knee: Secondary | ICD-10-CM | POA: Diagnosis not present

## 2023-07-08 DIAGNOSIS — Z96651 Presence of right artificial knee joint: Secondary | ICD-10-CM | POA: Diagnosis not present

## 2023-07-08 DIAGNOSIS — R531 Weakness: Secondary | ICD-10-CM | POA: Diagnosis not present

## 2023-07-08 DIAGNOSIS — M25661 Stiffness of right knee, not elsewhere classified: Secondary | ICD-10-CM | POA: Diagnosis not present

## 2023-07-13 DIAGNOSIS — Z96651 Presence of right artificial knee joint: Secondary | ICD-10-CM | POA: Diagnosis not present

## 2023-07-13 DIAGNOSIS — M25661 Stiffness of right knee, not elsewhere classified: Secondary | ICD-10-CM | POA: Diagnosis not present

## 2023-07-13 DIAGNOSIS — R531 Weakness: Secondary | ICD-10-CM | POA: Diagnosis not present

## 2023-07-13 DIAGNOSIS — G8929 Other chronic pain: Secondary | ICD-10-CM | POA: Diagnosis not present

## 2023-07-13 DIAGNOSIS — M25561 Pain in right knee: Secondary | ICD-10-CM | POA: Diagnosis not present

## 2023-07-14 DIAGNOSIS — G4733 Obstructive sleep apnea (adult) (pediatric): Secondary | ICD-10-CM | POA: Diagnosis not present

## 2023-07-14 DIAGNOSIS — I10 Essential (primary) hypertension: Secondary | ICD-10-CM | POA: Diagnosis not present

## 2023-07-15 DIAGNOSIS — M25561 Pain in right knee: Secondary | ICD-10-CM | POA: Diagnosis not present

## 2023-07-15 DIAGNOSIS — I10 Essential (primary) hypertension: Secondary | ICD-10-CM | POA: Diagnosis not present

## 2023-07-15 DIAGNOSIS — Z96651 Presence of right artificial knee joint: Secondary | ICD-10-CM | POA: Diagnosis not present

## 2023-07-15 DIAGNOSIS — N184 Chronic kidney disease, stage 4 (severe): Secondary | ICD-10-CM | POA: Diagnosis not present

## 2023-07-15 DIAGNOSIS — I25119 Atherosclerotic heart disease of native coronary artery with unspecified angina pectoris: Secondary | ICD-10-CM | POA: Diagnosis not present

## 2023-07-15 DIAGNOSIS — I480 Paroxysmal atrial fibrillation: Secondary | ICD-10-CM | POA: Diagnosis not present

## 2023-07-15 DIAGNOSIS — E1122 Type 2 diabetes mellitus with diabetic chronic kidney disease: Secondary | ICD-10-CM | POA: Diagnosis not present

## 2023-07-16 DIAGNOSIS — Z96651 Presence of right artificial knee joint: Secondary | ICD-10-CM | POA: Diagnosis not present

## 2023-07-16 DIAGNOSIS — M24661 Ankylosis, right knee: Secondary | ICD-10-CM | POA: Diagnosis not present

## 2023-07-18 NOTE — Discharge Instructions (Signed)
 Instructions after Total Knee Replacement   Bonnie Holt P. Angie Fava., M.D.    Dept. of Orthopaedics & Sports Medicine [facility] [address] [place], [place]  [address]  Phone: [phone]   Fax: [phone]       [email or web address]       DIET: Drink plenty of non-alcoholic fluids. Resume your normal diet. Include foods high in fiber.  ACTIVITY:  You may use crutches or a walker with weight-bearing as tolerated, unless instructed otherwise. You may be weaned off of the walker or crutches by your Physical Therapist.  Do NOT place pillows under the knee. Anything placed under the knee could limit your ability to straighten the knee.   Use the Bone Foam 3 times a day for 30 minutes each session to help straighten the knee. Continue doing gentle exercises. Exercising will reduce the pain and swelling, increase motion, and prevent muscle weakness.   Please continue to use the TED compression stockings for 6 weeks. You may remove the stockings at night, but should reapply them in the morning. Do not drive or operate any equipment until instructed.  WOUND CARE:  The initial dressing (Aquacel) can remain in place for 7 days (see separate instructions). Continue to use the PolarCare or ice packs periodically to reduce pain and swelling. You may bathe or shower after the staples are removed at the first office visit following surgery.  MEDICATIONS: You may resume your regular medications. Please take the pain medication as prescribed on the medication. Do not take pain medication on an empty stomach. Unless instructed otherwise, you should take an enteric-coated aspirin 81 mg. TWICE a day. (This along with elevation will help reduce the possibility of blood clots/phlebitis in your operated leg.) Use a stool softener (such as Senokot-S or Colace) daily and a laxative (such as Miralax or Dulcolax) as needed to prevent constipation.  Do not drive or drink alcoholic beverages when  taking pain medications.  CALL THE OFFICE FOR: Temperature above 101 degrees Excessive bleeding or drainage on the dressing. Excessive swelling, coldness, or paleness of the toes. Persistent nausea and vomiting.  FOLLOW-UP:  You should have an appointment to return to the office in 10-14 days after surgery. Arrangements have been made for continuation of Physical Therapy (either home therapy or outpatient therapy).     [facility] Department Directory         [email or web address]       [email or web address]          Cardiology  Appointments: [place] - [phone]  Endocrinology  Appointments: [place] [phone] [place] - [phone]  Gastroenterology  Appointments: [employer] [phone] [place] - [phone]        General Surgery   Appointments: [facility]  Internal Medicine/Family Medicine  Appointments: [facility] [place] - [phone] [place] - [phone]  Metabolic and Weigh Loss Surgery  Appointments: [facility]        Neurology  Appointments: [place] [phone] [place] - [phone]  Neurosurgery  Appointments: [place]  Obstetrics & Gynecology  Appointments: [place] [phone] [place] - [phone]        Pediatrics  Appointments: [place] [phone] [place] - [phone]  Physiatry  Appointments: [place] [phone]  Physical Therapy  Appointments: [place] - [phone]        Podiatry  Appointments: [place] [phone] [place] - [phone]  Pulmonology  Appointments: [place] [facility]  Rheumatology  Appointments: [place] [phone]  [place] Location: [facility]  [address] [place], [place]  [address]  [place] Location: [facility]. [address] [place], [place]  [address]  [place]  Location: [facility] [address] [place], [place]  [address]

## 2023-07-18 NOTE — H&P (Signed)
 ORTHOPAEDIC HISTORY & PHYSICAL Angila Wombles, Kalvin Orf., MD - 07/16/2023 11:30 AM EDT Formatting of this note is different from the original. Images from the original note were not included. Chief Complaint: Chief Complaint Patient presents with Post Operative Visit 6 Weeks out; PO RT TKA/ STIFFNESS- 05/28/23- [facility]   Reason for Visit: The patient is a 78 y.o. female who returns today with her husband for reevaluation of her right knee. She is 7 weeks status post right total knee arthroplasty She denies any significant pain, swelling, locking, or giving way of the knees. She states that the knee "feels tight". She is using a cane for ambulation. She has continued with physical therapy and has been conscientious about her home exercise program but has not appreciated any improvement with her motion.  Medications: Current Outpatient Medications Medication Sig Dispense Refill acetaminophen  (TYLENOL ) 650 MG ER tablet Take 1,300 mg by mouth every 8 (eight) hours as needed for Pain atorvastatin  (LIPITOR ) 80 MG tablet TAKE ONE TABLET EVERY DAY 90 tablet 3 biotin 5,000 mcg TbDL Take 1 tablet by mouth once daily cholecalciferol , vitamin D3, (VITAMIN D3) 125 mcg (5,000 unit) tablet Take by mouth once daily clobetasoL (TEMOVATE) 0.05 % ointment Apply topically to affected area nightly x 4 weeks, then every other night x 4 weeks, then twice weekly for maintenance 60 g 1 coenzyme Q10 75 mg Cap Take 1 capsule by mouth once daily coenzyme Q10-vitamin E 100-5 mg-unit Cap Take 1 capsule by mouth once daily ELIQUIS  5 mg tablet TAKE 1 TABLET BY MOUTH EVERY 12 HOURS 180 tablet 3 flash glucose scanning (FREESTYLE LIBRE 2 READER) reader Use 1 Device as directed 1 each 1 flash glucose sensor (FREESTYLE LIBRE 2 SENSOR) Kit USE AS DIRECTED FOR GLUCOSE MONITORING 3 each 5 glipiZIDE  (GLUCOTROL ) 2.5 MG XL tablet TAKE 1 TABLET BY MOUTH DAILY 90 tablet 3 losartan  (COZAAR ) 25 MG tablet take one tablet every day 90  tablet 3 metoprolol  succinate (TOPROL -XL) 50 MG XL tablet take one tablet every day 90 tablet 3 multivitamin-lutein (CENTRUM SILVER ) tablet Take 1 tablet by mouth semaglutide (RYBELSUS) 14 mg tablet Take 1 tablet (14 mg total) by mouth once daily Do not cut, crush, or chew 120 tablet 0 sennosides 25 mg Tab Take 50 mg by mouth once daily as needed ((constipation).) TORsemide  (DEMADEX ) 20 MG tablet Take 20 mg by mouth once daily as needed traMADoL  (ULTRAM ) 50 mg tablet Take 1-2 tablets (50-100 mg total) by mouth every 6 (six) hours as needed for Pain 30 tablet 0  No current facility-administered medications for this visit.  Allergies: Allergies Allergen Reactions Levofloxacin Swelling Swelling of joints Warfarin Swelling Severe bruising, weakness Ace Inhibitors Cough  Past Medical History: Past Medical History: Diagnosis Date Abnormal screening cardiac CT Arthritis of knee ASCUS with positive high risk HPV cervical Cervicalgia Chronic diastolic CHF (congestive heart failure) (CMS/HHS-HCC) 10/26/2021 Chronic ischemic heart disease Chronic osteoarthritis Cough Dermatomycosis Diabetes mellitus type 2, uncomplicated (CMS/HHS-HCC) Dyspnea and respiratory abnormalities Hyperlipidemia Hypertension Malaise and fatigue Mild dysplasia of cervix Morbid obesity with body mass index of 40.0-44.9 in adult (CMS/HHS-HCC) Obstructive sleep apnea Osteopenia Primary cancer of upper outer quadrant of left female breast (CMS/HHS-HCC) 12/06/2016 Type 2 diabetes mellitus with stage 4 chronic kidney disease (CMS/HHS-HCC) Urinary incontinence, mixed Vulvitis  Past Surgical History: Past Surgical History: Procedure Laterality Date CORONARY ANGIOPLASTY WITH STENT PLACEMENT 08/2010 CERVICAL BIOPSY W/ LOOP ELECTRODE EXCISION 12/24/2010 COLPOSCOPY 07/31/2014 Mild Dysplasia Cryosurgery of Cervix 09/03/2014 MASTECTOMY, PARTIAL Left 12/16/2016 Dr Elmo Haff Charmel CooterIrving Mantle Left  total knee arthroplasty  using computer-assisted navigation 09/12/2021 Dr Aubry Blase Right total knee arthroplasty using computer-assisted navigation 05/28/2023 Dr Aubry Blase CATARACT EXTRACTION  Social History: Social History  Socioeconomic History Marital status: Married Spouse name: Galvin Jules Number of children: 0 Years of education: 14 Occupational History 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 Tobacco Use Smoking status: Never Passive exposure: Never Smokeless tobacco: Never Tobacco comments: never used Vaping Use Vaping status: Never Used Substance and Sexual Activity Alcohol use: Yes Comment: 2x year Drug use: No Sexual activity: Yes Partners: Male Birth control/protection: None  Social Drivers of Catering manager Strain: Low Risk (03/16/2023) Overall Financial Resource Strain (CARDIA) Difficulty of Paying Living Expenses: Not very hard Food Insecurity: No Food Insecurity (05/28/2023) Received from [facility] Hunger Vital Sign Worried About Running Out of Food in the Last Year: Never true Ran Out of Food in the Last Year: Never true Recent Concern: Food Insecurity - Food Insecurity Present (03/16/2023) Hunger Vital Sign Worried About Running Out of Food in the Last Year: Sometimes true Ran Out of Food in the Last Year: Sometimes true Transportation Needs: No Transportation Needs (05/28/2023) Received from [facility] - Transportation Lack of Transportation (Medical): No Lack of Transportation (Non-Medical): No Physical Activity: Sufficiently Active (10/16/2021) Received from [facility],  Exercise Vital Sign Days of Exercise per Week: 3 days Minutes of Exercise per Session: 50 min Stress: No Stress Concern Present (10/16/2021) Received from [facility], [facility] Occupational Health - Occupational Stress Questionnaire Feeling of Stress : Not at all Social Connections: Patient Declined (05/28/2023) Received from [facility] Social Connection and Isolation Panel [NHANES] Frequency of Communication with Friends and Family: Patient declined Frequency of Social Gatherings with Friends and Family: Patient declined Attends Religious Services: Patient declined Database administrator or Organizations: Patient declined Attends Banker Meetings: Patient declined Marital Status: Patient declined Housing Stability: Low Risk (06/11/2023) Housing Stability Vital Sign Unable to Pay for Housing in the Last Year: No Number of Times Moved in the Last Year: 0 Homeless in the Last Year: No  Family History: Family History Problem Relation Name Age of Onset Lung cancer Father Brain cancer Father Dementia Maternal Grandmother High blood pressure (Hypertension) Brother Ar[place] Beards Obesity Brother [place] Beards  Review of Systems: A comprehensive 14 point ROS was performed, reviewed, and the pertinent orthopaedic findings are documented in the HPI.  Exam BP 124/68  Ht 157.5 cm (5\' 2" )  Wt 93 kg (205 lb)  BMI 37.49 kg/m  General: Well-developed, well-nourished female seen in no acute distress. Even heel to toe gait without significant varus or valgus thrust to the right knee.  HEENT: Atraumatic, normocephalic. Pupils are equal and reactive to light. Extraocular motion is intact. Sclera are clear. Oropharynx is clear with moist mucosa.  Neck: Supple, nontender, and with good ROM. No thyromegaly, adenopathy, JVD, or carotid bruits.  Lungs: Clear to auscultation bilaterally.  Cardiovascular: Regular rate and rhythm. Normal S1, S2. No murmur . No appreciable gallops or rubs. Peripheral pulses are palpable. No lower extremity edema. Homan`s test is negative.  Abdomen: Soft, nontender, nondistended. Bowel sounds are present.  Right Knee: Soft tissue swelling: minimal Effusion: severe Erythema: none Crepitance: none Tenderness: No focal tenderness Alignment: normal Mediolateral laxity:  stable Patellar tracking: Good tracking without evidence of subluxation or tilt. Restricted patellar mobility. Atrophy: No significant atrophy. Quadriceps tone was fair to good. Range of motion: 0/0/95 degrees  Vascular: Peripheral pulses are palpable. Good  capillary refill. No gross pretibial or ankle edema. Homans test is negative.  Neurologic: Awake, alert, and oriented. Sensory function is intact to pinprick and light touch. Motor strength is judged to be 5/5. Motor coordination is grossly within normal limits. No apparent clonus. No tremor.  Radiographs: I ordered and interpreted standing AP, lateral, and sunrise views of the right knee that were obtained in the office today. Good position of the total knee implants. Good alignment is noted on the AP view. Good cement mantle is appreciated without evidence of loosening. No evidence of polyethylene wear or osteolysis. No evidence of fracture or dislocation.  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 Notes from physical therapy were reviewed. We discussed the risks and benefits of manipulation under anesthesia. The importance of resuming physical therapy immediately after the manipulation was emphasized. The patient expressed her understanding of the risks and benefits of manipulation of the right knee under anesthesia and wishes to proceed with the procedure. The importance of a conscientious home exercise program was reviewed with the patient.  I spent a total of 30 minutes in both face-to-face and non-face-to-face activities, excluding procedures performed, for this visit on the date of this encounter.  SPECIAL EQUIPMENT: None SPECIAL STUDIES: None ACTIVITIES: As tolerated. WORK STATUS: Not applicable. THERAPY: Physical therapy will be restarted immediately after the manipulation. Home exercise program as  reviewed with the patient. MEDICATIONS: Requested Prescriptions  No prescriptions requested or ordered in this encounter  FOLLOW-UP: Return for postoperative follow-up.  Valecia Beske P. Georgiana Spillane, [name], M.D.  This note was generated in part with voice recognition software and I apologize for any typographical errors that were not detected and corrected.

## 2023-07-21 ENCOUNTER — Encounter
Admission: RE | Admit: 2023-07-21 | Discharge: 2023-07-21 | Disposition: A | Discharge status: Home / Self Care | Source: Ambulatory Visit | Attending: Orthopedic Surgery | Admitting: Orthopedic Surgery

## 2023-07-21 VITALS — Ht 62.0 in | Wt 205.0 lb

## 2023-07-21 DIAGNOSIS — Z01818 Encounter for other preprocedural examination: Secondary | ICD-10-CM

## 2023-07-21 HISTORY — DX: Fibrosis due to internal orthopedic prosthetic devices, implants and grafts, initial encounter: T84.82XA

## 2023-07-21 NOTE — Patient Instructions (Addendum)
 Your procedure is scheduled on:07-26-23 Monday Report to the Registration Desk on the 1st floor of the [facility].Then proceed to the 2nd floor Surgery Desk To find out your arrival time, please call [phone] between 1PM - 3PM on:07-23-23 Friday If your arrival time is 6:00 am, do not arrive before that time as the [facility] entrance doors do not open until 6:00 am.  REMEMBER: Instructions that are not followed completely may result in serious medical risk, up to and including death; or upon the discretion of your surgeon and anesthesiologist your surgery may need to be rescheduled.  Do not eat food after midnight the night before surgery.  No gum chewing or hard candies.  You may however, drink Water  up to 2 hours before you are scheduled to arrive for your surgery. Do not drink anything within 2 hours of your scheduled arrival time.  In addition, your doctor has ordered for you to drink the provided:  Gatorade G2 Drinking this carbohydrate drink up to two hours before surgery helps to reduce insulin  resistance and improve patient outcomes. Please complete drinking 2 hours before scheduled arrival time.  One week prior to surgery:Stop NOW (07-21-23) Stop Anti-inflammatories (NSAIDS) such as Advil, Aleve, Ibuprofen, Motrin, Naproxen, Naprosyn and Aspirin  based products such as Excedrin, Goody's Powder, BC Powder. Stop ANY OVER THE COUNTER supplements until after surgery (Biotin, Calcium +D, Vitamin D3, Cinnamon, Co Q-10, Flaxseed Oil, Multivitamin)  You may however, continue to take Tylenol /Tramadol  if needed for pain up until the day of surgery.  Stop apixaban  (ELIQUIS ) 3 days prior to surgery-Last dose will be on 07-22-23 Thursday  Stop Semaglutide (Rybelsus) 1 day prior to surgery-Last dose will be on 07-24-23 Saturday  Continue taking all of your other prescription medications up until the day of surgery.  ON THE DAY OF SURGERY ONLY TAKE THESE MEDICATIONS WITH SIPS OF  WATER : -metoprolol  succinate (TOPROL -XL)   No Alcohol for 24 hours before or after surgery.  No Smoking including e-cigarettes for 24 hours before surgery.  No chewable tobacco products for at least 6 hours before surgery.  No nicotine  patches on the day of surgery.  Do not use any "recreational" drugs for at least a week (preferably 2 weeks) before your surgery.  Please be advised that the combination of cocaine and anesthesia may have negative outcomes, up to and including death. If you test positive for cocaine, your surgery will be cancelled.  On the morning of surgery brush your teeth with toothpaste and water , you may rinse your mouth with mouthwash if you wish. Do not swallow any toothpaste or mouthwash.  Do not wear jewelry, make-up, hairpins, clips or nail polish.  For welded (permanent) jewelry: bracelets, anklets, waist bands, etc.  Please have this removed prior to surgery.  If it is not removed, there is a chance that hospital personnel will need to cut it off on the day of surgery.  Do not wear lotions, powders, or perfumes.   Do not shave body hair from the neck down 48 hours before surgery.  Contact lenses, hearing aids and dentures may not be worn into surgery.  Do not bring valuables to the hospital. [facility] is not responsible for any missing/lost belongings or valuables.   Bring your BI-PAP to the hospital   Notify your doctor if there is any change in your medical condition (cold, fever, infection).  Wear comfortable clothing (specific to your surgery type) to the hospital.  After surgery, you can help prevent lung complications  by doing breathing exercises.  Take deep breaths and cough every 1-2 hours. Your doctor may order a device called an Incentive Spirometer to help you take deep breaths. When coughing or sneezing, hold a pillow firmly against your incision with both hands. This is called "splinting." Doing this helps protect your incision. It also  decreases belly discomfort.  If you are being admitted to the hospital overnight, leave your suitcase in the car. After surgery it may be brought to your room.  In case of increased patient census, it may be necessary for you, the patient, to continue your postoperative care in the Same Day Surgery department.  If you are being discharged the day of surgery, you will not be allowed to drive home. You will need a responsible individual to drive you home and stay with you for 24 hours after surgery.   If you are taking public transportation, you will need to have a responsible individual with you.  Please call the Pre-admissions Testing Dept. at [phone] if you have any questions about these instructions.  Surgery Visitation Policy:  Patients having surgery or a procedure may have two visitors.  Children under the age of 9 must have an adult with them who is not the patient.

## 2023-07-22 DIAGNOSIS — E1122 Type 2 diabetes mellitus with diabetic chronic kidney disease: Secondary | ICD-10-CM | POA: Diagnosis not present

## 2023-07-22 DIAGNOSIS — I25119 Atherosclerotic heart disease of native coronary artery with unspecified angina pectoris: Secondary | ICD-10-CM | POA: Diagnosis not present

## 2023-07-22 DIAGNOSIS — I480 Paroxysmal atrial fibrillation: Secondary | ICD-10-CM | POA: Diagnosis not present

## 2023-07-22 DIAGNOSIS — I10 Essential (primary) hypertension: Secondary | ICD-10-CM | POA: Diagnosis not present

## 2023-07-22 DIAGNOSIS — N184 Chronic kidney disease, stage 4 (severe): Secondary | ICD-10-CM | POA: Diagnosis not present

## 2023-07-22 DIAGNOSIS — E7800 Pure hypercholesterolemia, unspecified: Secondary | ICD-10-CM | POA: Diagnosis not present

## 2023-07-22 DIAGNOSIS — C50412 Malignant neoplasm of upper-outer quadrant of left female breast: Secondary | ICD-10-CM | POA: Diagnosis not present

## 2023-07-22 DIAGNOSIS — E669 Obesity, unspecified: Secondary | ICD-10-CM | POA: Diagnosis not present

## 2023-07-23 ENCOUNTER — Encounter: Payer: Self-pay | Admitting: Orthopedic Surgery

## 2023-07-23 NOTE — Progress Notes (Signed)
 Perioperative Services  Pre-Admission/Anesthesia Testing Clinical Review  Date: 07/23/23  Patient Demographics:  Name: Bonnie Holt DOB:   Oct 17, 1945 MRN:   [id number]  Planned Surgical Procedure(s):    Case: [id number] Date/Time: 07/26/23 1522   Procedure: MANIPULATION, KNEE, CLOSED (Right: Knee)   Anesthesia type: Choice   Diagnosis:      S/P TKR (total knee replacement), right [Z96.651]     Generalized weakness [R53.1]     Chronic pain of right knee [M25.561, G89.29]     Joint stiffness of knee, right [M25.661]   Pre-op diagnosis:      S/P TKR total knee replacement, right Z96.651     Generalized weakness R53.1     Chronic pain of right knee M25.561, G89.29     Joint stiffness of knee, right M25.661   Location: [facility] OR ROOM 01 / [facility]   Surgeons: Arlyne Lame, MD     NOTE: Available PAT nursing documentation and vital signs have been reviewed. Clinical nursing staff has updated patient's PMH/PSHx, current medication list, and drug allergies/intolerances to ensure comprehensive history available to assist in medical decision making as it pertains to the aforementioned surgical procedure and anticipated anesthetic course. Extensive review of available clinical information performed. [place] PMH and PSHx updated with any diagnoses/procedures that  may have been inadvertently omitted during her intake with the pre-admission testing department's nursing staff.  Clinical Discussion:  Bonnie Holt is a 78 y.o. female who is submitted for pre-surgical anesthesia review and clearance prior to her undergoing the above procedure. Patient has never been a smoker. Pertinent PMH includes: CAD, PAF, LBBB, bradycardia, HFpEF, HTN, HLD, T2DM, CKD-IV, dyspnea, OSAH (requires nocturnal PAP therapy), OA, cervicalgia, LEFT breast cancer (s/p XRT).  Patient is followed by cardiology Beau Bound, MD). She was last seen in the cardiology clinic on 02/22/2023; notes  reviewed.  At the time of her clinic visit, patient doing well overall from a cardiovascular perspective.  Patient reporting stable peripheral edema and episodes of bradycardia. She denied any episodes of chest pain, shortness breath, PND, orthopnea, palpitations, vertiginous symptoms, or presyncope/syncope.  Patient with a past medical history significant for cardiovascular diagnoses. Documented physical exam was grossly benign, providing no evidence of acute exacerbation and/or decompensation of the patient's known cardiovascular conditions. Of note, patient has received care in [place] .  Records regarding remote cardiovascular care unavailable at time of consult.  Information gathered from patient and notes from local care teams.  Patient underwent diagnostic left heart catheterization in 2012 that revealed obstructive CAD.  She subsequently underwent PCI with BMS placement x 1 (unknown type/location).  Most recent myocardial perfusion imaging study was performed on 07/01/2017 revealing a normal left ventricular systolic function with an EF of 53%. There was evidence of a borderline anterior apical septal defect related to known LBBB. No artifact or left ventricular cavity size enlargement appreciated on review of imaging. TID ratio = 1.17. Study interpreted as intermediate risk.  Last TTE was performed on 08/25/2017 revealing a low normal left ventricular systolic function with mild LVH; LVEF 50-55%.  Study was reported to be very limited due to poor sound wave transmission, patient noncompliance, and tachycardia.  Left ventricular function not well seen, however appears preserved.  Valves poorly visualized.  Previous TTE from 06/28/2017 reviewed.  EF preserved at 50%.  There was trivial to mild mitral, tricuspid, and pulmonary valve regurgitation.  There is mild LAE and LVH.  There was no significant transvalvular gradient suggestive  of stenosis.  Patient with an atrial fibrillation diagnosis;  CHA2DS2-VASc Score = 6 (age x 2, sex, HTN, vascular disease, T2DM). Her rate and rhythm care currently being maintained on oral metoprolol  succinate. She is chronically anticoagulated using standard dose apixaban ; reported to be compliant with therapy with no evidence or reports of GI bleeding.  Blood pressure reasonably controlled at 124/68 mmHg on currently prescribed ARB (losartan (, beta-blocker (metoprolol  succinate), and diuretic (torsemide ) therapies. She is on atorvastatin  for her HLD diagnosis and further ASCVD prevention. T2DM loosely controlled on prescribed regimen; last HgbA1c was 8.0% when checked on 06/03/2021.  Of note, since patient was last seen by cardiology, she has had her hemoglobin A1c rechecked with improvement to 6.3% when checked on 05/24/2023. Patient has an OSAH diagnosis and is reported to be compliant with her prescribed nocturnal PAP therapy.  Functional capacity limited by arthritides. Patient is able to complete all of her ADLs/IADLs without cardiovascular limitation.  Per the [facility]ASI,patient felt to be able to achieve at least 4 METS of activity without angina/anginal equivalent symptoms.  No changes were made to her medication regimen.  Patient to follow-up with outpatient cardiology in 6 months or sooner if needed.  Sal Crass is scheduled for an elective MANIPULATION, KNEE, CLOSED (Right: Knee) on 07/26/2023 with Dr. Alveria Johann, MD. Given patient's past medical history significant for cardiovascular diagnoses, presurgical cardiac clearance was sought by the  PAT team. Per cardiology, "this patient is optimized for surgery and may proceed with the planned procedural course with a LOW risk of significant perioperative cardiovascular complications".    Again, this patient is on daily anticoagulation therapy.  She has been instructed on recommendations from her cardiologist for holding her apixaban  for 3 days prior to her procedure with plans to restart as soon as postoperative  bleeding risk felt to be minimized by her primary attending surgeon.  The patient is aware that her last dose of apixaban  should be on 05/24/2023.  Patient denies previous perioperative complications with anesthesia in the past. In review of the available records, it is noted that patient underwent a neuraxial anesthetic course here at [facility] ([facility]) in 05/2023 without documented complications.      07/21/2023   10:00 AM 05/29/2023    7:28 AM 05/29/2023    3:47 AM  Vitals with BMI  Height 5\' 2"     Weight 205 lbs    BMI 37.49    Systolic  121 104  Diastolic  43 42  Pulse  55 63   Providers/Specialists:   NOTE: Primary physician provider listed below. Patient may have been seen by APP or partner within same practice.   PROVIDER ROLE / SPECIALTY LAST OV  Hooten, Robbie Chiles, MD Orthopedics (Surgeon) 07/16/2023  Jimmy Moulding, MD Primary Care Provider 07/22/2023  Thais Fill, MD Cardiology 02/22/2023  Lannette Pitter, MD Medical Oncology 03/26/2022  Glenis Langdon, MD Radiation Oncology 12/22/2021  Rica Chalet, MD Nephrology 01/27/2023   Allergies:  Levofloxacin, Ace inhibitors, and Warfarin  Current Home Medications:   No current facility-administered medications for this encounter.    acetaminophen  (TYLENOL ) 650 MG CR tablet   apixaban  (ELIQUIS ) 5 MG TABS tablet   atorvastatin  (LIPITOR ) 80 MG tablet   Biotin 5000 MCG CAPS   Calcium -Vitamin D -Vitamin K (CALCIUM  + D PO)   Cholecalciferol  (VITAMIN D3) 50 MCG (2000 UT) capsule   CINNAMON PO   clobetasol cream (TEMOVATE) 0.05 %   Coenzyme Q10 (CO Q-10) 100  MG CAPS   Flaxseed, Linseed, (FLAXSEED OIL PO)   glipiZIDE  (GLUCOTROL  XL) 2.5 MG 24 hr tablet   losartan  (COZAAR ) 25 MG tablet   metoprolol  succinate (TOPROL -XL) 50 MG 24 hr tablet   Multiple Vitamin (MULTIVITAMIN WITH MINERALS) TABS tablet   Semaglutide 14 MG TABS   torsemide  (DEMADEX ) 20 MG tablet   traMADol  (ULTRAM )  50 MG tablet   Continuous Blood Gluc Sensor (FREESTYLE LIBRE 14 DAY SENSOR) MISC   oxyCODONE  (ROXICODONE ) 5 MG immediate release tablet   Sennosides 25 MG TABS   History:   Past Medical History:  Diagnosis Date   (HFpEF) heart failure with preserved ejection fraction (HCC) 06/28/2017   a.) TTE 06/28/2017: EF 50%, mild LVH, mild LAE, triv PR, mild MR/TR, G1DD.   Aortic atherosclerosis (HCC)    Arthritis    Arthrofibrosis of total knee replacement (HCC)    ASCUS with positive high risk HPV cervical    Basal cell carcinoma of skin    Bradycardia    Breast cancer, left (HCC) 11/19/2016   a.) stage IA (cT1b, cN0, cM0, G2, ER+, PR+, HER2-); OncotypeDx score = 6; s/p lumpectomy 12/16/2016 + adjuvant XRT (completed 03/20/2017) + 5 years endocrine (letrozole ) therapy (completes 03/2022)   Cervicalgia    Chronic ischemic heart disease    CKD (chronic kidney disease), stage IV (HCC)    Coronary artery disease    a.) LHC/PCI in 2012 in ; BMS x 1 placed (unknown type/location)   Dermatomycosis    Dyspnea    HLD (hyperlipidemia)    Hypertension    Left bundle branch block (LBBB)    Long term current use of aromatase inhibitor    a.) letrozole ; completed 5 years of therapy in 03/2022   On apixaban  therapy    OSA treated with BiPAP    Osteopenia    PAF (paroxysmal atrial fibrillation) (HCC)    a.) CHA2DS2-VASc = 6 (age x2, sex, HTN, vascular disease, T2DM) as of 07/23/2023; b.) rate/rhythm maintained on oral metoprolol  succinate; chronically anticoagulated using standard dose apixaban    Personal history of radiation therapy    a.) for LEFT breast cancer; completed 03/2017   Tick fever    Type 2 diabetes mellitus treated with insulin  (HCC)    a.) has CGM in place   Urinary incontinence    Past Surgical History:  Procedure Laterality Date   BREAST BIOPSY Left 11/19/2016   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   BREAST BIOPSY Left 11/19/2016   wing clip 2:30 4 cmfn Invasive mammary carcinoma    BREAST BIOPSY Left 11/19/2016   ribbon clip 3:00 6cmfn Invasive mammary carcinoma   BREAST BIOPSY Left 12/03/2016   LN biopsy, negative   BREAST BIOPSY Left 11/17/2017   affirm bx of calcs, x clip -FIBROSIS WITH DYSTROPHIC CALCIFICATIONS   BREAST LUMPECTOMY Left 12/16/2016   excision of all 3 sites, LN negative   CORONARY ANGIOPLASTY WITH STENT PLACEMENT Left 2012   [employer]   DIALYSIS/PERMA CATHETER INSERTION N/A 09/09/2017   Procedure: DIALYSIS/PERMA CATHETER INSERTION;  Surgeon: Celso College, MD;  Location: [facility] INVASIVE CV LAB;  Service: Cardiovascular;  Laterality: N/A;   DIALYSIS/PERMA CATHETER REMOVAL N/A 10/04/2017   Procedure: DIALYSIS/PERMA CATHETER REMOVAL;  Surgeon: Celso College, MD;  Location: [facility];  Service: Cardiovascular;  Laterality: N/A;   ESOPHAGOGASTRODUODENOSCOPY (EGD) WITH PROPOFOL  N/A 09/07/2017   Procedure: ESOPHAGOGASTRODUODENOSCOPY (EGD) WITH PROPOFOL ;  Surgeon: Marnee Sink, MD;  Location: [facility] ENDOSCOPY;  Service: Endoscopy;  Laterality:  N/A;   KNEE ARTHROPLASTY Left 09/12/2021   Procedure: COMPUTER ASSISTED TOTAL KNEE ARTHROPLASTY;  Surgeon: Arlyne Lame, MD;  Location: [facility] ORS;  Service: Orthopedics;  Laterality: Left;   KNEE ARTHROPLASTY Right 05/28/2023   Procedure: COMPUTER ASSISTED TOTAL KNEE ARTHROPLASTY - RNFA;  Surgeon: Arlyne Lame, MD;  Location: [facility] ORS;  Service: Orthopedics;  Laterality: Right;   PARTIAL MASTECTOMY WITH NEEDLE LOCALIZATION Left 12/16/2016   Procedure: PARTIAL MASTECTOMY WITH NEEDLE LOCALIZATION;  Surgeon: Eldred Grego, MD;  Location: [facility] ORS;  Service: General;  Laterality: Left;   SENTINEL NODE BIOPSY Left 12/16/2016   Procedure: SENTINEL NODE BIOPSY;  Surgeon: Eldred Grego, MD;  Location: [facility] ORS;  Service: General;  Laterality: Left;   Family History  Problem Relation Age of Onset   Brain cancer Father    Diabetes Father    Hypertension Brother    Heart Problems Maternal Aunt     Diabetes Paternal Aunt    Heart Problems Maternal Grandmother    Dementia Paternal Grandmother    Heart Problems Brother    Hypertension Brother    Asthma Maternal Aunt    Arthritis Maternal Aunt    Diabetes Paternal Aunt    Cancer Paternal Uncle    Breast cancer Neg Hx    Social History   Tobacco Use   Smoking status: Never   Smokeless tobacco: Never  Vaping Use   Vaping status: Never Used  Substance Use Topics   Alcohol use: Yes    Comment: socially -  2 per year   Drug use: No    Pertinent Clinical Results:  LABS:  Lab Results  Component Value Date   WBC 6.7 05/24/2023   HGB 13.4 05/24/2023   HCT 39.7 05/24/2023   MCV 96.6 05/24/2023   PLT 174 05/24/2023   Lab Results  Component Value Date   NA 140 05/24/2023   CL 108 05/24/2023   K 3.6 05/24/2023   CO2 24 05/24/2023   BUN 37 (H) 05/24/2023   CREATININE 2.43 (H) 05/24/2023   GFRNONAA 20 (L) 05/24/2023   CALCIUM  9.6 05/24/2023   PHOS 4.3 09/14/2017   ALBUMIN  3.9 05/24/2023   GLUCOSE 120 (H) 05/24/2023   Lab Results  Component Value Date   HGBA1C 6.3 (H) 05/24/2023    ECG: Date: 05/24/2023 Rate: 61 bpm Rhythm: NSR; nonspecific intraventricular block Axis (leads I and aVF): Left axis deviation Intervals: PR 190 ms. QRS 162 ms. QTc 535 ms. ST segment and T wave changes: No evidence of acute ST segment elevation or depression.  Evidence of an age undetermined lateral infarct present. Comparison: Previous tracing on 09/12/2021 showed wide QRS rhythm at rate of 44 bpm; LBBB present.    IMAGING / PROCEDURES: DIAGNOSTIC RADIOGRAPHS OF RIGHT KNEE 3 VIEWS performed on 07/16/2023 Good position of the total knee implants.  Good alignment is noted on the AP view.  Good cement mantle is appreciated without evidence of loosening.  No evidence of polyethylene wear or osteolysis. No evidence of fracture or dislocation.     MYOCARDIAL PERFUSION IMAGING STUDY (LEXISCAN ) performed on 07/01/2017 Normal left  ventricular systolic function with EF of 53% Normal myocardial thickening and wall motion No artifact Left ventricular cavity size normal SPECT images demonstrate a small perfusion abnormality of mild intensity present in the anterior apical septal region on stress images possibly related to LBBB Borderline myocardial perfusion scan due to abnormal ECG changes at rest  TRANSTHORACIC ECHOCARDIOGRAM performed on 06/28/2017 Low normal left ventricular systolic function  with an EF of 50% Mild LVH Diastolic Doppler parameters consistent with abnormal relaxation (G1DD). Mild LAE Mild mitral and tricuspid valve regurgitation Trivial pulmonary valve regurgitation No aortic valve regurgitation Normal gradients; no valvular stenosis No pericardial effusion  Impression and Plan:  Bonnie Holt has been referred for pre-anesthesia review and clearance prior to her undergoing the planned anesthetic and procedural courses. Available labs, pertinent testing, and imaging results were personally reviewed by me in preparation for upcoming operative/procedural course. [facility] medical record has been updated following extensive record review and patient interview with PAT staff.    This patient has been appropriately cleared by cardiology with an overall LOW risk of experiencing significant perioperative cardiovascular complications. Based on clinical review performed today (07/23/2023), barring any significant acute changes in the patient's overall condition, it is anticipated that she will be able to proceed with the planned surgical intervention. Any acute changes in clinical condition may necessitate her procedure being postponed and/or cancelled. Patient will meet with anesthesia team (MD and/or CRNA) on the day of her procedure for preoperative evaluation/assessment. Questions regarding anesthetic course will be fielded at that time.    Pre-surgical instructions were reviewed with the patient during  his PAT appointment, and questions were fielded to satisfaction by PAT clinical staff. She has been instructed on which medications that he will need to hold prior to surgery, as well as the ones that have been deemed safe/appropriate to take on the day of his procedure. As part of the general education provided by PAT, patient made aware both verbally and in writing, that she would need to abstain from the use of any illegal substances during her perioperative course. She was advised that failure to follow the provided instructions could necessitate case cancellation or result in serious perioperative complications up to and including death. Patient encouraged to contact PAT and/or her surgeon's office to discuss any questions or concerns that may arise prior to surgery; verbalized understanding.   Renate Caroline, MSN, APRN, FNP-C, CEN [facility]  Peri-operative Services Nurse Practitioner Phone: [phone] Fax: [phone] 07/23/23 7:38 AM  NOTE: This note has been prepared using Dragon dictation software. Despite my best ability to proofread, there is always the potential that unintentional transcriptional errors may still occur from this process.

## 2023-07-26 ENCOUNTER — Encounter: Payer: Self-pay | Admitting: Orthopedic Surgery

## 2023-07-26 ENCOUNTER — Other Ambulatory Visit: Payer: Self-pay

## 2023-07-26 ENCOUNTER — Ambulatory Visit: Payer: Self-pay | Admitting: Urgent Care

## 2023-07-26 ENCOUNTER — Ambulatory Visit
Admission: RE | Admit: 2023-07-26 | Discharge: 2023-07-26 | Disposition: A | Discharge status: Home / Self Care | Attending: Orthopedic Surgery | Admitting: Orthopedic Surgery

## 2023-07-26 ENCOUNTER — Encounter
Admission: RE | Disposition: A | Discharge status: Home / Self Care | Payer: Self-pay | Source: Home / Self Care | Attending: Orthopedic Surgery

## 2023-07-26 DIAGNOSIS — E1122 Type 2 diabetes mellitus with diabetic chronic kidney disease: Secondary | ICD-10-CM | POA: Insufficient documentation

## 2023-07-26 DIAGNOSIS — I480 Paroxysmal atrial fibrillation: Secondary | ICD-10-CM | POA: Diagnosis not present

## 2023-07-26 DIAGNOSIS — G4733 Obstructive sleep apnea (adult) (pediatric): Secondary | ICD-10-CM | POA: Insufficient documentation

## 2023-07-26 DIAGNOSIS — Z7984 Long term (current) use of oral hypoglycemic drugs: Secondary | ICD-10-CM | POA: Insufficient documentation

## 2023-07-26 DIAGNOSIS — I447 Left bundle-branch block, unspecified: Secondary | ICD-10-CM | POA: Insufficient documentation

## 2023-07-26 DIAGNOSIS — Z96651 Presence of right artificial knee joint: Secondary | ICD-10-CM | POA: Diagnosis not present

## 2023-07-26 DIAGNOSIS — Z923 Personal history of irradiation: Secondary | ICD-10-CM | POA: Diagnosis not present

## 2023-07-26 DIAGNOSIS — Z853 Personal history of malignant neoplasm of breast: Secondary | ICD-10-CM | POA: Insufficient documentation

## 2023-07-26 DIAGNOSIS — M24661 Ankylosis, right knee: Secondary | ICD-10-CM | POA: Diagnosis not present

## 2023-07-26 DIAGNOSIS — I2510 Atherosclerotic heart disease of native coronary artery without angina pectoris: Secondary | ICD-10-CM | POA: Insufficient documentation

## 2023-07-26 DIAGNOSIS — I132 Hypertensive heart and chronic kidney disease with heart failure and with stage 5 chronic kidney disease, or end stage renal disease: Secondary | ICD-10-CM | POA: Insufficient documentation

## 2023-07-26 DIAGNOSIS — M25661 Stiffness of right knee, not elsewhere classified: Secondary | ICD-10-CM | POA: Diagnosis not present

## 2023-07-26 DIAGNOSIS — Z01818 Encounter for other preprocedural examination: Secondary | ICD-10-CM

## 2023-07-26 DIAGNOSIS — E785 Hyperlipidemia, unspecified: Secondary | ICD-10-CM | POA: Diagnosis not present

## 2023-07-26 DIAGNOSIS — N184 Chronic kidney disease, stage 4 (severe): Secondary | ICD-10-CM | POA: Diagnosis not present

## 2023-07-26 DIAGNOSIS — I5032 Chronic diastolic (congestive) heart failure: Secondary | ICD-10-CM | POA: Insufficient documentation

## 2023-07-26 HISTORY — DX: Atherosclerosis of aorta: I70.0

## 2023-07-26 HISTORY — PX: KNEE CLOSED REDUCTION: SHX995

## 2023-07-26 LAB — GLUCOSE, CAPILLARY
Glucose-Capillary: 135 mg/dL — ABNORMAL HIGH (ref 70–99)
Glucose-Capillary: 132 mg/dL — ABNORMAL HIGH (ref 70–99)

## 2023-07-26 SURGERY — MANIPULATION, KNEE, CLOSED
Anesthesia: General | Site: Knee | Laterality: Right

## 2023-07-26 MED ORDER — FENTANYL CITRATE (PF) 100 MCG/2ML IJ SOLN
INTRAMUSCULAR | Status: DC | PRN
  Administered 2023-07-26: 50 ug via INTRAVENOUS

## 2023-07-26 MED ORDER — CHLORHEXIDINE GLUCONATE 4 % EX SOLN: 60.0000 mL | Freq: Once | CUTANEOUS | Status: DC

## 2023-07-26 MED ORDER — CHLORHEXIDINE GLUCONATE 0.12 % MT SOLN
15.0000 mL | Freq: Once | OROMUCOSAL | Status: AC
  Administered 2023-07-26: 15 mL via OROMUCOSAL

## 2023-07-26 MED ORDER — OXYCODONE HCL 5 MG PO TABS: 5.0000 mg | ORAL_TABLET | ORAL | 0 refills | Status: AC | PRN

## 2023-07-26 MED ORDER — LIDOCAINE HCL (CARDIAC) PF 100 MG/5ML IV SOSY
PREFILLED_SYRINGE | INTRAVENOUS | Status: DC | PRN
  Administered 2023-07-26: 40 mg via INTRAVENOUS

## 2023-07-26 MED ORDER — ACETAMINOPHEN 10 MG/ML IV SOLN
INTRAVENOUS | Status: DC | PRN
  Administered 2023-07-26: 1000 mg via INTRAVENOUS

## 2023-07-26 MED ORDER — FENTANYL CITRATE (PF) 100 MCG/2ML IJ SOLN: 25.0000 ug | INTRAMUSCULAR | Status: DC | PRN

## 2023-07-26 MED ORDER — CHLORHEXIDINE GLUCONATE 0.12 % MT SOLN
OROMUCOSAL | Status: AC
  Filled 2023-07-26: qty 15

## 2023-07-26 MED ORDER — OXYCODONE HCL 5 MG PO TABS
5.0000 mg | ORAL_TABLET | Freq: Once | ORAL | Status: AC
  Administered 2023-07-26: 5 mg via ORAL

## 2023-07-26 MED ORDER — ACETAMINOPHEN 10 MG/ML IV SOLN
INTRAVENOUS | Status: AC
  Filled 2023-07-26: qty 100

## 2023-07-26 MED ORDER — FENTANYL CITRATE (PF) 100 MCG/2ML IJ SOLN
INTRAMUSCULAR | Status: AC
  Filled 2023-07-26: qty 2

## 2023-07-26 MED ORDER — ORAL CARE MOUTH RINSE: 15.0000 mL | Freq: Once | OROMUCOSAL | Status: AC

## 2023-07-26 MED ORDER — DEXAMETHASONE SODIUM PHOSPHATE 10 MG/ML IJ SOLN
8.0000 mg | Freq: Once | INTRAMUSCULAR | Status: AC
  Administered 2023-07-26: 8 mg via INTRAVENOUS

## 2023-07-26 MED ORDER — SODIUM CHLORIDE 0.9 % IV SOLN: INTRAVENOUS | Status: DC

## 2023-07-26 MED ORDER — DEXAMETHASONE SODIUM PHOSPHATE 10 MG/ML IJ SOLN
INTRAMUSCULAR | Status: AC
  Filled 2023-07-26: qty 1

## 2023-07-26 MED ORDER — OXYCODONE HCL 5 MG PO TABS
ORAL_TABLET | ORAL | Status: AC
  Filled 2023-07-26: qty 1

## 2023-07-26 MED ORDER — PROPOFOL 500 MG/50ML IV EMUL
INTRAVENOUS | Status: DC | PRN
  Administered 2023-07-26 (×4): 50 mg via INTRAVENOUS

## 2023-07-26 SURGICAL SUPPLY — 1 items: KIT TURNOVER KIT A (KITS) ×1 IMPLANT

## 2023-07-26 NOTE — Transfer of Care (Signed)
 Immediate Anesthesia Transfer of Care Note  Patient: Bonnie Holt  Procedure(s) Performed: MANIPULATION, KNEE, CLOSED (Right: Knee)  Patient Location: PACU  Anesthesia Type:MAC  Level of Consciousness: awake  Airway & Oxygen Therapy: Patient Spontanous Breathing  Post-op Assessment: Report given to RN and Post -op Vital signs reviewed and stable  Post vital signs: Reviewed and stable  Last Vitals:  Vitals Value Taken Time  BP 141/47 07/26/23 1545  Temp    Pulse 62 07/26/23 1552  Resp 12 07/26/23 1552  SpO2 96 % 07/26/23 1552  Vitals shown include unfiled device data.  Last Pain:  Vitals:   07/26/23 1405  TempSrc: Tympanic  PainSc: 0-No pain         Complications: No notable events documented.

## 2023-07-26 NOTE — Interval H&P Note (Signed)
 History and Physical Interval Note:  07/26/2023 2:55 PM  Bonnie Holt  has presented today for surgery, with the diagnosis of S/P TKR total knee replacement, right Z96.651 Generalized weakness R53.1 Chronic pain of right knee M25.561, G89.29 Joint stiffness of knee, right M25.661.  The various methods of treatment have been discussed with the patient and family. After consideration of risks, benefits and other options for treatment, the patient has consented to  Procedure(s): MANIPULATION, KNEE, CLOSED (Right) as a surgical intervention.  The patient's history has been reviewed, patient examined, no change in status, stable for surgery.  I have reviewed the patient's chart and labs.  Questions were answered to the patient's satisfaction.     Bonnie Holt P Beuna Bolding

## 2023-07-26 NOTE — Op Note (Signed)
 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   ASSISTANT: None  ANESTHESIA: general  ESTIMATED BLOOD LOSS: None  FLUIDS REPLACED: 300 mL of crystalloid  INDICATIONS FOR SURGERY: Bonnie Holt is a 78 y.o. year old female who previously underwent right total knee arthroplasty.  Despite aggressive physical therapy, the patient demonstrated poor progress with her range of motion and was felt to have arthrofibrosis of the right total knee arthroplasty.. After discussion of the risks and benefits of surgical intervention, the patient expressed understanding of the risks benefits and agree with plans for manipulation of the right knee under anesthesia.   PROCEDURE IN DETAIL: The patient was brought into the operating room and, after adequate general anesthesia was achieved, the patient's knee was flexed.  Premanipulation measurements were obtained using a goniometer and demonstrated flexion of 87 degrees.  I placed my here on the lateral aspect of the patient's right knee and my shoulder on the upper portion of the tibia.  With gentle direct pressure to the tibia, the knee was gradually flexed with audible lysis of adhesions.  Pressure was continued until there was no longer any audible lysis of adhesions.  Postmanipulation  measurements were obtained and flexion of 127 degrees was achieved.  The patient tolerated the procedure well.  She was transported to the recovery room in stable condition.   Avonna Iribe P. Sasan Wilkie, [name] M.D.

## 2023-07-26 NOTE — Anesthesia Preprocedure Evaluation (Signed)
 Anesthesia Evaluation  Patient identified by MRN, date of birth, ID band Patient awake    Reviewed: Allergy & Precautions, H&P , NPO status , Patient's Chart, lab work & pertinent test results, reviewed documented beta blocker date and time   History of Anesthesia Complications Negative for: history of anesthetic complications  Airway Mallampati: II   Neck ROM: full    Dental  (+) Poor Dentition, Dental Advidsory Given Permanent bridge on the top front:   Pulmonary neg shortness of breath, sleep apnea and Continuous Positive Airway Pressure Ventilation , neg COPD, neg recent URI   Pulmonary exam normal        Cardiovascular Exercise Tolerance: Poor hypertension, On Medications (-) angina + CAD, + Cardiac Stents and +CHF  (-) Past MI + dysrhythmias Atrial Fibrillation (-) Valvular Problems/Murmurs Rhythm:regular Rate:Normal     Neuro/Psych negative neurological ROS  negative psych ROS   GI/Hepatic Neg liver ROS, PUD,,,  Endo/Other  diabetes, Well Controlled, Type 2, Oral Hypoglycemic Agents    Renal/GU Renal disease  negative genitourinary   Musculoskeletal   Abdominal   Peds  Hematology negative hematology ROS (+)   Anesthesia Other Findings Past Medical History: No date: Arthritis No date: ASCUS with positive high risk HPV cervical No date: Basal cell carcinoma of skin No date: Bradycardia 11/19/2016: Breast cancer, left (HCC)     Comment:  a.) stage IA (cT1b, cN0, cM0, G2, ER+, PR+, HER2-);               OncotypeDx score = 6; s/p lumpectomy 12/16/2016 +               adjuvant XRT (completed 03/20/2017) + 5 years endocrine               (letrozole ) therapy (completes 03/2022) No date: Chronic ischemic heart disease No date: CKD (chronic kidney disease), stage IV (HCC) No date: Coronary artery disease     Comment:  a.) LHC/PCI in 2012 in [place]; BMS x 1 placed (unknown               type/location) 06/28/2017:  Diastolic dysfunction     Comment:  a.) TTE 06/28/2017: EF 50%, mild LVH, mild LAE, triv PR,              mild MR/TR, G1DD. No date: HLD (hyperlipidemia) No date: Hypertension No date: Left bundle branch block (LBBB) No date: Long term current use of anticoagulant     Comment:  a.) apixaban  No date: Long term current use of aromatase inhibitor     Comment:  a.) letrozole ; completes 5 years of therapy in 03/2022 No date: OSA treated with BiPAP No date: Osteopenia No date: PAF (paroxysmal atrial fibrillation) (HCC)     Comment:  a.) CHA2DS2-VASc = 5 (age x2, sex, HTN, T2DM); b.)               rate/rhythm maintained on oral amiodarone  + metoprolol                succinate; chronically anticoagulated using standard dose              apixaban  No date: Personal history of radiation therapy     Comment:  a.) for LEFT breast cancer; completed 03/2017 No date: Tick fever No date: Type 2 diabetes mellitus treated with insulin  (HCC)     Comment:  a.) has CGM in place Past Surgical History: 11/19/2016: BREAST BIOPSY; Left     Comment:  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 11/19/2016: BREAST BIOPSY; Left     Comment:  wing clip 2:30 4 cmfn Invasive mammary carcinoma 11/19/2016: BREAST BIOPSY; Left     Comment:  ribbon clip 3:00 6cmfn Invasive mammary carcinoma 12/03/2016: BREAST BIOPSY; Left     Comment:  LN biopsy, negative 11/17/2017: BREAST BIOPSY; Left     Comment:  affirm bx of calcs, x clip -FIBROSIS WITH DYSTROPHIC               CALCIFICATIONS 12/16/2016: BREAST LUMPECTOMY; Left     Comment:  excision of all 3 sites, LN negative 2012: CORONARY ANGIOPLASTY WITH STENT PLACEMENT; Left     Comment:  [employer] 09/09/2017: DIALYSIS/PERMA CATHETER INSERTION; N/A     Comment:  Procedure: DIALYSIS/PERMA CATHETER INSERTION;  Surgeon:               Celso College, MD;  Location: [facility];                Service: Cardiovascular;  Laterality: N/A; 10/04/2017: DIALYSIS/PERMA CATHETER  REMOVAL; N/A     Comment:  Procedure: DIALYSIS/PERMA CATHETER REMOVAL;  Surgeon:               Celso College, MD;  Location: [facility];                Service: Cardiovascular;  Laterality: N/A; 09/07/2017: ESOPHAGOGASTRODUODENOSCOPY (EGD) WITH PROPOFOL ; N/A     Comment:  Procedure: ESOPHAGOGASTRODUODENOSCOPY (EGD) WITH               PROPOFOL ;  Surgeon: Marnee Sink, MD;  Location: [facility]               ENDOSCOPY;  Service: Endoscopy;  Laterality: N/A; 12/16/2016: PARTIAL MASTECTOMY WITH NEEDLE LOCALIZATION; Left     Comment:  Procedure: PARTIAL MASTECTOMY WITH NEEDLE LOCALIZATION;               Surgeon: Eldred Grego, MD;  Location: [facility] OR[facility];               Service: General;  Laterality: Left; 12/16/2016: SENTINEL NODE BIOPSY; Left     Comment:  Procedure: SENTINEL NODE BIOPSY;  Surgeon: Eldred Grego, MD;  Location: [facility] OR[facility];  Service: General;                Laterality: Left; BMI    Body Mass Index: 38.23 kg/m     Reproductive/Obstetrics negative OB ROS                             Anesthesia Physical Anesthesia Plan  ASA: 3  Anesthesia Plan: General   Post-op Pain Management:    Induction: Intravenous  PONV Risk Score and Plan: 3 and Propofol  infusion, TIVA and Treatment may vary due to age or medical condition  Airway Management Planned: Natural Airway and Simple Face Mask  Additional Equipment:   Intra-op Plan:   Post-operative Plan:   Informed Consent: I have reviewed the patients History and Physical, chart, labs and discussed the procedure including the risks, benefits and alternatives for the proposed anesthesia with the patient or authorized representative who has indicated his/her understanding and acceptance.     Dental Advisory Given  Plan Discussed with: CRNA  Anesthesia Plan Comments:         Anesthesia Quick Evaluation

## 2023-07-27 ENCOUNTER — Encounter: Payer: Self-pay | Admitting: Orthopedic Surgery

## 2023-07-27 DIAGNOSIS — Z96651 Presence of right artificial knee joint: Secondary | ICD-10-CM | POA: Diagnosis not present

## 2023-07-27 DIAGNOSIS — M25561 Pain in right knee: Secondary | ICD-10-CM | POA: Diagnosis not present

## 2023-07-27 DIAGNOSIS — R531 Weakness: Secondary | ICD-10-CM | POA: Diagnosis not present

## 2023-07-27 DIAGNOSIS — G8929 Other chronic pain: Secondary | ICD-10-CM | POA: Diagnosis not present

## 2023-07-27 DIAGNOSIS — M25661 Stiffness of right knee, not elsewhere classified: Secondary | ICD-10-CM | POA: Diagnosis not present

## 2023-07-29 DIAGNOSIS — E1129 Type 2 diabetes mellitus with other diabetic kidney complication: Secondary | ICD-10-CM | POA: Diagnosis not present

## 2023-07-29 DIAGNOSIS — M25561 Pain in right knee: Secondary | ICD-10-CM | POA: Diagnosis not present

## 2023-07-29 DIAGNOSIS — R600 Localized edema: Secondary | ICD-10-CM | POA: Diagnosis not present

## 2023-07-29 DIAGNOSIS — N2581 Secondary hyperparathyroidism of renal origin: Secondary | ICD-10-CM | POA: Diagnosis not present

## 2023-07-29 DIAGNOSIS — N1832 Chronic kidney disease, stage 3b: Secondary | ICD-10-CM | POA: Diagnosis not present

## 2023-07-29 DIAGNOSIS — I10 Essential (primary) hypertension: Secondary | ICD-10-CM | POA: Diagnosis not present

## 2023-07-30 DIAGNOSIS — Z96651 Presence of right artificial knee joint: Secondary | ICD-10-CM | POA: Diagnosis not present

## 2023-07-30 DIAGNOSIS — M25561 Pain in right knee: Secondary | ICD-10-CM | POA: Diagnosis not present

## 2023-08-02 DIAGNOSIS — Z96651 Presence of right artificial knee joint: Secondary | ICD-10-CM | POA: Diagnosis not present

## 2023-08-02 DIAGNOSIS — N184 Chronic kidney disease, stage 4 (severe): Secondary | ICD-10-CM | POA: Diagnosis not present

## 2023-08-02 DIAGNOSIS — G8929 Other chronic pain: Secondary | ICD-10-CM | POA: Diagnosis not present

## 2023-08-02 DIAGNOSIS — E1129 Type 2 diabetes mellitus with other diabetic kidney complication: Secondary | ICD-10-CM | POA: Diagnosis not present

## 2023-08-02 DIAGNOSIS — I10 Essential (primary) hypertension: Secondary | ICD-10-CM | POA: Diagnosis not present

## 2023-08-02 DIAGNOSIS — M25661 Stiffness of right knee, not elsewhere classified: Secondary | ICD-10-CM | POA: Diagnosis not present

## 2023-08-02 DIAGNOSIS — M25561 Pain in right knee: Secondary | ICD-10-CM | POA: Diagnosis not present

## 2023-08-02 DIAGNOSIS — R600 Localized edema: Secondary | ICD-10-CM | POA: Diagnosis not present

## 2023-08-02 DIAGNOSIS — N2581 Secondary hyperparathyroidism of renal origin: Secondary | ICD-10-CM | POA: Diagnosis not present

## 2023-08-02 DIAGNOSIS — R531 Weakness: Secondary | ICD-10-CM | POA: Diagnosis not present

## 2023-08-04 DIAGNOSIS — Z96651 Presence of right artificial knee joint: Secondary | ICD-10-CM | POA: Diagnosis not present

## 2023-08-04 DIAGNOSIS — M25661 Stiffness of right knee, not elsewhere classified: Secondary | ICD-10-CM | POA: Diagnosis not present

## 2023-08-04 DIAGNOSIS — R531 Weakness: Secondary | ICD-10-CM | POA: Diagnosis not present

## 2023-08-04 DIAGNOSIS — G8929 Other chronic pain: Secondary | ICD-10-CM | POA: Diagnosis not present

## 2023-08-04 DIAGNOSIS — M25561 Pain in right knee: Secondary | ICD-10-CM | POA: Diagnosis not present

## 2023-08-06 DIAGNOSIS — G8929 Other chronic pain: Secondary | ICD-10-CM | POA: Diagnosis not present

## 2023-08-06 DIAGNOSIS — M25561 Pain in right knee: Secondary | ICD-10-CM | POA: Diagnosis not present

## 2023-08-06 DIAGNOSIS — Z96651 Presence of right artificial knee joint: Secondary | ICD-10-CM | POA: Diagnosis not present

## 2023-08-06 DIAGNOSIS — R531 Weakness: Secondary | ICD-10-CM | POA: Diagnosis not present

## 2023-08-06 DIAGNOSIS — M25661 Stiffness of right knee, not elsewhere classified: Secondary | ICD-10-CM | POA: Diagnosis not present

## 2023-08-11 DIAGNOSIS — Z96651 Presence of right artificial knee joint: Secondary | ICD-10-CM | POA: Diagnosis not present

## 2023-08-11 DIAGNOSIS — M25661 Stiffness of right knee, not elsewhere classified: Secondary | ICD-10-CM | POA: Diagnosis not present

## 2023-08-13 DIAGNOSIS — M25661 Stiffness of right knee, not elsewhere classified: Secondary | ICD-10-CM | POA: Diagnosis not present

## 2023-08-13 DIAGNOSIS — Z96651 Presence of right artificial knee joint: Secondary | ICD-10-CM | POA: Diagnosis not present

## 2023-08-13 DIAGNOSIS — G4733 Obstructive sleep apnea (adult) (pediatric): Secondary | ICD-10-CM | POA: Diagnosis not present

## 2023-08-13 DIAGNOSIS — I10 Essential (primary) hypertension: Secondary | ICD-10-CM | POA: Diagnosis not present

## 2023-08-15 NOTE — Anesthesia Postprocedure Evaluation (Signed)
 Anesthesia Post Note  Patient: Bonnie Holt  Procedure(s) Performed: MANIPULATION, KNEE, CLOSED (Right: Knee)  Patient location during evaluation: PACU Anesthesia Type: General Level of consciousness: awake and alert Pain management: pain level controlled Vital Signs Assessment: post-procedure vital signs reviewed and stable Respiratory status: spontaneous breathing, nonlabored ventilation, respiratory function stable and patient connected to nasal cannula oxygen Cardiovascular status: blood pressure returned to baseline and stable Postop Assessment: no apparent nausea or vomiting Anesthetic complications: no   No notable events documented.   Last Vitals:  Vitals:   07/26/23 1615 07/26/23 1637  BP: (!) 160/69 (!) 169/61  Pulse: 60 (!) 56  Resp: 18 17  Temp: (!) 36.4 C (!) 36.2 C  SpO2: 97% 98%    Last Pain:  Vitals:   07/26/23 1637  TempSrc: Temporal  PainSc: 0-No pain                 Vanice Genre

## 2023-08-16 DIAGNOSIS — Z96651 Presence of right artificial knee joint: Secondary | ICD-10-CM | POA: Diagnosis not present

## 2023-08-16 DIAGNOSIS — R531 Weakness: Secondary | ICD-10-CM | POA: Diagnosis not present

## 2023-08-16 DIAGNOSIS — G8929 Other chronic pain: Secondary | ICD-10-CM | POA: Diagnosis not present

## 2023-08-16 DIAGNOSIS — M25661 Stiffness of right knee, not elsewhere classified: Secondary | ICD-10-CM | POA: Diagnosis not present

## 2023-08-16 DIAGNOSIS — M25561 Pain in right knee: Secondary | ICD-10-CM | POA: Diagnosis not present

## 2023-08-18 DIAGNOSIS — G8929 Other chronic pain: Secondary | ICD-10-CM | POA: Diagnosis not present

## 2023-08-18 DIAGNOSIS — Z96651 Presence of right artificial knee joint: Secondary | ICD-10-CM | POA: Diagnosis not present

## 2023-08-18 DIAGNOSIS — R531 Weakness: Secondary | ICD-10-CM | POA: Diagnosis not present

## 2023-08-18 DIAGNOSIS — M25661 Stiffness of right knee, not elsewhere classified: Secondary | ICD-10-CM | POA: Diagnosis not present

## 2023-08-18 DIAGNOSIS — M25561 Pain in right knee: Secondary | ICD-10-CM | POA: Diagnosis not present

## 2023-08-20 DIAGNOSIS — M25661 Stiffness of right knee, not elsewhere classified: Secondary | ICD-10-CM | POA: Diagnosis not present

## 2023-08-20 DIAGNOSIS — Z96651 Presence of right artificial knee joint: Secondary | ICD-10-CM | POA: Diagnosis not present

## 2023-08-20 DIAGNOSIS — G8929 Other chronic pain: Secondary | ICD-10-CM | POA: Diagnosis not present

## 2023-08-20 DIAGNOSIS — M25561 Pain in right knee: Secondary | ICD-10-CM | POA: Diagnosis not present

## 2023-08-20 DIAGNOSIS — R531 Weakness: Secondary | ICD-10-CM | POA: Diagnosis not present

## 2023-08-24 DIAGNOSIS — M25661 Stiffness of right knee, not elsewhere classified: Secondary | ICD-10-CM | POA: Diagnosis not present

## 2023-08-24 DIAGNOSIS — Z96651 Presence of right artificial knee joint: Secondary | ICD-10-CM | POA: Diagnosis not present

## 2023-08-26 DIAGNOSIS — Z96651 Presence of right artificial knee joint: Secondary | ICD-10-CM | POA: Diagnosis not present

## 2023-08-26 DIAGNOSIS — M25661 Stiffness of right knee, not elsewhere classified: Secondary | ICD-10-CM | POA: Diagnosis not present

## 2023-09-02 DIAGNOSIS — M25661 Stiffness of right knee, not elsewhere classified: Secondary | ICD-10-CM | POA: Diagnosis not present

## 2023-09-02 DIAGNOSIS — Z96651 Presence of right artificial knee joint: Secondary | ICD-10-CM | POA: Diagnosis not present

## 2023-09-02 DIAGNOSIS — I10 Essential (primary) hypertension: Secondary | ICD-10-CM | POA: Diagnosis not present

## 2023-09-02 DIAGNOSIS — E782 Mixed hyperlipidemia: Secondary | ICD-10-CM | POA: Diagnosis not present

## 2023-09-02 DIAGNOSIS — E119 Type 2 diabetes mellitus without complications: Secondary | ICD-10-CM | POA: Diagnosis not present

## 2023-09-02 DIAGNOSIS — E66812 Obesity, class 2: Secondary | ICD-10-CM | POA: Diagnosis not present

## 2023-09-02 DIAGNOSIS — G4733 Obstructive sleep apnea (adult) (pediatric): Secondary | ICD-10-CM | POA: Diagnosis not present

## 2023-09-02 DIAGNOSIS — Z794 Long term (current) use of insulin: Secondary | ICD-10-CM | POA: Diagnosis not present

## 2023-09-02 DIAGNOSIS — R001 Bradycardia, unspecified: Secondary | ICD-10-CM | POA: Diagnosis not present

## 2023-09-02 DIAGNOSIS — Z79899 Other long term (current) drug therapy: Secondary | ICD-10-CM | POA: Diagnosis not present

## 2023-09-02 DIAGNOSIS — I2510 Atherosclerotic heart disease of native coronary artery without angina pectoris: Secondary | ICD-10-CM | POA: Diagnosis not present

## 2023-09-02 DIAGNOSIS — I480 Paroxysmal atrial fibrillation: Secondary | ICD-10-CM | POA: Diagnosis not present

## 2023-09-07 DIAGNOSIS — G8929 Other chronic pain: Secondary | ICD-10-CM | POA: Diagnosis not present

## 2023-09-07 DIAGNOSIS — Z96651 Presence of right artificial knee joint: Secondary | ICD-10-CM | POA: Diagnosis not present

## 2023-09-07 DIAGNOSIS — Z9889 Other specified postprocedural states: Secondary | ICD-10-CM | POA: Diagnosis not present

## 2023-09-07 DIAGNOSIS — M25661 Stiffness of right knee, not elsewhere classified: Secondary | ICD-10-CM | POA: Diagnosis not present

## 2023-09-07 DIAGNOSIS — M25561 Pain in right knee: Secondary | ICD-10-CM | POA: Diagnosis not present

## 2023-09-07 DIAGNOSIS — R531 Weakness: Secondary | ICD-10-CM | POA: Diagnosis not present

## 2023-09-13 DIAGNOSIS — I10 Essential (primary) hypertension: Secondary | ICD-10-CM | POA: Diagnosis not present

## 2023-09-13 DIAGNOSIS — G4733 Obstructive sleep apnea (adult) (pediatric): Secondary | ICD-10-CM | POA: Diagnosis not present

## 2023-10-13 DIAGNOSIS — G4733 Obstructive sleep apnea (adult) (pediatric): Secondary | ICD-10-CM | POA: Diagnosis not present

## 2023-10-13 DIAGNOSIS — I10 Essential (primary) hypertension: Secondary | ICD-10-CM | POA: Diagnosis not present

## 2023-10-14 DIAGNOSIS — D2261 Melanocytic nevi of right upper limb, including shoulder: Secondary | ICD-10-CM | POA: Diagnosis not present

## 2023-10-14 DIAGNOSIS — L538 Other specified erythematous conditions: Secondary | ICD-10-CM | POA: Diagnosis not present

## 2023-10-14 DIAGNOSIS — B078 Other viral warts: Secondary | ICD-10-CM | POA: Diagnosis not present

## 2023-10-14 DIAGNOSIS — D225 Melanocytic nevi of trunk: Secondary | ICD-10-CM | POA: Diagnosis not present

## 2023-10-14 DIAGNOSIS — Z85828 Personal history of other malignant neoplasm of skin: Secondary | ICD-10-CM | POA: Diagnosis not present

## 2023-10-14 DIAGNOSIS — D2272 Melanocytic nevi of left lower limb, including hip: Secondary | ICD-10-CM | POA: Diagnosis not present

## 2023-10-14 DIAGNOSIS — L2989 Other pruritus: Secondary | ICD-10-CM | POA: Diagnosis not present

## 2023-10-14 DIAGNOSIS — L570 Actinic keratosis: Secondary | ICD-10-CM | POA: Diagnosis not present

## 2023-10-14 DIAGNOSIS — D2262 Melanocytic nevi of left upper limb, including shoulder: Secondary | ICD-10-CM | POA: Diagnosis not present

## 2023-10-14 DIAGNOSIS — R238 Other skin changes: Secondary | ICD-10-CM | POA: Diagnosis not present

## 2023-11-03 DIAGNOSIS — H02831 Dermatochalasis of right upper eyelid: Secondary | ICD-10-CM | POA: Diagnosis not present

## 2023-11-03 DIAGNOSIS — H26493 Other secondary cataract, bilateral: Secondary | ICD-10-CM | POA: Diagnosis not present

## 2023-11-03 DIAGNOSIS — H35033 Hypertensive retinopathy, bilateral: Secondary | ICD-10-CM | POA: Diagnosis not present

## 2023-11-03 DIAGNOSIS — H524 Presbyopia: Secondary | ICD-10-CM | POA: Diagnosis not present

## 2023-11-03 DIAGNOSIS — I10 Essential (primary) hypertension: Secondary | ICD-10-CM | POA: Diagnosis not present

## 2023-11-03 DIAGNOSIS — E119 Type 2 diabetes mellitus without complications: Secondary | ICD-10-CM | POA: Diagnosis not present

## 2023-11-03 DIAGNOSIS — H02834 Dermatochalasis of left upper eyelid: Secondary | ICD-10-CM | POA: Diagnosis not present

## 2023-11-03 DIAGNOSIS — H18832 Recurrent erosion of cornea, left eye: Secondary | ICD-10-CM | POA: Diagnosis not present

## 2023-11-12 DIAGNOSIS — G4733 Obstructive sleep apnea (adult) (pediatric): Secondary | ICD-10-CM | POA: Diagnosis not present

## 2023-11-12 DIAGNOSIS — I10 Essential (primary) hypertension: Secondary | ICD-10-CM | POA: Diagnosis not present

## 2023-11-25 DIAGNOSIS — H18832 Recurrent erosion of cornea, left eye: Secondary | ICD-10-CM | POA: Diagnosis not present

## 2023-12-13 DIAGNOSIS — I10 Essential (primary) hypertension: Secondary | ICD-10-CM | POA: Diagnosis not present

## 2023-12-13 DIAGNOSIS — G4733 Obstructive sleep apnea (adult) (pediatric): Secondary | ICD-10-CM | POA: Diagnosis not present

## 2023-12-16 DIAGNOSIS — Z9889 Other specified postprocedural states: Secondary | ICD-10-CM | POA: Diagnosis not present

## 2023-12-16 DIAGNOSIS — Z96651 Presence of right artificial knee joint: Secondary | ICD-10-CM | POA: Diagnosis not present

## 2023-12-23 DIAGNOSIS — R600 Localized edema: Secondary | ICD-10-CM | POA: Diagnosis not present

## 2023-12-23 DIAGNOSIS — N184 Chronic kidney disease, stage 4 (severe): Secondary | ICD-10-CM | POA: Diagnosis not present

## 2023-12-23 DIAGNOSIS — I10 Essential (primary) hypertension: Secondary | ICD-10-CM | POA: Diagnosis not present

## 2023-12-23 DIAGNOSIS — E1129 Type 2 diabetes mellitus with other diabetic kidney complication: Secondary | ICD-10-CM | POA: Diagnosis not present

## 2023-12-23 DIAGNOSIS — N2581 Secondary hyperparathyroidism of renal origin: Secondary | ICD-10-CM | POA: Diagnosis not present

## 2023-12-29 DIAGNOSIS — I10 Essential (primary) hypertension: Secondary | ICD-10-CM | POA: Diagnosis not present

## 2023-12-29 DIAGNOSIS — E1122 Type 2 diabetes mellitus with diabetic chronic kidney disease: Secondary | ICD-10-CM | POA: Diagnosis not present

## 2023-12-29 DIAGNOSIS — N184 Chronic kidney disease, stage 4 (severe): Secondary | ICD-10-CM | POA: Diagnosis not present

## 2023-12-29 DIAGNOSIS — R600 Localized edema: Secondary | ICD-10-CM | POA: Diagnosis not present

## 2023-12-29 DIAGNOSIS — N189 Chronic kidney disease, unspecified: Secondary | ICD-10-CM | POA: Diagnosis not present

## 2023-12-29 DIAGNOSIS — N2581 Secondary hyperparathyroidism of renal origin: Secondary | ICD-10-CM | POA: Diagnosis not present

## 2023-12-30 ENCOUNTER — Ambulatory Visit
Admission: RE | Admit: 2023-12-30 | Discharge: 2023-12-30 | Disposition: A | Discharge status: Home / Self Care | Source: Ambulatory Visit | Attending: Certified Nurse Midwife | Admitting: Certified Nurse Midwife

## 2023-12-30 DIAGNOSIS — Z1231 Encounter for screening mammogram for malignant neoplasm of breast: Secondary | ICD-10-CM | POA: Diagnosis not present

## 2024-01-12 DIAGNOSIS — I10 Essential (primary) hypertension: Secondary | ICD-10-CM | POA: Diagnosis not present

## 2024-01-20 DIAGNOSIS — I25119 Atherosclerotic heart disease of native coronary artery with unspecified angina pectoris: Secondary | ICD-10-CM | POA: Diagnosis not present

## 2024-01-20 DIAGNOSIS — N184 Chronic kidney disease, stage 4 (severe): Secondary | ICD-10-CM | POA: Diagnosis not present

## 2024-01-20 DIAGNOSIS — I10 Essential (primary) hypertension: Secondary | ICD-10-CM | POA: Diagnosis not present

## 2024-01-20 DIAGNOSIS — E1122 Type 2 diabetes mellitus with diabetic chronic kidney disease: Secondary | ICD-10-CM | POA: Diagnosis not present

## 2024-01-20 DIAGNOSIS — E7800 Pure hypercholesterolemia, unspecified: Secondary | ICD-10-CM | POA: Diagnosis not present

## 2024-01-27 DIAGNOSIS — Z1211 Encounter for screening for malignant neoplasm of colon: Secondary | ICD-10-CM | POA: Diagnosis not present

## 2024-01-27 DIAGNOSIS — I25119 Atherosclerotic heart disease of native coronary artery with unspecified angina pectoris: Secondary | ICD-10-CM | POA: Diagnosis not present

## 2024-01-27 DIAGNOSIS — E7800 Pure hypercholesterolemia, unspecified: Secondary | ICD-10-CM | POA: Diagnosis not present

## 2024-01-27 DIAGNOSIS — Z1331 Encounter for screening for depression: Secondary | ICD-10-CM | POA: Diagnosis not present

## 2024-01-27 DIAGNOSIS — Z0000 Encounter for general adult medical examination without abnormal findings: Secondary | ICD-10-CM | POA: Diagnosis not present

## 2024-01-27 DIAGNOSIS — G4733 Obstructive sleep apnea (adult) (pediatric): Secondary | ICD-10-CM | POA: Diagnosis not present

## 2024-01-27 DIAGNOSIS — N184 Chronic kidney disease, stage 4 (severe): Secondary | ICD-10-CM | POA: Diagnosis not present

## 2024-01-27 DIAGNOSIS — Z23 Encounter for immunization: Secondary | ICD-10-CM | POA: Diagnosis not present

## 2024-01-27 DIAGNOSIS — I480 Paroxysmal atrial fibrillation: Secondary | ICD-10-CM | POA: Diagnosis not present

## 2024-01-27 DIAGNOSIS — E1122 Type 2 diabetes mellitus with diabetic chronic kidney disease: Secondary | ICD-10-CM | POA: Diagnosis not present

## 2024-02-11 DIAGNOSIS — I10 Essential (primary) hypertension: Secondary | ICD-10-CM | POA: Diagnosis not present
